# Patient Record
Sex: Female | Born: 1980
Health system: Southern US, Community
[De-identification: ages and names within clinical notes are randomized; demographics above are authoritative.]

## PROBLEM LIST (undated history)

## (undated) DIAGNOSIS — K439 Ventral hernia without obstruction or gangrene: Secondary | ICD-10-CM

## (undated) DIAGNOSIS — I219 Acute myocardial infarction, unspecified: Secondary | ICD-10-CM

## (undated) DIAGNOSIS — Z9109 Other allergy status, other than to drugs and biological substances: Secondary | ICD-10-CM

## (undated) DIAGNOSIS — M2550 Pain in unspecified joint: Secondary | ICD-10-CM

## (undated) DIAGNOSIS — R7303 Prediabetes: Secondary | ICD-10-CM

## (undated) DIAGNOSIS — G4730 Sleep apnea, unspecified: Secondary | ICD-10-CM

## (undated) DIAGNOSIS — R0602 Shortness of breath: Secondary | ICD-10-CM

## (undated) DIAGNOSIS — B019 Varicella without complication: Secondary | ICD-10-CM

## (undated) DIAGNOSIS — E282 Polycystic ovarian syndrome: Secondary | ICD-10-CM

## (undated) DIAGNOSIS — M549 Dorsalgia, unspecified: Secondary | ICD-10-CM

## (undated) DIAGNOSIS — K8020 Calculus of gallbladder without cholecystitis without obstruction: Secondary | ICD-10-CM

## (undated) DIAGNOSIS — I10 Essential (primary) hypertension: Secondary | ICD-10-CM

## (undated) DIAGNOSIS — Z8701 Personal history of pneumonia (recurrent): Secondary | ICD-10-CM

## (undated) DIAGNOSIS — J45909 Unspecified asthma, uncomplicated: Secondary | ICD-10-CM

## (undated) DIAGNOSIS — R600 Localized edema: Secondary | ICD-10-CM

## (undated) DIAGNOSIS — E559 Vitamin D deficiency, unspecified: Secondary | ICD-10-CM

## (undated) DIAGNOSIS — L732 Hidradenitis suppurativa: Secondary | ICD-10-CM

## (undated) DIAGNOSIS — T7840XA Allergy, unspecified, initial encounter: Secondary | ICD-10-CM

## (undated) DIAGNOSIS — G43909 Migraine, unspecified, not intractable, without status migrainosus: Secondary | ICD-10-CM

## (undated) HISTORY — DX: Shortness of breath: R06.02

## (undated) HISTORY — DX: Varicella without complication: B01.9

## (undated) HISTORY — DX: Pain in unspecified joint: M25.50

## (undated) HISTORY — DX: Essential (primary) hypertension: I10

## (undated) HISTORY — DX: Dorsalgia, unspecified: M54.9

## (undated) HISTORY — DX: Acute myocardial infarction, unspecified: I21.9

## (undated) HISTORY — DX: Hidradenitis suppurativa: L73.2

## (undated) HISTORY — DX: Allergy, unspecified, initial encounter: T78.40XA

## (undated) HISTORY — DX: Other allergy status, other than to drugs and biological substances: Z91.09

## (undated) HISTORY — DX: Ventral hernia without obstruction or gangrene: K43.9

## (undated) HISTORY — DX: Vitamin D deficiency, unspecified: E55.9

## (undated) HISTORY — DX: Calculus of gallbladder without cholecystitis without obstruction: K80.20

## (undated) HISTORY — DX: Localized edema: R60.0

---

## 1999-02-18 HISTORY — PX: BREAST BIOPSY: SHX20

## 2005-02-17 HISTORY — PX: CHOLECYSTECTOMY: SHX55

## 2012-04-27 ENCOUNTER — Encounter (HOSPITAL_COMMUNITY): Payer: Self-pay | Admitting: *Deleted

## 2012-04-27 ENCOUNTER — Emergency Department (HOSPITAL_COMMUNITY)
Admission: EM | Admit: 2012-04-27 | Discharge: 2012-04-28 | Disposition: A | Discharge status: Home / Self Care | Payer: BC Managed Care – PPO | Attending: Emergency Medicine | Admitting: Emergency Medicine

## 2012-04-27 DIAGNOSIS — Z8742 Personal history of other diseases of the female genital tract: Secondary | ICD-10-CM | POA: Insufficient documentation

## 2012-04-27 DIAGNOSIS — E860 Dehydration: Secondary | ICD-10-CM | POA: Insufficient documentation

## 2012-04-27 DIAGNOSIS — R197 Diarrhea, unspecified: Secondary | ICD-10-CM

## 2012-04-27 DIAGNOSIS — Z3202 Encounter for pregnancy test, result negative: Secondary | ICD-10-CM | POA: Insufficient documentation

## 2012-04-27 HISTORY — DX: Polycystic ovarian syndrome: E28.2

## 2012-04-27 LAB — COMPREHENSIVE METABOLIC PANEL
ALT: 27 U/L (ref 0–35)
AST: 22 U/L (ref 0–37)
Albumin: 3.6 g/dL (ref 3.5–5.2)
CO2: 24 mEq/L (ref 19–32)
Chloride: 100 mEq/L (ref 96–112)
Creatinine, Ser: 0.84 mg/dL (ref 0.50–1.10)
GFR calc non Af Amer: >90 mL/min (ref >90)
Sodium: 135 mEq/L (ref 135–145)
Total Bilirubin: 0.5 mg/dL (ref 0.3–1.2)

## 2012-04-27 LAB — CBC WITH DIFFERENTIAL/PLATELET
Basophils Relative: 0 % (ref 0–1)
Eosinophils Absolute: 0 10*3/uL (ref 0.0–0.7)
Eosinophils Relative: 0 % (ref 0–5)
Hemoglobin: 14.9 g/dL (ref 12.0–15.0)
Lymphs Abs: 1.7 10*3/uL (ref 0.7–4.0)
MCH: 27.7 pg (ref 26.0–34.0)
MCHC: 32.8 g/dL (ref 30.0–36.0)
MCV: 84.4 fL (ref 78.0–100.0)
Monocytes Relative: 10 % (ref 3–12)
RBC: 5.38 MIL/uL — ABNORMAL HIGH (ref 3.87–5.11)

## 2012-04-27 MED ORDER — ONDANSETRON HCL 4 MG/2ML IJ SOLN
4.0000 mg | Freq: Once | INTRAMUSCULAR | Status: AC
  Administered 2012-04-27: 4 mg via INTRAVENOUS
  Filled 2012-04-27: qty 2

## 2012-04-27 MED ORDER — LOPERAMIDE HCL 2 MG PO CAPS
2.0000 mg | ORAL_CAPSULE | Freq: Once | ORAL | Status: AC
  Administered 2012-04-27: 2 mg via ORAL
  Filled 2012-04-27: qty 1

## 2012-04-27 MED ORDER — SODIUM CHLORIDE 0.9 % IV BOLUS (SEPSIS)
1000.0000 mL | Freq: Once | INTRAVENOUS | Status: AC
  Administered 2012-04-27: 1000 mL via INTRAVENOUS

## 2012-04-27 NOTE — ED Notes (Signed)
Pt started with nausea/vomiting Sunday night; since then increased diarrhea and abd pain; no appetite

## 2012-04-27 NOTE — ED Provider Notes (Signed)
History     CSN: [id number]  Arrival date & time 04/27/12  2113   First MD Initiated Contact with Patient 04/27/12 2321      Chief Complaint  Patient presents with  . Diarrhea    (Consider location/radiation/quality/duration/timing/severity/associated sxs/prior treatment) HPI Hx per PT. Diarrhea x 2 days no appeitie,no ABD pain but has "uneasieness", no N/V, no F/C, no travel or rash, no known sick contacts, works at a call center.  MOD in severity.  PT worried she has food poisoning.  Did eat a sandwhich was mayo that she left out "too long" also ate some [employer].   Past Medical History  Diagnosis Date  . Polycystic ovarian syndrome     Past Surgical History  Procedure Laterality Date  . Cholecystectomy      No family history on file.  History  Substance Use Topics  . Smoking status: Never Smoker   . Smokeless tobacco: Not on file  . Alcohol Use: No    OB History   Grav Para Term Preterm Abortions TAB SAB Ect Mult Living                  Review of Systems  Constitutional: Negative for fever and chills.  HENT: Negative for neck pain and neck stiffness.   Eyes: Negative for pain.  Respiratory: Negative for shortness of breath.   Cardiovascular: Negative for chest pain.  Gastrointestinal: Positive for diarrhea. Negative for abdominal pain, blood in stool and abdominal distention.  Genitourinary: Negative for dysuria.  Musculoskeletal: Negative for back pain.  Skin: Negative for rash.  Neurological: Negative for headaches.  All other systems reviewed and are negative.    Allergies  Review of patient's allergies indicates no known allergies.  Home Medications  No current outpatient prescriptions on file.  BP 145/93  Pulse 100  Temp(Src) 99.1 F (37.3 C) (Oral)  Resp 20  SpO2 100%  Physical Exam  Constitutional: She is oriented to person, place, and time. She appears well-developed and well-nourished.  HENT:  Head: Normocephalic and atraumatic.   Mouth/Throat: No oropharyngeal exudate.  Mildly dry mm  Eyes: EOM are normal. Pupils are equal, round, and reactive to light. No scleral icterus.  Neck: Neck supple.  Cardiovascular: Regular rhythm and intact distal pulses.   Pulmonary/Chest: Effort normal. No respiratory distress.  Abdominal: Soft. Bowel sounds are normal. She exhibits no distension. There is no tenderness. There is no rebound and no guarding.  Musculoskeletal: Normal range of motion. She exhibits no edema and no tenderness.  Neurological: She is alert and oriented to person, place, and time. No cranial nerve deficit.  Skin: Skin is warm and dry.    ED Course  Procedures (including critical care time)  Results for orders placed during the hospital encounter of 04/27/12  COMPREHENSIVE METABOLIC PANEL      Result Value Range   Sodium 135  135 - 145 mEq/L   Potassium 3.6  3.5 - 5.1 mEq/L   Chloride 100  96 - 112 mEq/L   CO2 24  19 - 32 mEq/L   Glucose, Bld 97  70 - 99 mg/dL   BUN 10  6 - 23 mg/dL   Creatinine, Ser 1.30  0.50 - 1.10 mg/dL   Calcium 9.0  8.4 - 86.5 mg/dL   Total Protein 8.4 (*) 6.0 - 8.3 g/dL   Albumin 3.6  3.5 - 5.2 g/dL   AST 22  0 - 37 U/L   ALT 27  0 - 35  U/L   Alkaline Phosphatase 54  39 - 117 U/L   Total Bilirubin 0.5  0.3 - 1.2 mg/dL   GFR calc non Af Amer >90  >90 mL/min   GFR calc Af Amer >90  >90 mL/min  CBC WITH DIFFERENTIAL      Result Value Range   WBC 5.0  4.0 - 10.5 K/uL   RBC 5.38 (*) 3.87 - 5.11 MIL/uL   Hemoglobin 14.9  12.0 - 15.0 g/dL   HCT 16.1  09.6 - 04.5 %   MCV 84.4  78.0 - 100.0 fL   MCH 27.7  26.0 - 34.0 pg   MCHC 32.8  30.0 - 36.0 g/dL   RDW 40.9  81.1 - 91.4 %   Platelets 204  150 - 400 K/uL   Neutrophils Relative 56  43 - 77 %   Neutro Abs 2.8  1.7 - 7.7 K/uL   Lymphocytes Relative 33  12 - 46 %   Lymphs Abs 1.7  0.7 - 4.0 K/uL   Monocytes Relative 10  3 - 12 %   Monocytes Absolute 0.5  0.1 - 1.0 K/uL   Eosinophils Relative 0  0 - 5 %   Eosinophils  Absolute 0.0  0.0 - 0.7 K/uL   Basophils Relative 0  0 - 1 %   Basophils Absolute 0.0  0.0 - 0.1 K/uL   IVFs. IV zofran.  PO imodium.   PO challenge  1:58 AM feeling much better, drinking water and wants to go home. Plan outpatient follow up as needed. RX Imodium.  Return precautions provided. ABD exam unchanged  MDM  Diarrhea x 2 days.  No fevers or bloody stools  IVFs and medications provided, serial ABD exams benign  VS and nursing notes reviewed - condition improved     Sunnie Nielsen, MD 04/28/12 0202

## 2012-04-28 LAB — URINALYSIS, ROUTINE W REFLEX MICROSCOPIC
Glucose, UA: NEGATIVE mg/dL
Hgb urine dipstick: NEGATIVE
Ketones, ur: 40 mg/dL — AB
Protein, ur: 30 mg/dL — AB
pH: 5.5 (ref 5.0–8.0)

## 2012-04-28 LAB — URINE MICROSCOPIC-ADD ON

## 2012-04-28 MED ORDER — LOPERAMIDE HCL 2 MG PO CAPS: 2.0000 mg | ORAL_CAPSULE | Freq: Four times a day (QID) | ORAL | Status: DC | PRN

## 2012-06-14 DIAGNOSIS — N96 Recurrent pregnancy loss: Secondary | ICD-10-CM | POA: Insufficient documentation

## 2012-06-14 DIAGNOSIS — G43909 Migraine, unspecified, not intractable, without status migrainosus: Secondary | ICD-10-CM | POA: Insufficient documentation

## 2012-08-30 ENCOUNTER — Encounter (HOSPITAL_COMMUNITY): Payer: Self-pay | Admitting: Emergency Medicine

## 2012-08-30 ENCOUNTER — Emergency Department (HOSPITAL_COMMUNITY)
Admission: EM | Admit: 2012-08-30 | Discharge: 2012-08-30 | Disposition: A | Discharge status: Home / Self Care | Payer: BC Managed Care – PPO | Attending: Emergency Medicine | Admitting: Emergency Medicine

## 2012-08-30 DIAGNOSIS — Z8639 Personal history of other endocrine, nutritional and metabolic disease: Secondary | ICD-10-CM | POA: Insufficient documentation

## 2012-08-30 DIAGNOSIS — M545 Low back pain, unspecified: Secondary | ICD-10-CM | POA: Insufficient documentation

## 2012-08-30 DIAGNOSIS — Z862 Personal history of diseases of the blood and blood-forming organs and certain disorders involving the immune mechanism: Secondary | ICD-10-CM | POA: Insufficient documentation

## 2012-08-30 DIAGNOSIS — Z3202 Encounter for pregnancy test, result negative: Secondary | ICD-10-CM | POA: Insufficient documentation

## 2012-08-30 LAB — URINALYSIS, ROUTINE W REFLEX MICROSCOPIC
Bilirubin Urine: NEGATIVE
Glucose, UA: NEGATIVE mg/dL
Hgb urine dipstick: NEGATIVE
Ketones, ur: NEGATIVE mg/dL
Leukocytes, UA: NEGATIVE
Nitrite: NEGATIVE
Protein, ur: NEGATIVE mg/dL
Specific Gravity, Urine: 1.027 (ref 1.005–1.030)
Urobilinogen, UA: 0.2 mg/dL (ref 0.0–1.0)
pH: 6 (ref 5.0–8.0)

## 2012-08-30 LAB — POCT PREGNANCY, URINE: Preg Test, Ur: NEGATIVE

## 2012-08-30 MED ORDER — OXYCODONE-ACETAMINOPHEN 5-325 MG PO TABS: 1.0000 | ORAL_TABLET | Freq: Four times a day (QID) | ORAL | Status: DC | PRN

## 2012-08-30 MED ORDER — HYDROMORPHONE HCL PF 1 MG/ML IJ SOLN
1.0000 mg | Freq: Once | INTRAMUSCULAR | Status: DC
  Filled 2012-08-30: qty 1

## 2012-08-30 MED ORDER — IBUPROFEN 800 MG PO TABS: 800.0000 mg | ORAL_TABLET | Freq: Three times a day (TID) | ORAL | Status: DC

## 2012-08-30 MED ORDER — KETOROLAC TROMETHAMINE 60 MG/2ML IM SOLN
60.0000 mg | Freq: Once | INTRAMUSCULAR | Status: AC
  Administered 2012-08-30: 60 mg via INTRAMUSCULAR
  Filled 2012-08-30: qty 2

## 2012-08-30 NOTE — ED Notes (Signed)
Pt alert and mentating appropriately upon d/c. Pt given d/c teaching, prescriptions, and follow up care instructions. Pt verbalizes understanding of d/c teaching and has no further questions upon d/c. Pt ambulatory in room but requested wheelchair to be taken to car. Pt helped to car. Fayrene Helper, PA notified pt driving home and verifies pt stable since she was only given Toradol in ER . Pt notified not to drive when taking prescription pain medication after she fills prescription. Pt verbalizes understanding. NAD noted upon d/c.

## 2012-08-30 NOTE — ED Notes (Signed)
Pt states that she does not want to have an IV done because she does not have a ride home at this time. Pt states she would prefer to go home and try and control it there with prescription. Fayrene Helper, PA notified of pts choice.

## 2012-08-30 NOTE — ED Notes (Signed)
Pt. Stated, i have a problem with cyst on my ovaries and Its the same kind of pain it started yesterday.

## 2012-08-30 NOTE — ED Provider Notes (Signed)
History    CSN: [id number] Arrival date & time 08/30/12  5366  First MD Initiated Contact with Patient 08/30/12 [phone]     Chief Complaint  Patient presents with  . Back Pain   (Consider location/radiation/quality/duration/timing/severity/associated sxs/prior Treatment) HPI  32 year old female with history of polycystic ovarian syndrome presents complaining of back pain. Patient reports gradual onset of sharp stabbing pain to her right low back ongoing for the past 2 days. Pain is worsened with bending over, with movement and improves when she lays still. Pain is nonradiating. Pain felt very similar to prior ovarian cyst when it ruptured. States she usually has ruptured ovarian cyst once or twice yearly for the past 3 or 4 years.  This pain felt the same.  No complaints of fever, chills, headache, lightheadedness, dizziness, chest pain shortness of breath, abdominal pain, dysuria, hematuria, numbness, rash. Last menstrual period was earlier last month. Patient has no prior history of kidney stone. Denies any dysuria. Last sexual activity was 2 weeks ago.  Past Medical History  Diagnosis Date  . Polycystic ovarian syndrome    Past Surgical History  Procedure Laterality Date  . Cholecystectomy     No family history on file. History  Substance Use Topics  . Smoking status: Never Smoker   . Smokeless tobacco: Not on file  . Alcohol Use: No   OB History   Grav Para Term Preterm Abortions TAB SAB Ect Mult Living                 Review of Systems  Constitutional: Negative for fever.  Gastrointestinal: Negative for abdominal pain.  Genitourinary: Negative for flank pain.  Musculoskeletal: Positive for back pain.  Skin: Negative for rash and wound.  All other systems reviewed and are negative.    Allergies  Review of patient's allergies indicates no known allergies.  Home Medications   Current Outpatient Rx  Name  Route  Sig  Dispense  Refill  . albuterol (PROVENTIL  HFA;VENTOLIN HFA) 108 (90 BASE) MCG/ACT inhaler   Inhalation   Inhale 2 puffs into the lungs every 6 (six) hours as needed for wheezing.          BP 149/71  Pulse 85  Temp(Src) 98.2 F (36.8 C) (Oral)  Resp 17  Ht 5\' 5"  (1.651 m)  Wt 300 lb (136.079 kg)  BMI 49.92 kg/m2  SpO2 96%  LMP 07/18/2012 Physical Exam  Nursing note and vitals reviewed. Constitutional: She is oriented to person, place, and time. She appears well-developed and well-nourished. No distress.  Patient is morbidly obese  HENT:  Head: Normocephalic and atraumatic.  Eyes: Conjunctivae are normal.  Neck: Neck supple.  Abdominal: Soft. There is tenderness (Tenderness to right low back with palpation of abdomen, otherwise no significant focal point tenderness on abdominal exam).  Genitourinary:  No CVA tenderness   Neurological: She is alert and oriented to person, place, and time.  Skin: Skin is warm. No rash noted.  Psychiatric: She has a normal mood and affect.    ED Course  Procedures (including critical care time)  11:39 AM Pt with low back pain similar to ovarian cyst pain.  Pain is improving after receiving toradol.  preg neg, UA unremarkable.   I discussed care with attending and with patient.  Since pt felt pain is related to ovarian cyst, we will treat pain conservatively.  Pt discharge with pain meds.  However, if sxs worsen, pt has fever, n/v, or seeing blood in urine  then she should return promptly for further evaluation.  Pt agrees with plan.     Labs Reviewed  URINALYSIS, ROUTINE W REFLEX MICROSCOPIC - Abnormal; Notable for the following:    APPearance CLOUDY (*)    All other components within normal limits  POCT PREGNANCY, URINE   No results found. 1. Lower back pain     MDM  BP 111/90  Pulse 83  Temp(Src) 98.2 F (36.8 C) (Oral)  Resp 17  Ht 5\' 5"  (1.651 m)  Wt 300 lb (136.079 kg)  BMI 49.92 kg/m2  SpO2 100%  LMP 07/18/2012    Fayrene Helper, PA-C 08/30/12 1211  Fayrene Helper,  PA-C 08/30/12 1213

## 2012-08-31 NOTE — ED Provider Notes (Signed)
Medical screening examination/treatment/procedure(s) were performed by non-physician practitioner and as supervising physician I was immediately available for consultation/collaboration.  Toy Baker, MD 08/31/12 [phone]

## 2012-10-17 ENCOUNTER — Encounter (HOSPITAL_COMMUNITY): Payer: Self-pay | Admitting: Emergency Medicine

## 2012-10-17 ENCOUNTER — Emergency Department (HOSPITAL_COMMUNITY)
Admission: EM | Admit: 2012-10-17 | Discharge: 2012-10-17 | Disposition: A | Discharge status: Home / Self Care | Payer: BC Managed Care – PPO | Source: Home / Self Care

## 2012-10-17 DIAGNOSIS — N61 Mastitis without abscess: Secondary | ICD-10-CM

## 2012-10-17 DIAGNOSIS — B372 Candidiasis of skin and nail: Secondary | ICD-10-CM

## 2012-10-17 DIAGNOSIS — N611 Abscess of the breast and nipple: Secondary | ICD-10-CM

## 2012-10-17 MED ORDER — SULFAMETHOXAZOLE-TRIMETHOPRIM 800-160 MG PO TABS: 1.0000 | ORAL_TABLET | Freq: Two times a day (BID) | ORAL | Status: DC

## 2012-10-17 MED ORDER — NYSTATIN 100000 UNIT/GM EX POWD: CUTANEOUS | Status: DC

## 2012-10-17 NOTE — ED Provider Notes (Signed)
Medical screening examination/treatment/procedure(s) were performed by non-physician practitioner and as supervising physician I was immediately available for consultation/collaboration.  Leslee Home, M.D.  Reuben Likes, MD 10/17/12 [phone]

## 2012-10-17 NOTE — ED Notes (Signed)
C/o abscess under left breast that has busted and started with a clear mucous draining for a week now.   Antibiotic band aid was place on the abscess.

## 2012-10-17 NOTE — ED Provider Notes (Signed)
CSN: [id number]     Arrival date & time 10/17/12  1610 History   First MD Initiated Contact with Patient 10/17/12 1628     Chief Complaint  Patient presents with  . Abscess   (Consider location/radiation/quality/duration/timing/severity/associated sxs/prior Treatment) HPI Comments: 32 year old severely obese female presents with a persistent area of drainage in the intertriginous folds of the left breast. She states there was a small abscess-like structure discovered there approximately 2 weeks ago. After about one week spontaneously ruptured and has been draining ever since. She has been trying to cover the small opening with Band-Aids and paper towels. Denies systemic symptoms.   Past Medical History  Diagnosis Date  . Polycystic ovarian syndrome    Past Surgical History  Procedure Laterality Date  . Cholecystectomy     History reviewed. No pertinent family history. History  Substance Use Topics  . Smoking status: Never Smoker   . Smokeless tobacco: Not on file  . Alcohol Use: No   OB History   Grav Para Term Preterm Abortions TAB SAB Ect Mult Living                 Review of Systems  Skin:       As per history of present illness  All other systems reviewed and are negative.    Allergies  Review of patient's allergies indicates no known allergies.  Home Medications   Current Outpatient Rx  Name  Route  Sig  Dispense  Refill  . albuterol (PROVENTIL HFA;VENTOLIN HFA) 108 (90 BASE) MCG/ACT inhaler   Inhalation   Inhale 2 puffs into the lungs every 6 (six) hours as needed for wheezing.         Marland Kitchen ibuprofen (ADVIL,MOTRIN) 800 MG tablet   Oral   Take 1 tablet (800 mg total) by mouth 3 (three) times daily.   21 tablet   0   . oxyCODONE-acetaminophen (PERCOCET/ROXICET) 5-325 MG per tablet   Oral   Take 1-2 tablets by mouth every 6 (six) hours as needed for pain.   20 tablet   0    BP 136/88  Pulse 89  Temp(Src) 98.5 F (36.9 C) (Oral)  Resp 18  SpO2 100%   LMP 09/28/2012 Physical Exam  Nursing note and vitals reviewed. Constitutional: She is oriented to person, place, and time. She appears well-developed and well-nourished. No distress.  Neck: Normal range of motion. Neck supple.  Pulmonary/Chest: Effort normal.  Musculoskeletal: She exhibits no edema.  Neurological: She is alert and oriented to person, place, and time. She exhibits normal muscle tone.  Skin: Skin is warm and dry. No rash noted.  There is a 2 mm superficial skin opening in the mid intertriginous fold beneath the left breast. There is no apparent active draining. Expressing the edges a scant amount of tan fluid is seen. The area is mildly tender. No fluctuation, bulging of underlying tissues, induration or erythema. No signs of an abscess. There is also a slimy colorless female beneath the breast consistent with Candida.  Psychiatric: She has a normal mood and affect.    ED Course  Procedures (including critical care time) Labs Review Labs Reviewed - No data to display Imaging Review No results found.  MDM   1. Abscess of breast   2. Monilial intertrigo     : Nystatin powder apply to skin creases beneath the breasts bilaterally. Septra DS twice a day for 7 days Keep the area dry and place dry and sort of material over  the small area of drainage. Obtained culture. It was only a scant amount of drainage expressed and this may not be sufficient for culture. Recheck promptly for any new symptoms problems worsening or not improving or may call your PCP.   Hayden Rasmussen, NP 10/17/12 1721

## 2012-10-20 LAB — CULTURE, ROUTINE-ABSCESS: Gram Stain: NONE SEEN

## 2013-02-17 DIAGNOSIS — J189 Pneumonia, unspecified organism: Secondary | ICD-10-CM

## 2013-02-17 DIAGNOSIS — Z8679 Personal history of other diseases of the circulatory system: Secondary | ICD-10-CM

## 2013-02-17 HISTORY — DX: Personal history of other diseases of the circulatory system: Z86.79

## 2013-02-17 HISTORY — DX: Pneumonia, unspecified organism: J18.9

## 2013-03-06 ENCOUNTER — Emergency Department (HOSPITAL_BASED_OUTPATIENT_CLINIC_OR_DEPARTMENT_OTHER): Payer: BC Managed Care – PPO

## 2013-03-06 ENCOUNTER — Encounter (HOSPITAL_BASED_OUTPATIENT_CLINIC_OR_DEPARTMENT_OTHER): Payer: Self-pay | Admitting: Emergency Medicine

## 2013-03-06 ENCOUNTER — Emergency Department (HOSPITAL_BASED_OUTPATIENT_CLINIC_OR_DEPARTMENT_OTHER)
Admission: EM | Admit: 2013-03-06 | Discharge: 2013-03-07 | Disposition: A | Discharge status: Home / Self Care | Payer: BC Managed Care – PPO | Attending: Emergency Medicine | Admitting: Emergency Medicine

## 2013-03-06 DIAGNOSIS — Z792 Long term (current) use of antibiotics: Secondary | ICD-10-CM | POA: Insufficient documentation

## 2013-03-06 DIAGNOSIS — Z862 Personal history of diseases of the blood and blood-forming organs and certain disorders involving the immune mechanism: Secondary | ICD-10-CM | POA: Insufficient documentation

## 2013-03-06 DIAGNOSIS — Z8639 Personal history of other endocrine, nutritional and metabolic disease: Secondary | ICD-10-CM | POA: Insufficient documentation

## 2013-03-06 DIAGNOSIS — Z791 Long term (current) use of non-steroidal anti-inflammatories (NSAID): Secondary | ICD-10-CM | POA: Insufficient documentation

## 2013-03-06 DIAGNOSIS — J45901 Unspecified asthma with (acute) exacerbation: Secondary | ICD-10-CM

## 2013-03-06 HISTORY — DX: Unspecified asthma, uncomplicated: J45.909

## 2013-03-06 LAB — CBC WITH DIFFERENTIAL/PLATELET
BASOS PCT: 0 % (ref 0–1)
Basophils Absolute: 0 10*3/uL (ref 0.0–0.1)
EOS ABS: 0.1 10*3/uL (ref 0.0–0.7)
Eosinophils Relative: 1 % (ref 0–5)
HCT: 38.6 % (ref 36.0–46.0)
HEMOGLOBIN: 12.2 g/dL (ref 12.0–15.0)
Lymphocytes Relative: 27 % (ref 12–46)
Lymphs Abs: 2.3 10*3/uL (ref 0.7–4.0)
MCH: 26.8 pg (ref 26.0–34.0)
MCHC: 31.6 g/dL (ref 30.0–36.0)
MCV: 84.6 fL (ref 78.0–100.0)
MONOS PCT: 8 % (ref 3–12)
Monocytes Absolute: 0.7 10*3/uL (ref 0.1–1.0)
Neutro Abs: 5.4 10*3/uL (ref 1.7–7.7)
Neutrophils Relative %: 64 % (ref 43–77)
PLATELETS: 176 10*3/uL (ref 150–400)
RBC: 4.56 MIL/uL (ref 3.87–5.11)
RDW: 14 % (ref 11.5–15.5)
WBC: 8.5 10*3/uL (ref 4.0–10.5)

## 2013-03-06 LAB — BASIC METABOLIC PANEL
BUN: 10 mg/dL (ref 6–23)
CALCIUM: 9 mg/dL (ref 8.4–10.5)
CO2: 24 mEq/L (ref 19–32)
Chloride: 100 mEq/L (ref 96–112)
Creatinine, Ser: 0.8 mg/dL (ref 0.50–1.10)
GFR calc Af Amer: >90 mL/min (ref >90)
Glucose, Bld: 116 mg/dL — ABNORMAL HIGH (ref 70–99)
POTASSIUM: 3.7 meq/L (ref 3.7–5.3)
Sodium: 140 mEq/L (ref 137–147)

## 2013-03-06 LAB — D-DIMER, QUANTITATIVE: D-Dimer, Quant: 0.29 ug/mL-FEU (ref 0.00–0.48)

## 2013-03-06 LAB — PRO B NATRIURETIC PEPTIDE: Pro B Natriuretic peptide (BNP): 31.1 pg/mL (ref 0–125)

## 2013-03-06 MED ORDER — ALBUTEROL SULFATE (2.5 MG/3ML) 0.083% IN NEBU
5.0000 mg | INHALATION_SOLUTION | Freq: Once | RESPIRATORY_TRACT | Status: AC
  Administered 2013-03-06: 5 mg via RESPIRATORY_TRACT
  Filled 2013-03-06: qty 6

## 2013-03-06 MED ORDER — METHYLPREDNISOLONE SODIUM SUCC 125 MG IJ SOLR
125.0000 mg | Freq: Once | INTRAMUSCULAR | Status: AC
Start: 2013-03-06 — End: 2013-03-06
  Administered 2013-03-06: 125 mg via INTRAVENOUS
  Filled 2013-03-06: qty 2

## 2013-03-06 MED ORDER — ALBUTEROL SULFATE (2.5 MG/3ML) 0.083% IN NEBU
2.5000 mg | INHALATION_SOLUTION | Freq: Once | RESPIRATORY_TRACT | Status: AC
Start: 2013-03-06 — End: 2013-03-06
  Administered 2013-03-06: 2.5 mg via RESPIRATORY_TRACT
  Filled 2013-03-06: qty 3

## 2013-03-06 MED ORDER — PREDNISONE 10 MG PO TABS: 20.0000 mg | ORAL_TABLET | Freq: Every day | ORAL | Status: DC

## 2013-03-06 MED ORDER — FUROSEMIDE 10 MG/ML IJ SOLN
60.0000 mg | Freq: Once | INTRAMUSCULAR | Status: AC
  Administered 2013-03-06: 60 mg via INTRAVENOUS
  Filled 2013-03-06: qty 6

## 2013-03-06 MED ORDER — IPRATROPIUM-ALBUTEROL 0.5-2.5 (3) MG/3ML IN SOLN
3.0000 mL | Freq: Once | RESPIRATORY_TRACT | Status: AC
  Administered 2013-03-06: 3 mL via RESPIRATORY_TRACT
  Filled 2013-03-06: qty 3

## 2013-03-06 NOTE — Progress Notes (Signed)
Patient states that she is able to take a deeper breath post nebulizer treatment but not a full breath.

## 2013-03-06 NOTE — ED Provider Notes (Signed)
CSN: [id number]     Arrival date & time 03/06/13  1917 History   First MD Initiated Contact with Patient 03/06/13 2212    This chart was scribed for Melissa Pollack, MD by Lovena Le Day, ED scribe. This patient was seen in room MH06/MH06 and the patient's care was started at 2212.  Chief Complaint  Patient presents with  . Asthma   The history is provided by the patient. No language interpreter was used.   HPI Comments: Melissa Erickson is a 33 y.o. female who presents to the Emergency Department w/hx of asthma complaining of constant, gradually worsened coughing and wheezing, onset this PM. She reports previous flare ups of her asthma have not been this severe. She reports felt somewhat improved after breathing tx for a few minutes but began feeling more dyspneic again. She denies lower leg swelling or hx of blood clots. She has no menstrual cycles.   She has a hx of sleep apnea and polycystic ovarian disease.  Past Medical History  Diagnosis Date  . Polycystic ovarian syndrome   . Asthma    Past Surgical History  Procedure Laterality Date  . Cholecystectomy     History reviewed. No pertinent family history. History  Substance Use Topics  . Smoking status: Never Smoker   . Smokeless tobacco: Not on file  . Alcohol Use: No   OB History   Grav Para Term Preterm Abortions TAB SAB Ect Mult Living                 Review of Systems  Constitutional: Negative for fever and chills.  Respiratory: Positive for cough, shortness of breath and wheezing.   Cardiovascular: Negative for chest pain.  Gastrointestinal: Negative for abdominal pain.  Musculoskeletal: Negative for back pain.  All other systems reviewed and are negative.   A complete 10 system review of systems was obtained and all systems are negative except as noted in the HPI and PMH.   Allergies  Review of patient's allergies indicates no known allergies.  Home Medications   Current Outpatient Rx  Name  Route  Sig   Dispense  Refill  . albuterol (PROVENTIL HFA;VENTOLIN HFA) 108 (90 BASE) MCG/ACT inhaler   Inhalation   Inhale 2 puffs into the lungs every 6 (six) hours as needed for wheezing.         Marland Kitchen ibuprofen (ADVIL,MOTRIN) 800 MG tablet   Oral   Take 1 tablet (800 mg total) by mouth 3 (three) times daily.   21 tablet   0   . nystatin (MYCOSTATIN) powder      Apply between skin folds of breasts qid   30 g   0   . oxyCODONE-acetaminophen (PERCOCET/ROXICET) 5-325 MG per tablet   Oral   Take 1-2 tablets by mouth every 6 (six) hours as needed for pain.   20 tablet   0   . sulfamethoxazole-trimethoprim (SEPTRA DS) 800-160 MG per tablet   Oral   Take 1 tablet by mouth 2 (two) times daily. X 7 days   14 tablet   0    Triage Vitals: BP 144/99  Pulse 103  Temp(Src) 98.3 F (36.8 C) (Oral)  Resp 26  Ht 5\' 5"  (1.651 m)  Wt 390 lb (176.903 kg)  BMI 64.90 kg/m2  SpO2 99% Physical Exam  Nursing note and vitals reviewed. Constitutional: She is oriented to person, place, and time. She appears well-developed and well-nourished. No distress.  HENT:  Head: Normocephalic and atraumatic.  Eyes: Conjunctivae are normal. Right eye exhibits no discharge. Left eye exhibits no discharge.  Neck: Normal range of motion.  Cardiovascular: Normal rate.   Pulmonary/Chest: Effort normal. No respiratory distress.  Musculoskeletal: Normal range of motion. She exhibits no edema.  Neurological: She is alert and oriented to person, place, and time.  Skin: Skin is warm and dry.  Psychiatric: She has a normal mood and affect. Thought content normal.    ED Course  Procedures (including critical care time) DIAGNOSTIC STUDIES: Oxygen Saturation is 100% on room air, normal by my interpretation.    COORDINATION OF CARE: At 1005 PM Discussed treatment plan with patient which includes breathing tx, CXR, solu-medrol, blood work, EKG. Patient agrees.   Labs Review Labs Reviewed  PRO B NATRIURETIC PEPTIDE   D-DIMER, QUANTITATIVE  CBC WITH DIFFERENTIAL  BASIC METABOLIC PANEL   Imaging Review Dg Chest 2 View  03/06/2013   CLINICAL DATA:  Shortness of breath and wheezing. History of asthma.  EXAM: CHEST  2 VIEW  COMPARISON:  None.  FINDINGS: The lungs are well-aerated. Mild vascular congestion is noted, with question of minimal interstitial edema. There is no evidence of pleural effusion or pneumothorax.  The heart is borderline normal in size; the mediastinal contour is within normal limits. No acute osseous abnormalities are seen.  IMPRESSION: Mild vascular congestion, with question of minimal interstitial edema.   Electronically Signed   By: Garald Balding M.D.   On: 03/06/2013 21:20   EKG Interpretation   None      MDM  Patient improved here after steroids, nebs, and some lasix.  She is no longer wheezing.  I have discussed return precautions and she voices understanding.    Melissa Pollack, MD 03/06/13 [phone]

## 2013-03-06 NOTE — Discharge Instructions (Signed)
Asthma, Adult Asthma is a recurring condition in which the airways tighten and narrow. Asthma can make it difficult to breathe. It can cause coughing, wheezing, and shortness of breath. Asthma episodes (also called asthma attacks) range from minor to life-threatening. Asthma cannot be cured, but medicines and lifestyle changes can help control it. CAUSES Asthma is believed to be caused by inherited (genetic) and environmental factors, but its exact cause is unknown. Asthma may be triggered by allergens, lung infections, or irritants in the air. Asthma triggers are different for each person. Common triggers include:   Animal dander.  Dust mites.  Cockroaches.  Pollen from trees or grass.  Mold.  Smoke.  Air pollutants such as dust, household cleaners, hair sprays, aerosol sprays, paint fumes, strong chemicals, or strong odors.  Cold air, weather changes, and winds (which increase molds and pollens in the air).  Strong emotional expressions such as crying or laughing hard.  Stress.  Certain medicines (such as aspirin) or types of drugs (such as beta-blockers).  Sulfites in foods and drinks. Foods and drinks that may contain sulfites include dried fruit, potato chips, and sparkling grape juice.  Infections or inflammatory conditions such as the flu, a cold, or an inflammation of the nasal membranes (rhinitis).  Gastroesophageal reflux disease (GERD).  Exercise or strenuous activity. SYMPTOMS Symptoms may occur immediately after asthma is triggered or many hours later. Symptoms include:  Wheezing.  Excessive nighttime or early morning coughing.  Frequent or severe coughing with a common cold.  Chest tightness.  Shortness of breath. DIAGNOSIS  The diagnosis of asthma is made by a review of your medical history and a physical exam. Tests may also be performed. These may include:  Lung function studies. These tests show how much air you breath in and out.  Allergy  tests.  Imaging tests such as X-rays. TREATMENT  Asthma cannot be cured, but it can usually be controlled. Treatment involves identifying and avoiding your asthma triggers. It also involves medicines. There are 2 classes of medicine used for asthma treatment:   Controller medicines. These prevent asthma symptoms from occurring. They are usually taken every day.  Reliever or rescue medicines. These quickly relieve asthma symptoms. They are used as needed and provide short-term relief. Your health care provider will help you create an asthma action plan. An asthma action plan is a written plan for managing and treating your asthma attacks. It includes a list of your asthma triggers and how they may be avoided. It also includes information on when medicines should be taken and when their dosage should be changed. An action plan may also involve the use of a device called a peak flow meter. A peak flow meter measures how well the lungs are working. It helps you monitor your condition. HOME CARE INSTRUCTIONS   Take medicine as directed by your health care provider. Speak with your health care provider if you have questions about how or when to take the medicines.  Use a peak flow meter as directed by your health care provider. Record and keep track of readings.  Understand and use the action plan to help minimize or stop an asthma attack without needing to seek medical care.  Control your home environment in the following ways to help prevent asthma attacks:  Do not smoke. Avoid being exposed to secondhand smoke.  Change your heating and air conditioning filter regularly.  Limit your use of fireplaces and wood stoves.  Get rid of pests (such as roaches and   mice) and their droppings.  Throw away plants if you see mold on them.  Clean your floors and dust regularly. Use unscented cleaning products.  Try to have someone else vacuum for you regularly. Stay out of rooms while they are being  vacuumed and for a short while afterward. If you vacuum, use a dust mask from a hardware store, a double-layered or microfilter vacuum cleaner bag, or a vacuum cleaner with a HEPA filter.  Replace carpet with wood, tile, or vinyl flooring. Carpet can trap dander and dust.  Use allergy-proof pillows, mattress covers, and box spring covers.  Wash bed sheets and blankets every week in hot water and dry them in a dryer.  Use blankets that are made of polyester or cotton.  Clean bathrooms and kitchens with bleach. If possible, have someone repaint the walls in these rooms with mold-resistant paint. Keep out of the rooms that are being cleaned and painted.  Wash hands frequently. SEEK MEDICAL CARE IF:   You have wheezing, shortness of breath, or a cough even if taking medicine to prevent attacks.  The colored mucus you cough up (sputum) is thicker than usual.  Your sputum changes from clear or white to yellow, green, gray, or bloody.  You have any problems that may be related to the medicines you are taking (such as a rash, itching, swelling, or trouble breathing).  You are using a reliever medicine more than 2 3 times per week.  Your peak flow is still at 50 79% of you personal best after following your action plan for 1 hour. SEEK IMMEDIATE MEDICAL CARE IF:   You seem to be getting worse and are unresponsive to treatment during an asthma attack.  You are short of breath even at rest.  You get short of breath when doing very little physical activity.  You have difficulty eating, drinking, or talking due to asthma symptoms.  You develop chest pain.  You develop a fast heartbeat.  You have a bluish color to your lips or fingernails.  You are lightheaded, dizzy, or faint.  Your peak flow is less than 50% of your personal best.  You have a fever or persistent symptoms for more than 2 3 days.  You have a fever and symptoms suddenly get worse. MAKE SURE YOU:   Understand these  instructions.  Will watch your condition.  Will get help right away if you are not doing well or get worse. Document Released: 02/03/2005 Document Revised: 10/06/2012 Document Reviewed: 09/02/2012 [employer] Patient Information 2014 [employer].  

## 2013-03-06 NOTE — ED Notes (Addendum)
Pt reports onset SOB hx asthma audible wheezing and moderate retractions noted pt states she tried her inhalers at home resulting in headache but no change in breathing

## 2013-03-06 NOTE — ED Notes (Signed)
Registration notified me they thought patient was back in distress with breathing since respiratory treatment. I took patient vitals with Dynomat in waiting room. O2 saturation was 100 % room air, BP of 152/89 and heart rate of 102. I notified triage nurse.

## 2013-03-07 MED ORDER — ALBUTEROL SULFATE HFA 108 (90 BASE) MCG/ACT IN AERS
1.0000 | INHALATION_SPRAY | RESPIRATORY_TRACT | Status: DC
  Administered 2013-03-07: 1 via RESPIRATORY_TRACT
  Filled 2013-03-07: qty 6.7

## 2013-03-11 DIAGNOSIS — J4530 Mild persistent asthma, uncomplicated: Secondary | ICD-10-CM | POA: Insufficient documentation

## 2013-03-11 DIAGNOSIS — J4520 Mild intermittent asthma, uncomplicated: Secondary | ICD-10-CM | POA: Insufficient documentation

## 2013-06-07 ENCOUNTER — Emergency Department (HOSPITAL_BASED_OUTPATIENT_CLINIC_OR_DEPARTMENT_OTHER)
Admission: EM | Admit: 2013-06-07 | Discharge: 2013-06-07 | Disposition: A | Discharge status: Home / Self Care | Payer: BC Managed Care – PPO | Attending: Emergency Medicine | Admitting: Emergency Medicine

## 2013-06-07 ENCOUNTER — Encounter (HOSPITAL_BASED_OUTPATIENT_CLINIC_OR_DEPARTMENT_OTHER): Payer: Self-pay | Admitting: Emergency Medicine

## 2013-06-07 DIAGNOSIS — Z79899 Other long term (current) drug therapy: Secondary | ICD-10-CM | POA: Insufficient documentation

## 2013-06-07 DIAGNOSIS — Z8742 Personal history of other diseases of the female genital tract: Secondary | ICD-10-CM | POA: Insufficient documentation

## 2013-06-07 DIAGNOSIS — Z3202 Encounter for pregnancy test, result negative: Secondary | ICD-10-CM | POA: Insufficient documentation

## 2013-06-07 DIAGNOSIS — M549 Dorsalgia, unspecified: Secondary | ICD-10-CM

## 2013-06-07 DIAGNOSIS — Z792 Long term (current) use of antibiotics: Secondary | ICD-10-CM | POA: Insufficient documentation

## 2013-06-07 DIAGNOSIS — M546 Pain in thoracic spine: Secondary | ICD-10-CM | POA: Insufficient documentation

## 2013-06-07 DIAGNOSIS — J45909 Unspecified asthma, uncomplicated: Secondary | ICD-10-CM | POA: Insufficient documentation

## 2013-06-07 DIAGNOSIS — IMO0002 Reserved for concepts with insufficient information to code with codable children: Secondary | ICD-10-CM | POA: Insufficient documentation

## 2013-06-07 DIAGNOSIS — Z791 Long term (current) use of non-steroidal anti-inflammatories (NSAID): Secondary | ICD-10-CM | POA: Insufficient documentation

## 2013-06-07 DIAGNOSIS — R109 Unspecified abdominal pain: Secondary | ICD-10-CM | POA: Insufficient documentation

## 2013-06-07 LAB — URINE MICROSCOPIC-ADD ON

## 2013-06-07 LAB — URINALYSIS, ROUTINE W REFLEX MICROSCOPIC
BILIRUBIN URINE: NEGATIVE
Glucose, UA: NEGATIVE mg/dL
Ketones, ur: NEGATIVE mg/dL
Leukocytes, UA: NEGATIVE
NITRITE: NEGATIVE
PROTEIN: NEGATIVE mg/dL
SPECIFIC GRAVITY, URINE: 1.031 — AB (ref 1.005–1.030)
UROBILINOGEN UA: 0.2 mg/dL (ref 0.0–1.0)
pH: 5.5 (ref 5.0–8.0)

## 2013-06-07 LAB — PREGNANCY, URINE: Preg Test, Ur: NEGATIVE

## 2013-06-07 MED ORDER — OXYCODONE-ACETAMINOPHEN 5-325 MG PO TABS: 1.0000 | ORAL_TABLET | Freq: Four times a day (QID) | ORAL | Status: DC | PRN

## 2013-06-07 NOTE — Discharge Instructions (Signed)
Back Pain, Adult Low back pain is very common. About 1 in 5 people have back pain.The cause of low back pain is rarely dangerous. The pain often gets better over time.About half of people with a sudden onset of back pain feel better in just 2 weeks. About 8 in 10 people feel better by 6 weeks.  CAUSES Some common causes of back pain include:  Strain of the muscles or ligaments supporting the spine.  Wear and tear (degeneration) of the spinal discs.  Arthritis.  Direct injury to the back. DIAGNOSIS Most of the time, the direct cause of low back pain is not known.However, back pain can be treated effectively even when the exact cause of the pain is unknown.Answering your caregiver's questions about your overall health and symptoms is one of the most accurate ways to make sure the cause of your pain is not dangerous. If your caregiver needs more information, he or she may order lab work or imaging tests (X-rays or MRIs).However, even if imaging tests show changes in your back, this usually does not require surgery. HOME CARE INSTRUCTIONS For many people, back pain returns.Since low back pain is rarely dangerous, it is often a condition that people can learn to manageon their own.   Remain active. It is stressful on the back to sit or stand in one place. Do not sit, drive, or stand in one place for more than 30 minutes at a time. Take short walks on level surfaces as soon as pain allows.Try to increase the length of time you walk each day.  Do not stay in bed.Resting more than 1 or 2 days can delay your recovery.  Do not avoid exercise or work.Your body is made to move.It is not dangerous to be active, even though your back may hurt.Your back will likely heal faster if you return to being active before your pain is gone.  Pay attention to your body when you bend and lift. Many people have less discomfortwhen lifting if they bend their knees, keep the load close to their bodies,and  avoid twisting. Often, the most comfortable positions are those that put less stress on your recovering back.  Find a comfortable position to sleep. Use a firm mattress and lie on your side with your knees slightly bent. If you lie on your back, put a pillow under your knees.  Only take over-the-counter or prescription medicines as directed by your caregiver. Over-the-counter medicines to reduce pain and inflammation are often the most helpful.Your caregiver may prescribe muscle relaxant drugs.These medicines help dull your pain so you can more quickly return to your normal activities and healthy exercise.  Put ice on the injured area.  Put ice in a plastic bag.  Place a towel between your skin and the bag.  Leave the ice on for 15-20 minutes, 03-04 times a day for the first 2 to 3 days. After that, ice and heat may be alternated to reduce pain and spasms.  Ask your caregiver about trying back exercises and gentle massage. This may be of some benefit.  Avoid feeling anxious or stressed.Stress increases muscle tension and can worsen back pain.It is important to recognize when you are anxious or stressed and learn ways to manage it.Exercise is a great option. SEEK MEDICAL CARE IF:  You have pain that is not relieved with rest or medicine.  You have pain that does not improve in 1 week.  You have new symptoms.  You are generally not feeling well. SEEK   IMMEDIATE MEDICAL CARE IF:   You have pain that radiates from your back into your legs.  You develop new bowel or bladder control problems.  You have unusual weakness or numbness in your arms or legs.  You develop nausea or vomiting.  You develop abdominal pain.  You feel faint. Document Released: 02/03/2005 Document Revised: 08/05/2011 Document Reviewed: 06/24/2010 [employer] Patient Information 2014 [employer].  

## 2013-06-07 NOTE — ED Notes (Signed)
C/o "burning pain to my back" x 1 week-pain to right side abd x 2 days

## 2013-06-07 NOTE — ED Provider Notes (Addendum)
CSN: [id number]     Arrival date & time 06/07/13  1747 History   First MD Initiated Contact with Patient 06/07/13 1800     Chief Complaint  Patient presents with  . Back Pain     (Consider location/radiation/quality/duration/timing/severity/associated sxs/prior Treatment) Patient is a 33 y.o. female presenting with back pain. The history is provided by the patient. No language interpreter was used.  Back Pain Location:  Thoracic spine Quality:  Aching and stabbing Radiates to:  R thigh Pain severity:  Moderate Associated symptoms: no abdominal pain, no dysuria, no fever and no pelvic pain   Associated symptoms comment:  Pain in right flank that started as burning type pain yesterday becoming sharp radiating pain today extending into right thigh. No weakness, dysuria, fever, nausea or vomiting.    Past Medical History  Diagnosis Date  . Polycystic ovarian syndrome   . Asthma    Past Surgical History  Procedure Laterality Date  . Cholecystectomy    . Cesarean section     No family history on file. History  Substance Use Topics  . Smoking status: Never Smoker   . Smokeless tobacco: Not on file  . Alcohol Use: No   OB History   Grav Para Term Preterm Abortions TAB SAB Ect Mult Living                 Review of Systems  Constitutional: Negative for fever and chills.  Respiratory: Negative.   Gastrointestinal: Negative.  Negative for nausea, vomiting and abdominal pain.  Genitourinary: Positive for flank pain. Negative for dysuria, hematuria and pelvic pain.       H/O PCOS but does not feel pain in pelvic typical of ovarian pain.  Musculoskeletal: Positive for back pain.  Skin: Negative.   Neurological: Negative.       Allergies  Review of patient's allergies indicates no known allergies.  Home Medications   Prior to Admission medications   Medication Sig Start Date End Date Taking? Authorizing Provider  albuterol (PROVENTIL HFA;VENTOLIN HFA) 108 (90 BASE)  MCG/ACT inhaler Inhale 2 puffs into the lungs every 6 (six) hours as needed for wheezing.    Historical Provider, MD  ibuprofen (ADVIL,MOTRIN) 800 MG tablet Take 1 tablet (800 mg total) by mouth 3 (three) times daily. 08/30/12   Domenic Moras, PA-C  nystatin (MYCOSTATIN) powder Apply between skin folds of breasts qid 10/17/12   Janne Napoleon, NP  oxyCODONE-acetaminophen (PERCOCET/ROXICET) 5-325 MG per tablet Take 1-2 tablets by mouth every 6 (six) hours as needed. 06/07/13   Ranveer Wahlstrom A Ina Poupard, PA-C  predniSONE (DELTASONE) 10 MG tablet Take 2 tablets (20 mg total) by mouth daily. 03/06/13   [name], MD  sulfamethoxazole-trimethoprim (SEPTRA DS) 800-160 MG per tablet Take 1 tablet by mouth 2 (two) times daily. X 7 days 10/17/12   Janne Napoleon, NP   BP 146/95  Pulse 92  Temp(Src) 98.2 F (36.8 C) (Oral)  Resp 20  Ht 5\' 4"  (1.626 m)  Wt 402 lb (182.346 kg)  BMI 68.97 kg/m2  SpO2 100% Physical Exam  Constitutional: She is oriented to person, place, and time. She appears well-developed and well-nourished.  HENT:  Head: Normocephalic.  Neck: Normal range of motion. Neck supple.  Cardiovascular: Normal rate and regular rhythm.   Pulmonary/Chest: Effort normal and breath sounds normal. She has no wheezes. She has no rales.  Abdominal: Soft. Bowel sounds are normal. There is no tenderness. There is no rebound and no guarding.  Musculoskeletal: Normal range  of motion.  Mild thoracic back tenderness without swelling or discoloration.  Neurological: She is alert and oriented to person, place, and time. She has normal reflexes. Coordination normal.  Skin: Skin is warm and dry. No rash noted.  Psychiatric: She has a normal mood and affect.    ED Course  Procedures (including critical care time) Labs Review Labs Reviewed  URINALYSIS, ROUTINE W REFLEX MICROSCOPIC - Abnormal; Notable for the following:    Specific Gravity, Urine 1.031 (*)    Hgb urine dipstick TRACE (*)    All other components within  normal limits  PREGNANCY, URINE  URINE MICROSCOPIC-ADD ON   Results for orders placed during the hospital encounter of 06/07/13  URINALYSIS, ROUTINE W REFLEX MICROSCOPIC      Result Value Ref Range   Color, Urine YELLOW  YELLOW   APPearance CLEAR  CLEAR   Specific Gravity, Urine 1.031 (*) 1.005 - 1.030   pH 5.5  5.0 - 8.0   Glucose, UA NEGATIVE  NEGATIVE mg/dL   Hgb urine dipstick TRACE (*) NEGATIVE   Bilirubin Urine NEGATIVE  NEGATIVE   Ketones, ur NEGATIVE  NEGATIVE mg/dL   Protein, ur NEGATIVE  NEGATIVE mg/dL   Urobilinogen, UA 0.2  0.0 - 1.0 mg/dL   Nitrite NEGATIVE  NEGATIVE   Leukocytes, UA NEGATIVE  NEGATIVE  PREGNANCY, URINE      Result Value Ref Range   Preg Test, Ur NEGATIVE  NEGATIVE  URINE MICROSCOPIC-ADD ON      Result Value Ref Range   Squamous Epithelial / LPF RARE  RARE   WBC, UA 0-2  <3 WBC/hpf   RBC / HPF 0-2  <3 RBC/hpf   Bacteria, UA RARE  RARE   Urine-Other MUCOUS PRESENT       Imaging Review No results found.   EKG Interpretation None      MDM   Final diagnoses:  Back pain    She is afebrile, no abnormalities in UA with pain that is radiating into the leg. No N, V, SOB or cough. Will treat as muscular pain and encourage follow up with PCP in 2-3 days for recheck.    Dewaine Oats, PA-C 06/07/13 1917  Dewaine Oats, PA-C 06/18/13 0148

## 2013-06-08 NOTE — ED Provider Notes (Signed)
History/physical exam/procedure(s) were performed by non-physician practitioner and as supervising physician I was immediately available for consultation/collaboration. I have reviewed all notes and am in agreement with care and plan.   [name], MD 06/08/13 [phone]

## 2013-06-18 NOTE — ED Provider Notes (Signed)
History/physical exam/procedure(s) were performed by non-physician practitioner and as supervising physician I was immediately available for consultation/collaboration. I have reviewed all notes and am in agreement with care and plan.   Taylar Hartsough S Chevi Lim, MD 06/18/13 1840 

## 2013-07-18 ENCOUNTER — Encounter (HOSPITAL_COMMUNITY): Payer: Self-pay | Admitting: Emergency Medicine

## 2013-07-18 ENCOUNTER — Inpatient Hospital Stay (HOSPITAL_COMMUNITY): Payer: BC Managed Care – PPO

## 2013-07-18 ENCOUNTER — Emergency Department (HOSPITAL_COMMUNITY): Payer: BC Managed Care – PPO

## 2013-07-18 ENCOUNTER — Inpatient Hospital Stay (HOSPITAL_COMMUNITY)
Admission: EM | Admit: 2013-07-18 | Discharge: 2013-07-27 | DRG: 853 | Disposition: A | Discharge status: Rehabilitation Facility | Payer: BC Managed Care – PPO | Attending: Internal Medicine | Admitting: Internal Medicine

## 2013-07-18 DIAGNOSIS — I517 Cardiomegaly: Secondary | ICD-10-CM

## 2013-07-18 DIAGNOSIS — J449 Chronic obstructive pulmonary disease, unspecified: Secondary | ICD-10-CM | POA: Diagnosis present

## 2013-07-18 DIAGNOSIS — D649 Anemia, unspecified: Secondary | ICD-10-CM | POA: Diagnosis not present

## 2013-07-18 DIAGNOSIS — N179 Acute kidney failure, unspecified: Secondary | ICD-10-CM | POA: Diagnosis present

## 2013-07-18 DIAGNOSIS — R7309 Other abnormal glucose: Secondary | ICD-10-CM | POA: Diagnosis present

## 2013-07-18 DIAGNOSIS — Z6841 Body Mass Index (BMI) 40.0 and over, adult: Secondary | ICD-10-CM

## 2013-07-18 DIAGNOSIS — J9589 Other postprocedural complications and disorders of respiratory system, not elsewhere classified: Secondary | ICD-10-CM

## 2013-07-18 DIAGNOSIS — I10 Essential (primary) hypertension: Secondary | ICD-10-CM

## 2013-07-18 DIAGNOSIS — Z8709 Personal history of other diseases of the respiratory system: Secondary | ICD-10-CM | POA: Diagnosis present

## 2013-07-18 DIAGNOSIS — A419 Sepsis, unspecified organism: Principal | ICD-10-CM | POA: Diagnosis present

## 2013-07-18 DIAGNOSIS — J4489 Other specified chronic obstructive pulmonary disease: Secondary | ICD-10-CM | POA: Diagnosis present

## 2013-07-18 DIAGNOSIS — E872 Acidosis, unspecified: Secondary | ICD-10-CM | POA: Diagnosis present

## 2013-07-18 DIAGNOSIS — Z79899 Other long term (current) drug therapy: Secondary | ICD-10-CM

## 2013-07-18 DIAGNOSIS — R778 Other specified abnormalities of plasma proteins: Secondary | ICD-10-CM

## 2013-07-18 DIAGNOSIS — R7989 Other specified abnormal findings of blood chemistry: Secondary | ICD-10-CM

## 2013-07-18 DIAGNOSIS — I214 Non-ST elevation (NSTEMI) myocardial infarction: Secondary | ICD-10-CM

## 2013-07-18 DIAGNOSIS — I5031 Acute diastolic (congestive) heart failure: Secondary | ICD-10-CM | POA: Diagnosis present

## 2013-07-18 DIAGNOSIS — Z09 Encounter for follow-up examination after completed treatment for conditions other than malignant neoplasm: Secondary | ICD-10-CM

## 2013-07-18 DIAGNOSIS — R6520 Severe sepsis without septic shock: Secondary | ICD-10-CM

## 2013-07-18 DIAGNOSIS — I5030 Unspecified diastolic (congestive) heart failure: Secondary | ICD-10-CM

## 2013-07-18 DIAGNOSIS — G4733 Obstructive sleep apnea (adult) (pediatric): Secondary | ICD-10-CM | POA: Diagnosis present

## 2013-07-18 DIAGNOSIS — J80 Acute respiratory distress syndrome: Secondary | ICD-10-CM

## 2013-07-18 DIAGNOSIS — I509 Heart failure, unspecified: Secondary | ICD-10-CM

## 2013-07-18 DIAGNOSIS — I498 Other specified cardiac arrhythmias: Secondary | ICD-10-CM | POA: Diagnosis present

## 2013-07-18 DIAGNOSIS — E876 Hypokalemia: Secondary | ICD-10-CM | POA: Diagnosis not present

## 2013-07-18 DIAGNOSIS — I5189 Other ill-defined heart diseases: Secondary | ICD-10-CM

## 2013-07-18 DIAGNOSIS — E282 Polycystic ovarian syndrome: Secondary | ICD-10-CM | POA: Diagnosis present

## 2013-07-18 DIAGNOSIS — I4891 Unspecified atrial fibrillation: Secondary | ICD-10-CM | POA: Diagnosis present

## 2013-07-18 DIAGNOSIS — J9600 Acute respiratory failure, unspecified whether with hypoxia or hypercapnia: Secondary | ICD-10-CM

## 2013-07-18 HISTORY — DX: Migraine, unspecified, not intractable, without status migrainosus: G43.909

## 2013-07-18 HISTORY — DX: Essential (primary) hypertension: I10

## 2013-07-18 LAB — CBC WITH DIFFERENTIAL/PLATELET
BASOS PCT: 0 % (ref 0–1)
Basophils Absolute: 0.1 10*3/uL (ref 0.0–0.1)
Eosinophils Absolute: 0 10*3/uL (ref 0.0–0.7)
Eosinophils Relative: 0 % (ref 0–5)
HCT: 44.6 % (ref 36.0–46.0)
Hemoglobin: 14.1 g/dL (ref 12.0–15.0)
Lymphocytes Relative: 12 % (ref 12–46)
Lymphs Abs: 2.3 10*3/uL (ref 0.7–4.0)
MCH: 28.1 pg (ref 26.0–34.0)
MCHC: 31.6 g/dL (ref 30.0–36.0)
MCV: 88.8 fL (ref 78.0–100.0)
Monocytes Absolute: 1.6 10*3/uL — ABNORMAL HIGH (ref 0.1–1.0)
Monocytes Relative: 9 % (ref 3–12)
NEUTROS ABS: 14.7 10*3/uL — AB (ref 1.7–7.7)
NEUTROS PCT: 79 % — AB (ref 43–77)
Platelets: 281 10*3/uL (ref 150–400)
RBC: 5.02 MIL/uL (ref 3.87–5.11)
RDW: 15 % (ref 11.5–15.5)
WBC: 18.7 10*3/uL — ABNORMAL HIGH (ref 4.0–10.5)

## 2013-07-18 LAB — I-STAT ARTERIAL BLOOD GAS, ED
ACID-BASE EXCESS: 2 mmol/L (ref 0.0–2.0)
Bicarbonate: 28.4 mEq/L — ABNORMAL HIGH (ref 20.0–24.0)
O2 SAT: 77 %
TCO2: 30 mmol/L (ref 0–100)
pCO2 arterial: 53.3 mmHg — ABNORMAL HIGH (ref 35.0–45.0)
pH, Arterial: 7.338 — ABNORMAL LOW (ref 7.350–7.450)
pO2, Arterial: 47 mmHg — ABNORMAL LOW (ref 80.0–100.0)

## 2013-07-18 LAB — URINALYSIS, ROUTINE W REFLEX MICROSCOPIC
BILIRUBIN URINE: NEGATIVE
BILIRUBIN URINE: NEGATIVE
GLUCOSE, UA: NEGATIVE mg/dL
Glucose, UA: NEGATIVE mg/dL
HGB URINE DIPSTICK: NEGATIVE
KETONES UR: NEGATIVE mg/dL
Ketones, ur: 15 mg/dL — AB
Leukocytes, UA: NEGATIVE
Leukocytes, UA: NEGATIVE
NITRITE: NEGATIVE
Nitrite: NEGATIVE
PH: 5.5 (ref 5.0–8.0)
PROTEIN: NEGATIVE mg/dL
Protein, ur: NEGATIVE mg/dL
SPECIFIC GRAVITY, URINE: 1.016 (ref 1.005–1.030)
Specific Gravity, Urine: 1.038 — ABNORMAL HIGH (ref 1.005–1.030)
UROBILINOGEN UA: 1 mg/dL (ref 0.0–1.0)
Urobilinogen, UA: 1 mg/dL (ref 0.0–1.0)
pH: 6 (ref 5.0–8.0)

## 2013-07-18 LAB — COMPREHENSIVE METABOLIC PANEL
ALBUMIN: 3.3 g/dL — AB (ref 3.5–5.2)
ALK PHOS: 70 U/L (ref 39–117)
ALT: 418 U/L — ABNORMAL HIGH (ref 0–35)
AST: 348 U/L — ABNORMAL HIGH (ref 0–37)
BUN: 25 mg/dL — ABNORMAL HIGH (ref 6–23)
CHLORIDE: 99 meq/L (ref 96–112)
CO2: 18 mEq/L — ABNORMAL LOW (ref 19–32)
CREATININE: 0.98 mg/dL (ref 0.50–1.10)
Calcium: 9.2 mg/dL (ref 8.4–10.5)
GFR calc Af Amer: 86 mL/min — ABNORMAL LOW (ref >90)
GFR calc non Af Amer: 74 mL/min — ABNORMAL LOW (ref >90)
Glucose, Bld: 142 mg/dL — ABNORMAL HIGH (ref 70–99)
POTASSIUM: 4.7 meq/L (ref 3.7–5.3)
Sodium: 140 mEq/L (ref 137–147)
TOTAL PROTEIN: 7.9 g/dL (ref 6.0–8.3)
Total Bilirubin: 1.1 mg/dL (ref 0.3–1.2)

## 2013-07-18 LAB — I-STAT TROPONIN, ED: TROPONIN I, POC: 0.19 ng/mL — AB (ref 0.00–0.08)

## 2013-07-18 LAB — GLUCOSE, CAPILLARY
Glucose-Capillary: 119 mg/dL — ABNORMAL HIGH (ref 70–99)
Glucose-Capillary: 133 mg/dL — ABNORMAL HIGH (ref 70–99)

## 2013-07-18 LAB — I-STAT CHEM 8, ED
BUN: 25 mg/dL — AB (ref 6–23)
CREATININE: 1.1 mg/dL (ref 0.50–1.10)
Calcium, Ion: 1.11 mmol/L — ABNORMAL LOW (ref 1.12–1.23)
Chloride: 103 mEq/L (ref 96–112)
GLUCOSE: 141 mg/dL — AB (ref 70–99)
HEMATOCRIT: 50 % — AB (ref 36.0–46.0)
Hemoglobin: 17 g/dL — ABNORMAL HIGH (ref 12.0–15.0)
POTASSIUM: 4.4 meq/L (ref 3.7–5.3)
Sodium: 143 mEq/L (ref 137–147)
TCO2: 21 mmol/L (ref 0–100)

## 2013-07-18 LAB — SAMPLE TO BLOOD BANK

## 2013-07-18 LAB — POCT I-STAT 3, ART BLOOD GAS (G3+)
Acid-Base Excess: 5 mmol/L — ABNORMAL HIGH (ref 0.0–2.0)
Bicarbonate: 31 mEq/L — ABNORMAL HIGH (ref 20.0–24.0)
O2 Saturation: 85 %
Patient temperature: 37.8
TCO2: 33 mmol/L (ref 0–100)
pCO2 arterial: 52.5 mmHg — ABNORMAL HIGH (ref 35.0–45.0)
pH, Arterial: 7.383 (ref 7.350–7.450)
pO2, Arterial: 55 mmHg — ABNORMAL LOW (ref 80.0–100.0)

## 2013-07-18 LAB — PRO B NATRIURETIC PEPTIDE: PRO B NATRI PEPTIDE: 3595 pg/mL — AB (ref 0–125)

## 2013-07-18 LAB — MRSA PCR SCREENING: MRSA by PCR: NEGATIVE

## 2013-07-18 LAB — PROTIME-INR
INR: 1.51 — AB (ref 0.00–1.49)
PROTHROMBIN TIME: 17.8 s — AB (ref 11.6–15.2)

## 2013-07-18 LAB — URINE MICROSCOPIC-ADD ON

## 2013-07-18 LAB — I-STAT CG4 LACTIC ACID, ED: Lactic Acid, Venous: 7.29 mmol/L — ABNORMAL HIGH (ref 0.5–2.2)

## 2013-07-18 LAB — TRIGLYCERIDES: Triglycerides: 218 mg/dL — ABNORMAL HIGH (ref <150)

## 2013-07-18 MED ORDER — CHLORHEXIDINE GLUCONATE 0.12 % MT SOLN
15.0000 mL | Freq: Two times a day (BID) | OROMUCOSAL | Status: DC
  Administered 2013-07-18 – 2013-07-22 (×9): 15 mL via OROMUCOSAL
  Filled 2013-07-18 (×9): qty 15

## 2013-07-18 MED ORDER — CISATRACURIUM BOLUS VIA INFUSION
10.0000 mg | Freq: Once | INTRAVENOUS | Status: AC
Start: 2013-07-18 — End: 2013-07-18
  Administered 2013-07-18: 10 mg via INTRAVENOUS
  Filled 2013-07-18: qty 10

## 2013-07-18 MED ORDER — FUROSEMIDE 10 MG/ML IJ SOLN
80.0000 mg | Freq: Once | INTRAMUSCULAR | Status: AC
  Administered 2013-07-18: 80 mg via INTRAVENOUS

## 2013-07-18 MED ORDER — FENTANYL BOLUS VIA INFUSION
50.0000 ug | INTRAVENOUS | Status: DC | PRN
  Administered 2013-07-18: 100 ug via INTRAVENOUS
  Filled 2013-07-18: qty 100

## 2013-07-18 MED ORDER — VANCOMYCIN HCL IN DEXTROSE 1-5 GM/200ML-% IV SOLN
1000.0000 mg | Freq: Once | INTRAVENOUS | Status: DC
  Filled 2013-07-18: qty 200

## 2013-07-18 MED ORDER — PROPOFOL 10 MG/ML IV EMUL
0.0000 ug/kg/min | INTRAVENOUS | Status: DC
  Administered 2013-07-18: 50 ug/kg/min via INTRAVENOUS
  Administered 2013-07-18: 45 ug/kg/min via INTRAVENOUS
  Administered 2013-07-18: 25 ug/kg/min via INTRAVENOUS
  Administered 2013-07-18: 35 ug/kg/min via INTRAVENOUS
  Administered 2013-07-18: 50 ug/kg/min via INTRAVENOUS
  Administered 2013-07-18: 30 ug/kg/min via INTRAVENOUS
  Administered 2013-07-18: 70 ug/kg/min via INTRAVENOUS
  Administered 2013-07-18: 50 ug/kg/min via INTRAVENOUS
  Administered 2013-07-19: 35 ug/kg/min via INTRAVENOUS
  Administered 2013-07-19: 70 ug/kg/min via INTRAVENOUS
  Administered 2013-07-19 (×4): 35 ug/kg/min via INTRAVENOUS
  Administered 2013-07-19: 70 ug/kg/min via INTRAVENOUS
  Administered 2013-07-19: 45 ug/kg/min via INTRAVENOUS
  Administered 2013-07-20 (×3): 35 ug/kg/min via INTRAVENOUS
  Filled 2013-07-18: qty 100
  Filled 2013-07-18: qty 200
  Filled 2013-07-18 (×16): qty 100
  Filled 2013-07-18: qty 200
  Filled 2013-07-18: qty 100

## 2013-07-18 MED ORDER — ALBUTEROL SULFATE (2.5 MG/3ML) 0.083% IN NEBU
2.5000 mg | INHALATION_SOLUTION | RESPIRATORY_TRACT | Status: DC
  Administered 2013-07-18: 2.5 mg via RESPIRATORY_TRACT
  Filled 2013-07-18: qty 3

## 2013-07-18 MED ORDER — PIPERACILLIN-TAZOBACTAM 3.375 G IVPB 30 MIN
3.3750 g | Freq: Once | INTRAVENOUS | Status: DC
  Filled 2013-07-18: qty 50

## 2013-07-18 MED ORDER — ALBUTEROL SULFATE (2.5 MG/3ML) 0.083% IN NEBU
5.0000 mg | INHALATION_SOLUTION | Freq: Once | RESPIRATORY_TRACT | Status: AC
  Administered 2013-07-18: 5 mg via RESPIRATORY_TRACT
  Filled 2013-07-18: qty 6

## 2013-07-18 MED ORDER — FENTANYL CITRATE 0.05 MG/ML IJ SOLN
INTRAMUSCULAR | Status: AC
  Administered 2013-07-18: 50 ug via INTRAVENOUS
  Filled 2013-07-18: qty 2

## 2013-07-18 MED ORDER — ALBUTEROL SULFATE (2.5 MG/3ML) 0.083% IN NEBU
2.5000 mg | INHALATION_SOLUTION | Freq: Four times a day (QID) | RESPIRATORY_TRACT | Status: DC
  Administered 2013-07-18 – 2013-07-24 (×26): 2.5 mg via RESPIRATORY_TRACT
  Filled 2013-07-18 (×26): qty 3

## 2013-07-18 MED ORDER — SODIUM CHLORIDE 0.9 % IV SOLN
2500.0000 mg | Freq: Once | INTRAVENOUS | Status: AC
  Administered 2013-07-18: 2500 mg via INTRAVENOUS
  Filled 2013-07-18: qty 2500

## 2013-07-18 MED ORDER — INSULIN ASPART 100 UNIT/ML ~~LOC~~ SOLN: 2.0000 [IU] | SUBCUTANEOUS | Status: DC

## 2013-07-18 MED ORDER — FENTANYL CITRATE 0.05 MG/ML IJ SOLN
50.0000 ug | Freq: Once | INTRAMUSCULAR | Status: AC
  Administered 2013-07-18: 50 ug via INTRAVENOUS

## 2013-07-18 MED ORDER — SODIUM CHLORIDE 0.9 % IV SOLN
1750.0000 mg | Freq: Two times a day (BID) | INTRAVENOUS | Status: DC
  Administered 2013-07-18 – 2013-07-20 (×4): 1750 mg via INTRAVENOUS
  Filled 2013-07-18 (×5): qty 1750

## 2013-07-18 MED ORDER — HEPARIN SODIUM (PORCINE) 5000 UNIT/ML IJ SOLN
5000.0000 [IU] | Freq: Three times a day (TID) | INTRAMUSCULAR | Status: DC
  Filled 2013-07-18 (×3): qty 1

## 2013-07-18 MED ORDER — PIPERACILLIN-TAZOBACTAM 3.375 G IVPB
3.3750 g | Freq: Three times a day (TID) | INTRAVENOUS | Status: DC
  Filled 2013-07-18: qty 50

## 2013-07-18 MED ORDER — PROPOFOL 10 MG/ML IV EMUL
INTRAVENOUS | Status: AC
  Administered 2013-07-18: 20 ug/kg/min
  Filled 2013-07-18: qty 100

## 2013-07-18 MED ORDER — BIOTENE DRY MOUTH MT LIQD
15.0000 mL | Freq: Four times a day (QID) | OROMUCOSAL | Status: DC
  Administered 2013-07-18 – 2013-07-22 (×17): 15 mL via OROMUCOSAL

## 2013-07-18 MED ORDER — PROPOFOL 10 MG/ML IV EMUL
INTRAVENOUS | Status: AC
  Administered 2013-07-18: 65 ug/kg/min
  Filled 2013-07-18: qty 100

## 2013-07-18 MED ORDER — ARTIFICIAL TEARS OP OINT
1.0000 "application " | TOPICAL_OINTMENT | Freq: Three times a day (TID) | OPHTHALMIC | Status: DC
  Administered 2013-07-18: 1 via OPHTHALMIC
  Filled 2013-07-18: qty 3.5

## 2013-07-18 MED ORDER — ALBUTEROL SULFATE (2.5 MG/3ML) 0.083% IN NEBU
2.5000 mg | INHALATION_SOLUTION | RESPIRATORY_TRACT | Status: DC | PRN
  Administered 2013-07-18: 2.5 mg via RESPIRATORY_TRACT
  Filled 2013-07-18: qty 3

## 2013-07-18 MED ORDER — PIPERACILLIN-TAZOBACTAM 3.375 G IVPB
3.3750 g | Freq: Three times a day (TID) | INTRAVENOUS | Status: DC
  Administered 2013-07-18 – 2013-07-24 (×20): 3.375 g via INTRAVENOUS
  Filled 2013-07-18 (×22): qty 50

## 2013-07-18 MED ORDER — PANTOPRAZOLE SODIUM 40 MG IV SOLR
40.0000 mg | Freq: Every day | INTRAVENOUS | Status: DC
  Administered 2013-07-18 – 2013-07-22 (×5): 40 mg via INTRAVENOUS
  Filled 2013-07-18 (×6): qty 40

## 2013-07-18 MED ORDER — BUDESONIDE 0.25 MG/2ML IN SUSP
0.2500 mg | Freq: Four times a day (QID) | RESPIRATORY_TRACT | Status: DC
  Administered 2013-07-18 – 2013-07-24 (×25): 0.25 mg via RESPIRATORY_TRACT
  Filled 2013-07-18 (×30): qty 2

## 2013-07-18 MED ORDER — HEPARIN SODIUM (PORCINE) 5000 UNIT/ML IJ SOLN
5000.0000 [IU] | Freq: Three times a day (TID) | INTRAMUSCULAR | Status: DC
  Administered 2013-07-18 – 2013-07-27 (×29): 5000 [IU] via SUBCUTANEOUS
  Filled 2013-07-18 (×32): qty 1

## 2013-07-18 MED ORDER — SODIUM CHLORIDE 0.9 % IV SOLN
3.0000 ug/kg/min | INTRAVENOUS | Status: DC
  Administered 2013-07-18: 3 ug/kg/min via INTRAVENOUS
  Filled 2013-07-18: qty 20

## 2013-07-18 MED ORDER — FENTANYL CITRATE 0.05 MG/ML IJ SOLN: 50.0000 ug | Freq: Once | INTRAMUSCULAR | Status: DC

## 2013-07-18 MED ORDER — SODIUM CHLORIDE 0.9 % IV SOLN
0.0000 ug/h | INTRAVENOUS | Status: DC
  Administered 2013-07-18: 250 ug/h via INTRAVENOUS
  Administered 2013-07-18: 75 ug/h via INTRAVENOUS
  Administered 2013-07-19: 200 ug/h via INTRAVENOUS
  Administered 2013-07-19: 400 ug/h via INTRAVENOUS
  Administered 2013-07-20: 200 ug/h via INTRAVENOUS
  Administered 2013-07-21: 150 ug/h via INTRAVENOUS
  Administered 2013-07-21: 200 ug/h via INTRAVENOUS
  Filled 2013-07-18 (×8): qty 50

## 2013-07-18 MED ORDER — NITROGLYCERIN 2 % TD OINT
0.5000 [in_us] | TOPICAL_OINTMENT | Freq: Four times a day (QID) | TRANSDERMAL | Status: DC
  Administered 2013-07-18: 0.5 [in_us] via TOPICAL
  Filled 2013-07-18: qty 1

## 2013-07-18 MED ORDER — SODIUM CHLORIDE 0.9 % IV SOLN: 250.0000 mL | INTRAVENOUS | Status: DC | PRN

## 2013-07-18 NOTE — ED Notes (Addendum)
Unsuccessful in obtaining 2nd PIV x4 attempts (2 US guided attempts), PCCM paged. Propofol infusing to only PIV. Propofol increased d/t pt biting on tube, awake, reaching, pulling away from IV attempts. Pt reassured, oriented, updated. Family updated and in family conference room.

## 2013-07-18 NOTE — Procedures (Signed)
Arterial Catheter Insertion Procedure Note Whitlee Sluder Drumgoole [id number] 1980/08/06  Procedure: Insertion of Arterial Catheter  Indications: Blood pressure monitoring  Procedure Details Consent: Unable to obtain consent because of altered level of consciousness. Time Out: Verified patient identification, verified procedure, site/side was marked, verified correct patient position, special equipment/implants available, medications/allergies/relevent history reviewed, required imaging and test results available.  Performed  Maximum sterile technique was used including antiseptics, cap, gloves, gown, hand hygiene, mask and sheet. Skin prep: Chlorhexidine; local anesthetic administered 20 gauge catheter was inserted into left radial artery using the Seldinger technique.  Evaluation Blood flow good; BP tracing good. Complications: No apparent complications.   Jetson Pickrel M Charolette Bultman 07/18/2013

## 2013-07-18 NOTE — Progress Notes (Signed)
PULMONARY / CRITICAL CARE MEDICINE   Name: Melissa Erickson MRN: [id number] DOB: [birth year]    ADMISSION DATE:  07/18/2013   BRIEF PATIENT DESCRIPTION:  18 F with hx of morbid obesity, htn, OSA, asthma underwent laparoscopic vertical sleeve gastrectomy 5/28 in [place], [place]. Was discharged home the following day. Adm to PCCM service early AM 6/01 after presenting with acute hypoxic failure requiring intubation shortly after arrival.  SIGNIFICANT EVENTS / STUDIES:  6/01 Echocardiogram: LVEF 50-55%. Mild LVH. Grade 1 diastolic dysfxn 7/25 LE venous Dopplers: no evidence of DVT  LINES / TUBES: ETT 6/01 >>  L radial A line 6/01 >>  R IJ CVL 6/01 >>   CULTURES: Urine 6/01 >>  Resp 6/01 >>  Blood 6/01 >>   ANTIBIOTICS: Vanc 6/01 >>  Zosyn 6/01 >>    SUBJECTIVE:  Heavily sedated this AM with NMBs infusion just turned off VITAL SIGNS: Temp:  [98.6 F (37 C)-100.2 F (37.9 C)] 99.5 F (37.5 C) (06/01 1800) Pulse Rate:  [115-142] 118 (06/01 1800) Resp:  [0-35] 0 (06/01 1800) BP: (103-157)/(47-99) 119/54 mmHg (06/01 1800) SpO2:  [50 %-100 %] 100 % (06/01 1800) Arterial Line BP: (80-137)/(62-82) 118/66 mmHg (06/01 1800) FiO2 (%):  [70 %-100 %] 70 % (06/01 1648) Weight:  [182.3 kg (401 lb 14.4 oz)] 182.3 kg (401 lb 14.4 oz) (06/01 0509) HEMODYNAMICS:   VENTILATOR SETTINGS: Vent Mode:  [-] PRVC FiO2 (%):  [70 %-100 %] 70 % Set Rate:  [16 bmp-26 bmp] 26 bmp Vt Set:  [360 mL] 360 mL PEEP:  [10 cmH20-14 cmH20] 14 cmH20 Plateau Pressure:  [27 cmH20-34 cmH20] 28 cmH20 INTAKE / OUTPUT: Intake/Output     05/31 0701 - 06/01 0700 06/01 0701 - 06/02 0700   I.V. (mL/kg) 100 (0.5) 783.4 (4.3)   IV Piggyback  100   Total Intake(mL/kg) 100 (0.5) 883.4 (4.8)   Urine (mL/kg/hr) 2150 560 (0.3)   Total Output 2150 560   Net -2050 +323.4          PHYSICAL EXAMINATION: General:  Morbidly obese, intubated, sedated Neuro: PERRL, rest of exam limited HEENT: NCAT Cardiovascular: distant  HS, regular, no M Lungs: no wheezes, distant BS Abdomen: Soft, 3 small surgical wounds with staples present, no purulence, BS diminished Ext: warm, no edema  LABS:  CBC  Recent Labs Lab 07/18/13 0233 07/18/13 0255  WBC 18.7*  --   HGB 14.1 17.0*  HCT 44.6 50.0*  PLT 281  --    Coag's  Recent Labs Lab 07/18/13 0233  INR 1.51*   BMET  Recent Labs Lab 07/18/13 0233 07/18/13 0255  NA 140 143  K 4.7 4.4  CL 99 103  CO2 18*  --   BUN 25* 25*  CREATININE 0.98 1.10  GLUCOSE 142* 141*   Electrolytes  Recent Labs Lab 07/18/13 0233  CALCIUM 9.2   Sepsis Markers  Recent Labs Lab 07/18/13 0254  LATICACIDVEN 7.29*   ABG  Recent Labs Lab 07/18/13 0602  PHART 7.338*  PCO2ART 53.3*  PO2ART 47.0*   Liver Enzymes  Recent Labs Lab 07/18/13 0233  AST 348*  ALT 418*  ALKPHOS 70  BILITOT 1.1  ALBUMIN 3.3*   Cardiac Enzymes  Recent Labs Lab 07/18/13 0233  PROBNP 3595.0*   Glucose  Recent Labs Lab 07/18/13 0854 07/18/13 1241  GLUCAP 119* 133*    CXR: severe ARDS pattern  ASSESSMENT / PLAN:  PULMONARY A: Acute resp failure ARDS P:   Cont full vent support -  ARDS protocol Cont vent bundle Daily SBT if/when meets criteria   CARDIOVASCULAR A:  Sinus tachycardia - reactive P:  Monitor  RENAL A:   AKI, nonoliguric P:   Monitor BMET intermittently Monitor I/Os Correct electrolytes as indicated  GASTROINTESTINAL A:   Severe obesity S/P bariatric surgery 5/28 P:   SUP: IV PPI Will need to run by one of our bariatric surgeons whether ok to place NGT to initiate TFs  HEMATOLOGIC A:   No issues P:  DVT px: SQ heparin Monitor CBC intermittently Transfuse per usual ICU guidelines   INFECTIOUS A:  Severe sepsis/SIRS P:   Micro and abx as above  ENDOCRINE A:   Mild stress induced hyperglycemia without prior hx of DM P:   Monitor glu on chem panels Begin SSI for glu > 180  NEUROLOGIC A:   ICU associated  discomfort Ventilator dyssynchrony P:   RASS goal: -3 to -4 PAD protocol NMBs DC'd 6/01 - might need to resume for vent dyssynchrony  TODAY'S SUMMARY:  Etiology of her acute critical illness is not well defined. Mother in law updated @ bedside. Need to try to obtain records from [facility]. Need to discuss with bariatric surgeons whether NGT can be safely placed this soon after surgery  I have personally obtained a history, examined the patient, evaluated laboratory and imaging results, formulated the assessment and plan and placed orders. CRITICAL CARE: The patient is critically ill with multiple organ systems failure and requires high complexity decision making for assessment and support, frequent evaluation and titration of therapies, application of advanced monitoring technologies and extensive interpretation of multiple databases. Critical Care Time devoted to patient care services described in this note is 50 minutes.   Merton Border, MD ; [facility] [phone].  After 5:30 PM or weekends, call [phone] Pulmonary and [place] Pager: [phone]  07/18/2013, 6:29 PM

## 2013-07-18 NOTE — Progress Notes (Signed)
VASCULAR LAB PRELIMINARY  PRELIMINARY  PRELIMINARY  PRELIMINARY  Bilateral lower extremity venous Dopplers completed.    Preliminary report:  There is no obvious evidence of DVT or SVT noted in the visualized veins of the bilateral lower extremities.   Iantha Fallen, RVT 07/18/2013, 9:15 AM

## 2013-07-18 NOTE — ED Notes (Addendum)
Pt here from home by [facility], arrives in respiritory distress on CPAP, EMS called out to help lift pt up from ground and back to bed. Sob, rales, wheezing noted at that time. SPO2 on RA <50%. Placed on CPAP. SPO2 up to 91%. albuterol given PTA by EMS, unable to get IV PTA. Recent bariatric surgery reported. Has been sedentary recently. Family present at home and following to ED. swwitched to Bipap on arrival. Dr. Randal Buba present on arrival.

## 2013-07-18 NOTE — Progress Notes (Signed)
ANTIBIOTIC CONSULT NOTE - INITIAL  Pharmacy Consult for Vancomycin/Zosyn  Indication: rule out pneumonia/ARDS  No Known Allergies  Patient Measurements: ~182 kg  Vital Signs: Temp: 100 F (37.8 C) (06/01 0415) BP: 133/94 mmHg (06/01 0447) Pulse Rate: 135 (06/01 0447)  Labs:  Recent Labs  07/18/13 0233 07/18/13 0255  WBC 18.7*  --   HGB 14.1 17.0*  PLT 281  --   CREATININE 0.98 1.10    Medical History: Past Medical History  Diagnosis Date  . Polycystic ovarian syndrome   . Asthma    Assessment: 33 y/o F s/p recent bariatric surgery on 5/28 here with shortness of breath, likely with pulmonary edema, although cannot r/o infection. WBC 18.7, SCr 1.10, lactic acid 7.29, other labs as above.   Goal of Therapy:  Vancomycin trough level 15-20 mcg/ml  Plan:  -Vancomycin 2500 mg IV x 1, followed by 1750 mg IV q12h -Zosyn 3.375G IV q8h to be infused over 4 hours -Trend WBC, temp, renal function  -Drug levels ASAP with obesity  -De-escalate as able based on cultures/clincal status  Alon Mazor 07/18/2013,5:05 AM

## 2013-07-18 NOTE — ED Notes (Addendum)
Dr Sharol Given given a copy of troponin results .[place]

## 2013-07-18 NOTE — ED Notes (Signed)
Dr. Launa Grill Anesthesia at [facility] for intubation.

## 2013-07-18 NOTE — H&P (Signed)
PULMONARY / CRITICAL CARE MEDICINE   Name: Melissa Erickson MRN: [id number] DOB: 06-05-1980    ADMISSION DATE:  07/18/2013  PRIMARY SERVICE: PCCM  CHIEF COMPLAINT:  Acute hypoxemic respiratory failure  BRIEF PATIENT DESCRIPTION:  33 years old morbid obese patient with PMH relevant for HTN, OSA, asthma. Underwent laparoscopic vertical sleeve gastrectomy on 07/14/13. Presents today brought by EMS with severe back pain. Found to be hypoxic to the 50's on RA. Improved partially with BiPAP 100% FiO2 but required intubation. Chest X ray with bilateral diffuse patchy infiltrates compatible with ARDS vs cardiogenic pulmonary edema.   SIGNIFICANT EVENTS / STUDIES:    LINES / TUBES: - Peripheral IV's - Foley catheter - ETT  CULTURES: - Blood cultures sent - Tracheal aspirate ordered  ANTIBIOTICS: - Zosyn - Vancomycin  HISTORY OF PRESENT ILLNESS:   34 years old morbid obese patient with PMH relevant for HTN, OSA, asthma. Underwent laparoscopic vertical sleeve gastrectomy on 07/14/13. The patient is a lifetime non smoker as per the records. Presents today brought by EMS with severe back pain, mostly in bed since discharge. Denied fever, nausea, vomiting. She did have SOB at rest.  Found to be hypoxic to the 50's on RA. Improved partially with BiPAP 100% FiO2. Chest X ray with bilateral diffuse patchy infiltrates compatible with ARDS vs cardiogenic pulmonary edema. No free air on chest X ray. At the time of my examination the patient is lethargic, on BiPAP, mild respiratory distress, tachycardic to the 120's, BP stable. We decided to proceed with controlled intubation due to severe hypoxemia  Requiring 100% FiO2, history of difficult intubation, chest X ray findings and recent gastric surgery. Intubation done by anesthesia.    PAST MEDICAL HISTORY :  Past Medical History  Diagnosis Date  . Polycystic ovarian syndrome   . Asthma    Past Surgical History  Procedure Laterality Date  .  Cholecystectomy    . Cesarean section    . Gastric bypass     Prior to Admission medications   Medication Sig Start Date End Date Taking? Authorizing Provider  ADVAIR DISKUS 100-50 MCG/DOSE AEPB Inhale 2 puffs into the lungs 2 (two) times daily.  07/10/13  Yes Historical Provider, MD  albuterol (PROVENTIL HFA;VENTOLIN HFA) 108 (90 BASE) MCG/ACT inhaler Inhale 2 puffs into the lungs every 6 (six) hours as needed for wheezing.   Yes Historical Provider, MD   No Known Allergies  FAMILY HISTORY:  History reviewed. No pertinent family history. SOCIAL HISTORY:  reports that she has never smoked. She does not have any smokeless tobacco history on file. She reports that she does not drink alcohol or use illicit drugs.  REVIEW OF SYSTEMS:   All systems reviewed and found negative except for what I mentioned in the HPI.   SUBJECTIVE:   VITAL SIGNS: Temp:  [99.7 F (37.6 C)-100.2 F (37.9 C)] 100 F (37.8 C) (06/01 0415) Pulse Rate:  [118-135] 135 (06/01 0447) Resp:  [21-32] 26 (06/01 0447) BP: (120-157)/(47-94) 133/94 mmHg (06/01 0447) SpO2:  [50 %-95 %] 91 % (06/01 0447) FiO2 (%):  [100 %] 100 % (06/01 0447) Weight:  [401 lb 14.4 oz (182.3 kg)] 401 lb 14.4 oz (182.3 kg) (06/01 0509) HEMODYNAMICS:   VENTILATOR SETTINGS: Vent Mode:  [-] PRVC FiO2 (%):  [100 %] 100 % Set Rate:  [16 bmp-26 bmp] 26 bmp Vt Set:  [360 mL] 360 mL PEEP:  [10 cmH20] 10 cmH20 Plateau Pressure:  [34 cmH20] 34 cmH20 INTAKE / OUTPUT: Intake/Output  05/31 0701 - 06/01 0700   Urine (mL/kg/hr) 1950   Total Output 1950   Net -1950         PHYSICAL EXAMINATION: General: Morbid obese female patient lethargic, mild respiratory distress. Eyes: Anicteric sclerae. ENT: Oropharynx clear. Dry mucous membranes. No thrush Lymph: No cervical, supraclavicular, or axillary lymphadenopathy. Heart: Normal S1, S2. No murmurs, rubs, or gallops appreciated. No bruits, equal pulses. Lungs: Bilateral diffuse crackles.  Normal upper airway sounds without evidence of stridor. Abdomen: Abdomen soft, not distended, normoactive bowel sounds. Surgical wounds clean. Musculoskeletal: No clubbing or synovitis. No edema Skin: No rashes or lesions Neuro: No focal neurologic deficits.   LABS:  CBC  Recent Labs Lab 07/18/13 0233 07/18/13 0255  WBC 18.7*  --   HGB 14.1 17.0*  HCT 44.6 50.0*  PLT 281  --    Coag's  Recent Labs Lab 07/18/13 0233  INR 1.51*   BMET  Recent Labs Lab 07/18/13 0233 07/18/13 0255  NA 140 143  K 4.7 4.4  CL 99 103  CO2 18*  --   BUN 25* 25*  CREATININE 0.98 1.10  GLUCOSE 142* 141*   Electrolytes  Recent Labs Lab 07/18/13 0233  CALCIUM 9.2   Sepsis Markers  Recent Labs Lab 07/18/13 0254  LATICACIDVEN 7.29*   ABG No results found for this basename: PHART, PCO2ART, PO2ART,  in the last 168 hours Liver Enzymes  Recent Labs Lab 07/18/13 0233  AST 348*  ALT 418*  ALKPHOS 70  BILITOT 1.1  ALBUMIN 3.3*   Cardiac Enzymes  Recent Labs Lab 07/18/13 0233  PROBNP 3595.0*   Glucose No results found for this basename: GLUCAP,  in the last 168 hours  Imaging Dg Chest Portable 1 View  07/18/2013   CLINICAL DATA:  Respiratory distress.  Shortness of breath.  EXAM: PORTABLE CHEST - 1 VIEW  COMPARISON:  03/06/2013  FINDINGS: Diffuse airspace disease. There may be cardiomegaly, although heart size is accentuated by technique and hypoaeration. No visible effusion. No pneumothorax.  IMPRESSION: Diffuse airspace disease which could reflect edema or multi focal pneumonia.   Electronically Signed   By: Jorje Guild M.D.   On: 07/18/2013 03:02     CXR:  Diffuse airspace disease which could reflect edema or multi focal  pneumonia. ETT in good position  ASSESSMENT / PLAN:  PULMONARY A: 1) Acute hypoxemic respiratory failure. In the differential are ARDS vs cardiogenic pulmonary edema. Etiology of ARDS not clear but may be related to aspiration vs surgical  complication from sleeve gastrectomy, although no free air on chest X ray. Got lasix by ED physician with good urine output.  In favor of cardiogenic pulmonary edema is a BNP of 3595 2) OSA 3) History of asthma? P:   - Mechanical ventilation per ARDS protocol   - PRVC, Vt: 6cc/kg, PEEP: 10, RR: 26, FiO2: 100% and adjust to keep O2 sat 88 to 95%   - VAP prevention order set   - Daily awakening and SBT if indicated - Albuterol q4 hrs and PRN   CARDIOVASCULAR A:  1) Tachycardic, stable BP 2) Lactic acidosis with initial lactate of 7.29 without hypotension, concerning for surgical complication.  3) Elevated BNP  4) Mildly elevated troponin, likely demand ischemia. EKG with inverted T waves inferior leads P:  - ICU monitoring - Surgical consult - Consider cardiology consult. Won't start anticoagulation for now given very recent surgery. - Trend troponin - Echocardiogram - LE dopplers  RENAL A:   1)  Normal creatinine P:   - Will follow BNP  GASTROINTESTINAL A:   1) Post vertical sleeve gastrectomy on 07/14/13, came complaining of severe back pain. Elevated lactic acid without much hypotension. Concerning for possible surgical complication.  P:   - Surgery consult - IV protonix  HEMATOLOGIC A:   1) No issues P:  - Will follow CBC  INFECTIOUS A:   1) ARDS P:   - Empiric treatment with Zosyn and vancomycin - Will follow cultures  ENDOCRINE A:   1) Hyperglycemia P:   - Novolog sliding scale per ICU hyperglycemia protocol.  NEUROLOGIC A:   1) No issues P:   - Sedation with propofol and fentanyl drips - RASS goal of -1  TODAY'S SUMMARY:   I have personally obtained a history, examined the patient, evaluated laboratory and imaging results, formulated the assessment and plan and placed orders. CRITICAL CARE: The patient is critically ill with multiple organ systems failure and requires high complexity decision making for assessment and support, frequent evaluation  and titration of therapies, application of advanced monitoring technologies and extensive interpretation of multiple databases. Critical Care Time devoted to patient care services described in this note is 60 minutes.   Waynetta Pean, MD Pulmonary and [place] Pager: [phone]  07/18/2013, 5:10 AM

## 2013-07-18 NOTE — ED Notes (Signed)
Pt intubated, tolerated well, minimal difficulty, 2 attempts.

## 2013-07-18 NOTE — Progress Notes (Signed)
Echocardiogram 2D Echocardiogram has been performed.  Ines Bloomer 07/18/2013, 9:35 AM

## 2013-07-18 NOTE — Progress Notes (Signed)
INITIAL NUTRITION ASSESSMENT  DOCUMENTATION CODES Per approved criteria  -Morbid Obesity   INTERVENTION: If pt to remain intubated > 48 hrs recommend starting enteral nutrition support.  Recommend utilizing a post pyloric feeding tube as pt had recent sleeve gastrectomy.   Recommend start Vital High Protein @ 10 ml/hr; will recommend changes based on Propofol infusion. Propofol currently providing excess kcal of 2022 per day from lipid.  NUTRITION DIAGNOSIS: Inadequate oral intake related to inability to eat as evidenced by NPO status  Goal: Enteral nutrition to provide 60-70% of estimated calorie needs (22-25 kcals/kg ideal body weight) and 100% of estimated protein needs, based on ASPEN guidelines for permissive underfeeding in critically ill obese individuals  Monitor:  Respiratory status, weight trend, labs, TF initiation/tolerance  Reason for Assessment: Ventilator  33 y.o. female  Admitting Dx: <principal problem not specified>  ASSESSMENT: 33 years old morbid obese patient with PMH relevant for HTN, OSA, asthma. Underwent laparoscopic vertical sleeve gastrectomy on 07/14/13 ([place]). Pt required intubation Chest X ray with bilateral diffuse patchy infiltrates compatible with ARDS vs cardiogenic pulmonary edema.   Patient is currently intubated on ventilator support MV: 10.7 L/min Temp (24hrs), Avg:99.6 F (37.6 C), Min:98.6 F (37 C), Max:100.2 F (37.9 C)  Propofol: 76.6 ml/hr providing: 2022 kcal per day from lipid  Family at bedside and report pt was not taking many fluids or any solids PTA. Pt discussed during ICU rounds and with RN. Per RN will try increased propofol rates prior to Nimbex.    Height: Ht Readings from Last 1 Encounters:  06/07/13 5\' 4"  (1.626 m)    Weight: Wt Readings from Last 1 Encounters:  07/18/13 401 lb 14.4 oz (182.3 kg)    Ideal Body Weight: 54.5 kg   % Ideal Body Weight: 334%  Wt Readings from Last 10 Encounters:  07/18/13  401 lb 14.4 oz (182.3 kg)  06/07/13 402 lb (182.346 kg)  03/06/13 390 lb (176.903 kg)  08/30/12 300 lb (136.079 kg)    Usual Body Weight: 400's  % Usual Body Weight: within range  BMI:  Body mass index is 68.95 kg/(m^2).  Estimated Nutritional Needs: Kcal: 2807 Protein: >/= 136 grams Fluid: 1.5 L/day  Skin: abdominal incision  Diet Order: NPO  EDUCATION NEEDS: -No education needs identified at this time   Intake/Output Summary (Last 24 hours) at 07/18/13 1018 Last data filed at 07/18/13 0800  Gross per 24 hour  Intake  172.2 ml  Output   2250 ml  Net -2077.8 ml    Last BM: PTA   Labs:   Recent Labs Lab 07/18/13 0233 07/18/13 0255  NA 140 143  K 4.7 4.4  CL 99 103  CO2 18*  --   BUN 25* 25*  CREATININE 0.98 1.10  CALCIUM 9.2  --   GLUCOSE 142* 141*    CBG (last 3)   Recent Labs  07/18/13 0854  GLUCAP 119*    Scheduled Meds: . albuterol  2.5 mg Nebulization Q4H  . antiseptic oral rinse  15 mL Mouth Rinse QID  . artificial tears  1 application Both Eyes 3 times per day  . chlorhexidine  15 mL Mouth Rinse BID  . fentaNYL  50 mcg Intravenous Once  . heparin  5,000 Units Subcutaneous 3 times per day  . insulin aspart  2-6 Units Subcutaneous 6 times per day  . pantoprazole (PROTONIX) IV  40 mg Intravenous QHS  . piperacillin-tazobactam (ZOSYN)  IV  3.375 g Intravenous 3 times per  day  . vancomycin  1,750 mg Intravenous Q12H    Continuous Infusions: . cisatracurium (NIMBEX) infusion Stopped (07/18/13 0945)  . fentaNYL infusion INTRAVENOUS 75 mcg/hr (07/18/13 0734)  . propofol 30 mcg/kg/min (07/18/13 1008)    Past Medical History  Diagnosis Date  . Polycystic ovarian syndrome   . Asthma     Past Surgical History  Procedure Laterality Date  . Cholecystectomy    . Cesarean section    . Gastric bypass      [place], [place], [place] Pager [phone] After Hours Pager

## 2013-07-18 NOTE — ED Notes (Signed)
RT x2 at [facility] attempting A-line. PIV attempts aborted. PCCM paged again.

## 2013-07-18 NOTE — Progress Notes (Signed)
Central Venous Catheter Insertion Procedure Note Yona Kosek [place] [id number] [birth year]  Procedure: Insertion of Central Venous Catheter Indications: Assessment of intravascular volume, Drug and/or fluid administration and Frequent blood sampling  Procedure Details Consent: Unable to obtain consent because of emergent medical necessity. Time Out: Verified patient identification, verified procedure, site/side was marked, verified correct patient position, special equipment/implants available, medications/allergies/relevent history reviewed, required imaging and test results available.  Performed  Maximum sterile technique was used including antiseptics, cap, gloves, gown, hand hygiene, mask and sheet. Skin prep: Chlorhexidine; local anesthetic administered A antimicrobial bonded/coated triple lumen catheter was placed in the right internal jugular vein using the Seldinger technique. Procedure done under direct visualization with [place].  Evaluation Blood flow good Complications: No apparent complications Patient did tolerate procedure well. Chest X-ray ordered to verify placement.  CXR: pending.  Carin Hock 07/18/2013, 6:22 AM

## 2013-07-18 NOTE — Progress Notes (Signed)
Called to bedside by ER staff to emergently intubate patient.  Upon arrival, pt was laboring to breathe with assistance of BiPAP.  O2 sat was 91%.  She was sitting.  She verbalized understanding that we needed to intubate in order to improve her respiratory status.  Pt was induced with 200 mg propofol and 140 mg of succinylcholine.  She was intubated with glide scope.  2 attempts needed.  Pt had an anteriorly positioned larynx that required a sharp curve on the ETT stylet.  Airway was secured in <1 minute.  Sat was approximately 50% when ventilation resumed and quickly improved to mid 90%.  Pink frothy sputum was noted in the ETT.  BP remained stable.  ER physician at bedside.  No complications.  Dr Marcie Bal.

## 2013-07-18 NOTE — ED Provider Notes (Signed)
CSN: [id number]     Arrival date & time 07/18/13  0225 History   First MD Initiated Contact with Patient 07/18/13 0234     Chief Complaint  Patient presents with  . Shortness of Breath     (Consider location/radiation/quality/duration/timing/severity/associated sxs/prior Treatment) Patient is a 33 y.o. female presenting with shortness of breath. The history is provided by the EMS personnel. The history is limited by the condition of the patient. No language interpreter was used.  Shortness of Breath Severity:  Severe Onset quality:  Unable to specify Timing:  Constant Progression:  Unchanged Chronicity:  New Relieved by:  Nothing Worsened by:  Nothing tried Ineffective treatments:  None tried Risk factors: recent surgery     Past Medical History  Diagnosis Date  . Polycystic ovarian syndrome   . Asthma    Past Surgical History  Procedure Laterality Date  . Cholecystectomy    . Cesarean section    . Gastric bypass     No family history on file. History  Substance Use Topics  . Smoking status: Never Smoker   . Smokeless tobacco: Not on file  . Alcohol Use: No   OB History   Grav Para Term Preterm Abortions TAB SAB Ect Mult Living                 Review of Systems  Unable to perform ROS Respiratory: Positive for shortness of breath.       Allergies  Review of patient's allergies indicates no known allergies.  Home Medications   Prior to Admission medications   Medication Sig Start Date End Date Taking? Authorizing Provider  albuterol (PROVENTIL HFA;VENTOLIN HFA) 108 (90 BASE) MCG/ACT inhaler Inhale 2 puffs into the lungs every 6 (six) hours as needed for wheezing.    Historical Provider, MD  ibuprofen (ADVIL,MOTRIN) 800 MG tablet Take 1 tablet (800 mg total) by mouth 3 (three) times daily. 08/30/12   Domenic Moras, PA-C  nystatin (MYCOSTATIN) powder Apply between skin folds of breasts qid 10/17/12   Janne Napoleon, NP  oxyCODONE-acetaminophen (PERCOCET/ROXICET) 5-325  MG per tablet Take 1-2 tablets by mouth every 6 (six) hours as needed. 06/07/13   Shari A Upstill, PA-C  predniSONE (DELTASONE) 10 MG tablet Take 2 tablets (20 mg total) by mouth daily. 03/06/13   [name], MD  sulfamethoxazole-trimethoprim (SEPTRA DS) 800-160 MG per tablet Take 1 tablet by mouth 2 (two) times daily. X 7 days 10/17/12   Janne Napoleon, NP   BP 146/70  Pulse 123  Temp(Src) 99.7 F (37.6 C)  Resp 30  SpO2 95% Physical Exam  Constitutional: She appears well-developed and well-nourished.  HENT:  Head: Normocephalic and atraumatic.  Mouth/Throat: Oropharynx is clear and moist.  Eyes: Conjunctivae and EOM are normal. Pupils are equal, round, and reactive to light.  Neck: Normal range of motion. Neck supple.  Cardiovascular: Regular rhythm.  Tachycardia present.   Pulmonary/Chest: She is in respiratory distress. She has wheezes. She has rales.  Abdominal: Soft. Bowel sounds are normal. There is no tenderness. There is no rebound and no guarding.  Trocar sites intact clean and dry LU trocar site has ecchymosis  Musculoskeletal: Normal range of motion. She exhibits no edema.  Neurological: She is alert. She has normal reflexes.  Skin: Skin is warm and dry. She is not diaphoretic.  Psychiatric: She has a normal mood and affect.    ED Course  Procedures (including critical care time) Labs Review Labs Reviewed  PRO B NATRIURETIC PEPTIDE -  Abnormal; Notable for the following:    Pro B Natriuretic peptide (BNP) 3595.0 (*)    All other components within normal limits  CBC WITH DIFFERENTIAL - Abnormal; Notable for the following:    WBC 18.7 (*)    Neutrophils Relative % 79 (*)    Neutro Abs 14.7 (*)    Monocytes Absolute 1.6 (*)    All other components within normal limits  PROTIME-INR - Abnormal; Notable for the following:    Prothrombin Time 17.8 (*)    INR 1.51 (*)    All other components within normal limits  I-STAT TROPOININ, ED - Abnormal; Notable for the following:     Troponin i, poc 0.19 (*)    All other components within normal limits  I-STAT CG4 LACTIC ACID, ED - Abnormal; Notable for the following:    Lactic Acid, Venous 7.29 (*)    All other components within normal limits  I-STAT CHEM 8, ED - Abnormal; Notable for the following:    BUN 25 (*)    Glucose, Bld 141 (*)    Calcium, Ion 1.11 (*)    Hemoglobin 17.0 (*)    HCT 50.0 (*)    All other components within normal limits  CULTURE, BLOOD (ROUTINE X 2)  CULTURE, BLOOD (ROUTINE X 2)  COMPREHENSIVE METABOLIC PANEL  URINALYSIS, ROUTINE W REFLEX MICROSCOPIC  I-STAT TROPOININ, ED  SAMPLE TO BLOOD BANK    Imaging Review Dg Chest Portable 1 View  07/18/2013   CLINICAL DATA:  Respiratory distress.  Shortness of breath.  EXAM: PORTABLE CHEST - 1 VIEW  COMPARISON:  03/06/2013  FINDINGS: Diffuse airspace disease. There may be cardiomegaly, although heart size is accentuated by technique and hypoaeration. No visible effusion. No pneumothorax.  IMPRESSION: Diffuse airspace disease which could reflect edema or multi focal pneumonia.   Electronically Signed   By: Jorje Guild M.D.   On: 07/18/2013 03:02     EKG Interpretation   Date/Time:  Monday July 18 2013 02:32:35 EDT Ventricular Rate:  123 PR Interval:  137 QRS Duration: 74 QT Interval:  279 QTC Calculation: 399 R Axis:   40 Text Interpretation:  Sinus tachycardia Low voltage, precordial leads  Confirmed by [facility]  MD, Derrin Currey ([id number]) on 07/18/2013 3:11:04 AM      MDM   Final diagnoses:  None    MDM Reviewed: previous chart, nursing note and vitals ([facility] ) Interpretation: labs, ECG and x-ray (elevated BNP and troponin and WBC and lactate XR chf by me) Total time providing critical care: 75-105 minutes. This excludes time spent performing separately reportable procedures and services. Consults: pulmonary  CRITICAL CARE Performed by: Maleeyah Mccaughey K Shateka Petrea-Rasch Total critical care time: 90 minutes Critical care time was  exclusive of separately billable procedures and treating other patients. Critical care was necessary to treat or prevent imminent or life-threatening deterioration. Critical care was time spent personally by me on the following activities: development of treatment plan with patient and/or surrogate as well as nursing, discussions with consultants, evaluation of patient's response to treatment, examination of patient, obtaining history from patient or surrogate, ordering and performing treatments and interventions, ordering and review of laboratory studies, ordering and review of radiographic studies, pulse oximetry and re-evaluation of patient's condition.  Medications  vancomycin (VANCOCIN) IVPB 1000 mg/200 mL premix (not administered)  piperacillin-tazobactam (ZOSYN) IVPB 3.375 g (not administered)  nitroGLYCERIN (NITROGLYN) 2 % ointment 0.5 inch (0.5 inches Topical Given 07/18/13 0344)  furosemide (LASIX) injection 80 mg (80 mg Intravenous Given 07/18/13  4081)  albuterol (PROVENTIL) (2.5 MG/3ML) 0.083% nebulizer solution 5 mg (5 mg Nebulization Given 07/18/13 0238)       Senica Crall K Hyrum Shaneyfelt-Rasch, MD 07/18/13 0405

## 2013-07-18 NOTE — ED Notes (Addendum)
Pending arrival of anesthesiologist/ CRNA. Pt remains calmer, NAD, breathing with Bipap, pending intubation. Sister at [facility]. NTG paste removed from L chest per PCCM.

## 2013-07-18 NOTE — ED Notes (Signed)
Dr Lars Masson given a copy of troponin results .[place]

## 2013-07-18 NOTE — ED Notes (Addendum)
Diuretic explained, declined foley. attempted urinal, unable. Agreed to foley.

## 2013-07-18 NOTE — ED Notes (Addendum)
Cooperative with foley, tolerated well, tolerating Bipap, NAD, calm.

## 2013-07-18 NOTE — Progress Notes (Addendum)
Pt was placed on BiPAP 12/6 with 100% FIO2. Pt is wearing a Large FFM with 3 LPM [place] placed under BIPAP per MD. Pt also received 5 mg Albuterol treatment. Pt is tolerating well at this time.

## 2013-07-18 NOTE — ED Notes (Signed)
Dr. Cranford Mon PCCM at [facility], preparing for central line, pt remains awake, coachable, calmer, A-line placed by RT.

## 2013-07-18 NOTE — ED Notes (Signed)
Dr. Wynonia Hazard PCCM in to room. Intubation discussed with pt/family. Fentanyl given for complaint of foley pain. Pt becoming anxious which she relates to foley pain.

## 2013-07-18 NOTE — ED Notes (Addendum)
Dr Lars Masson given a copy of lactic acid results 7.29

## 2013-07-19 ENCOUNTER — Encounter (HOSPITAL_COMMUNITY): Payer: BC Managed Care – PPO | Admitting: Certified Registered Nurse Anesthetist

## 2013-07-19 ENCOUNTER — Encounter (HOSPITAL_COMMUNITY)
Admission: EM | Disposition: A | Discharge status: Rehabilitation Facility | Payer: Self-pay | Source: Home / Self Care | Attending: Pulmonary Disease

## 2013-07-19 ENCOUNTER — Inpatient Hospital Stay (HOSPITAL_COMMUNITY): Payer: BC Managed Care – PPO | Admitting: Certified Registered Nurse Anesthetist

## 2013-07-19 ENCOUNTER — Encounter (HOSPITAL_COMMUNITY): Payer: Self-pay | Admitting: General Surgery

## 2013-07-19 ENCOUNTER — Inpatient Hospital Stay (HOSPITAL_COMMUNITY): Payer: BC Managed Care – PPO

## 2013-07-19 ENCOUNTER — Other Ambulatory Visit (HOSPITAL_COMMUNITY): Payer: BC Managed Care – PPO

## 2013-07-19 DIAGNOSIS — I10 Essential (primary) hypertension: Secondary | ICD-10-CM

## 2013-07-19 DIAGNOSIS — I509 Heart failure, unspecified: Secondary | ICD-10-CM

## 2013-07-19 DIAGNOSIS — Z9884 Bariatric surgery status: Secondary | ICD-10-CM

## 2013-07-19 DIAGNOSIS — J95821 Acute postprocedural respiratory failure: Secondary | ICD-10-CM

## 2013-07-19 DIAGNOSIS — M549 Dorsalgia, unspecified: Secondary | ICD-10-CM

## 2013-07-19 DIAGNOSIS — J9589 Other postprocedural complications and disorders of respiratory system, not elsewhere classified: Secondary | ICD-10-CM

## 2013-07-19 DIAGNOSIS — R799 Abnormal finding of blood chemistry, unspecified: Secondary | ICD-10-CM

## 2013-07-19 HISTORY — PX: LAPAROSCOPY: SHX197

## 2013-07-19 HISTORY — PX: ESOPHAGOGASTRODUODENOSCOPY: SHX5428

## 2013-07-19 LAB — PROTIME-INR
INR: 1.45 (ref 0.00–1.49)
Prothrombin Time: 17.3 seconds — ABNORMAL HIGH (ref 11.6–15.2)

## 2013-07-19 LAB — BLOOD GAS, ARTERIAL
Acid-Base Excess: 4.9 mmol/L — ABNORMAL HIGH (ref 0.0–2.0)
Acid-base deficit: 12.6 mmol/L — ABNORMAL HIGH (ref 0.0–2.0)
Bicarbonate: 11.2 mEq/L — ABNORMAL LOW (ref 20.0–24.0)
Bicarbonate: 30.3 mEq/L — ABNORMAL HIGH (ref 20.0–24.0)
DRAWN BY: 225631
Drawn by: 225631
FIO2: 0.5 %
FIO2: 0.4 %
LHR: 18 {breaths}/min
LHR: 20 {breaths}/min
MECHVT: 493 mL
O2 Saturation: 100 %
O2 Saturation: 86.8 %
PEEP/CPAP: 12 cmH2O
PEEP/CPAP: 10 cmH2O
PH ART: 7.413 (ref 7.350–7.450)
PH ART: 7.343 — AB (ref 7.350–7.450)
Patient temperature: 98.6
Patient temperature: 98.6
Pressure control: 18 cmH2O
Pressure control: 14 cmH2O
TCO2: 11.8 mmol/L (ref 0–100)
TCO2: 32 mmol/L (ref 0–100)
pCO2 arterial: 18 mmHg — CL (ref 35.0–45.0)
pCO2 arterial: 57.3 mmHg (ref 35.0–45.0)
pO2, Arterial: 129 mmHg — ABNORMAL HIGH (ref 80.0–100.0)
pO2, Arterial: 56.5 mmHg — ABNORMAL LOW (ref 80.0–100.0)

## 2013-07-19 LAB — COMPREHENSIVE METABOLIC PANEL
ALBUMIN: 2.8 g/dL — AB (ref 3.5–5.2)
ALK PHOS: 55 U/L (ref 39–117)
ALT: 483 U/L — AB (ref 0–35)
AST: 165 U/L — AB (ref 0–37)
BILIRUBIN TOTAL: 0.6 mg/dL (ref 0.3–1.2)
BUN: 15 mg/dL (ref 6–23)
CHLORIDE: 103 meq/L (ref 96–112)
CO2: 32 mEq/L (ref 19–32)
Calcium: 8.1 mg/dL — ABNORMAL LOW (ref 8.4–10.5)
Creatinine, Ser: 0.81 mg/dL (ref 0.50–1.10)
GFR calc Af Amer: >90 mL/min (ref >90)
GFR calc non Af Amer: >90 mL/min (ref >90)
Glucose, Bld: 137 mg/dL — ABNORMAL HIGH (ref 70–99)
POTASSIUM: 3.8 meq/L (ref 3.7–5.3)
Sodium: 144 mEq/L (ref 137–147)
TOTAL PROTEIN: 6.5 g/dL (ref 6.0–8.3)

## 2013-07-19 LAB — CBC
HEMATOCRIT: 38.6 % (ref 36.0–46.0)
Hemoglobin: 11.7 g/dL — ABNORMAL LOW (ref 12.0–15.0)
MCH: 27.5 pg (ref 26.0–34.0)
MCHC: 30.3 g/dL (ref 30.0–36.0)
MCV: 90.8 fL (ref 78.0–100.0)
PLATELETS: 189 10*3/uL (ref 150–400)
RBC: 4.25 MIL/uL (ref 3.87–5.11)
RDW: 14.8 % (ref 11.5–15.5)
WBC: 10.6 10*3/uL — AB (ref 4.0–10.5)

## 2013-07-19 LAB — URINE CULTURE
COLONY COUNT: NO GROWTH
Culture: NO GROWTH

## 2013-07-19 LAB — TRIGLYCERIDES: TRIGLYCERIDES: 274 mg/dL — AB (ref <150)

## 2013-07-19 LAB — TROPONIN I: Troponin I: 0.98 ng/mL (ref <0.30)

## 2013-07-19 LAB — GLUCOSE, CAPILLARY
Glucose-Capillary: 108 mg/dL — ABNORMAL HIGH (ref 70–99)
Glucose-Capillary: 114 mg/dL — ABNORMAL HIGH (ref 70–99)

## 2013-07-19 SURGERY — LAPAROSCOPY, DIAGNOSTIC
Anesthesia: General | Site: Esophagus

## 2013-07-19 MED ORDER — TRACE MINERALS CR-CU-F-FE-I-MN-MO-SE-ZN IV SOLN
INTRAVENOUS | Status: DC
  Filled 2013-07-19: qty 1000

## 2013-07-19 MED ORDER — BUPIVACAINE-EPINEPHRINE (PF) 0.25% -1:200000 IJ SOLN
INTRAMUSCULAR | Status: AC
  Filled 2013-07-19: qty 30

## 2013-07-19 MED ORDER — ACETAMINOPHEN 650 MG RE SUPP
650.0000 mg | Freq: Four times a day (QID) | RECTAL | Status: DC | PRN
  Administered 2013-07-19: 650 mg via RECTAL
  Filled 2013-07-19: qty 1

## 2013-07-19 MED ORDER — CISATRACURIUM BOLUS VIA INFUSION
10.0000 mg | Freq: Once | INTRAVENOUS | Status: AC
  Administered 2013-07-19: 10 mg via INTRAVENOUS
  Filled 2013-07-19: qty 10

## 2013-07-19 MED ORDER — IOHEXOL 350 MG/ML SOLN
200.0000 mL | Freq: Once | INTRAVENOUS | Status: AC | PRN
  Administered 2013-07-19: 200 mL via INTRAVENOUS

## 2013-07-19 MED ORDER — PROPOFOL 10 MG/ML IV EMUL
INTRAVENOUS | Status: AC
  Filled 2013-07-19: qty 100

## 2013-07-19 MED ORDER — LACTATED RINGERS IV SOLN
INTRAVENOUS | Status: DC | PRN
  Administered 2013-07-19: 16:00:00 via INTRAVENOUS

## 2013-07-19 MED ORDER — POTASSIUM CHLORIDE 20 MEQ/15ML (10%) PO LIQD
40.0000 meq | Freq: Three times a day (TID) | ORAL | Status: AC
Start: 2013-07-19 — End: 2013-07-19
  Filled 2013-07-19 (×2): qty 30

## 2013-07-19 MED ORDER — INSULIN ASPART 100 UNIT/ML ~~LOC~~ SOLN: 2.0000 [IU] | SUBCUTANEOUS | Status: DC

## 2013-07-19 MED ORDER — FENTANYL CITRATE 0.05 MG/ML IJ SOLN
INTRAMUSCULAR | Status: AC
  Filled 2013-07-19: qty 5

## 2013-07-19 MED ORDER — BUPIVACAINE-EPINEPHRINE 0.25% -1:200000 IJ SOLN
INTRAMUSCULAR | Status: DC | PRN
  Administered 2013-07-19: 25 mL

## 2013-07-19 MED ORDER — ROCURONIUM BROMIDE 100 MG/10ML IV SOLN
INTRAVENOUS | Status: DC | PRN
  Administered 2013-07-19 (×2): 50 mg via INTRAVENOUS

## 2013-07-19 MED ORDER — FENTANYL CITRATE 0.05 MG/ML IJ SOLN
INTRAMUSCULAR | Status: DC | PRN
  Administered 2013-07-19 (×2): 100 ug via INTRAVENOUS
  Administered 2013-07-19: 50 ug via INTRAVENOUS

## 2013-07-19 MED ORDER — SODIUM CHLORIDE 0.9 % IR SOLN
Status: DC | PRN
  Administered 2013-07-19: 1000 mL

## 2013-07-19 MED ORDER — ROCURONIUM BROMIDE 50 MG/5ML IV SOLN
INTRAVENOUS | Status: AC
  Filled 2013-07-19: qty 1

## 2013-07-19 SURGICAL SUPPLY — 68 items
APL SKNCLS STERI-STRIP NONHPOA (GAUZE/BANDAGES/DRESSINGS) ×3
APPLIER CLIP 5 13 M/L LIGAMAX5 (MISCELLANEOUS)
APR CLP MED LRG 5 ANG JAW (MISCELLANEOUS)
BANDAGE ADHESIVE 1X3 (GAUZE/BANDAGES/DRESSINGS) ×10 IMPLANT
BENZOIN TINCTURE PRP APPL 2/3 (GAUZE/BANDAGES/DRESSINGS) ×5 IMPLANT
BLADE SURG ROTATE 9660 (MISCELLANEOUS) IMPLANT
CANISTER SUCTION 2500CC (MISCELLANEOUS) ×5 IMPLANT
CHLORAPREP W/TINT 26ML (MISCELLANEOUS) ×5 IMPLANT
CLIP APPLIE 5 13 M/L LIGAMAX5 (MISCELLANEOUS) IMPLANT
COVER MAYO STAND STRL (DRAPES) IMPLANT
COVER SURGICAL LIGHT HANDLE (MISCELLANEOUS) ×5 IMPLANT
DECANTER SPIKE VIAL GLASS SM (MISCELLANEOUS) ×5 IMPLANT
DRAPE C-ARM 42X72 X-RAY (DRAPES) IMPLANT
DRAPE LAPAROSCOPIC ABDOMINAL (DRAPES) ×5 IMPLANT
DRAPE PROXIMA HALF (DRAPES) IMPLANT
DRAPE UTILITY 15X26 W/TAPE STR (DRAPE) ×10 IMPLANT
DRAPE WARM FLUID 44X44 (DRAPE) ×5 IMPLANT
DRSG OPSITE POSTOP 4X10 (GAUZE/BANDAGES/DRESSINGS) IMPLANT
DRSG OPSITE POSTOP 4X8 (GAUZE/BANDAGES/DRESSINGS) IMPLANT
ELECT BLADE 6.5 EXT (BLADE) IMPLANT
ELECT CAUTERY BLADE 6.4 (BLADE) ×2 IMPLANT
ELECT REM PT RETURN 9FT ADLT (ELECTROSURGICAL) ×5
ELECTRODE REM PT RTRN 9FT ADLT (ELECTROSURGICAL) ×3 IMPLANT
GLOVE BIOGEL PI IND STRL 7.0 (GLOVE) ×2 IMPLANT
GLOVE BIOGEL PI INDICATOR 7.0 (GLOVE) ×4
GLOVE SURG SIGNA 7.5 PF LTX (GLOVE) ×11 IMPLANT
GLOVE SURG SS PI 7.0 STRL IVOR (GLOVE) ×3 IMPLANT
GOWN STRL REUS W/ TWL LRG LVL3 (GOWN DISPOSABLE) ×6 IMPLANT
GOWN STRL REUS W/ TWL XL LVL3 (GOWN DISPOSABLE) ×6 IMPLANT
GOWN STRL REUS W/TWL LRG LVL3 (GOWN DISPOSABLE) ×10
GOWN STRL REUS W/TWL XL LVL3 (GOWN DISPOSABLE) ×20
KIT BASIN OR (CUSTOM PROCEDURE TRAY) ×5 IMPLANT
KIT ROOM TURNOVER OR (KITS) ×5 IMPLANT
LIGASURE IMPACT 36 18CM CVD LR (INSTRUMENTS) IMPLANT
NDL SPNL 18GX3.5 QUINCKE PK (NEEDLE) IMPLANT
NEEDLE SPNL 18GX3.5 QUINCKE PK (NEEDLE) ×5 IMPLANT
NS IRRIG 1000ML POUR BTL (IV SOLUTION) ×10 IMPLANT
PACK GENERAL/GYN (CUSTOM PROCEDURE TRAY) ×2 IMPLANT
PAD ARMBOARD 7.5X6 YLW CONV (MISCELLANEOUS) ×5 IMPLANT
PENCIL BUTTON HOLSTER BLD 10FT (ELECTRODE) IMPLANT
SCISSORS LAP 5X35 DISP (ENDOMECHANICALS) IMPLANT
SET IRRIG TUBING LAPAROSCOPIC (IRRIGATION / IRRIGATOR) IMPLANT
SLEEVE ENDOPATH XCEL 5M (ENDOMECHANICALS) ×5 IMPLANT
SPECIMEN JAR LARGE (MISCELLANEOUS) IMPLANT
SPONGE GAUZE 4X4 12PLY STER LF (GAUZE/BANDAGES/DRESSINGS) ×3 IMPLANT
SPONGE LAP 18X18 X RAY DECT (DISPOSABLE) IMPLANT
STAPLER VISISTAT 35W (STAPLE) ×5 IMPLANT
SUCTION POOLE TIP (SUCTIONS) ×2 IMPLANT
SUT MON AB 4-0 PC3 18 (SUTURE) ×5 IMPLANT
SUT PDS AB 1 TP1 96 (SUTURE) ×10 IMPLANT
SUT SILK 2 0 SH CR/8 (SUTURE) ×5 IMPLANT
SUT SILK 2 0 TIES 10X30 (SUTURE) ×5 IMPLANT
SUT SILK 3 0 SH CR/8 (SUTURE) ×5 IMPLANT
SUT SILK 3 0 TIES 10X30 (SUTURE) ×5 IMPLANT
SUT VIC AB 3-0 SH 18 (SUTURE) IMPLANT
TAPE CLOTH SURG 4X10 WHT LF (GAUZE/BANDAGES/DRESSINGS) ×3 IMPLANT
TOWEL OR 17X24 6PK STRL BLUE (TOWEL DISPOSABLE) ×5 IMPLANT
TOWEL OR 17X26 10 PK STRL BLUE (TOWEL DISPOSABLE) ×5 IMPLANT
TRAY FOLEY CATH 16FRSI W/METER (SET/KITS/TRAYS/PACK) IMPLANT
TRAY LAPAROSCOPIC (CUSTOM PROCEDURE TRAY) ×5 IMPLANT
TROCAR BLADELESS 12MM (ENDOMECHANICALS) ×3 IMPLANT
TROCAR BLADELESS 5MM (ENDOMECHANICALS) ×3 IMPLANT
TROCAR XCEL BLUNT TIP 100MML (ENDOMECHANICALS) ×3 IMPLANT
TROCAR XCEL NON-BLD 11X100MML (ENDOMECHANICALS) IMPLANT
TROCAR XCEL NON-BLD 5MMX100MML (ENDOMECHANICALS) ×5 IMPLANT
TUBE CONNECTING 12'X1/4 (SUCTIONS)
TUBE CONNECTING 12X1/4 (SUCTIONS) IMPLANT
YANKAUER SUCT BULB TIP NO VENT (SUCTIONS) IMPLANT

## 2013-07-19 NOTE — Consult Note (Signed)
Name: Melissa Erickson is a 33 y.o. female Admit date: 07/18/2013 Referring Physician:  Waynetta Pean, MD Primary Physician:  ? Primary Cardiologist:  None  Reason for Consultation:  Elevated troponin  ASSESSMENT:  1. Elevated troponin related to demand supply mismatch in setting of respiratory failure is the most likely etiology. Doubt ACS related to coronary disease.  2. Acute diastolic heart failure. BNP > 3500 and echo with diastolic abnormality with preserved EF, pulmonary edema on x-ray (ARDS, Aspiration, Pulmonary embolism, seems unlikely but not excluded.)  3. Obesity, Morbid  4. Hypertension  PLAN: 1. Exclude PE 2. Would not and cannot do further cardiac evaluation 3. No indication for heparin based on ACS. 4. Support as you are doing, including diuresis as tolerated.   HPI:  [place] Town bariatric surgery. Now with acute presentation for respiratory failure. No reported chest pain or other problems with heart prior to acute illness. Major history is of morbid obesity.  PMH:   Past Medical History  Diagnosis Date  . Polycystic ovarian syndrome   . Asthma     PSH:   Past Surgical History  Procedure Laterality Date  . Cholecystectomy    . Cesarean section    . Gastric bypass     Allergies:  Review of patient's allergies indicates no known allergies. Prior to Admit Meds:   Prescriptions prior to admission  Medication Sig Dispense Refill  . ADVAIR DISKUS 100-50 MCG/DOSE AEPB Inhale 2 puffs into the lungs 2 (two) times daily.       Marland Kitchen albuterol (PROVENTIL HFA;VENTOLIN HFA) 108 (90 BASE) MCG/ACT inhaler Inhale 2 puffs into the lungs every 6 (six) hours as needed for wheezing.       Fam HX:   History reviewed. No pertinent family history. Social HX:    History   Social History  . Marital Status: Single    Spouse Name: N/A    Number of Children: N/A  . Years of Education: N/A   Occupational History  . Not on file.   Social History Main Topics  . Smoking  status: Never Smoker   . Smokeless tobacco: Not on file  . Alcohol Use: No  . Drug Use: No  . Sexual Activity: Not on file   Other Topics Concern  . Not on file   Social History Narrative  . No narrative on file     Review of Systems: Unable to obtain  Physical Exam: Blood pressure 118/53, pulse 110, temperature 100.2 F (37.9 C), temperature source Core (Comment), resp. rate 23, height 5' 4.17" (1.63 m), weight 401 lb 14.4 oz (182.3 kg), SpO2 98.00%. Weight change:   Obese Tachycardia Clear lungs Sedated Labs: Lab Results  Component Value Date   WBC 10.6* 07/19/2013   HGB 11.7* 07/19/2013   HCT 38.6 07/19/2013   MCV 90.8 07/19/2013   PLT 189 07/19/2013    Recent Labs Lab 07/19/13 0350  NA 144  K 3.8  CL 103  CO2 32  BUN 15  CREATININE 0.81  CALCIUM 8.1*  PROT 6.5  BILITOT 0.6  ALKPHOS 55  ALT 483*  AST 165*  GLUCOSE 137*   No results found for this basename: PTT   Lab Results  Component Value Date   INR 1.51* 07/18/2013   Lab Results  Component Value Date   TROPONINI 0.98* 07/19/2013     Lab Results  Component Value Date   TRIG 274* 07/19/2013   TRIG 218* 07/18/2013   No results found for  this basename: CHOLHDL   No results found for this basename: LDLDIRECT      Radiology:  Dg Chest Port 1 View  07/19/2013   CLINICAL DATA:  Respiratory failure.  EXAM: PORTABLE CHEST - 1 VIEW  COMPARISON:  Chest x-ray 07/18/2013.  FINDINGS: Endotracheal tube is been withdrawn. Is tip is approximately 2.1 cm above the carina. Right IJ line in stable position. Stable cardiomegaly. Stable diffuse bilateral pulmonary infiltrates. Differential diagnosis includes pulmonary edema and/or pneumonia. No pleural effusion or pneumothorax.  IMPRESSION: 1. Interval withdrawal of endotracheal tube from right mainstem bronchus. The tip is now 2.1 cm above the carina. Right IJ line in stable position. 2. Persistent severe bilateral pulmonary alveolar infiltrates, unchanged. Differential diagnosis  includes pulmonary edema and/or pneumonia. 3. Persistent cardiomegaly.   Electronically Signed   By: Marcello Moores  Register   On: 07/19/2013 07:34   Dg Chest Portable 1 View  07/18/2013   CLINICAL DATA:  Right-sided central line placement  EXAM: PORTABLE CHEST - 1 VIEW  COMPARISON:  Chest x-ray from the same day at 4:51 a.m.  FINDINGS: Interval advancement of the endotracheal tube, now on the right mainstem bronchus. Recommend retraction by 3-4 cm. New right IJ central line, tip in the upper right atrium. No visible pneumothorax. No evidence of new effusion. No change in mediastinal size or contour.  Unchanged hypoaeration and diffuse airspace disease.  These results were called by telephone at the time of interpretation on 07/18/2013 at 6:36 AM to [place], who verbally acknowledged these results.  IMPRESSION: 1. Advancement of endotracheal tube, now in the right mainstem bronchus. Recommend retraction by 3 to 4 cm. 2. Right IJ catheter, tip in the right atrium.  No pneumothorax. 3. Unchanged diffuse airspace disease.   Electronically Signed   By: Jorje Guild M.D.   On: 07/18/2013 06:40   Dg Chest Portable 1 View  07/18/2013   CLINICAL DATA:  Check endotracheal tube position  EXAM: PORTABLE CHEST - 1 VIEW  COMPARISON:  Chest x-ray from the same day at 2:35 a.m.  FINDINGS: Endotracheal tube tip at the mid thoracic trachea level, near the clavicular heads.  Unchanged heart size, accentuated by low lung volumes and portable technique. Diffuse airspace disease is without appreciable change. No evidence of increasing pleural fluid. No pneumothorax.  IMPRESSION: 1. New endotracheal tube in good position. 2. Unchanged low lung volumes and extensive airspace disease.   Electronically Signed   By: Jorje Guild M.D.   On: 07/18/2013 05:10   Dg Chest Portable 1 View  07/18/2013   CLINICAL DATA:  Respiratory distress.  Shortness of breath.  EXAM: PORTABLE CHEST - 1 VIEW  COMPARISON:  03/06/2013  FINDINGS: Diffuse airspace  disease. There may be cardiomegaly, although heart size is accentuated by technique and hypoaeration. No visible effusion. No pneumothorax.  IMPRESSION: Diffuse airspace disease which could reflect edema or multi focal pneumonia.   Electronically Signed   By: Jorje Guild M.D.   On: 07/18/2013 03:02    EKG:  Initial ECG with diffuse ST abnormality that has gradually improved.    Belva Crome III 07/19/2013 10:38 AM

## 2013-07-19 NOTE — Progress Notes (Signed)
PULMONARY / CRITICAL CARE MEDICINE   Name: Melissa Erickson MRN: [id number] DOB: 09-22-1980    ADMISSION DATE:  07/18/2013   BRIEF PATIENT DESCRIPTION:  70 F with hx of morbid obesity, htn, OSA, asthma underwent laparoscopic vertical sleeve gastrectomy 5/28 in [place], [place]. Was discharged home the following day. Adm to PCCM service early AM 6/01 after presenting with acute hypoxic failure requiring intubation shortly after arrival.  SIGNIFICANT EVENTS / STUDIES:  6/01 Echocardiogram: LVEF 50-55%. Mild LVH. Grade 1 diastolic dysfxn 2/63 LE venous Dopplers: no evidence of DVT  LINES / TUBES: ETT 6/01 >>  L radial A line 6/01 >>  R IJ CVL 6/01 >>   CULTURES: Urine 6/01 >>  Resp 6/01 >>  Blood 6/01 >>   ANTIBIOTICS: Vanc 6/01 >>  Zosyn 6/01 >>   SUBJECTIVE:  Heavily sedated this AM had to be paralyzed overnight due to asynchrony  VITAL SIGNS: Temp:  [98.6 F (37 C)-100.6 F (38.1 C)] 100.2 F (37.9 C) (06/02 0700) Pulse Rate:  [109-120] 114 (06/02 0700) Resp:  [0-36] 17 (06/02 0700) BP: (100-136)/(53-77) 117/69 mmHg (06/02 0700) SpO2:  [91 %-100 %] 100 % (06/02 0748) Arterial Line BP: (80-137)/(62-90) 93/74 mmHg (06/02 0700) FiO2 (%):  [50 %-80 %] 50 % (06/02 0748)  HEMODYNAMICS:   VENTILATOR SETTINGS: Vent Mode:  [-] PRVC FiO2 (%):  [50 %-80 %] 50 % Set Rate:  [26 bmp] 26 bmp Vt Set:  [360 mL] 360 mL PEEP:  [12 cmH20-14 cmH20] 12 cmH20 Plateau Pressure:  [26 cmH20-30 cmH20] 26 cmH20 INTAKE / OUTPUT: Intake/Output     06/01 0701 - 06/02 0700 06/02 0701 - 06/03 0700   I.V. (mL/kg) 1913.1 (10.5)    IV Piggyback 1200    Total Intake(mL/kg) 3113.1 (17.1)    Urine (mL/kg/hr) 1202 (0.3)    Total Output 1202     Net +1911.1           PHYSICAL EXAMINATION: General:  Morbidly obese, intubated, sedated Neuro: PERRL, rest of exam limited HEENT: NCAT Cardiovascular: distant HS, regular, no M Lungs: no wheezes, distant BS Abdomen: Soft, 3 small surgical wounds  with staples present, no purulence, BS diminished Ext: warm, no edema  LABS:  CBC  Recent Labs Lab 07/18/13 0233 07/18/13 0255 07/19/13 0350  WBC 18.7*  --  10.6*  HGB 14.1 17.0* 11.7*  HCT 44.6 50.0* 38.6  PLT 281  --  189   Coag's  Recent Labs Lab 07/18/13 0233  INR 1.51*   BMET  Recent Labs Lab 07/18/13 0233 07/18/13 0255 07/19/13 0350  NA 140 143 144  K 4.7 4.4 3.8  CL 99 103 103  CO2 18*  --  32  BUN 25* 25* 15  CREATININE 0.98 1.10 0.81  GLUCOSE 142* 141* 137*   Electrolytes  Recent Labs Lab 07/18/13 0233 07/19/13 0350  CALCIUM 9.2 8.1*   Sepsis Markers  Recent Labs Lab 07/18/13 0254  LATICACIDVEN 7.29*   ABG  Recent Labs Lab 07/18/13 0602 07/18/13 2126  PHART 7.338* 7.383  PCO2ART 53.3* 52.5*  PO2ART 47.0* 55.0*   Liver Enzymes  Recent Labs Lab 07/18/13 0233 07/19/13 0350  AST 348* 165*  ALT 418* 483*  ALKPHOS 70 55  BILITOT 1.1 0.6  ALBUMIN 3.3* 2.8*   Cardiac Enzymes  Recent Labs Lab 07/18/13 0233  PROBNP 3595.0*   Glucose  Recent Labs Lab 07/18/13 0854 07/18/13 1241  GLUCAP 119* 133*    CXR: severe ARDS pattern  ASSESSMENT / PLAN:  PULMONARY A: Acute resp failure ARDS Very asynchronous with vent P:   - Cont full vent support - changed to PCV due to asynchrony, much improved with PCV. - Cont vent bundle - Daily SBT if/when meets criteria - F/U ABG and CXR in AM, ABG now.  CARDIOVASCULAR A:  Sinus tachycardia - reactive Troponin mildly positive. P:  - Recheck troponin now. - If remains elevated will call cardiology on consultation as I suspect the 0.19 on admission was demand ischemia related.  RENAL A:   AKI, nonoliguric, improved. P:   - Monitor BMET intermittently. - Monitor I/Os. - Correct electrolytes as indicated.  GASTROINTESTINAL A:   Severe obesity S/P bariatric surgery 5/28 P:   - SUP: IV PPI. - Will need to run by one of our bariatric surgeons whether ok to place NGT to  initiate TFs  HEMATOLOGIC A:   No issues P:  - DVT px: SQ heparin. - Monitor CBC intermittently. - Transfuse per usual ICU guidelines.  INFECTIOUS A:  Severe sepsis/SIRS P:   - Micro and abx as above.  ENDOCRINE A:   Mild stress induced hyperglycemia without prior hx of DM P:   - Monitor glu on chem panels. - Begin SSI for glu > 180.  NEUROLOGIC A:   ICU associated discomfort Ventilator dyssynchrony P:   - RASS goal: -3 to -4 - PAD protocol - NMBs DC'd 6/01 - might need to resume for vent dyssynchrony  TODAY'S SUMMARY:  Etiology of her acute critical illness is not well defined.  I suspect PNA.  Will check with surgeons today to see when and if we can place OGT for TF.  Will recheck troponins, if positive then will call cards.  Change to PCV today with high PEEP.  I have personally obtained a history, examined the patient, evaluated laboratory and imaging results, formulated the assessment and plan and placed orders.  CRITICAL CARE: The patient is critically ill with multiple organ systems failure and requires high complexity decision making for assessment and support, frequent evaluation and titration of therapies, application of advanced monitoring technologies and extensive interpretation of multiple databases. Critical Care Time devoted to patient care services described in this note is 35 minutes.   Rush Farmer, M.D. [facility] Pulmonary/Critical Care Medicine. Pager: [phone]. After hours pager: [phone].

## 2013-07-19 NOTE — Consult Note (Signed)
Melissa Erickson [birth year]  [id number].   Primary Bariatric surgeon: Dr. Griffith Citron Requesting MD: Dr. Jennet Maduro Chief Complaint/Reason for Consult: shock, s/p recent lap vertical sleeve gastrectomy HPI: This is a 33 yo black female who has morbid obesity who just underwent a laparoscopic vertical sleeve gastrectomy on 07/14/13 at [facility] in [place] by Dr. Griffith Citron (NOT a gastric bypass!).  Her Aunt brought her to [facility] on 07-18-13 due to severe back pain and found to be hypoxic with diffuse patchy infiltrates c/w ARDS vs pulmonary edema on PCXR.  She has since been intubated.  Her troponins are also elevated and cards is evaluating her.  They feels this is likely secondary to demand.  Her CXR did not show any free air.  Her lactic acid was 7 and she had some elevation of her LFTs c/w possible mild shock liver and some mild elevation of her PT/INR at 1.51.  We have been asked to see for further evaluation and recommendations.  ROS: Please see HPI, otherwise unable to obtain as she is sedated and intubated on vent  History reviewed. No pertinent family history.  Past Medical History  Diagnosis Date  . Polycystic ovarian syndrome   . Asthma     Past Surgical History  Procedure Laterality Date  . Cholecystectomy    . Cesarean section    . Laparoscopic gastric sleeve resection      Social History:  reports that she has never smoked. She does not have any smokeless tobacco history on file. She reports that she does not drink alcohol or use illicit drugs.  Allergies: No Known Allergies  Medications Prior to Admission  Medication Sig Dispense Refill  . ADVAIR DISKUS 100-50 MCG/DOSE AEPB Inhale 2 puffs into the lungs 2 (two) times daily.       Marland Kitchen albuterol (PROVENTIL HFA;VENTOLIN HFA) 108 (90 BASE) MCG/ACT inhaler Inhale 2 puffs into the lungs every 6 (six) hours as needed for wheezing.        Blood pressure 118/53, pulse 110, temperature 100.2 F (37.9 C),  temperature source Core (Comment), resp. rate 23, height 5' 4.17" (1.63 m), weight 401 lb 14.4 oz (182.3 kg), SpO2 98.00%. Physical Exam: General: morbidly obese black female who is laying in bed on vent  HEENT: head is normocephalic, atraumatic.  Sclera are not evaluated as eyes are closed.  Ears and nose without any masses or lesions.  Mouth has ETT in place Heart: regular, rate, and rhythm, mild tachy.  Normal s1,s2. No obvious murmurs, gallops, or rubs noted.  Palpable radial and pedal pulses bilaterally Lungs: CTAB, no wheezes, rhonchi, or rales noted, but decrease at bases  Respiratory effort supported by vent Abd: soft, NT, ND, +BS, no masses, hernias, or organomegaly, unreliable exam due to amount of sedation though.  Multiple small lap incisions from recent surgery MS: all 4 extremities are symmetrical with no cyanosis, clubbing, or edema. Skin: warm and dry with no masses, lesions, or rashes Psych: unable to assess due to sedation on vent.    Results for orders placed during the hospital encounter of 07/18/13 (from the past 48 hour(s))  PRO B NATRIURETIC PEPTIDE     Status: Abnormal   Collection Time    07/18/13  2:33 AM      Result Value Ref Range   Pro B Natriuretic peptide (BNP) 3595.0 (*) 0 - 125 pg/mL  CBC WITH DIFFERENTIAL     Status: Abnormal   Collection Time    07/18/13  2:33  AM      Result Value Ref Range   WBC 18.7 (*) 4.0 - 10.5 K/uL   RBC 5.02  3.87 - 5.11 MIL/uL   Hemoglobin 14.1  12.0 - 15.0 g/dL   HCT 44.6  36.0 - 46.0 %   MCV 88.8  78.0 - 100.0 fL   MCH 28.1  26.0 - 34.0 pg   MCHC 31.6  30.0 - 36.0 g/dL   RDW 15.0  11.5 - 15.5 %   Platelets 281  150 - 400 K/uL   Neutrophils Relative % 79 (*) 43 - 77 %   Neutro Abs 14.7 (*) 1.7 - 7.7 K/uL   Lymphocytes Relative 12  12 - 46 %   Lymphs Abs 2.3  0.7 - 4.0 K/uL   Monocytes Relative 9  3 - 12 %   Monocytes Absolute 1.6 (*) 0.1 - 1.0 K/uL   Eosinophils Relative 0  0 - 5 %   Eosinophils Absolute 0.0  0.0 -  0.7 K/uL   Basophils Relative 0  0 - 1 %   Basophils Absolute 0.1  0.0 - 0.1 K/uL  COMPREHENSIVE METABOLIC PANEL     Status: Abnormal   Collection Time    07/18/13  2:33 AM      Result Value Ref Range   Sodium 140  137 - 147 mEq/L   Potassium 4.7  3.7 - 5.3 mEq/L   Chloride 99  96 - 112 mEq/L   CO2 18 (*) 19 - 32 mEq/L   Glucose, Bld 142 (*) 70 - 99 mg/dL   BUN 25 (*) 6 - 23 mg/dL   Creatinine, Ser 0.98  0.50 - 1.10 mg/dL   Calcium 9.2  8.4 - 10.5 mg/dL   Total Protein 7.9  6.0 - 8.3 g/dL   Albumin 3.3 (*) 3.5 - 5.2 g/dL   AST 348 (*) 0 - 37 U/L   ALT 418 (*) 0 - 35 U/L   Alkaline Phosphatase 70  39 - 117 U/L   Total Bilirubin 1.1  0.3 - 1.2 mg/dL   GFR calc non Af Amer 74 (*) >90 mL/min   GFR calc Af Amer 86 (*) >90 mL/min   Comment: (NOTE)     The eGFR has been calculated using the CKD EPI equation.     This calculation has not been validated in all clinical situations.     eGFR's persistently <90 mL/min signify possible Chronic Kidney     Disease.  PROTIME-INR     Status: Abnormal   Collection Time    07/18/13  2:33 AM      Result Value Ref Range   Prothrombin Time 17.8 (*) 11.6 - 15.2 seconds   INR 1.51 (*) 0.00 - 1.49  SAMPLE TO BLOOD BANK     Status: None   Collection Time    07/18/13  2:33 AM      Result Value Ref Range   Blood Bank Specimen SAMPLE AVAILABLE FOR TESTING     Sample Expiration 07/19/2013    I-STAT TROPOININ, ED     Status: Abnormal   Collection Time    07/18/13  2:52 AM      Result Value Ref Range   Troponin i, poc 0.19 (*) 0.00 - 0.08 ng/mL   Comment NOTIFIED PHYSICIAN     Comment 3            Comment: Due to the release kinetics of cTnI,     a negative result within the first  hours     of the onset of symptoms does not rule out     myocardial infarction with certainty.     If myocardial infarction is still suspected,     repeat the test at appropriate intervals.  I-STAT CG4 LACTIC ACID, ED     Status: Abnormal   Collection Time     07/18/13  2:54 AM      Result Value Ref Range   Lactic Acid, Venous 7.29 (*) 0.5 - 2.2 mmol/L  I-STAT CHEM 8, ED     Status: Abnormal   Collection Time    07/18/13  2:55 AM      Result Value Ref Range   Sodium 143  137 - 147 mEq/L   Potassium 4.4  3.7 - 5.3 mEq/L   Chloride 103  96 - 112 mEq/L   BUN 25 (*) 6 - 23 mg/dL   Creatinine, Ser 1.10  0.50 - 1.10 mg/dL   Glucose, Bld 141 (*) 70 - 99 mg/dL   Calcium, Ion 1.11 (*) 1.12 - 1.23 mmol/L   TCO2 21  0 - 100 mmol/L   Hemoglobin 17.0 (*) 12.0 - 15.0 g/dL   HCT 50.0 (*) 36.0 - 46.0 %  URINALYSIS, ROUTINE W REFLEX MICROSCOPIC     Status: Abnormal   Collection Time    07/18/13  3:25 AM      Result Value Ref Range   Color, Urine GREEN (*) YELLOW   Comment: BIOCHEMICALS MAY BE AFFECTED BY COLOR   APPearance CLEAR  CLEAR   Specific Gravity, Urine 1.016  1.005 - 1.030   pH 5.5  5.0 - 8.0   Glucose, UA NEGATIVE  NEGATIVE mg/dL   Hgb urine dipstick TRACE (*) NEGATIVE   Bilirubin Urine NEGATIVE  NEGATIVE   Ketones, ur 15 (*) NEGATIVE mg/dL   Protein, ur NEGATIVE  NEGATIVE mg/dL   Urobilinogen, UA 1.0  0.0 - 1.0 mg/dL   Nitrite NEGATIVE  NEGATIVE   Leukocytes, UA NEGATIVE  NEGATIVE  URINE MICROSCOPIC-ADD ON     Status: None   Collection Time    07/18/13  3:25 AM      Result Value Ref Range   Squamous Epithelial / LPF RARE  RARE   RBC / HPF 0-2  <3 RBC/hpf   Bacteria, UA RARE  RARE  CULTURE, BLOOD (ROUTINE X 2)     Status: None   Collection Time    07/18/13  3:30 AM      Result Value Ref Range   Specimen Description BLOOD LEFT ANTECUBITAL     Special Requests BOTTLES DRAWN AEROBIC AND ANAEROBIC 10CC EA     Culture  Setup Time       Value: 07/18/2013 09:10     Performed at [facility]   Culture       Value:        BLOOD CULTURE RECEIVED NO GROWTH TO DATE CULTURE WILL BE HELD FOR 5 DAYS BEFORE ISSUING A FINAL NEGATIVE REPORT     Performed at [facility]   Report Status PENDING    I-STAT ARTERIAL BLOOD GAS, ED      Status: Abnormal   Collection Time    07/18/13  6:02 AM      Result Value Ref Range   pH, Arterial 7.338 (*) 7.350 - 7.450   pCO2 arterial 53.3 (*) 35.0 - 45.0 mmHg   pO2, Arterial 47.0 (*) 80.0 - 100.0 mmHg   Bicarbonate 28.4 (*) 20.0 - 24.0  mEq/L   TCO2 30  0 - 100 mmol/L   O2 Saturation 77.0     Acid-Base Excess 2.0  0.0 - 2.0 mmol/L   Patient temperature 37.8 C     Collection site ARTERIAL LINE     Drawn by RT     Sample type ARTERIAL    MRSA PCR SCREENING     Status: None   Collection Time    07/18/13  7:58 AM      Result Value Ref Range   MRSA by PCR NEGATIVE  NEGATIVE   Comment:            The GeneXpert MRSA Assay (FDA     approved for NASAL specimens     only), is one component of a     comprehensive MRSA colonization     surveillance program. It is not     intended to diagnose MRSA     infection nor to guide or     monitor treatment for     MRSA infections.  GLUCOSE, CAPILLARY     Status: Abnormal   Collection Time    07/18/13  8:54 AM      Result Value Ref Range   Glucose-Capillary 119 (*) 70 - 99 mg/dL  CULTURE, BLOOD (ROUTINE X 2)     Status: None   Collection Time    07/18/13  9:05 AM      Result Value Ref Range   Specimen Description BLOOD LEFT HAND     Special Requests BOTTLES DRAWN AEROBIC ONLY 4CC     Culture  Setup Time       Value: 07/18/2013 12:34     Performed at [facility]   Culture       Value:        BLOOD CULTURE RECEIVED NO GROWTH TO DATE CULTURE WILL BE HELD FOR 5 DAYS BEFORE ISSUING A FINAL NEGATIVE REPORT     Performed at [facility]   Report Status PENDING    TRIGLYCERIDES     Status: Abnormal   Collection Time    07/18/13  9:15 AM      Result Value Ref Range   Triglycerides 218 (*) <150 mg/dL  URINALYSIS, ROUTINE W REFLEX MICROSCOPIC     Status: Abnormal   Collection Time    07/18/13 10:29 AM      Result Value Ref Range   Color, Urine YELLOW  YELLOW   APPearance TURBID (*) CLEAR   Specific Gravity, Urine  1.038 (*) 1.005 - 1.030   pH 6.0  5.0 - 8.0   Glucose, UA NEGATIVE  NEGATIVE mg/dL   Hgb urine dipstick NEGATIVE  NEGATIVE   Bilirubin Urine NEGATIVE  NEGATIVE   Ketones, ur NEGATIVE  NEGATIVE mg/dL   Protein, ur NEGATIVE  NEGATIVE mg/dL   Urobilinogen, UA 1.0  0.0 - 1.0 mg/dL   Nitrite NEGATIVE  NEGATIVE   Leukocytes, UA NEGATIVE  NEGATIVE  URINE MICROSCOPIC-ADD ON     Status: Abnormal   Collection Time    07/18/13 10:29 AM      Result Value Ref Range   Squamous Epithelial / LPF RARE  RARE   WBC, UA 0-2  <3 WBC/hpf   RBC / HPF 0-2  <3 RBC/hpf   Bacteria, UA FEW (*) RARE   Urine-Other AMORPHOUS URATES/PHOSPHATES    GLUCOSE, CAPILLARY     Status: Abnormal   Collection Time    07/18/13 12:41 PM      Result Value  Ref Range   Glucose-Capillary 133 (*) 70 - 99 mg/dL  POCT I-STAT 3, ART BLOOD GAS (G3+)     Status: Abnormal   Collection Time    07/18/13  9:26 PM      Result Value Ref Range   pH, Arterial 7.383  7.350 - 7.450   pCO2 arterial 52.5 (*) 35.0 - 45.0 mmHg   pO2, Arterial 55.0 (*) 80.0 - 100.0 mmHg   Bicarbonate 31.0 (*) 20.0 - 24.0 mEq/L   TCO2 33  0 - 100 mmol/L   O2 Saturation 85.0     Acid-Base Excess 5.0 (*) 0.0 - 2.0 mmol/L   Patient temperature 37.8 C     Collection site RADIAL, ALLEN'S TEST ACCEPTABLE     Drawn by VENIPUNCTURE     Sample type ARTERIAL    CBC     Status: Abnormal   Collection Time    07/19/13  3:50 AM      Result Value Ref Range   WBC 10.6 (*) 4.0 - 10.5 K/uL   RBC 4.25  3.87 - 5.11 MIL/uL   Hemoglobin 11.7 (*) 12.0 - 15.0 g/dL   Comment: REPEATED TO VERIFY     RESULT CALLED TO, READ BACK BY AND VERIFIED WITH:     Orlena Sheldon RN [id number] [phone] GREEN R   HCT 38.6  36.0 - 46.0 %   MCV 90.8  78.0 - 100.0 fL   MCH 27.5  26.0 - 34.0 pg   MCHC 30.3  30.0 - 36.0 g/dL   RDW 14.8  11.5 - 15.5 %   Platelets 189  150 - 400 K/uL   Comment: REPEATED TO VERIFY  COMPREHENSIVE METABOLIC PANEL     Status: Abnormal   Collection Time    07/19/13  3:50 AM       Result Value Ref Range   Sodium 144  137 - 147 mEq/L   Potassium 3.8  3.7 - 5.3 mEq/L   Chloride 103  96 - 112 mEq/L   CO2 32  19 - 32 mEq/L   Glucose, Bld 137 (*) 70 - 99 mg/dL   BUN 15  6 - 23 mg/dL   Creatinine, Ser 0.81  0.50 - 1.10 mg/dL   Calcium 8.1 (*) 8.4 - 10.5 mg/dL   Total Protein 6.5  6.0 - 8.3 g/dL   Albumin 2.8 (*) 3.5 - 5.2 g/dL   AST 165 (*) 0 - 37 U/L   ALT 483 (*) 0 - 35 U/L   Alkaline Phosphatase 55  39 - 117 U/L   Total Bilirubin 0.6  0.3 - 1.2 mg/dL   GFR calc non Af Amer >90  >90 mL/min   GFR calc Af Amer >90  >90 mL/min   Comment: (NOTE)     The eGFR has been calculated using the CKD EPI equation.     This calculation has not been validated in all clinical situations.     eGFR's persistently <90 mL/min signify possible Chronic Kidney     Disease.  TRIGLYCERIDES     Status: Abnormal   Collection Time    07/19/13  3:50 AM      Result Value Ref Range   Triglycerides 274 (*) <150 mg/dL  BLOOD GAS, ARTERIAL     Status: Abnormal   Collection Time    07/19/13  8:45 AM      Result Value Ref Range   FIO2 0.50     Delivery systems VENTILATOR     Mode PRESSURE  CONTROL     Rate 18     Peep/cpap 12.0     Pressure control 18     pH, Arterial 7.413  7.350 - 7.450   pCO2 arterial 18.0 (*) 35.0 - 45.0 mmHg   Comment: CRITICAL RESULT CALLED TO, READ BACK BY AND VERIFIED WITH:     PATRICK SWEENY,RRT AT 0857,BY LAUREN DOYLE RRT,RCP ON 07/19/13   pO2, Arterial 129.0 (*) 80.0 - 100.0 mmHg   Bicarbonate 11.2 (*) 20.0 - 24.0 mEq/L   TCO2 11.8  0 - 100 mmol/L   Acid-base deficit 12.6 (*) 0.0 - 2.0 mmol/L   O2 Saturation 100.0     Patient temperature 98.6     Collection site A-LINE     Drawn by [id number]     Sample type ARTERIAL DRAW     Allens test (pass/fail) PASS  PASS  TROPONIN I     Status: Abnormal   Collection Time    07/19/13  8:55 AM      Result Value Ref Range   Troponin I 0.98 (*) <0.30 ng/mL   Comment:            Due to the release kinetics of cTnI,      a negative result within the first hours     of the onset of symptoms does not rule out     myocardial infarction with certainty.     If myocardial infarction is still suspected,     repeat the test at appropriate intervals.     CRITICAL RESULT CALLED TO, READ BACK BY AND VERIFIED WITH:     [facility] 07/19/13 0937 BY BSLADE   Dg Chest Port 1 View  07/19/2013   CLINICAL DATA:  Respiratory failure.  EXAM: PORTABLE CHEST - 1 VIEW  COMPARISON:  Chest x-ray 07/18/2013.  FINDINGS: Endotracheal tube is been withdrawn. Is tip is approximately 2.1 cm above the carina. Right IJ line in stable position. Stable cardiomegaly. Stable diffuse bilateral pulmonary infiltrates. Differential diagnosis includes pulmonary edema and/or pneumonia. No pleural effusion or pneumothorax.  IMPRESSION: 1. Interval withdrawal of endotracheal tube from right mainstem bronchus. The tip is now 2.1 cm above the carina. Right IJ line in stable position. 2. Persistent severe bilateral pulmonary alveolar infiltrates, unchanged. Differential diagnosis includes pulmonary edema and/or pneumonia. 3. Persistent cardiomegaly.   Electronically Signed   By: Marcello Moores  Register   On: 07/19/2013 07:34   Dg Chest Portable 1 View  07/18/2013   CLINICAL DATA:  Right-sided central line placement  EXAM: PORTABLE CHEST - 1 VIEW  COMPARISON:  Chest x-ray from the same day at 4:51 a.m.  FINDINGS: Interval advancement of the endotracheal tube, now on the right mainstem bronchus. Recommend retraction by 3-4 cm. New right IJ central line, tip in the upper right atrium. No visible pneumothorax. No evidence of new effusion. No change in mediastinal size or contour.  Unchanged hypoaeration and diffuse airspace disease.  These results were called by telephone at the time of interpretation on 07/18/2013 at 6:36 AM to [place], who verbally acknowledged these results.  IMPRESSION: 1. Advancement of endotracheal tube, now in the right mainstem bronchus. Recommend  retraction by 3 to 4 cm. 2. Right IJ catheter, tip in the right atrium.  No pneumothorax. 3. Unchanged diffuse airspace disease.   Electronically Signed   By: Jorje Guild M.D.   On: 07/18/2013 06:40   Dg Chest Portable 1 View  07/18/2013   CLINICAL DATA:  Check endotracheal tube position  EXAM: PORTABLE CHEST - 1 VIEW  COMPARISON:  Chest x-ray from the same day at 2:35 a.m.  FINDINGS: Endotracheal tube tip at the mid thoracic trachea level, near the clavicular heads.  Unchanged heart size, accentuated by low lung volumes and portable technique. Diffuse airspace disease is without appreciable change. No evidence of increasing pleural fluid. No pneumothorax.  IMPRESSION: 1. New endotracheal tube in good position. 2. Unchanged low lung volumes and extensive airspace disease.   Electronically Signed   By: Jorje Guild M.D.   On: 07/18/2013 05:10   Dg Chest Portable 1 View  07/18/2013   CLINICAL DATA:  Respiratory distress.  Shortness of breath.  EXAM: PORTABLE CHEST - 1 VIEW  COMPARISON:  03/06/2013  FINDINGS: Diffuse airspace disease. There may be cardiomegaly, although heart size is accentuated by technique and hypoaeration. No visible effusion. No pneumothorax.  IMPRESSION: Diffuse airspace disease which could reflect edema or multi focal pneumonia.   Electronically Signed   By: Jorje Guild M.D.   On: 07/18/2013 03:02       Assessment/Plan 1. POD 5, s/p lap vertical sleeve gastrectomy in [place] 2. VDRF 3. Mildly elevated LFTs 4. Mildly elevated INR 5. Mild troponin elevation, likely demand per cards 6. Morbid obesity 7. OSA 8. PCOS  Plan: 1. Patient initially presented with severe back pain 4 days s/p lap vertical sleeve gastrectomy.  She needs a CT scan of her abdomen to rule out a leak as this is a 1-9% complication risk.  She also needs a CTA of her chest to rule out a PE.  These have both been ordered.   2. Recheck INR to assess its value as if she needs return to the OR we need  to make sure this hasn't gone any higher 3. No OG/NG tube for now given recent stomach surgery 4. We will follow along and make further recommendations after her CT has returned. 5. I have discussed this case with my attending as well as Dr. Greer Pickerel, one of our bariatric surgeons  Henreitta Cea 07/19/2013, 10:46 AM Pager: [phone]

## 2013-07-19 NOTE — Anesthesia Preprocedure Evaluation (Addendum)
Anesthesia Evaluation  Patient identified by MRN, date of birth, ID band Patient awake    Reviewed: Allergy & Precautions, H&P , NPO status , Patient's Chart, lab work & pertinent test results  Airway      Comment: Already intubated. Dental  (+) Dental Advisory Given   Pulmonary asthma ,          Cardiovascular hypertension, +CHF     Neuro/Psych    GI/Hepatic   Endo/Other  Morbid obesity  Renal/GU      Musculoskeletal   Abdominal   Peds  Hematology   Anesthesia Other Findings   Reproductive/Obstetrics                          Anesthesia Physical Anesthesia Plan  ASA: IV and emergent  Anesthesia Plan: General   Post-op Pain Management:    Induction: Intravenous  Airway Management Planned: Oral ETT  Additional Equipment: Arterial line and CVP  Intra-op Plan:   Post-operative Plan: Post-operative intubation/ventilation  Informed Consent: I have reviewed the patients History and Physical, chart, labs and discussed the procedure including the risks, benefits and alternatives for the proposed anesthesia with the patient or authorized representative who has indicated his/her understanding and acceptance.   Dental advisory given  Plan Discussed with: CRNA, Surgeon and Anesthesiologist  Anesthesia Plan Comments:         Anesthesia Quick Evaluation

## 2013-07-19 NOTE — Progress Notes (Addendum)
NUTRITION FOLLOW-UP  DOCUMENTATION CODES Per approved criteria  -Morbid Obesity   INTERVENTION: TPN per Pharmacy, limited ability to meet protein needs due to limitations of pre-mixed solutions currently available. (will need to adjust based on propofol rates)  Recommend starting enteral nutrition support if no leak found.   NUTRITION DIAGNOSIS: Inadequate oral intake related to inability to eat as evidenced by NPO status; ongoing.   Goal: Enteral nutrition to provide 60-70% of estimated calorie needs (22-25 kcals/kg ideal body weight) and 100% of estimated protein needs, based on ASPEN guidelines for permissive underfeeding in critically ill obese individuals, not met.   Monitor:  Respiratory status, weight trend, labs, TF initiation/tolerance   ASSESSMENT: 33 years old morbid obese patient with PMH relevant for HTN, OSA, asthma. Underwent laparoscopic vertical sleeve gastrectomy on 07/14/13 ([place]). Pt required intubation Chest X ray with bilateral diffuse patchy infiltrates compatible with ARDS vs cardiogenic pulmonary edema.   Patient is currently intubated on ventilator support MV: 8 L/min Temp (24hrs), Avg:100.1 F (37.8 C), Min:99.5 F (37.5 C), Max:100.6 F (38.1 C)  Propofol: 38.3 ml/hr providing: 1011 kcal per day from lipid  Consult received for TPN. CCS consulted to determine possible leak.  TPN to start infusing @ 40 ml/hr of Clinimix E 5/15 without lipids. Provides: 681 kcal and 48 grams protein.   Height: Ht Readings from Last 1 Encounters:  07/18/13 5' 4.17" (1.63 m)    Weight: Wt Readings from Last 1 Encounters:  07/18/13 401 lb 14.4 oz (182.3 kg)    Ideal Body Weight: 54.5 kg   BMI:  Body mass index is 68.61 kg/(m^2).  Estimated Nutritional Needs: Kcal: 2759 Goal Kcal: 1650-1850 (60-67% of needs) will need to subtract total kcal provided from propofol   Protein: >/= 136 grams Fluid: 1.5 L/day  Skin: abdominal incision  Diet Order:  NPO   Intake/Output Summary (Last 24 hours) at 07/19/13 1531 Last data filed at 07/19/13 1100  Gross per 24 hour  Intake 2790.25 ml  Output    942 ml  Net 1848.25 ml    Last BM: PTA   Labs:   Recent Labs Lab 07/18/13 0233 07/18/13 0255 07/19/13 0350  NA 140 143 144  K 4.7 4.4 3.8  CL 99 103 103  CO2 18*  --  32  BUN 25* 25* 15  CREATININE 0.98 1.10 0.81  CALCIUM 9.2  --  8.1*  GLUCOSE 142* 141* 137*    CBG (last 3)   Recent Labs  07/18/13 0854 07/18/13 1241 07/19/13 1443  GLUCAP 119* 133* 108*   Lab Results  Component Value Date   TRIG 274* 07/19/2013    Scheduled Meds: . albuterol  2.5 mg Nebulization Q6H  . antiseptic oral rinse  15 mL Mouth Rinse QID  . budesonide  0.25 mg Nebulization 4 times per day  . chlorhexidine  15 mL Mouth Rinse BID  . fentaNYL  50 mcg Intravenous Once  . heparin  5,000 Units Subcutaneous 3 times per day  . insulin aspart  2-6 Units Subcutaneous 6 times per day  . pantoprazole (PROTONIX) IV  40 mg Intravenous QHS  . piperacillin-tazobactam (ZOSYN)  IV  3.375 g Intravenous 3 times per day  . potassium chloride  40 mEq Per Tube TID  . vancomycin  1,750 mg Intravenous Q12H    Continuous Infusions: . Marland KitchenTPN (CLINIMIX-E) Adult    . fentaNYL infusion INTRAVENOUS 200 mcg/hr (07/19/13 1349)  . propofol 35 mcg/kg/min (07/19/13 1500)    Melissa Erickson RD,  [place], [place] Pager [phone] After Hours Pager

## 2013-07-19 NOTE — Op Note (Addendum)
LAPAROSCOPY DIAGNOSTIC, EXPLORATORY LAPAROTOMY, ESOPHAGOGASTRODUODENOSCOPY (EGD)  Procedure Note  Melissa Erickson 07/18/2013 - 07/19/2013   Pre-op Diagnosis: Rule out leak from gastric sleeve     Post-op Diagnosis: same, no evidence of leak  Procedure(s): LAPAROSCOPY DIAGNOSTIC ESOPHAGOGASTRODUODENOSCOPY (EGD)  Surgeon(s): Harl Bowie, MD Shann Medal, MD  Anesthesia: General  Staff:  Circulator: Caryn Bee, RN; Hassell Halim, RN; Norberto Sorenson, RN; Shawnie Dapper, RN; Dory Peru, RN Scrub Person: Leslie Andrea, CST  Estimated Blood Loss: Minimal                         Harl Bowie   Date: 07/19/2013  Time: 5:16 PM

## 2013-07-19 NOTE — Consult Note (Signed)
I have seen and examined the patient and agree with the assessment and plans. I have discussed this with our own bariatric surgeons who feel that despite the CT scan, we still need to prove that there is no leak from the gastric sleeve.  Will plan emergent diagnostic laparoscopy, possible laparotomy and possible EGD. I discussed the risks which include but are not limited to worsening pulmonary function, finding no leak, injury to surrounding structures, bleeding, worsening infection, death.  Enid Maultsby A. Ninfa Linden  MD, FACS

## 2013-07-19 NOTE — Op Note (Addendum)
Name:  Melissa Erickson MRN: [id number] Date of Surgery: 07/19/2013  Preop Diagnosis:  Morbid Obesity, Sepsis, possible leak from sleeve gastrectomy  Postop Diagnosis:  Morbid Obesity (weight - 400, BMI - 68.7) , No evidence of leak from sleeve gastrectomy  Procedure:  Upper endoscopy  (Intraoperative)  Surgeon:  [name], M.D.  Anesthesia:  GET  Indications for procedure: [name] is a 33 y.o. female whose primary care physician is ANDY,CAMILLE L, MD.  I am doing an intraoperative upper endoscopy to evaluate the gastric pouch.    The patient had a gastric sleeve on 07/14/2013 by Dr. Azucena Freed, Lynnette Caffey..  The patient presented acutely ill on 07/18/2013 to [facility] ER.  Dr. Evlyn Courier was consulted today about the patient.  It was felt best for the patient to do a laparoscopic exploration to make sure that there was no evidence of leak from the sleeve gastrectomy.  Operative Note: The patient is under general anesthesia.  Dr. Ninfa Linden is laparoscoping the patient while I do an upper endoscopy to evaluate the stomach pouch.  With the patient intubated, I passed the Pentax upper endoscope without difficulty down the esophagus.  The esophagus was normal.  The esophago-gastric junction was at 38 cm.    The mucosa of the stomach looked viable and the staple line was intact without bleeding.  I advanced to the pylorus, but did not go through it.  The patient had a normal appearing post sleeve gastrectomy antrum.    While I insufflated the stomach pouch with air, Dr. Ninfa Linden  flooded the upper abdomen with saline to put the gastric pouch under saline.  There was no bubbling or evidence of a leak.  Photos were taken of the gastric pouch.  There was no evidence of narrowing of the pouch and the gastric sleeve looked tubular.  There was some "pouch effect" proximal in the sleeve (just beyond the EG junction) with retained fluid in the pouch.  This was not that large.  The  scope was then withdrawn.  The esophagus was unremarkable and the patient tolerated the endoscopy without difficulty.  I passed an NGT at the end of the case without difficulty.  This was done with laparoscopic visualization.  She is still in critical condition, but the sleeve gastrectomy does not appear to be the source of her acute sepsis.  [name], MD, [facility] Surgery Pager: [phone] Office phone:  [phone]

## 2013-07-19 NOTE — Progress Notes (Signed)
Discussed case with surgery, concern for a leak.  Will order a CT of the abdomen and pelvis with IV contrast only to r/o leak.  Surgery consult also called.  ABG noted with improvement in oxygenation but CO2 is 18 and bicarb is very low.  Vent adjusted accordingly.  Bicarb very low on ABG, concern for evolving acidosis post op.  Continue abx.  No NGT placement per surgery.  Maintain NPO and start TPN.  F/U troponin at 0.98.  EKG ordered and cardiology consult called.  Additional CC time of 45 min.  Rush Farmer, M.D. [facility] Pulmonary/Critical Care Medicine. Pager: [phone]. After hours pager: [phone].

## 2013-07-19 NOTE — Progress Notes (Signed)
CRITICAL VALUE ALERT  Critical value received:  Troponin I 0.98  Date of notification:  07/19/2013  Time of notification:  0938  Critical value read back:yes  Nurse who received alert:  J.Karie Georges, RN  MD notified (1st page):  Dr. Nelda Marseille   Time of first page:  0940  MD notified (2nd page):  Time of second page:  Responding MD:  Dr. Nelda Marseille   Time MD responded:  [phone]

## 2013-07-19 NOTE — Progress Notes (Addendum)
Spoke with Dr. Evlyn Courier about Melissa Erickson who had a sleeve gastrectomy on 07/14/2013 by Dr. Azucena Freed (office:  [phone], cell: [phone])  Discussed case with Dr. Griffith Citron - his method includes:  No drain  Staple with peristrips  Lays omentum along staple line, but not sutured  Test with 38 F gastric lavage tube he uses to size the sleeve.  He puts blue dye down the tube.  He did not endoscope.  Brought stomach out through LUQ 15 mm trocar    If leak - he would sew and patch and drain, no J tube, TPN for nutrition.  [name], MD, [facility] Surgery Pager: [phone] Office phone:  [phone]

## 2013-07-19 NOTE — Progress Notes (Signed)
PARENTERAL NUTRITION CONSULT NOTE - INITIAL  Pharmacy Consult for TPN Indication: s/p gastric surgery  No Known Allergies  Patient Measurements: Height: 5' 4.17" (163 cm) Weight:  (bed error) IBW/kg (Calculated) : 55.1 Adjusted Body Weight:  Usual Weight:   Vital Signs: Temp: 100.2 F (37.9 C) (06/02 1100) BP: 124/67 mmHg (06/02 1100) Pulse Rate: 111 (06/02 1100) Intake/Output from previous day: 06/01 0701 - 06/02 0700 In: 3113.1 [I.V.:1913.1; IV Piggyback:1200] Out: 1202 [Urine:1202] Intake/Output from this shift: Total I/O In: 371.5 [I.V.:371.5] Out: 165 [Urine:165]  Labs:  Recent Labs  07/18/13 0233 07/18/13 0255 07/19/13 0350 07/19/13 1022  WBC 18.7*  --  10.6*  --   HGB 14.1 17.0* 11.7*  --   HCT 44.6 50.0* 38.6  --   PLT 281  --  189  --   INR 1.51*  --   --  1.45     Recent Labs  07/18/13 0233 07/18/13 0255 07/18/13 0915 07/19/13 0350  NA 140 143  --  144  K 4.7 4.4  --  3.8  CL 99 103  --  103  CO2 18*  --   --  32  GLUCOSE 142* 141*  --  137*  BUN 25* 25*  --  15  CREATININE 0.98 1.10  --  0.81  CALCIUM 9.2  --   --  8.1*  PROT 7.9  --   --  6.5  ALBUMIN 3.3*  --   --  2.8*  AST 348*  --   --  165*  ALT 418*  --   --  483*  ALKPHOS 70  --   --  55  BILITOT 1.1  --   --  0.6  TRIG  --   --  218* 274*   Estimated Creatinine Clearance: 163.8 ml/min (by C-G formula based on Cr of 0.81).    Recent Labs  07/18/13 0854 07/18/13 1241  GLUCAP 119* 133*    Medical History: Past Medical History  Diagnosis Date  . Polycystic ovarian syndrome   . Asthma     Medications:  Prescriptions prior to admission  Medication Sig Dispense Refill  . ADVAIR DISKUS 100-50 MCG/DOSE AEPB Inhale 2 puffs into the lungs 2 (two) times daily.       Marland Kitchen albuterol (PROVENTIL HFA;VENTOLIN HFA) 108 (90 BASE) MCG/ACT inhaler Inhale 2 puffs into the lungs every 6 (six) hours as needed for wheezing.        Insulin Requirements in the past 24 hours:     Current Nutrition:  NPO  Assessment: 33 y/o morbidly obese black female underwent a laparoscopic vertical sleeve gastrectomy on 07/14/13 in [place]. 6/1 pt c/o severe back pain with hypoxia and diffuse patchy infiltrates c/w ARDS vs pulmonary edema on PCXR. Troponins are elevated and she's has VDRF, elevated LFT's, elevatd INR 1.5.   Gastrointestinal / Nutrition: laparoscopic vertical sleeve gastrectomy on 07/14/13. No NG or OG due to surgery. AST/ALT elevated 165/483 IV PPI. Triglycerides 274  Anticoagulation: SQ heparin for DVT prophx.  Infectious Disease: Tmax 100.4. WBC 10.6 on Vanco/Zosyn for PNA.  Cardiovascular: Elevated troponins may be due to demand. ProBNP>3500. EF 50-55%.  Endocrinology: stress-induced hyperglycemia.  Neurology: Heavily sedated due to vent dyssynchrony. Fent/Propofol (currently 38.19ml/hr)  Nephrology: Scr 0.81. KDur 40 x 2 doses today.  Pulmonary: VDRF. Budesoneide/Albuterol, mouth care  Hematology / Oncology: Hgb low at 11.7 but just had recent surgery.  PTA Medication Issues  Best Practices: MC, SQ heparin, IV PPI  Lines: R  IJ CVL 6/01 >>   Nutritional Goals:  2807 kcal/d, >137 grams of protein per day  Options: 1. Clinimix E 5/20 (173ml/hr)=2803 kcal and 132g protein daily 2. Clinimix E 5/15 (120 ml/hr)=2525 kcal and 144g protein daily  Plan:  1. R/o abdominal leak s/p surgery. 2. R/o PE. 3. Propofol at 38.3 ml/hr 4. Start Clinimix E 5/15 at 23ml/hr. 5. No lipids due to current propofol use and elevated TG 6. Start SSI and CBG checks   Kemet Nijjar S. Alford Highland, PharmD, [facility] Clinical Staff Pharmacist Pager [phone]  Coralyn Pear Alford Highland 07/19/2013,12:25 PM

## 2013-07-19 NOTE — Progress Notes (Signed)
Pt. returned from OR, uneventful.

## 2013-07-19 NOTE — Transfer of Care (Signed)
Immediate Anesthesia Transfer of Care Note  Patient: Ashtin R Drumgoole  Procedure(s) Performed: Procedure(s): LAPAROSCOPY DIAGNOSTIC (N/A) ESOPHAGOGASTRODUODENOSCOPY (EGD) (N/A)  Patient Location: ICU  Anesthesia Type:General  Level of Consciousness: sedated, unresponsive and Patient remains intubated per anesthesia plan  Airway & Oxygen Therapy: Patient remains intubated per anesthesia plan and Patient placed on Ventilator (see vital sign flow sheet for setting)  Post-op Assessment: Report given to PACU RN and Post -op Vital signs reviewed and stable  Post vital signs: Reviewed and stable  Complications: No apparent anesthesia complications

## 2013-07-19 NOTE — Progress Notes (Signed)
Pt. arrived back to 27m11 after CT scan, uneventful transport.

## 2013-07-20 ENCOUNTER — Inpatient Hospital Stay (HOSPITAL_COMMUNITY): Payer: BC Managed Care – PPO

## 2013-07-20 DIAGNOSIS — I214 Non-ST elevation (NSTEMI) myocardial infarction: Secondary | ICD-10-CM

## 2013-07-20 DIAGNOSIS — R778 Other specified abnormalities of plasma proteins: Secondary | ICD-10-CM | POA: Diagnosis present

## 2013-07-20 DIAGNOSIS — I252 Old myocardial infarction: Secondary | ICD-10-CM | POA: Insufficient documentation

## 2013-07-20 DIAGNOSIS — R7989 Other specified abnormal findings of blood chemistry: Secondary | ICD-10-CM

## 2013-07-20 DIAGNOSIS — J9600 Acute respiratory failure, unspecified whether with hypoxia or hypercapnia: Secondary | ICD-10-CM

## 2013-07-20 LAB — BLOOD GAS, ARTERIAL
Acid-Base Excess: 5.6 mmol/L — ABNORMAL HIGH (ref 0.0–2.0)
Bicarbonate: 30.8 mEq/L — ABNORMAL HIGH (ref 20.0–24.0)
DRAWN BY: 36496
FIO2: 0.6 %
O2 Saturation: 95.8 %
PCO2 ART: 56 mmHg — AB (ref 35.0–45.0)
PEEP/CPAP: 12 cmH2O
PH ART: 7.359 (ref 7.350–7.450)
PO2 ART: 80.8 mmHg (ref 80.0–100.0)
PRESSURE CONTROL: 14 cmH2O
Patient temperature: 98.6
RATE: 18 resp/min
TCO2: 32.5 mmol/L (ref 0–100)

## 2013-07-20 LAB — POCT I-STAT 3, ART BLOOD GAS (G3+)
Acid-Base Excess: 13 mmol/L — ABNORMAL HIGH (ref 0.0–2.0)
BICARBONATE: 36.9 meq/L — AB (ref 20.0–24.0)
O2 Saturation: 95 %
PCO2 ART: 50 mmHg — AB (ref 35.0–45.0)
PH ART: 7.484 — AB (ref 7.350–7.450)
PO2 ART: 79 mmHg — AB (ref 80.0–100.0)
Patient temperature: 39.1
TCO2: 38 mmol/L (ref 0–100)

## 2013-07-20 LAB — COMPREHENSIVE METABOLIC PANEL
ALT: 295 U/L — AB (ref 0–35)
AST: 60 U/L — ABNORMAL HIGH (ref 0–37)
Albumin: 2.6 g/dL — ABNORMAL LOW (ref 3.5–5.2)
Alkaline Phosphatase: 53 U/L (ref 39–117)
BUN: 11 mg/dL (ref 6–23)
CALCIUM: 7.8 mg/dL — AB (ref 8.4–10.5)
CO2: 31 mEq/L (ref 19–32)
Chloride: 103 mEq/L (ref 96–112)
Creatinine, Ser: 0.72 mg/dL (ref 0.50–1.10)
GFR calc Af Amer: >90 mL/min (ref >90)
GFR calc non Af Amer: >90 mL/min (ref >90)
GLUCOSE: 124 mg/dL — AB (ref 70–99)
Potassium: 4.3 mEq/L (ref 3.7–5.3)
SODIUM: 142 meq/L (ref 137–147)
TOTAL PROTEIN: 6.3 g/dL (ref 6.0–8.3)
Total Bilirubin: 0.5 mg/dL (ref 0.3–1.2)

## 2013-07-20 LAB — GLUCOSE, CAPILLARY
GLUCOSE-CAPILLARY: 100 mg/dL — AB (ref 70–99)
GLUCOSE-CAPILLARY: 104 mg/dL — AB (ref 70–99)
GLUCOSE-CAPILLARY: 130 mg/dL — AB (ref 70–99)
Glucose-Capillary: 116 mg/dL — ABNORMAL HIGH (ref 70–99)
Glucose-Capillary: 125 mg/dL — ABNORMAL HIGH (ref 70–99)
Glucose-Capillary: 121 mg/dL — ABNORMAL HIGH (ref 70–99)

## 2013-07-20 LAB — CBC
HEMATOCRIT: 34.9 % — AB (ref 36.0–46.0)
Hemoglobin: 10.6 g/dL — ABNORMAL LOW (ref 12.0–15.0)
MCH: 27.5 pg (ref 26.0–34.0)
MCHC: 30.4 g/dL (ref 30.0–36.0)
MCV: 90.6 fL (ref 78.0–100.0)
PLATELETS: 151 10*3/uL (ref 150–400)
RBC: 3.85 MIL/uL — ABNORMAL LOW (ref 3.87–5.11)
RDW: 14.7 % (ref 11.5–15.5)
WBC: 9.6 10*3/uL (ref 4.0–10.5)

## 2013-07-20 LAB — VANCOMYCIN, TROUGH: Vancomycin Tr: 10.3 ug/mL (ref 10.0–20.0)

## 2013-07-20 LAB — PREALBUMIN: PREALBUMIN: 6.3 mg/dL — AB (ref 17.0–34.0)

## 2013-07-20 LAB — PHOSPHORUS: Phosphorus: 2.3 mg/dL (ref 2.3–4.6)

## 2013-07-20 LAB — MAGNESIUM: Magnesium: 2.4 mg/dL (ref 1.5–2.5)

## 2013-07-20 MED ORDER — ACETAMINOPHEN 325 MG PO TABS
650.0000 mg | ORAL_TABLET | Freq: Four times a day (QID) | ORAL | Status: DC | PRN
  Administered 2013-07-20 – 2013-07-21 (×5): 650 mg via ORAL
  Filled 2013-07-20 (×5): qty 2

## 2013-07-20 MED ORDER — MIDAZOLAM HCL 2 MG/2ML IJ SOLN
1.0000 mg | INTRAMUSCULAR | Status: DC | PRN
  Administered 2013-07-20 – 2013-07-21 (×2): 2 mg via INTRAVENOUS
  Filled 2013-07-20 (×4): qty 2

## 2013-07-20 MED ORDER — VITAL HIGH PROTEIN PO LIQD
1000.0000 mL | ORAL | Status: DC
  Administered 2013-07-20 (×2): 1000 mL
  Administered 2013-07-21: 12:00:00
  Administered 2013-07-21: 1000 mL
  Administered 2013-07-21 (×3)
  Administered 2013-07-21: 1000 mL
  Administered 2013-07-21: 11:00:00
  Administered 2013-07-21: 1000 mL
  Administered 2013-07-21 – 2013-07-22 (×4)
  Filled 2013-07-20 (×8): qty 1000

## 2013-07-20 MED ORDER — VANCOMYCIN HCL 10 G IV SOLR
2000.0000 mg | Freq: Two times a day (BID) | INTRAVENOUS | Status: DC
  Administered 2013-07-20 – 2013-07-23 (×6): 2000 mg via INTRAVENOUS
  Filled 2013-07-20 (×7): qty 2000

## 2013-07-20 MED ORDER — FUROSEMIDE 10 MG/ML IJ SOLN
40.0000 mg | Freq: Four times a day (QID) | INTRAMUSCULAR | Status: AC
  Administered 2013-07-20 (×3): 40 mg via INTRAVENOUS
  Filled 2013-07-20 (×3): qty 4

## 2013-07-20 MED ORDER — DEXMEDETOMIDINE HCL IN NACL 200 MCG/50ML IV SOLN
0.4000 ug/kg/h | INTRAVENOUS | Status: DC
  Administered 2013-07-20 (×3): 0.5 ug/kg/h via INTRAVENOUS
  Administered 2013-07-20: 0.4 ug/kg/h via INTRAVENOUS
  Administered 2013-07-20 (×2): 0.6 ug/kg/h via INTRAVENOUS
  Administered 2013-07-20: 0.5 ug/kg/h via INTRAVENOUS
  Administered 2013-07-20: 0.6 ug/kg/h via INTRAVENOUS
  Administered 2013-07-21 (×2): 0.4 ug/kg/h via INTRAVENOUS
  Administered 2013-07-21 (×2): 0.5 ug/kg/h via INTRAVENOUS
  Administered 2013-07-21 (×3): 0.4 ug/kg/h via INTRAVENOUS
  Administered 2013-07-21 (×2): 0.5 ug/kg/h via INTRAVENOUS
  Administered 2013-07-22: 0.4 ug/kg/h via INTRAVENOUS
  Administered 2013-07-22: 0.8 ug/kg/h via INTRAVENOUS
  Administered 2013-07-22 (×2): 0.4 ug/kg/h via INTRAVENOUS
  Filled 2013-07-20 (×6): qty 50
  Filled 2013-07-20: qty 100
  Filled 2013-07-20 (×9): qty 50
  Filled 2013-07-20: qty 100
  Filled 2013-07-20 (×2): qty 50
  Filled 2013-07-20: qty 100
  Filled 2013-07-20: qty 50

## 2013-07-20 NOTE — Progress Notes (Signed)
PULMONARY / CRITICAL CARE MEDICINE   Name: Melissa Erickson MRN: [id number] DOB: 09/08/1980    ADMISSION DATE:  07/18/2013   BRIEF PATIENT DESCRIPTION:  45 F with hx of morbid obesity, htn, OSA, asthma underwent laparoscopic vertical sleeve gastrectomy 5/28 in [place], [place]. Was discharged home the following day. Adm to PCCM service early AM 6/01 after presenting with acute hypoxic failure requiring intubation shortly after arrival.  SIGNIFICANT EVENTS / STUDIES:  6/01 Echocardiogram: LVEF 50-55%. Mild LVH. Grade 1 diastolic dysfxn 6/31 LE venous Dopplers: no evidence of DVT  LINES / TUBES: ETT 6/01 >>  L radial A line 6/01 >>  R IJ CVL 6/01 >>  NGT 6/2 by surgery in the OR, if out then IR can replace>>>  CULTURES: Urine 6/01 >>  Resp 6/01 >>  Blood 6/01 >>   ANTIBIOTICS: Vanc 6/01 >>  Zosyn 6/01 >>   SUBJECTIVE:  Heavily sedated this AM had to be paralyzed overnight due to asynchrony  VITAL SIGNS: Temp:  [98.2 F (36.8 C)-100.6 F (38.1 C)] 100.6 F (38.1 C) (06/03 0800) Pulse Rate:  [100-113] 103 (06/03 0800) Resp:  [9-33] 24 (06/03 0800) BP: (113-140)/(53-79) 126/64 mmHg (06/03 0800) SpO2:  [95 %-100 %] 100 % (06/03 0800) Arterial Line BP: (90-167)/(59-91) 112/59 mmHg (06/03 0800) FiO2 (%):  [40 %-60 %] 60 % (06/03 0800)  HEMODYNAMICS:   VENTILATOR SETTINGS: Vent Mode:  [-] PCV FiO2 (%):  [40 %-60 %] 60 % Set Rate:  [18 bmp] 18 bmp Vt Set:  [360 mL] 360 mL PEEP:  [10 cmH20-12 cmH20] 12 cmH20 Plateau Pressure:  [25 cmH20-30 cmH20] 26 cmH20 INTAKE / OUTPUT: Intake/Output     06/02 0701 - 06/03 0700 06/03 0701 - 06/04 0700   I.V. (mL/kg) 2627.5 (14.4)    NG/GT 30    IV Piggyback 1150    Total Intake(mL/kg) 3807.5 (20.9)    Urine (mL/kg/hr) 1223 (0.3)    Emesis/NG output 90 (0)    Blood 30 (0)    Total Output 1343     Net +2464.5           PHYSICAL EXAMINATION: General:  Morbidly obese, intubated, sedated Neuro: PERRL, rest of exam limited HEENT:  NCAT Cardiovascular: distant HS, regular, no M Lungs: no wheezes, distant BS Abdomen: Soft, 3 small surgical wounds with staples present, no purulence, BS diminished Ext: warm, no edema  LABS:  CBC  Recent Labs Lab 07/18/13 0233 07/18/13 0255 07/19/13 0350 07/20/13 0500  WBC 18.7*  --  10.6* 9.6  HGB 14.1 17.0* 11.7* 10.6*  HCT 44.6 50.0* 38.6 34.9*  PLT 281  --  189 151   Coag's  Recent Labs Lab 07/18/13 0233 07/19/13 1022  INR 1.51* 1.45   BMET  Recent Labs Lab 07/18/13 0233 07/18/13 0255 07/19/13 0350 07/20/13 0500  NA 140 143 144 142  K 4.7 4.4 3.8 4.3  CL 99 103 103 103  CO2 18*  --  32 31  BUN 25* 25* 15 11  CREATININE 0.98 1.10 0.81 0.72  GLUCOSE 142* 141* 137* 124*   Electrolytes  Recent Labs Lab 07/18/13 0233 07/19/13 0350 07/20/13 0500  CALCIUM 9.2 8.1* 7.8*  MG  --   --  2.4  PHOS  --   --  2.3   Sepsis Markers  Recent Labs Lab 07/18/13 0254  LATICACIDVEN 7.29*   ABG  Recent Labs Lab 07/19/13 0845 07/19/13 1041 07/20/13 0448  PHART 7.413 7.343* 7.359  PCO2ART 18.0* 57.3* 56.0*  PO2ART 129.0* 56.5* 80.8   Liver Enzymes  Recent Labs Lab 07/18/13 0233 07/19/13 0350 07/20/13 0500  AST 348* 165* 60*  ALT 418* 483* 295*  ALKPHOS 70 55 53  BILITOT 1.1 0.6 0.5  ALBUMIN 3.3* 2.8* 2.6*   Cardiac Enzymes  Recent Labs Lab 07/18/13 0233 07/19/13 0855  TROPONINI  --  0.98*  PROBNP 3595.0*  --    Glucose  Recent Labs Lab 07/18/13 0854 07/18/13 1241 07/19/13 1443 07/19/13 2000 07/19/13 2343 07/20/13 0351  GLUCAP 119* 133* 108* 114* 100* 104*   CXR: severe ARDS pattern  ASSESSMENT / PLAN:  PULMONARY A: Acute resp failure ARDS Very asynchronous with vent P:   - Cont full vent support - PCV due to asynchrony, much improved with PCV, adjust vent for ABG. - Hold SBT and WUA while on high PEEP/FiO2. - Cont vent bundle - F/U ABG and CXR in AM.  CARDIOVASCULAR A:  Sinus tachycardia - reactive Troponin  mildly positive. P:  - Trop elevated, cardiology feels is demand ischemia and would continue current measurement. - No anti-coag as not ACS. - Diastolic heart failure, diurese.  RENAL A:   AKI, nonoliguric, improved. P:   - Monitor BMET intermittently. - Monitor I/Os. - Correct electrolytes as indicated. - Lasix 40 mg IV q6 x3 doses.  GASTROINTESTINAL A:   Severe obesity S/P bariatric surgery 5/28 To OR on 6/2, no leak from sleeve. P:   - SUP: IV PPI. - Start TF per surgery. - NGT placed by surgery in the OR, if patient is to pull out then IR can replace does not need to be in the OR.  HEMATOLOGIC A:   No issues P:  - DVT px: SQ heparin. - Monitor CBC intermittently. - Transfuse per usual ICU guidelines.  INFECTIOUS A:  Severe sepsis/SIRS P:   - Micro and abx as above.  ENDOCRINE A:   Mild stress induced hyperglycemia without prior hx of DM P:   - Monitor glu on chem panels. - SSI for glu > 180 specially now that TF are starting.  NEUROLOGIC A:   ICU associated discomfort Ventilator dyssynchrony P:   - RASS goal: -3 to -4 - PAD protocol - Avoid further NMB. - D/C propofol, start precedex drip and PRN versed  TODAY'S SUMMARY:  Responded nicely to PCV, to OR 6/2 with no leak, will start TF today, active diureses today, change propofol to versed/precedex/fentanyl to avoid triglycerides elevation (high risk for pancreatitis).  I have personally obtained a history, examined the patient, evaluated laboratory and imaging results, formulated the assessment and plan and placed orders.  CRITICAL CARE: The patient is critically ill with multiple organ systems failure and requires high complexity decision making for assessment and support, frequent evaluation and titration of therapies, application of advanced monitoring technologies and extensive interpretation of multiple databases. Critical Care Time devoted to patient care services described in this note is 35  minutes.   Rush Farmer, M.D. [facility] Pulmonary/Critical Care Medicine. Pager: [phone]. After hours pager: [phone].

## 2013-07-20 NOTE — Progress Notes (Signed)
1 Day Post-Op  Subjective: POD#1 Intubated and sedated Hemodynamically stable overnight  Objective: Vital signs in last 24 hours: Temp:  [98.2 F (36.8 C)-100.4 F (38 C)] 100.4 F (38 C) (06/03 0700) Pulse Rate:  [100-113] 102 (06/03 0700) Resp:  [9-33] 25 (06/03 0700) BP: (113-141)/(53-90) 117/64 mmHg (06/03 0700) SpO2:  [95 %-100 %] 100 % (06/03 0700) Arterial Line BP: (90-167)/(59-91) 109/59 mmHg (06/03 0700) FiO2 (%):  [40 %-60 %] 60 % (06/03 0700)    Intake/Output from previous day: 06/02 0701 - 06/03 0700 In: 3807.5 [I.V.:2627.5; NG/GT:30; IV Piggyback:1150] Out: 1343 [Urine:1223; Emesis/NG output:90; Blood:30] Intake/Output this shift:    Abdomen morbidly obese, soft Dressings dry  Lab Results:   Recent Labs  07/19/13 0350 07/20/13 0500  WBC 10.6* 9.6  HGB 11.7* 10.6*  HCT 38.6 34.9*  PLT 189 151   BMET  Recent Labs  07/19/13 0350 07/20/13 0500  NA 144 142  K 3.8 4.3  CL 103 103  CO2 32 31  GLUCOSE 137* 124*  BUN 15 11  CREATININE 0.81 0.72  CALCIUM 8.1* 7.8*   PT/INR  Recent Labs  07/18/13 0233 07/19/13 1022  LABPROT 17.8* 17.3*  INR 1.51* 1.45   ABG  Recent Labs  07/19/13 1041 07/20/13 0448  PHART 7.343* 7.359  HCO3 30.3* 30.8*    Studies/Results: Ct Angio Chest Pe W/cm &/or Wo Cm  07/19/2013   CLINICAL DATA:  Status post gastric bypass surgery 5 days ago with possible leak. Decreased respiratory response.  EXAM: CT ANGIOGRAPHY CHEST  CT ABDOMEN AND PELVIS WITH CONTRAST  TECHNIQUE: Multidetector CT imaging of the chest was performed using the standard protocol during bolus administration of intravenous contrast. Multiplanar CT image reconstructions and MIPs were obtained to evaluate the vascular anatomy. Multidetector CT imaging of the abdomen and pelvis was performed using the standard protocol during bolus administration of intravenous contrast.  CONTRAST:  250mL OMNIPAQUE IOHEXOL 350 MG/ML SOLN  COMPARISON:  None.  FINDINGS: CTA  CHEST FINDINGS  Despite a second bolus of IV contrast and re-imaging, image quality is markedly degraded for the detection of segmental and subsegmental pulmonary emboli, due to body habitus. No central or lobar pulmonary embolus.  Mediastinal lymph nodes measure up to 9 mm in the AP window. No definite hilar or axillary adenopathy. Heart is enlarged. Right IJ central line terminates in the right atrium. No pericardial effusion.  Again, image quality is markedly degraded by body habitus and respiratory motion. There is multilobar patchy bilateral ground-glass with consolidation in both lower lobes. No pleural fluid.  CT ABDOMEN and PELVIS FINDINGS  Liver appears decreased in attenuation diffusely. Gallbladder, adrenal glands, kidneys, spleen and pancreas are grossly unremarkable. Image quality is degraded by body habitus and respiratory motion. Postoperative changes of gastric sleeve procedure. No small bowel dilatation. Colon is unremarkable. Uterus and ovaries are visualized. No pathologically enlarged lymph nodes. No free fluid. Small periumbilical hernia contains fat. No worrisome lytic or sclerotic lesions. There are severe degenerative changes in the thoracic and lumbar spine with flowing anterior osteophytosis.  Review of the MIP images confirms the above findings.  IMPRESSION: 1. Image quality is severely degraded by body habitus and motion. No central or lobar pulmonary embolus. 2. Multi lobar ground-glass and consolidation, indicative of edema or pneumonia. Developing adult respiratory distress syndrome (ARDS) is another consideration. 3. Liver appears fatty. 4. Postoperative changes of recent gastric sleeve procedure without acute finding in the abdomen or pelvis.   Electronically Signed   By:  Lorin Picket M.D.   On: 07/19/2013 14:05   Ct Abdomen Pelvis W Contrast  07/19/2013   CLINICAL DATA:  Status post gastric bypass surgery 5 days ago with possible leak. Decreased respiratory response.  EXAM: CT  ANGIOGRAPHY CHEST  CT ABDOMEN AND PELVIS WITH CONTRAST  TECHNIQUE: Multidetector CT imaging of the chest was performed using the standard protocol during bolus administration of intravenous contrast. Multiplanar CT image reconstructions and MIPs were obtained to evaluate the vascular anatomy. Multidetector CT imaging of the abdomen and pelvis was performed using the standard protocol during bolus administration of intravenous contrast.  CONTRAST:  249mL OMNIPAQUE IOHEXOL 350 MG/ML SOLN  COMPARISON:  None.  FINDINGS: CTA CHEST FINDINGS  Despite a second bolus of IV contrast and re-imaging, image quality is markedly degraded for the detection of segmental and subsegmental pulmonary emboli, due to body habitus. No central or lobar pulmonary embolus.  Mediastinal lymph nodes measure up to 9 mm in the AP window. No definite hilar or axillary adenopathy. Heart is enlarged. Right IJ central line terminates in the right atrium. No pericardial effusion.  Again, image quality is markedly degraded by body habitus and respiratory motion. There is multilobar patchy bilateral ground-glass with consolidation in both lower lobes. No pleural fluid.  CT ABDOMEN and PELVIS FINDINGS  Liver appears decreased in attenuation diffusely. Gallbladder, adrenal glands, kidneys, spleen and pancreas are grossly unremarkable. Image quality is degraded by body habitus and respiratory motion. Postoperative changes of gastric sleeve procedure. No small bowel dilatation. Colon is unremarkable. Uterus and ovaries are visualized. No pathologically enlarged lymph nodes. No free fluid. Small periumbilical hernia contains fat. No worrisome lytic or sclerotic lesions. There are severe degenerative changes in the thoracic and lumbar spine with flowing anterior osteophytosis.  Review of the MIP images confirms the above findings.  IMPRESSION: 1. Image quality is severely degraded by body habitus and motion. No central or lobar pulmonary embolus. 2. Multi  lobar ground-glass and consolidation, indicative of edema or pneumonia. Developing adult respiratory distress syndrome (ARDS) is another consideration. 3. Liver appears fatty. 4. Postoperative changes of recent gastric sleeve procedure without acute finding in the abdomen or pelvis.   Electronically Signed   By: Lorin Picket M.D.   On: 07/19/2013 14:05   Dg Chest Port 1 View  07/20/2013   CLINICAL DATA:  Hypoxia  EXAM: PORTABLE CHEST - 1 VIEW  COMPARISON:  July 19, 2013 chest radiograph and chest CT  FINDINGS: The endotracheal tube tip is 3.7 cm above the carina. Nasogastric tube and side port are below the diaphragm. No pneumothorax. There is patchy alveolar edema bilaterally, not felt to be changed. No new opacity is seen. Heart is mildly enlarged with pulmonary vascularity within normal limits. No adenopathy.  IMPRESSION: Tube positions as described without pneumothorax. Patchy edema remains without change. No new opacity seen.   Electronically Signed   By: Lowella Grip M.D.   On: 07/20/2013 07:36   Dg Chest Port 1 View  07/19/2013   CLINICAL DATA:  Respiratory failure.  EXAM: PORTABLE CHEST - 1 VIEW  COMPARISON:  Chest x-ray 07/18/2013.  FINDINGS: Endotracheal tube is been withdrawn. Is tip is approximately 2.1 cm above the carina. Right IJ line in stable position. Stable cardiomegaly. Stable diffuse bilateral pulmonary infiltrates. Differential diagnosis includes pulmonary edema and/or pneumonia. No pleural effusion or pneumothorax.  IMPRESSION: 1. Interval withdrawal of endotracheal tube from right mainstem bronchus. The tip is now 2.1 cm above the carina. Right IJ line in  stable position. 2. Persistent severe bilateral pulmonary alveolar infiltrates, unchanged. Differential diagnosis includes pulmonary edema and/or pneumonia. 3. Persistent cardiomegaly.   Electronically Signed   By: [place]   On: 07/19/2013 07:34   Dg Abd Portable 1v  07/19/2013   CLINICAL DATA:  Post nasogastric tube  placement.  EXAM: PORTABLE ABDOMEN - 1 VIEW  COMPARISON:  CT abdomen pelvis of same day.  FINDINGS: Nasogastric tube terminates in the gastric antrum. Mild gaseous distention of colon. Overall image quality is degraded by body habitus.  IMPRESSION: Satisfactory nasogastric tube placement.   Electronically Signed   By: Lorin Picket M.D.   On: 07/19/2013 19:19    Anti-infectives: Anti-infectives   Start     Dose/Rate Route Frequency Ordered Stop   07/18/13 1800  vancomycin (VANCOCIN) 1,750 mg in sodium chloride 0.9 % 500 mL IVPB     1,750 mg 250 mL/hr over 120 Minutes Intravenous Every 12 hours 07/18/13 0514     07/18/13 1400  piperacillin-tazobactam (ZOSYN) IVPB 3.375 g  Status:  Discontinued     3.375 g 12.5 mL/hr over 240 Minutes Intravenous 3 times per day 07/18/13 0514 07/18/13 0710   07/18/13 0800  piperacillin-tazobactam (ZOSYN) IVPB 3.375 g     3.375 g 12.5 mL/hr over 240 Minutes Intravenous 3 times per day 07/18/13 0710     07/18/13 0515  vancomycin (VANCOCIN) 2,500 mg in sodium chloride 0.9 % 500 mL IVPB     2,500 mg 250 mL/hr over 120 Minutes Intravenous  Once 07/18/13 0508 07/18/13 0948   07/18/13 0315  vancomycin (VANCOCIN) IVPB 1000 mg/200 mL premix  Status:  Discontinued     1,000 mg 200 mL/hr over 60 Minutes Intravenous  Once 07/18/13 0312 07/18/13 0508   07/18/13 0315  piperacillin-tazobactam (ZOSYN) IVPB 3.375 g  Status:  Discontinued     3.375 g 100 mL/hr over 30 Minutes Intravenous  Once 07/18/13 0312 07/18/13 0710      Assessment/Plan: s/p Procedure(s): LAPAROSCOPY DIAGNOSTIC (N/A) ESOPHAGOGASTRODUODENOSCOPY (EGD) (N/A)  S/p diagnostic laparoscopy with no evidence of leak from the gastric sleeve.  From a surgery standpoint tube feeds can be started through the NG.  If the NG inadvertently comes out, it would have to be replace by IR. It is also fine from a surgery standpoint to start any anticoagulation if necessary.  LOS: 2 days    Harl Bowie 07/20/2013

## 2013-07-20 NOTE — Progress Notes (Signed)
Subjective: 33 year old female we were asked to consult on secondary to elevated troponin pk of 0.98, felt to be consistent with NSTEMI type 2 secondary to demand supply mismatch in setting of respiratory failure is the most likely etiology. Doubt ACS related to coronary disease.  Also with acute diastolic heart failure. BNP > 3500 and echo with diastolic abnormality with preserved EF, pulmonary edema on x-ray (ARDS, Aspiration, Pulmonary embolism, seems unlikely but not excluded, morbid obesity with laparoscopic vertical sleeve gastrectomy on 07/14/13, OSA  and HTN.  She had expl lap yesterday 07/19/13 and had no leak from gastric sleeve.  Was hypoxic on admit and required intubation. CXR compatible with ARDS vs cardiogenic pulmonary edema. On Echo EF 50-55% with grade I diastolic dysfunction.  Objective: Vital signs in last 24 hours: Temp:  [98.2 F (36.8 C)-100.9 F (38.3 C)] 100.8 F (38.2 C) (06/03 1121) Pulse Rate:  [99-110] 106 (06/03 1000) Resp:  [0-25] 0 (06/03 1000) BP: (113-140)/(55-79) 133/75 mmHg (06/03 1000) SpO2:  [95 %-100 %] 100 % (06/03 1000) Arterial Line BP: (90-135)/(58-72) 110/61 mmHg (06/03 1000) FiO2 (%):  [60 %] 60 % (06/03 0919) Weight change:    Intake/Output from previous day: +2512  06/02 0701 - 06/03 0700 In: 3807.5 [I.V.:2627.5; NG/GT:30; IV Piggyback:1150] Out: [id number] [Urine:1223; Emesis/NG output:90; Blood:30] Intake/Output this shift: Total I/O In: 208.8 [I.V.:208.8] Out: 1850 [Urine:1850]  PE: General:sedated Skin:Warm and dry, brisk capillary refill HEENT:normocephalic, sclera clear, mucus membranes moist, intubated Heart:S1S2 RRR without murmur, gallup, rub or click Lungs:decreased breath sounds without rales, rhonchi, or wheezes [id number], non tender, + BS, do not palpate liver spleen or masses Ext:no lower ext edema,  Neuro:sedated   Lab Results:  Recent Labs  07/19/13 0350 07/20/13 0500  WBC 10.6* 9.6  HGB 11.7* 10.6*    HCT 38.6 34.9*  PLT 189 151   BMET  Recent Labs  07/19/13 0350 07/20/13 0500  NA 144 142  K 3.8 4.3  CL 103 103  CO2 32 31  GLUCOSE 137* 124*  BUN 15 11  CREATININE 0.81 0.72  CALCIUM 8.1* 7.8*    Recent Labs  07/19/13 0855  TROPONINI 0.98*    Lab Results  Component Value Date   TRIG 274* 07/19/2013     Hepatic Function Panel  Recent Labs  07/20/13 0500  PROT 6.3  ALBUMIN 2.6*  AST 60*  ALT 295*  ALKPHOS 53  BILITOT 0.5      Studies/Results: Ct Angio Chest Pe W/cm &/or Wo Cm  07/19/2013   CLINICAL DATA:  Status post gastric bypass surgery 5 days ago with possible leak. Decreased respiratory response.  EXAM: CT ANGIOGRAPHY CHEST  CT ABDOMEN AND PELVIS WITH CONTRAST  TECHNIQUE: Multidetector CT imaging of the chest was performed using the standard protocol during bolus administration of intravenous contrast. Multiplanar CT image reconstructions and MIPs were obtained to evaluate the vascular anatomy. Multidetector CT imaging of the abdomen and pelvis was performed using the standard protocol during bolus administration of intravenous contrast.  CONTRAST:  215mL OMNIPAQUE IOHEXOL 350 MG/ML SOLN  COMPARISON:  None.  FINDINGS: CTA CHEST FINDINGS  Despite a second bolus of IV contrast and re-imaging, image quality is markedly degraded for the detection of segmental and subsegmental pulmonary emboli, due to body habitus. No central or lobar pulmonary embolus.  Mediastinal lymph nodes measure up to 9 mm in the AP window. No definite hilar or axillary adenopathy. Heart is enlarged. Right IJ central  line terminates in the right atrium. No pericardial effusion.  Again, image quality is markedly degraded by body habitus and respiratory motion. There is multilobar patchy bilateral ground-glass with consolidation in both lower lobes. No pleural fluid.  CT ABDOMEN and PELVIS FINDINGS  Liver appears decreased in attenuation diffusely. Gallbladder, adrenal glands, kidneys, spleen and  pancreas are grossly unremarkable. Image quality is degraded by body habitus and respiratory motion. Postoperative changes of gastric sleeve procedure. No small bowel dilatation. Colon is unremarkable. Uterus and ovaries are visualized. No pathologically enlarged lymph nodes. No free fluid. Small periumbilical hernia contains fat. No worrisome lytic or sclerotic lesions. There are severe degenerative changes in the thoracic and lumbar spine with flowing anterior osteophytosis.  Review of the MIP images confirms the above findings.  IMPRESSION: 1. Image quality is severely degraded by body habitus and motion. No central or lobar pulmonary embolus. 2. Multi lobar ground-glass and consolidation, indicative of edema or pneumonia. Developing adult respiratory distress syndrome (ARDS) is another consideration. 3. Liver appears fatty. 4. Postoperative changes of recent gastric sleeve procedure without acute finding in the abdomen or pelvis.   Electronically Signed   By: Lorin Picket M.D.   On: 07/19/2013 14:05   Ct Abdomen Pelvis W Contrast  07/19/2013   CLINICAL DATA:  Status post gastric bypass surgery 5 days ago with possible leak. Decreased respiratory response.  EXAM: CT ANGIOGRAPHY CHEST  CT ABDOMEN AND PELVIS WITH CONTRAST  TECHNIQUE: Multidetector CT imaging of the chest was performed using the standard protocol during bolus administration of intravenous contrast. Multiplanar CT image reconstructions and MIPs were obtained to evaluate the vascular anatomy. Multidetector CT imaging of the abdomen and pelvis was performed using the standard protocol during bolus administration of intravenous contrast.  CONTRAST:  249mL OMNIPAQUE IOHEXOL 350 MG/ML SOLN  COMPARISON:  None.  FINDINGS: CTA CHEST FINDINGS  Despite a second bolus of IV contrast and re-imaging, image quality is markedly degraded for the detection of segmental and subsegmental pulmonary emboli, due to body habitus. No central or lobar pulmonary embolus.   Mediastinal lymph nodes measure up to 9 mm in the AP window. No definite hilar or axillary adenopathy. Heart is enlarged. Right IJ central line terminates in the right atrium. No pericardial effusion.  Again, image quality is markedly degraded by body habitus and respiratory motion. There is multilobar patchy bilateral ground-glass with consolidation in both lower lobes. No pleural fluid.  CT ABDOMEN and PELVIS FINDINGS  Liver appears decreased in attenuation diffusely. Gallbladder, adrenal glands, kidneys, spleen and pancreas are grossly unremarkable. Image quality is degraded by body habitus and respiratory motion. Postoperative changes of gastric sleeve procedure. No small bowel dilatation. Colon is unremarkable. Uterus and ovaries are visualized. No pathologically enlarged lymph nodes. No free fluid. Small periumbilical hernia contains fat. No worrisome lytic or sclerotic lesions. There are severe degenerative changes in the thoracic and lumbar spine with flowing anterior osteophytosis.  Review of the MIP images confirms the above findings.  IMPRESSION: 1. Image quality is severely degraded by body habitus and motion. No central or lobar pulmonary embolus. 2. Multi lobar ground-glass and consolidation, indicative of edema or pneumonia. Developing adult respiratory distress syndrome (ARDS) is another consideration. 3. Liver appears fatty. 4. Postoperative changes of recent gastric sleeve procedure without acute finding in the abdomen or pelvis.   Electronically Signed   By: Lorin Picket M.D.   On: 07/19/2013 14:05   Dg Chest Port 1 View  07/20/2013   CLINICAL DATA:  Hypoxia  EXAM: PORTABLE CHEST - 1 VIEW  COMPARISON:  July 19, 2013 chest radiograph and chest CT  FINDINGS: The endotracheal tube tip is 3.7 cm above the carina. Nasogastric tube and side port are below the diaphragm. No pneumothorax. There is patchy alveolar edema bilaterally, not felt to be changed. No new opacity is seen. Heart is mildly  enlarged with pulmonary vascularity within normal limits. No adenopathy.  IMPRESSION: Tube positions as described without pneumothorax. Patchy edema remains without change. No new opacity seen.   Electronically Signed   By: Lowella Grip M.D.   On: 07/20/2013 07:36   Dg Chest Port 1 View  07/19/2013   CLINICAL DATA:  Respiratory failure.  EXAM: PORTABLE CHEST - 1 VIEW  COMPARISON:  Chest x-ray 07/18/2013.  FINDINGS: Endotracheal tube is been withdrawn. Is tip is approximately 2.1 cm above the carina. Right IJ line in stable position. Stable cardiomegaly. Stable diffuse bilateral pulmonary infiltrates. Differential diagnosis includes pulmonary edema and/or pneumonia. No pleural effusion or pneumothorax.  IMPRESSION: 1. Interval withdrawal of endotracheal tube from right mainstem bronchus. The tip is now 2.1 cm above the carina. Right IJ line in stable position. 2. Persistent severe bilateral pulmonary alveolar infiltrates, unchanged. Differential diagnosis includes pulmonary edema and/or pneumonia. 3. Persistent cardiomegaly.   Electronically Signed   By: [place]   On: 07/19/2013 07:34   Dg Abd Portable 1v  07/19/2013   CLINICAL DATA:  Post nasogastric tube placement.  EXAM: PORTABLE ABDOMEN - 1 VIEW  COMPARISON:  CT abdomen pelvis of same day.  FINDINGS: Nasogastric tube terminates in the gastric antrum. Mild gaseous distention of colon. Overall image quality is degraded by body habitus.  IMPRESSION: Satisfactory nasogastric tube placement.   Electronically Signed   By: Lorin Picket M.D.   On: 07/19/2013 19:19   2D Echo: Left ventricle: The cavity size was normal. Wall thickness was increased in a pattern of mild LVH. Systolic function was normal. The estimated ejection fraction was in the range of 50% to 55%. Regional wall motion abnormalities cannot be excluded. Doppler parameters are consistent with abnormal left ventricular relaxation (grade 1 diastolic  dysfunction).    Medications: I have reviewed the patient's current medications. Scheduled Meds: . albuterol  2.5 mg Nebulization Q6H  . antiseptic oral rinse  15 mL Mouth Rinse QID  . budesonide  0.25 mg Nebulization 4 times per day  . chlorhexidine  15 mL Mouth Rinse BID  . feeding supplement (VITAL HIGH PROTEIN)  1,000 mL Per Tube Q24H  . fentaNYL  50 mcg Intravenous Once  . furosemide  40 mg Intravenous Q6H  . heparin  5,000 Units Subcutaneous 3 times per day  . pantoprazole (PROTONIX) IV  40 mg Intravenous QHS  . piperacillin-tazobactam (ZOSYN)  IV  3.375 g Intravenous 3 times per day  . vancomycin  2,000 mg Intravenous Q12H   Continuous Infusions: . dexmedetomidine 0.6 mcg/kg/hr (07/20/13 1057)  . fentaNYL infusion INTRAVENOUS 20 mcg/hr (07/20/13 1000)  . propofol Stopped (07/20/13 0907)   PRN Meds:.sodium chloride, acetaminophen, albuterol, fentaNYL, midazolam' Assessment/Plan: Principal Problem:   Acute respiratory failure- intubated Active Problems:   Morbid obesity- recent bariatric surgery- no leak   ARDS (adult respiratory distress syndrome)-CCM following   Hypertension   CHF (congestive heart failure)- diastolic HF - on Lasix 40 every 6 hours  But + 2 L- may need higher dose or metolazone.    NSTEMI type 2 secondary to demand ischemia with sepsis     Severe  sepsis Febrile -100.4    Anemia   LOS: 2 days   Time spent with pt. :15 minutes. Cecilie Kicks  Nurse Practitioner Certified Pager [phone] or after 5pm and on weekends call [phone] 07/20/2013, 12:15 PM   Personally seen and examined. Agree with above. NSTEMI - type 2 in the setting of respiratory failure. EF normal.  Diastolic acute heart failure.  CCM continuing diuresis with IV lasix. Creat 0.72 WBC decreased.  OP report reviewed.  Will sign off. Please call with any questions. No further cardiac workup necessary. Discussed with her step mother.  Candee Furbish, MD

## 2013-07-20 NOTE — Op Note (Signed)
[name], ORF              ACCOUNT NO.:  [id number]  MEDICAL RECORD NO.:  [id number]  LOCATION:  3M11C                        FACILITY:  [facility]  PHYSICIAN:  Coralie Keens, M.D. DATE OF BIRTH:  02-10-1981  DATE OF PROCEDURE:  07/19/2013 DATE OF DISCHARGE:                              OPERATIVE REPORT   PREOPERATIVE DIAGNOSIS:  Sepsis, rule out leak from gastric sleeve.  POSTOPERATIVE DIAGNOSIS:  Sepsis, rule out leak from gastric sleeve with no evidence of leak.  PROCEDURE: 1. Diagnostic laparoscopy 2. Esophagogastroduodenoscopy.  SURGEON:  Coralie Keens, MD  CO-SURGEONFenton Malling. Lucia Gaskins, MD  ANESTHESIA:  General endotracheal anesthesia and 0.5% Marcaine.  ESTIMATED BLOOD LOSS:  Minimal.  INDICATIONS:  This is a morbidly obese 33 year old female who is approximately 5 days status post a sleeve gastrectomy in [place].  She presented with acute respiratory failure requiring emergent intubation.  She has developed ARDS.  Decision was then made to proceed to the operating room for a diagnostic laparoscopy to rule out a leak from her gastric sleeve.  Preoperative CAT scan was unremarkable.  FINDINGS:  The patient was found to have an intact gastric sleeve without evidence of leak.  She had undergone EGD as well, which showed no evidence of leak and a nasogastric tube was able to be successfully placed into the distal stomach.  PROCEDURE IN DETAIL:  The patient was brought to the operating room, identified Melissa Erickson.  She was placed supine on the operating room table and general anesthesia was induced.  She was already intubated and brought from the intensive care unit.  Her abdomen was then prepped and draped in usual sterile fashion.  All her previous staples were removed. The previous 15 mm trocar site was opened up.  Using an Optiview entrance was gained into the abdominal cavity.  Insufflation of the abdomen was begun.  Upon entering the  abdomen, there was no gross contamination visualized.  A 5-mm trocar was placed through one of the previous lateral port sites and then a 10-mm trocar was placed through another of those central port sites both under direct vision.  A small incision was made in the patient's epigastrium and a liver retractor was passed under direct vision through this and used to elevate the left lobe of the liver.  The omentum was then slowly teased away from the gastric sleeve.  The staple line could be identified.  There was no gross evidence of leak with no fibrinous exudate or any other signs of intra-abdominal contamination.  At this point, Dr. Lucia Gaskins performed an upper endoscopy and will dictate this part of the procedure again with saline in the upper abdomen and under direct visualization no evidence of leak was identified.  Once the nasogastric tube was passed through the distal stomach, decision was made to terminate the procedure.  All fluid was irrigated out of the abdomen.  The liver retractor was removed under direct vision.  All trocars were removed under direct vision.  The abdomen was deflated.  All incisions were anesthetized with Marcaine and closed with skin staples.  The patient appeared to tolerate the procedure well.  She remained intubated postoperatively.  All sponge, needle,  and instrument counts were correct at the end of the procedure. She was then taken in a guarded condition from the operating room back to the intensive care unit.     Coralie Keens, M.D.     DB/MEDQ  D:  07/19/2013  T:  07/20/2013  Job:  [id number]

## 2013-07-20 NOTE — Progress Notes (Signed)
NUTRITION FOLLOW-UP  DOCUMENTATION CODES Per approved criteria  -Morbid Obesity   INTERVENTION: Initiate Vital High Protein @ 20 ml/hr via NG tube and increase by 10 ml every 4 hours to goal rate of 70 ml/hr.   Tube feeding regimen provides 1680 kcal (60% of needs), 147 grams of protein, and 1404 ml of H2O.    NUTRITION DIAGNOSIS: Inadequate oral intake related to inability to eat as evidenced by NPO status; ongoing.   Goal: Enteral nutrition to provide 60-70% of estimated calorie needs (22-25 kcals/kg ideal body weight) and 100% of estimated protein needs, based on ASPEN guidelines for permissive underfeeding in critically ill obese individuals, not met.   Monitor:  Respiratory status, weight trend, labs, TF initiation/tolerance   ASSESSMENT: 33 years old morbid obese patient with PMH relevant for HTN, OSA, asthma. Underwent laparoscopic vertical sleeve gastrectomy on 07/14/13 ([place]). Pt required intubation Chest X ray with bilateral diffuse patchy infiltrates compatible with ARDS vs cardiogenic pulmonary edema.   Patient is currently intubated on ventilator support MV: 10 L/min Temp (24hrs), Avg:99.8 F (37.7 C), Min:98.2 F (36.8 C), Max:100.8 F (38.2 C)  Propofol: now off  Pt received TPN 6/2 pm, discontinued and NG placed in OR after EGD completed and no leak found. Tip of tube in gastric antrum. MAP > 70. Pt on lasix. Pt discussed during ICU rounds and with RN.   Height: Ht Readings from Last 1 Encounters:  07/18/13 5' 4.17" (1.63 m)    Weight: Wt Readings from Last 1 Encounters:  07/18/13 401 lb 14.4 oz (182.3 kg)    Ideal Body Weight: 54.5 kg   BMI:  Body mass index is 68.61 kg/(m^2).  Estimated Nutritional Needs: Kcal: 2759 Goal Kcal: 1650-1850 (60-67% of needs) will need to subtract total kcal provided from propofol   Protein: >/= 136 grams Fluid: 1.5 L/day  Skin: abdominal incision  Diet Order: NPO   Intake/Output Summary (Last 24 hours) at  07/20/13 0923 Last data filed at 07/20/13 0907  Gross per 24 hour  Intake 3700.76 ml  Output   1393 ml  Net 2307.76 ml    Last BM: PTA   Labs:   Recent Labs Lab 07/18/13 0233 07/18/13 0255 07/19/13 0350 07/20/13 0500  NA 140 143 144 142  K 4.7 4.4 3.8 4.3  CL 99 103 103 103  CO2 18*  --  32 31  BUN 25* 25* 15 11  CREATININE 0.98 1.10 0.81 0.72  CALCIUM 9.2  --  8.1* 7.8*  MG  --   --   --  2.4  PHOS  --   --   --  2.3  GLUCOSE 142* 141* 137* 124*    CBG (last 3)   Recent Labs  07/19/13 2000 07/19/13 2343 07/20/13 0351  GLUCAP 114* 100* 104*   Lab Results  Component Value Date   TRIG 274* 07/19/2013    Scheduled Meds: . albuterol  2.5 mg Nebulization Q6H  . antiseptic oral rinse  15 mL Mouth Rinse QID  . budesonide  0.25 mg Nebulization 4 times per day  . chlorhexidine  15 mL Mouth Rinse BID  . fentaNYL  50 mcg Intravenous Once  . furosemide  40 mg Intravenous Q6H  . heparin  5,000 Units Subcutaneous 3 times per day  . pantoprazole (PROTONIX) IV  40 mg Intravenous QHS  . piperacillin-tazobactam (ZOSYN)  IV  3.375 g Intravenous 3 times per day  . vancomycin  1,750 mg Intravenous Q12H    Continuous  Infusions: . dexmedetomidine 0.4 mcg/kg/hr (07/20/13 0907)  . fentaNYL infusion INTRAVENOUS 200 mcg/hr (07/20/13 0900)  . propofol Stopped (07/20/13 0907)    [place], [place], CNSC [phone] Pager [phone] After Hours Pager

## 2013-07-20 NOTE — Anesthesia Postprocedure Evaluation (Signed)
  Anesthesia Post-op Note  Patient: Melissa Erickson  Procedure(s) Performed: Procedure(s): LAPAROSCOPY DIAGNOSTIC (N/A) ESOPHAGOGASTRODUODENOSCOPY (EGD) (N/A)  Patient Location: ICU  Anesthesia Type:General  Level of Consciousness: sedated  Airway and Oxygen Therapy: Patient remains intubated per anesthesia plan  Post-op Pain: none  Post-op Assessment: Post-op Vital signs reviewed, Patient's Cardiovascular Status Stable and Respiratory Function Stable  Post-op Vital Signs: Reviewed and stable  Last Vitals:  Filed Vitals:   07/20/13 0800  BP: 126/64  Pulse: 103  Temp: 38.1 C  Resp: 24    Complications: No apparent anesthesia complications

## 2013-07-20 NOTE — Progress Notes (Addendum)
140 mL Nimbex wasted in sink. Efraim Kaufmann, RN witness.  Witnessed, Efraim Kaufmann, RN

## 2013-07-20 NOTE — Progress Notes (Signed)
ANTIBIOTIC CONSULT NOTE - FOLLOW UP  Pharmacy Consult for Vancomycin and Zosyn Indication: pneumonia/ ARDS  No Known Allergies  Patient Measurements: Height: 5' 4.17" (163 cm) Weight:  (bed error) IBW/kg (Calculated) : 55.1  Vital Signs: Temp: 100.8 F (38.2 C) (06/03 1121) Temp src: Axillary (06/03 1121) BP: 107/60 mmHg (06/03 1200) Pulse Rate: 98 (06/03 1200) Intake/Output from previous day: 06/02 0701 - 06/03 0700 In: 3807.5 [I.V.:2627.5; NG/GT:30; IV Piggyback:1150] Out: 9323 [Urine:1223; Emesis/NG output:90; Blood:30] Intake/Output from this shift: Total I/O In: 343.4 [I.V.:343.4] Out: 2550 [Urine:2550]  Labs:  Recent Labs  07/18/13 0233 07/18/13 0255 07/19/13 0350 07/20/13 0500  WBC 18.7*  --  10.6* 9.6  HGB 14.1 17.0* 11.7* 10.6*  PLT 281  --  189 151  CREATININE 0.98 1.10 0.81 0.72   Estimated Creatinine Clearance: 165.8 ml/min (by C-G formula based on Cr of 0.72).  Recent Labs  07/20/13 0500  VANCOTROUGH 10.3    Assessment:   Day # 3 Vancomycin and Zosyn.  Urine culture negative; blood cultures negative to date.  Tmax 100.8, WBC down to 9.6.   BUN/creatinine have trended down.  Increased output today - diuresing with Lasix.   Vancomycin level prior to 6am dose = 10.3 mcg/ml. Below target of 15-20 mcg/ml.  Goal of Therapy:  Vancomycin trough level 15-20 mcg/ml appropriate Zosyn dose for renal function and infection  Plan:   Vancomycin increased from 1750 mg to 2 grams IV q12hrs.  Continue Zoysn 3.375 grams IV q8hrs (each over 4 hours).  Will follow renal function, culture data and clinical progress.  Will consider repeating vancomycin trough level in 3-4 days if confirmation is indicated.  Arty Baumgartner, [place] Pager: [phone] 07/20/2013,12:55 PM

## 2013-07-20 NOTE — Progress Notes (Signed)
Verbal Consent given from [place] Ponder, mother to allow Cloyde Reams (Stepmother) and Irven Shelling [facility]) to make decisions for her daughter Melissa Erickson.  Rijon Delarosa (brother), Cloyde Reams (Stepmother), Derrell Lolling, RN, and Mathews Robinsons, RN present.

## 2013-07-21 ENCOUNTER — Inpatient Hospital Stay (HOSPITAL_COMMUNITY): Payer: BC Managed Care – PPO

## 2013-07-21 LAB — GLUCOSE, CAPILLARY
GLUCOSE-CAPILLARY: 136 mg/dL — AB (ref 70–99)
Glucose-Capillary: 129 mg/dL — ABNORMAL HIGH (ref 70–99)
Glucose-Capillary: 129 mg/dL — ABNORMAL HIGH (ref 70–99)
Glucose-Capillary: 134 mg/dL — ABNORMAL HIGH (ref 70–99)
Glucose-Capillary: 139 mg/dL — ABNORMAL HIGH (ref 70–99)
Glucose-Capillary: 148 mg/dL — ABNORMAL HIGH (ref 70–99)
Glucose-Capillary: 167 mg/dL — ABNORMAL HIGH (ref 70–99)

## 2013-07-21 LAB — POCT I-STAT 3, ART BLOOD GAS (G3+)
Acid-Base Excess: 11 mmol/L — ABNORMAL HIGH (ref 0.0–2.0)
Bicarbonate: 35.1 meq/L — ABNORMAL HIGH (ref 20.0–24.0)
O2 Saturation: 91 %
Patient temperature: 39
TCO2: 36 mmol/L (ref 0–100)
pCO2 arterial: 48.3 mmHg — ABNORMAL HIGH (ref 35.0–45.0)
pH, Arterial: 7.476 — ABNORMAL HIGH (ref 7.350–7.450)
pO2, Arterial: 64 mmHg — ABNORMAL LOW (ref 80.0–100.0)

## 2013-07-21 LAB — BLOOD GAS, ARTERIAL
Acid-Base Excess: 10.4 mmol/L — ABNORMAL HIGH (ref 0.0–2.0)
Bicarbonate: 34.8 meq/L — ABNORMAL HIGH (ref 20.0–24.0)
Drawn by: 28701
FIO2: 0.5 %
O2 Saturation: 92.3 %
PEEP: 8 cmH2O
Patient temperature: 101.6
Pressure control: 14 cmH2O
RATE: 18 {breaths}/min
TCO2: 36.3 mmol/L (ref 0–100)
pCO2 arterial: 54.3 mmHg — ABNORMAL HIGH (ref 35.0–45.0)
pH, Arterial: 7.431 (ref 7.350–7.450)
pO2, Arterial: 68.1 mmHg — ABNORMAL LOW (ref 80.0–100.0)

## 2013-07-21 LAB — BASIC METABOLIC PANEL
BUN: 15 mg/dL (ref 6–23)
CHLORIDE: 104 meq/L (ref 96–112)
CO2: 33 mEq/L — ABNORMAL HIGH (ref 19–32)
Calcium: 7.8 mg/dL — ABNORMAL LOW (ref 8.4–10.5)
Creatinine, Ser: 0.89 mg/dL (ref 0.50–1.10)
GFR calc non Af Amer: 84 mL/min — ABNORMAL LOW (ref >90)
Glucose, Bld: 148 mg/dL — ABNORMAL HIGH (ref 70–99)
POTASSIUM: 3.4 meq/L — AB (ref 3.7–5.3)
Sodium: 144 mEq/L (ref 137–147)

## 2013-07-21 LAB — CBC
HCT: 33.7 % — ABNORMAL LOW (ref 36.0–46.0)
Hemoglobin: 10.6 g/dL — ABNORMAL LOW (ref 12.0–15.0)
MCH: 28.1 pg (ref 26.0–34.0)
MCHC: 31.5 g/dL (ref 30.0–36.0)
MCV: 89.4 fL (ref 78.0–100.0)
Platelets: 129 10*3/uL — ABNORMAL LOW (ref 150–400)
RBC: 3.77 MIL/uL — ABNORMAL LOW (ref 3.87–5.11)
RDW: 14.4 % (ref 11.5–15.5)
WBC: 9.9 10*3/uL (ref 4.0–10.5)

## 2013-07-21 LAB — MAGNESIUM: Magnesium: 2.1 mg/dL (ref 1.5–2.5)

## 2013-07-21 LAB — PHOSPHORUS: Phosphorus: 1.4 mg/dL — ABNORMAL LOW (ref 2.3–4.6)

## 2013-07-21 MED ORDER — POTASSIUM PHOSPHATES 15 MMOLE/5ML IV SOLN
30.0000 mmol | Freq: Once | INTRAVENOUS | Status: AC
  Administered 2013-07-21: 30 mmol via INTRAVENOUS
  Filled 2013-07-21: qty 10

## 2013-07-21 MED ORDER — FUROSEMIDE 10 MG/ML IJ SOLN
40.0000 mg | Freq: Four times a day (QID) | INTRAMUSCULAR | Status: AC
  Administered 2013-07-21 (×3): 40 mg via INTRAVENOUS
  Filled 2013-07-21 (×3): qty 4

## 2013-07-21 MED ORDER — METOLAZONE 5 MG PO TABS
5.0000 mg | ORAL_TABLET | Freq: Every day | ORAL | Status: AC
  Administered 2013-07-21: 5 mg via ORAL
  Filled 2013-07-21: qty 1

## 2013-07-21 MED ORDER — ACETAZOLAMIDE SODIUM 500 MG IJ SOLR
250.0000 mg | Freq: Three times a day (TID) | INTRAMUSCULAR | Status: AC
  Administered 2013-07-21 (×2): 250 mg via INTRAVENOUS
  Filled 2013-07-21 (×2): qty 500

## 2013-07-21 MED ORDER — POTASSIUM CHLORIDE 20 MEQ/15ML (10%) PO LIQD
40.0000 meq | Freq: Three times a day (TID) | ORAL | Status: AC
  Administered 2013-07-21 (×2): 40 meq
  Filled 2013-07-21 (×2): qty 30

## 2013-07-21 NOTE — Progress Notes (Signed)
2 Days Post-Op  Subjective: Intubated and sedated Tolerating tube feeds  Objective: Vital signs in last 24 hours: Temp:  [100.8 F (38.2 C)-102.9 F (39.4 C)] 102 F (38.9 C) (06/04 0500) Pulse Rate:  [79-106] 89 (06/04 0700) Resp:  [0-36] 26 (06/04 0700) BP: (107-133)/(51-75) 111/66 mmHg (06/04 0700) SpO2:  [93 %-100 %] 96 % (06/04 0754) Arterial Line BP: (93-125)/(48-70) 112/49 mmHg (06/04 0700) FiO2 (%):  [40 %-60 %] 40 % (06/04 0821) Weight:  [403 lb (182.8 kg)] 403 lb (182.8 kg) (06/04 0406) Last BM Date:  (PTA)  Intake/Output from previous day: 06/03 0701 - 06/04 0700 In: 3385.2 [I.V.:1555.2; NG/GT:680; IV Piggyback:1150] Out: 5948 [Urine:5948] Intake/Output this shift:    Abdomen soft, incisions clean  Lab Results:   Recent Labs  07/20/13 0500 07/21/13 0330  WBC 9.6 9.9  HGB 10.6* 10.6*  HCT 34.9* 33.7*  PLT 151 129*   BMET  Recent Labs  07/20/13 0500 07/21/13 0330  NA 142 144  K 4.3 3.4*  CL 103 104  CO2 31 33*  GLUCOSE 124* 148*  BUN 11 15  CREATININE 0.72 0.89  CALCIUM 7.8* 7.8*   PT/INR  Recent Labs  07/19/13 1022  LABPROT 17.3*  INR 1.45   ABG  Recent Labs  07/21/13 0236 07/21/13 0450  PHART 7.476* 7.431  HCO3 35.1* 34.8*    Studies/Results: Ct Angio Chest Pe W/cm &/or Wo Cm  07/19/2013   CLINICAL DATA:  Status post gastric bypass surgery 5 days ago with possible leak. Decreased respiratory response.  EXAM: CT ANGIOGRAPHY CHEST  CT ABDOMEN AND PELVIS WITH CONTRAST  TECHNIQUE: Multidetector CT imaging of the chest was performed using the standard protocol during bolus administration of intravenous contrast. Multiplanar CT image reconstructions and MIPs were obtained to evaluate the vascular anatomy. Multidetector CT imaging of the abdomen and pelvis was performed using the standard protocol during bolus administration of intravenous contrast.  CONTRAST:  230mL OMNIPAQUE IOHEXOL 350 MG/ML SOLN  COMPARISON:  None.  FINDINGS: CTA CHEST  FINDINGS  Despite a second bolus of IV contrast and re-imaging, image quality is markedly degraded for the detection of segmental and subsegmental pulmonary emboli, due to body habitus. No central or lobar pulmonary embolus.  Mediastinal lymph nodes measure up to 9 mm in the AP window. No definite hilar or axillary adenopathy. Heart is enlarged. Right IJ central line terminates in the right atrium. No pericardial effusion.  Again, image quality is markedly degraded by body habitus and respiratory motion. There is multilobar patchy bilateral ground-glass with consolidation in both lower lobes. No pleural fluid.  CT ABDOMEN and PELVIS FINDINGS  Liver appears decreased in attenuation diffusely. Gallbladder, adrenal glands, kidneys, spleen and pancreas are grossly unremarkable. Image quality is degraded by body habitus and respiratory motion. Postoperative changes of gastric sleeve procedure. No small bowel dilatation. Colon is unremarkable. Uterus and ovaries are visualized. No pathologically enlarged lymph nodes. No free fluid. Small periumbilical hernia contains fat. No worrisome lytic or sclerotic lesions. There are severe degenerative changes in the thoracic and lumbar spine with flowing anterior osteophytosis.  Review of the MIP images confirms the above findings.  IMPRESSION: 1. Image quality is severely degraded by body habitus and motion. No central or lobar pulmonary embolus. 2. Multi lobar ground-glass and consolidation, indicative of edema or pneumonia. Developing adult respiratory distress syndrome (ARDS) is another consideration. 3. Liver appears fatty. 4. Postoperative changes of recent gastric sleeve procedure without acute finding in the abdomen or pelvis.  Electronically Signed   By: Lorin Picket M.D.   On: 07/19/2013 14:05   Ct Abdomen Pelvis W Contrast  07/19/2013   CLINICAL DATA:  Status post gastric bypass surgery 5 days ago with possible leak. Decreased respiratory response.  EXAM: CT  ANGIOGRAPHY CHEST  CT ABDOMEN AND PELVIS WITH CONTRAST  TECHNIQUE: Multidetector CT imaging of the chest was performed using the standard protocol during bolus administration of intravenous contrast. Multiplanar CT image reconstructions and MIPs were obtained to evaluate the vascular anatomy. Multidetector CT imaging of the abdomen and pelvis was performed using the standard protocol during bolus administration of intravenous contrast.  CONTRAST:  263mL OMNIPAQUE IOHEXOL 350 MG/ML SOLN  COMPARISON:  None.  FINDINGS: CTA CHEST FINDINGS  Despite a second bolus of IV contrast and re-imaging, image quality is markedly degraded for the detection of segmental and subsegmental pulmonary emboli, due to body habitus. No central or lobar pulmonary embolus.  Mediastinal lymph nodes measure up to 9 mm in the AP window. No definite hilar or axillary adenopathy. Heart is enlarged. Right IJ central line terminates in the right atrium. No pericardial effusion.  Again, image quality is markedly degraded by body habitus and respiratory motion. There is multilobar patchy bilateral ground-glass with consolidation in both lower lobes. No pleural fluid.  CT ABDOMEN and PELVIS FINDINGS  Liver appears decreased in attenuation diffusely. Gallbladder, adrenal glands, kidneys, spleen and pancreas are grossly unremarkable. Image quality is degraded by body habitus and respiratory motion. Postoperative changes of gastric sleeve procedure. No small bowel dilatation. Colon is unremarkable. Uterus and ovaries are visualized. No pathologically enlarged lymph nodes. No free fluid. Small periumbilical hernia contains fat. No worrisome lytic or sclerotic lesions. There are severe degenerative changes in the thoracic and lumbar spine with flowing anterior osteophytosis.  Review of the MIP images confirms the above findings.  IMPRESSION: 1. Image quality is severely degraded by body habitus and motion. No central or lobar pulmonary embolus. 2. Multi  lobar ground-glass and consolidation, indicative of edema or pneumonia. Developing adult respiratory distress syndrome (ARDS) is another consideration. 3. Liver appears fatty. 4. Postoperative changes of recent gastric sleeve procedure without acute finding in the abdomen or pelvis.   Electronically Signed   By: Lorin Picket M.D.   On: 07/19/2013 14:05   Dg Chest Port 1 View  07/21/2013   CLINICAL DATA:  Check endotracheal tube placement  EXAM: PORTABLE CHEST - 1 VIEW  COMPARISON:  07/20/2013  FINDINGS: The cardiac shadow remains enlarged. The overall inspiratory effort is poor. A right jugular central line is seen as well as a nasogastric catheter. These are stable in appearance. An endotracheal tube is seen and appears advanced lying at the level of the carina. This should be withdrawn 2-3 cm.  IMPRESSION: Endotracheal tip at the level of the carina. This should be withdrawn at least 2 cm.  Poor inspiratory effort with crowding of vascular markings.  These results were called by telephone at the time of interpretation on 07/21/2013 at 7:51 AM to Marya Amsler, the patient's nurse who verbally acknowledged these results.   Electronically Signed   By: Inez Catalina M.D.   On: 07/21/2013 07:51   Dg Chest Port 1 View  07/20/2013   CLINICAL DATA:  Hypoxia  EXAM: PORTABLE CHEST - 1 VIEW  COMPARISON:  July 19, 2013 chest radiograph and chest CT  FINDINGS: The endotracheal tube tip is 3.7 cm above the carina. Nasogastric tube and side port are below the diaphragm. No pneumothorax.  There is patchy alveolar edema bilaterally, not felt to be changed. No new opacity is seen. Heart is mildly enlarged with pulmonary vascularity within normal limits. No adenopathy.  IMPRESSION: Tube positions as described without pneumothorax. Patchy edema remains without change. No new opacity seen.   Electronically Signed   By: Lowella Grip M.D.   On: 07/20/2013 07:36   Dg Abd Portable 1v  07/19/2013   CLINICAL DATA:  Post nasogastric tube  placement.  EXAM: PORTABLE ABDOMEN - 1 VIEW  COMPARISON:  CT abdomen pelvis of same day.  FINDINGS: Nasogastric tube terminates in the gastric antrum. Mild gaseous distention of colon. Overall image quality is degraded by body habitus.  IMPRESSION: Satisfactory nasogastric tube placement.   Electronically Signed   By: Lorin Picket M.D.   On: 07/19/2013 19:19    Anti-infectives: Anti-infectives   Start     Dose/Rate Route Frequency Ordered Stop   07/20/13 1800  vancomycin (VANCOCIN) 2,000 mg in sodium chloride 0.9 % 500 mL IVPB     2,000 mg 250 mL/hr over 120 Minutes Intravenous Every 12 hours 07/20/13 0929     07/18/13 1800  vancomycin (VANCOCIN) 1,750 mg in sodium chloride 0.9 % 500 mL IVPB  Status:  Discontinued     1,750 mg 250 mL/hr over 120 Minutes Intravenous Every 12 hours 07/18/13 0514 07/20/13 0929   07/18/13 1400  piperacillin-tazobactam (ZOSYN) IVPB 3.375 g  Status:  Discontinued     3.375 g 12.5 mL/hr over 240 Minutes Intravenous 3 times per day 07/18/13 0514 07/18/13 0710   07/18/13 0800  piperacillin-tazobactam (ZOSYN) IVPB 3.375 g     3.375 g 12.5 mL/hr over 240 Minutes Intravenous 3 times per day 07/18/13 0710     07/18/13 0515  vancomycin (VANCOCIN) 2,500 mg in sodium chloride 0.9 % 500 mL IVPB     2,500 mg 250 mL/hr over 120 Minutes Intravenous  Once 07/18/13 0508 07/18/13 0948   07/18/13 0315  vancomycin (VANCOCIN) IVPB 1000 mg/200 mL premix  Status:  Discontinued     1,000 mg 200 mL/hr over 60 Minutes Intravenous  Once 07/18/13 0312 07/18/13 0508   07/18/13 0315  piperacillin-tazobactam (ZOSYN) IVPB 3.375 g  Status:  Discontinued     3.375 g 100 mL/hr over 30 Minutes Intravenous  Once 07/18/13 0312 07/18/13 0710      Assessment/Plan: s/p Procedure(s): LAPAROSCOPY DIAGNOSTIC (N/A) ESOPHAGOGASTRODUODENOSCOPY (EGD) (N/A)  Negative diagnostic laparoscopy.  Tolerating tube feeds  Nothing further to offer from surgical standpoint.  Will sign off.  Please call  back if needed.  LOS: 3 days    Harl Bowie 07/21/2013

## 2013-07-21 NOTE — Progress Notes (Signed)
UR completed.  Amyre Segundo, RN BSN [facility] CCM Trauma/Neuro ICU Case Manager [phone]  

## 2013-07-21 NOTE — Progress Notes (Signed)
PULMONARY / CRITICAL CARE MEDICINE   Name: Melissa Erickson MRN: [id number] DOB: 1980-03-14    ADMISSION DATE:  07/18/2013   BRIEF PATIENT DESCRIPTION:  71 F with hx of morbid obesity, htn, OSA, asthma underwent laparoscopic vertical sleeve gastrectomy 5/28 in [place], [place]. Was discharged home the following day. Adm to PCCM service early AM 6/01 after presenting with acute hypoxic failure requiring intubation shortly after arrival.  SIGNIFICANT EVENTS / STUDIES:  6/01 Echocardiogram: LVEF 50-55%. Mild LVH. Grade 1 diastolic dysfxn 7/90 LE venous Dopplers: no evidence of DVT  LINES / TUBES: ETT 6/01 >>  L radial A line 6/01 >>  R IJ CVL 6/01 >>  NGT 6/2 by surgery in the OR, if out then IR can replace>>>  CULTURES: Urine 6/01 >>  Resp 6/01 >>  Blood 6/01 >>   ANTIBIOTICS: Vanc 6/01 >>  Zosyn 6/01 >>   SUBJECTIVE:  Heavily sedated this AM had to be paralyzed overnight due to asynchrony  VITAL SIGNS: Temp:  [100.8 F (38.2 C)-102.9 F (39.4 C)] 101.9 F (38.8 C) (06/04 0821) Pulse Rate:  [79-106] 89 (06/04 0700) Resp:  [0-36] 26 (06/04 0700) BP: (107-133)/(51-75) 111/66 mmHg (06/04 0700) SpO2:  [93 %-100 %] 95 % (06/04 0821) Arterial Line BP: (93-125)/(48-70) 112/49 mmHg (06/04 0700) FiO2 (%):  [40 %-60 %] 40 % (06/04 0821) Weight:  [403 lb (182.8 kg)] 403 lb (182.8 kg) (06/04 0406)  HEMODYNAMICS:   VENTILATOR SETTINGS: Vent Mode:  [-] PCV FiO2 (%):  [40 %-60 %] 40 % Set Rate:  [18 bmp] 18 bmp PEEP:  [8 cmH20-12 cmH20] 8 cmH20 Plateau Pressure:  [26 cmH20-29 cmH20] 27 cmH20 INTAKE / OUTPUT: Intake/Output     06/03 0701 - 06/04 0700 06/04 0701 - 06/05 0700   I.V. (mL/kg) 1555.2 (8.5)    NG/GT 680    IV Piggyback 1150    Total Intake(mL/kg) 3385.2 (18.5)    Urine (mL/kg/hr) 5948 (1.4)    Emesis/NG output     Blood     Total Output 5948     Net -2562.8           PHYSICAL EXAMINATION: General:  Morbidly obese, intubated, sedated Neuro: PERRL, rest of  exam limited HEENT: NCAT Cardiovascular: distant HS, regular, no M Lungs: no wheezes, distant BS Abdomen: Soft, 3 small surgical wounds with staples present, no purulence, BS diminished Ext: warm, no edema  LABS:  CBC  Recent Labs Lab 07/19/13 0350 07/20/13 0500 07/21/13 0330  WBC 10.6* 9.6 9.9  HGB 11.7* 10.6* 10.6*  HCT 38.6 34.9* 33.7*  PLT 189 151 129*   Coag's  Recent Labs Lab 07/18/13 0233 07/19/13 1022  INR 1.51* 1.45   BMET  Recent Labs Lab 07/19/13 0350 07/20/13 0500 07/21/13 0330  NA 144 142 144  K 3.8 4.3 3.4*  CL 103 103 104  CO2 32 31 33*  BUN 15 11 15   CREATININE 0.81 0.72 0.89  GLUCOSE 137* 124* 148*   Electrolytes  Recent Labs Lab 07/19/13 0350 07/20/13 0500 07/21/13 0330  CALCIUM 8.1* 7.8* 7.8*  MG  --  2.4 2.1  PHOS  --  2.3 1.4*   Sepsis Markers  Recent Labs Lab 07/18/13 0254  LATICACIDVEN 7.29*   ABG  Recent Labs Lab 07/20/13 2129 07/21/13 0236 07/21/13 0450  PHART 7.484* 7.476* 7.431  PCO2ART 50.0* 48.3* 54.3*  PO2ART 79.0* 64.0* 68.1*   Liver Enzymes  Recent Labs Lab 07/18/13 0233 07/19/13 0350 07/20/13 0500  AST 348* 165*  60*  ALT 418* 483* 295*  ALKPHOS 70 55 53  BILITOT 1.1 0.6 0.5  ALBUMIN 3.3* 2.8* 2.6*   Cardiac Enzymes  Recent Labs Lab 07/18/13 0233 07/19/13 0855  TROPONINI  --  0.98*  PROBNP 3595.0*  --    Glucose  Recent Labs Lab 07/20/13 0831 07/20/13 1119 07/20/13 1542 07/20/13 1942 07/20/13 2338 07/21/13 0309  GLUCAP 116* 130* 125* 121* 129* 134*   CXR: severe ARDS pattern  ASSESSMENT / PLAN:  PULMONARY A: Acute resp failure ARDS Very asynchronous with vent P:   - Cont full vent support - PCV due to asynchrony, much improved with PCV, adjust vent for ABG. - SBT in AM. - Cont vent bundle - F/U ABG and CXR in AM.  CARDIOVASCULAR A:  Sinus tachycardia - reactive Troponin mildly positive. P:  - Trop elevated, cardiology feels is demand ischemia and would  continue current measurement. - No anti-coag as not ACS. - Diastolic heart failure, diurese.  RENAL A:   AKI, nonoliguric, improved. Low K and Phos. P:   - Monitor BMET intermittently. - Monitor I/Os. - Correct electrolytes as indicated. - Lasix 40 mg IV q6 x3 doses. - Diamox 250 q8x2 and zaroxolyn 5 mg x1.  GASTROINTESTINAL A:   Severe obesity S/P bariatric surgery 5/28 To OR on 6/2, no leak from sleeve. P:   - SUP: IV PPI. - Continue TF per surgery. - NGT placed by surgery in the OR, if patient is to pull out then IR can replace does not need to be in the OR.  HEMATOLOGIC A:   No issues P:  - DVT px: SQ heparin. - Monitor CBC intermittently. - Transfuse per usual ICU guidelines.  INFECTIOUS A:  Severe sepsis/SIRS P:   - Micro and abx as above.  ENDOCRINE A:   Mild stress induced hyperglycemia without prior hx of DM P:   - Monitor glu on chem panels. - SSI for glu > 180 specially now that TF are starting.  NEUROLOGIC A:   ICU associated discomfort Ventilator dyssynchrony P:   - RASS goal: -3 to -4 - PAD protocol - Avoid further NMB. - Continue PRN versed, precedex drip and fentanyl drip.  TODAY'S SUMMARY:  Continue active diureses today, hold WAU for today and will SBT in AM with the hope to extubate after dropping PEEP to 5 today.  I have personally obtained a history, examined the patient, evaluated laboratory and imaging results, formulated the assessment and plan and placed orders.  CRITICAL CARE: The patient is critically ill with multiple organ systems failure and requires high complexity decision making for assessment and support, frequent evaluation and titration of therapies, application of advanced monitoring technologies and extensive interpretation of multiple databases. Critical Care Time devoted to patient care services described in this note is 35 minutes.   Rush Farmer, M.D. [facility] Pulmonary/Critical Care Medicine. Pager:  [phone]. After hours pager: [phone].

## 2013-07-22 ENCOUNTER — Inpatient Hospital Stay (HOSPITAL_COMMUNITY): Payer: BC Managed Care – PPO

## 2013-07-22 ENCOUNTER — Encounter (HOSPITAL_COMMUNITY): Payer: Self-pay | Admitting: Surgery

## 2013-07-22 LAB — BLOOD GAS, ARTERIAL
Acid-Base Excess: 6.1 mmol/L — ABNORMAL HIGH (ref 0.0–2.0)
Bicarbonate: 30.4 mEq/L — ABNORMAL HIGH (ref 20.0–24.0)
DRAWN BY: 28701
FIO2: 0.4 %
O2 SAT: 94.1 %
PATIENT TEMPERATURE: 99.8
PEEP: 5 cmH2O
PO2 ART: 73.3 mmHg — AB (ref 80.0–100.0)
Pressure control: 14 cmH2O
RATE: 18 resp/min
TCO2: 31.8 mmol/L (ref 0–100)
pCO2 arterial: 48.1 mmHg — ABNORMAL HIGH (ref 35.0–45.0)
pH, Arterial: 7.42 (ref 7.350–7.450)

## 2013-07-22 LAB — CBC
HCT: 39.5 % (ref 36.0–46.0)
HEMOGLOBIN: 12.4 g/dL (ref 12.0–15.0)
MCH: 27.6 pg (ref 26.0–34.0)
MCHC: 31.4 g/dL (ref 30.0–36.0)
MCV: 87.8 fL (ref 78.0–100.0)
Platelets: 123 10*3/uL — ABNORMAL LOW (ref 150–400)
RBC: 4.5 MIL/uL (ref 3.87–5.11)
RDW: 14.4 % (ref 11.5–15.5)
WBC: 10.8 10*3/uL — ABNORMAL HIGH (ref 4.0–10.5)

## 2013-07-22 LAB — GLUCOSE, CAPILLARY
GLUCOSE-CAPILLARY: 154 mg/dL — AB (ref 70–99)
GLUCOSE-CAPILLARY: 120 mg/dL — AB (ref 70–99)
GLUCOSE-CAPILLARY: 107 mg/dL — AB (ref 70–99)
Glucose-Capillary: 140 mg/dL — ABNORMAL HIGH (ref 70–99)
Glucose-Capillary: 123 mg/dL — ABNORMAL HIGH (ref 70–99)

## 2013-07-22 LAB — BASIC METABOLIC PANEL
BUN: 23 mg/dL (ref 6–23)
CO2: 29 meq/L (ref 19–32)
Calcium: 9.1 mg/dL (ref 8.4–10.5)
Chloride: 95 mEq/L — ABNORMAL LOW (ref 96–112)
Creatinine, Ser: 0.92 mg/dL (ref 0.50–1.10)
GFR calc Af Amer: >90 mL/min (ref >90)
GFR calc non Af Amer: 80 mL/min — ABNORMAL LOW (ref >90)
GLUCOSE: 151 mg/dL — AB (ref 70–99)
POTASSIUM: 3.5 meq/L — AB (ref 3.7–5.3)
Sodium: 138 mEq/L (ref 137–147)

## 2013-07-22 LAB — MAGNESIUM: Magnesium: 2 mg/dL (ref 1.5–2.5)

## 2013-07-22 LAB — PHOSPHORUS: Phosphorus: 4.7 mg/dL — ABNORMAL HIGH (ref 2.3–4.6)

## 2013-07-22 MED ORDER — POTASSIUM CHLORIDE 10 MEQ/50ML IV SOLN
10.0000 meq | INTRAVENOUS | Status: AC
  Administered 2013-07-22 (×4): 10 meq via INTRAVENOUS
  Filled 2013-07-22 (×4): qty 50

## 2013-07-22 MED ORDER — FENTANYL CITRATE 0.05 MG/ML IJ SOLN
25.0000 ug | INTRAMUSCULAR | Status: DC | PRN
  Administered 2013-07-22: 100 ug via INTRAVENOUS
  Filled 2013-07-22: qty 2

## 2013-07-22 MED ORDER — FUROSEMIDE 10 MG/ML IJ SOLN
40.0000 mg | Freq: Four times a day (QID) | INTRAMUSCULAR | Status: AC
  Administered 2013-07-22 (×3): 40 mg via INTRAVENOUS
  Filled 2013-07-22 (×3): qty 4

## 2013-07-22 NOTE — Progress Notes (Signed)
NUTRITION FOLLOW-UP  DOCUMENTATION CODES Per approved criteria  -Morbid Obesity   INTERVENTION: If pt unable to tolerate extubation, resume Vital High Protein via NG tube @ goal rate of 70 ml/hr.   Tube feeding regimen provides 1680 kcal (60% of needs), 147 grams of protein, and 1404 ml of H2O.    NUTRITION DIAGNOSIS: Inadequate oral intake related to inability to eat as evidenced by NPO status; ongoing.   Goal: Enteral nutrition to provide 60-70% of estimated calorie needs (22-25 kcals/kg ideal body weight) and 100% of estimated protein needs, based on ASPEN guidelines for permissive underfeeding in critically ill obese individuals, not met.   Monitor:  Respiratory status, weight trend, labs, TF tolerance  ASSESSMENT: 33 years old morbid obese patient with PMH relevant for HTN, OSA, asthma. Underwent laparoscopic vertical sleeve gastrectomy on 07/14/13 ([place]). Pt required intubation Chest X ray with bilateral diffuse patchy infiltrates compatible with ARDS vs cardiogenic pulmonary edema.   Patient is currently intubated on ventilator support MV: 12.5 L/min Temp (24hrs), Avg:100.6 F (38.1 C), Min:99.8 F (37.7 C), Max:101.8 F (38.8 C)  Propofol:off  Gastric residuals @ 0 mL. Per RN, pt is tolerating TF well.  Per nursing rounds and RN, attempting to extubate today. Pt has been weaning and doing well. TF currently off for extubation this morning.   Height: Ht Readings from Last 1 Encounters:  07/18/13 5' 4.17" (1.63 m)    Weight: Wt Readings from Last 1 Encounters:  07/22/13 373 lb (169.192 kg)    Ideal Body Weight: 54.5 kg   BMI:  Body mass index is 63.68 kg/(m^2)., Morbid Obesity   Estimated Nutritional Needs: Kcal: 2825 Goal Kcal: 8864-8472 (60-67% of needs) Protein: >/= 136 grams Fluid: 1.5 L/day  Skin: abdominal incision  Diet Order: NPO   Intake/Output Summary (Last 24 hours) at 07/22/13 1003 Last data filed at 07/22/13 0900  Gross per 24 hour   Intake 4092.15 ml  Output   9685 ml  Net -5592.85 ml    Last BM: PTA   Labs:   Recent Labs Lab 07/20/13 0500 07/21/13 0330 07/22/13 0540  NA 142 144 138  K 4.3 3.4* 3.5*  CL 103 104 95*  CO2 31 33* 29  BUN '11 15 23  ' CREATININE 0.72 0.89 0.92  CALCIUM 7.8* 7.8* 9.1  MG 2.4 2.1 2.0  PHOS 2.3 1.4* 4.7*  GLUCOSE 124* 148* 151*    CBG (last 3)   Recent Labs  07/21/13 2322 07/22/13 0312 07/22/13 0829  GLUCAP 136* 140* 154*   Lab Results  Component Value Date   TRIG 274* 07/19/2013    Scheduled Meds: . albuterol  2.5 mg Nebulization Q6H  . antiseptic oral rinse  15 mL Mouth Rinse QID  . budesonide  0.25 mg Nebulization 4 times per day  . chlorhexidine  15 mL Mouth Rinse BID  . feeding supplement (VITAL HIGH PROTEIN)  1,000 mL Per Tube Q24H  . fentaNYL  50 mcg Intravenous Once  . heparin  5,000 Units Subcutaneous 3 times per day  . pantoprazole (PROTONIX) IV  40 mg Intravenous QHS  . piperacillin-tazobactam (ZOSYN)  IV  3.375 g Intravenous 3 times per day  . vancomycin  2,000 mg Intravenous Q12H    Continuous Infusions: . dexmedetomidine 0.4 mcg/kg/hr (07/22/13 0900)  . fentaNYL infusion INTRAVENOUS 150 mcg/hr (07/22/13 0900)  . propofol Stopped (07/20/13 0907)    Carrolyn Leigh, BS Dietetic Intern Pager: [phone]

## 2013-07-22 NOTE — Progress Notes (Signed)
PULMONARY / CRITICAL CARE MEDICINE   Name: Melissa Erickson MRN: [id number] DOB: June 09, 1980    ADMISSION DATE:  07/18/2013   BRIEF PATIENT DESCRIPTION:  73 F with hx of morbid obesity, htn, OSA, asthma underwent laparoscopic vertical sleeve gastrectomy 5/28 in [place], [place]. Was discharged home the following day. Adm to PCCM service early AM 6/01 after presenting with acute hypoxic failure requiring intubation shortly after arrival.  SIGNIFICANT EVENTS / STUDIES:  6/01 Echocardiogram: LVEF 50-55%. Mild LVH. Grade 1 diastolic dysfxn 2/83 LE venous Dopplers: no evidence of DVT  LINES / TUBES: ETT 6/01 >>  L radial A line 6/01 >>  R IJ CVL 6/01 >>  NGT 6/2 by surgery in the OR, if out then IR can replace>>>  CULTURES: Urine 6/01 >>  Resp 6/01 >>  Blood 6/01 >>   ANTIBIOTICS: Vanc 6/01 >>  Zosyn 6/01 >>   SUBJECTIVE:  Arousable and following commands  VITAL SIGNS: Temp:  [99.8 F (37.7 C)-101.8 F (38.8 C)] 100 F (37.8 C) (06/05 0833) Pulse Rate:  [81-93] 86 (06/05 1000) Resp:  [19-31] 30 (06/05 1000) BP: (101-128)/(57-82) 114/68 mmHg (06/05 1000) SpO2:  [96 %-100 %] 99 % (06/05 1000) Arterial Line BP: (97)/(89) 97/89 mmHg (06/04 1100) FiO2 (%):  [40 %] 40 % (06/05 0800) Weight:  [373 lb (169.192 kg)] 373 lb (169.192 kg) (06/05 0431)  HEMODYNAMICS:   VENTILATOR SETTINGS: Vent Mode:  [-] PSV FiO2 (%):  [40 %] 40 % Set Rate:  [18 bmp] 18 bmp PEEP:  [5 cmH20] 5 cmH20 Pressure Support:  [10 cmH20] 10 cmH20 Plateau Pressure:  [26 cmH20-27 cmH20] 27 cmH20 INTAKE / OUTPUT: Intake/Output     06/04 0701 - 06/05 0700 06/05 0701 - 06/06 0700   I.V. (mL/kg) 1331 (7.9) 139.6 (0.8)   NG/GT 1600 210   IV Piggyback 650 500   Total Intake(mL/kg) 3581 (21.2) 849.6 (5)   Urine (mL/kg/hr) 9405 (2.3) 280 (0.5)   Total Output 9405 280   Net -5824.1 +569.6         PHYSICAL EXAMINATION: General:  Morbidly obese, intubated, lethargic but arousable Neuro: PERRL, EOM-I, moving  all ext to command HEENT: [place]/AT, MMM Cardiovascular: distant HS, regular, no M Lungs: no wheezes, distant BS Abdomen: Soft, 3 small surgical wounds with staples present, no purulence, BS diminished Ext: warm, no edema  LABS:  CBC  Recent Labs Lab 07/20/13 0500 07/21/13 0330 07/22/13 0540  WBC 9.6 9.9 10.8*  HGB 10.6* 10.6* 12.4  HCT 34.9* 33.7* 39.5  PLT 151 129* 123*   Coag's  Recent Labs Lab 07/18/13 0233 07/19/13 1022  INR 1.51* 1.45   BMET  Recent Labs Lab 07/20/13 0500 07/21/13 0330 07/22/13 0540  NA 142 144 138  K 4.3 3.4* 3.5*  CL 103 104 95*  CO2 31 33* 29  BUN 11 15 23   CREATININE 0.72 0.89 0.92  GLUCOSE 124* 148* 151*   Electrolytes  Recent Labs Lab 07/20/13 0500 07/21/13 0330 07/22/13 0540  CALCIUM 7.8* 7.8* 9.1  MG 2.4 2.1 2.0  PHOS 2.3 1.4* 4.7*   Sepsis Markers  Recent Labs Lab 07/18/13 0254  LATICACIDVEN 7.29*   ABG  Recent Labs Lab 07/21/13 0236 07/21/13 0450 07/22/13 0357  PHART 7.476* 7.431 7.420  PCO2ART 48.3* 54.3* 48.1*  PO2ART 64.0* 68.1* 73.3*   Liver Enzymes  Recent Labs Lab 07/18/13 0233 07/19/13 0350 07/20/13 0500  AST 348* 165* 60*  ALT 418* 483* 295*  ALKPHOS 70 55 53  BILITOT  1.1 0.6 0.5  ALBUMIN 3.3* 2.8* 2.6*   Cardiac Enzymes  Recent Labs Lab 07/18/13 0233 07/19/13 0855  TROPONINI  --  0.98*  PROBNP 3595.0*  --    Glucose  Recent Labs Lab 07/21/13 1207 07/21/13 1652 07/21/13 2001 07/21/13 2322 07/22/13 0312 07/22/13 0829  GLUCAP 167* 148* 129* 136* 140* 154*   CXR: severe ARDS pattern  ASSESSMENT / PLAN:  PULMONARY A: Acute resp failure ARDS Very asynchronous with vent P:   - SBT to extubate hopefully today. - Cont vent bundle - F/U ABG and CXR in AM.  CARDIOVASCULAR A:  Sinus tachycardia - reactive Troponin mildly positive. P:  - Trop elevated, cardiology feels is demand ischemia and would continue current measurement. - No anti-coag as not ACS. - Diastolic  heart failure, diurese.  RENAL A:   AKI, nonoliguric, improved. Low K and Phos. P:   - Monitor BMET intermittently. - Monitor I/Os. - Correct electrolytes as indicated. - Lasix 40 mg IV q6 x3 doses.  GASTROINTESTINAL A:   Severe obesity S/P bariatric surgery 5/28 To OR on 6/2, no leak from sleeve. P:   - SUP: IV PPI. - Continue TF per surgery. - NGT placed by surgery in the OR, if patient is to pull out then IR can replace does not need to be in the OR.  Will keep even post extubation.  HEMATOLOGIC A:   No issues P:  - DVT px: SQ heparin. - Monitor CBC intermittently. - Transfuse per usual ICU guidelines.  INFECTIOUS A:  Severe sepsis/SIRS P:   - Micro and abx as above.  ENDOCRINE A:   Mild stress induced hyperglycemia without prior hx of DM P:   - Monitor glu on chem panels. - SSI for glu > 180 specially now that TF are starting.  NEUROLOGIC A:   ICU associated discomfort Ventilator dyssynchrony P:   - RASS goal: 0 - PAD protocol - Avoid further NMB. - D/C all sedation for hopefully extubation today.  TODAY'S SUMMARY:  Weaning well, tolerating reasonable FiO2 and PEEP at this point, will continue to diurese, replace electrolytes, SBT to hopefully extubate today.  I have personally obtained a history, examined the patient, evaluated laboratory and imaging results, formulated the assessment and plan and placed orders.  CRITICAL CARE: The patient is critically ill with multiple organ systems failure and requires high complexity decision making for assessment and support, frequent evaluation and titration of therapies, application of advanced monitoring technologies and extensive interpretation of multiple databases. Critical Care Time devoted to patient care services described in this note is 35 minutes.   Rush Farmer, M.D. [facility] Pulmonary/Critical Care Medicine. Pager: [phone]. After hours pager: [phone].

## 2013-07-22 NOTE — Progress Notes (Signed)
Intern note/chart reviewed. Revisions made.  Tabitha Riggins RD, LDN, CNSC [phone] Pager [phone] After Hours Pager  

## 2013-07-22 NOTE — Procedures (Signed)
Extubation Procedure Note  Patient Details:   Name: Shine Scrogham Drumgoole DOB: 1980/12/31 MRN: [id number]   Airway Documentation:   Pt extubated to 2L [place]. No stridor noted. BBS dim and equal. Pt able to vocalize name.  Evaluation  O2 sats: stable throughout and currently acceptable Complications: No apparent complications Patient did tolerate procedure well. Bilateral Breath Sounds: Clear;Diminished;Rhonchi Suctioning: Oral;Airway   Lissa Merlin 07/22/2013, 11:41 AM

## 2013-07-22 NOTE — Progress Notes (Signed)
Please consider PT and OT consults when appropriate. Noted extubation. [phone]

## 2013-07-22 NOTE — Progress Notes (Signed)
Wasted 40cc of Fentanyl with [place], RN.

## 2013-07-23 ENCOUNTER — Inpatient Hospital Stay (HOSPITAL_COMMUNITY): Payer: BC Managed Care – PPO

## 2013-07-23 LAB — GLUCOSE, CAPILLARY
GLUCOSE-CAPILLARY: 124 mg/dL — AB (ref 70–99)
GLUCOSE-CAPILLARY: 118 mg/dL — AB (ref 70–99)
Glucose-Capillary: 161 mg/dL — ABNORMAL HIGH (ref 70–99)
Glucose-Capillary: 128 mg/dL — ABNORMAL HIGH (ref 70–99)
Glucose-Capillary: 109 mg/dL — ABNORMAL HIGH (ref 70–99)

## 2013-07-23 LAB — BLOOD GAS, ARTERIAL
Acid-Base Excess: 4.8 mmol/L — ABNORMAL HIGH (ref 0.0–2.0)
BICARBONATE: 28.2 meq/L — AB (ref 20.0–24.0)
DRAWN BY: 28701
O2 CONTENT: 2.5 L/min
O2 SAT: 93.6 %
PATIENT TEMPERATURE: 98.2
TCO2: 29.4 mmol/L (ref 0–100)
pCO2 arterial: 36.8 mmHg (ref 35.0–45.0)
pH, Arterial: 7.495 — ABNORMAL HIGH (ref 7.350–7.450)
pO2, Arterial: 67.4 mmHg — ABNORMAL LOW (ref 80.0–100.0)

## 2013-07-23 LAB — CBC
HCT: 42.7 % (ref 36.0–46.0)
Hemoglobin: 13.4 g/dL (ref 12.0–15.0)
MCH: 26.9 pg (ref 26.0–34.0)
MCHC: 31.4 g/dL (ref 30.0–36.0)
MCV: 85.7 fL (ref 78.0–100.0)
PLATELETS: 139 10*3/uL — AB (ref 150–400)
RBC: 4.98 MIL/uL (ref 3.87–5.11)
RDW: 14.4 % (ref 11.5–15.5)
WBC: 12.5 10*3/uL — AB (ref 4.0–10.5)

## 2013-07-23 LAB — BASIC METABOLIC PANEL
BUN: 23 mg/dL (ref 6–23)
CO2: 26 mEq/L (ref 19–32)
Calcium: 9.3 mg/dL (ref 8.4–10.5)
Chloride: 93 mEq/L — ABNORMAL LOW (ref 96–112)
Creatinine, Ser: 0.83 mg/dL (ref 0.50–1.10)
GFR calc non Af Amer: >90 mL/min (ref >90)
Glucose, Bld: 129 mg/dL — ABNORMAL HIGH (ref 70–99)
POTASSIUM: 3.1 meq/L — AB (ref 3.7–5.3)
SODIUM: 135 meq/L — AB (ref 137–147)

## 2013-07-23 LAB — VANCOMYCIN, TROUGH: Vancomycin Tr: 14.4 ug/mL (ref 10.0–20.0)

## 2013-07-23 LAB — MAGNESIUM: MAGNESIUM: 2 mg/dL (ref 1.5–2.5)

## 2013-07-23 LAB — PHOSPHORUS: PHOSPHORUS: 3 mg/dL (ref 2.3–4.6)

## 2013-07-23 MED ORDER — PANTOPRAZOLE SODIUM 40 MG PO TBEC
40.0000 mg | DELAYED_RELEASE_TABLET | Freq: Every day | ORAL | Status: DC
  Administered 2013-07-24 – 2013-07-27 (×4): 40 mg via ORAL
  Filled 2013-07-23 (×5): qty 1

## 2013-07-23 MED ORDER — FENTANYL CITRATE 0.05 MG/ML IJ SOLN
12.5000 ug | INTRAMUSCULAR | Status: DC | PRN
  Administered 2013-07-23: 12.5 ug via INTRAVENOUS
  Filled 2013-07-23: qty 2

## 2013-07-23 NOTE — Progress Notes (Addendum)
PULMONARY / CRITICAL CARE MEDICINE   Name: Melissa Erickson MRN: [id number] DOB: [birth year]    ADMISSION DATE:  07/18/2013   BRIEF PATIENT DESCRIPTION:  27 F with hx of morbid obesity, htn, OSA, asthma underwent laparoscopic vertical sleeve gastrectomy 5/28 in [place], [place]. Was discharged home the following day. Adm to PCCM service early AM 6/01 after presenting with acute hypoxic failure requiring intubation shortly after arrival.   LINES / TUBES: ETT 6/01 >> 6/5 L radial A line 6/01 >>  R IJ CVL 6/01 >>  NGT 6/2 > 07/23/13  CULTURES: Urine 6/01 >> neg Resp 6/01 >> not done Blood 6/01 >>   ANTIBIOTICS: Vanc 6/01 >> dc Zosyn 6/01 >>      SIGNIFICANT EVENTS / STUDIES:  6/01 Echocardiogram: LVEF 50-55%. Mild LVH. Grade 1 diastolic dysfxn 3/66 LE venous Dopplers: no evidence of DVT 07/20/13 - negative laparascopy and EGD. Per [facility] - From a surgery standpoint tube feeds can be started through the NG. If the NG inadvertently comes out, it would have to be replace by IR.  07/21/13: tolerating tube feeds. [facility] signed off  SUBJECTIVE:  Sitting in chair     07/23/13: Staff MD rounds: Lying in bed. Denies complaints. Not on TPN or tube feeds. Has diet order. NP feels patient can move to SDU. Looks deconditioned. INpatient rehab recommended by PT  VITAL SIGNS: Temp:  [98.2 F (36.8 C)-99.2 F (37.3 C)] 98.2 F (36.8 C) (06/06 0747) Pulse Rate:  [79-97] 97 (06/06 1000) Resp:  [17-38] 20 (06/06 1000) BP: (100-135)/(57-77) 122/71 mmHg (06/06 1000) SpO2:  [94 %-97 %] 97 % (06/06 1000) Weight:  [159.666 kg (352 lb)] 159.666 kg (352 lb) (06/06 0500)  HEMODYNAMICS:   VENTILATOR SETTINGS: Vent Mode:  [-] Stand-by INTAKE / OUTPUT: Intake/Output     06/05 0701 - 06/06 0700 06/06 0701 - 06/07 0700   I.V. (mL/kg) 646 (4) 40 (0.3)   NG/GT 340    IV Piggyback 1850    Total Intake(mL/kg) 2836 (17.8) 40 (0.3)   Urine (mL/kg/hr) 5555 (1.4) 125 (0.2)   Total Output 5555 125   Net -2719  -85         PHYSICAL EXAMINATION: General:  Morbidly obese, sitting in chair (lying in bed at time of MD rounds). Looks deconditioned Neuro: follows commands. Denies complaints HEENT: [place]/AT, MMM Cardiovascular: distant HS, regular, no M Lungs: no wheezes, distant BS Abdomen: Soft, 3 small surgical wounds with staples present, no purulence, BS diminished, eating Ext: warm, no edema  LABS:  PULMONARY  Recent Labs Lab 07/20/13 2129 07/21/13 0236 07/21/13 0450 07/22/13 0357 07/23/13 0450  PHART 7.484* 7.476* 7.431 7.420 7.495*  PCO2ART 50.0* 48.3* 54.3* 48.1* 36.8  PO2ART 79.0* 64.0* 68.1* 73.3* 67.4*  HCO3 36.9* 35.1* 34.8* 30.4* 28.2*  TCO2 38 36 36.3 31.8 29.4  O2SAT 95.0 91.0 92.3 94.1 93.6    CBC  Recent Labs Lab 07/21/13 0330 07/22/13 0540 07/23/13 0530  HGB 10.6* 12.4 13.4  HCT 33.7* 39.5 42.7  WBC 9.9 10.8* 12.5*  PLT 129* 123* 139*    COAGULATION  Recent Labs Lab 07/18/13 0233 07/19/13 1022  INR 1.51* 1.45    CARDIAC   Recent Labs Lab 07/19/13 0855  TROPONINI 0.98*    Recent Labs Lab 07/18/13 0233  PROBNP 3595.0*     CHEMISTRY  Recent Labs Lab 07/19/13 0350 07/20/13 0500 07/21/13 0330 07/22/13 0540 07/23/13 0530  NA 144 142 144 138 135*  K 3.8 4.3 3.4* 3.5* 3.1*  CL 103 103 104 95* 93*  CO2 32 31 33* 29 26  GLUCOSE 137* 124* 148* 151* 129*  BUN 15 11 15 23 23   CREATININE 0.81 0.72 0.89 0.92 0.83  CALCIUM 8.1* 7.8* 7.8* 9.1 9.3  MG  --  2.4 2.1 2.0 2.0  PHOS  --  2.3 1.4* 4.7* 3.0   Estimated Creatinine Clearance: 146.1 ml/min (by C-G formula based on Cr of 0.83).   LIVER  Recent Labs Lab 07/18/13 0233 07/19/13 0350 07/19/13 1022 07/20/13 0500  AST 348* 165*  --  60*  ALT 418* 483*  --  295*  ALKPHOS 70 55  --  53  BILITOT 1.1 0.6  --  0.5  PROT 7.9 6.5  --  6.3  ALBUMIN 3.3* 2.8*  --  2.6*  INR 1.51*  --  1.45  --      INFECTIOUS  Recent Labs Lab 07/18/13 0254  LATICACIDVEN 7.29*      ENDOCRINE CBG (last 3)   Recent Labs  07/23/13 0315 07/23/13 0746 07/23/13 1142  GLUCAP 124* 118* 161*         IMAGING x48h  Dg Chest Port 1 View  07/22/2013   CLINICAL DATA:  ET tube placement.  EXAM: PORTABLE CHEST - 1 VIEW  COMPARISON:  07/21/2013.  FINDINGS: Moderately low lung volumes with vascular congestion. The cardiomediastinal silhouette is accentuated. ET tube 2.9 cm above the carina. No pneumothorax.  IMPRESSION: ET tube has been pulled back to a position 2.9 cm above carina. Mild vascular congestion persists.   Electronically Signed   By: Rolla Flatten M.D.   On: 07/22/2013 07:37      ASSESSMENT / PLAN:  PULMONARY A: Acute resp failure(resolved) ARDS with Very asynchronous with vent   - Extubated 6/5 - ?OSA  - doing well 07/23/13  P Monitor Aggressive pulm toilet O2 for pulse ox > 88%  CARDIOVASCULAR A:  Sinus tachycardia - reactive Troponin mildly positive.   - ongoing diuresis P:  - Trop elevated, cardiology feels is demand ischemia and would continue current measurement. - No anti-coag as not ACS. - Diastolic heart failure, diurese.  RENAL  A:   AKI, nonoliguric, improved.  P:   - Monitor BMET intermittently. - Monitor I/Os. - Correct electrolytes as indicated.   GASTROINTESTINAL A:   Severe obesity S/P bariatric surgery 5/28 To OR on 6/2, no leak from sleeve.   - swallowing ok. On bariatric diet. NG tube still in based on [facility] 07/20/13 post op recommendation but they signed off 07/21/13 P:   - SUP: IV PPI 0- change to PO. - DC NG tube (d/w Dr Ninfa Linden of [facility])  HEMATOLOGIC A:   No issues P:  - DVT px: SQ heparin. - Monitor CBC intermittently. - Transfuse per usual ICU guidelines.  INFECTIOUS A:  Severe sepsis/SIRS P:   - Micro and abx as above.  ENDOCRINE CBG (last 3)   Recent Labs  07/22/13 2327 07/23/13 0315 07/23/13 0746  GLUCAP 107* 124* 118*     A:   Mild stress induced hyperglycemia without prior  hx of DM P:   - Monitor glu on chem panels. - SSI   NEUROLOGIC A:   ICU associated discomfort   - some overnight hallucination but re-oriented  P:   - RASS goal: 0 - PAD protocol; keep fent low dose prn for pain  -use haldol prn  .  TODAY'S SUMMARY:  Extubated and eating.  tx to sdu and to triad.  Richardson Landry Minor ACNP Maryanna Shape PCCM Pager [phone] till 3 pm If no answer page [phone] 07/23/2013, 11:43 AM   STAFF NOTe   - move to sdu for TRH take over as primary. Orders simplified. Will check with [facility] about removing NG tube which I suspect can be. No family at bedside. Rest per NP   Dr. Brand Males, M.D., [facility].C.P Pulmonary and Critical Care Medicine Staff Physician [place] Pulmonary and Critical Care Pager: [phone], If no answer or between  15:00h - 7:00h: call [phone]  07/23/2013 4:33 PM

## 2013-07-23 NOTE — Progress Notes (Signed)
Rehab Admissions Coordinator Note:  Patient was screened by Cleatrice Burke for appropriateness for an Inpatient Acute Rehab Consult per PT recommendation. At this time, we are recommending Inpatient Rehab consult and OT eval. Please order.  Audelia Acton [facility] 07/23/2013, 11:29 AM  I can be reached at [phone].

## 2013-07-23 NOTE — Evaluation (Signed)
Physical Therapy Evaluation Patient Details Name: Melissa Erickson MRN: [id number] DOB: May 05, 1980 Today's Date: 07/23/2013   History of Present Illness  1 F with hx of morbid obesity, htn, OSA, asthma underwent laparoscopic vertical sleeve gastrectomy 5/28 in [place], [place]. Was discharged home the following day. Adm to PCCM service early AM 6/01 after presenting with acute hypoxic failure requiring intubation shortly after arrival. VDRF 6/1-6/5  Clinical Impression  Pt pleasant and stating she had only been up one time since her gastric surgery but unable to recall returning home prior to admission to [facility]. Pt with 33yo dgtr at home and states she can have other assist at home. Pt will benefit from acute therapy to address below deficits, maximize function and independence to decrease burden of care and return pt to PLOF. Pt encouraged to mobilize daily with nursing. Pt currently needs cues to sequence and perform all transfers as well as physical assist.     Follow Up Recommendations CIR;Supervision/Assistance - 24 hour    Equipment Recommendations  Rolling walker with 5" wheels (bari RW)    Recommendations for Other Services OT consult;Rehab consult     Precautions / Restrictions Precautions Precautions: Fall Precaution Comments: NGT Restrictions Weight Bearing Restrictions: No      Mobility  Bed Mobility Overal bed mobility: Needs Assistance;+2 for physical assistance Bed Mobility: Rolling;Sidelying to Sit Rolling: Mod assist;+2 for physical assistance Sidelying to sit: Mod assist;+2 for physical assistance       General bed mobility comments: cues for sequence, use of rail and assist to elevate trunk from surface and move legs to EOB with bed fully deflatted  Transfers Overall transfer level: Needs assistance   Transfers: Sit to/from Stand;Stand Pivot Transfers Sit to Stand: Min assist;+2 safety/equipment Stand pivot transfers: Min assist;+2 safety/equipment        General transfer comment: cues for hand placement, foot position and sequence with use of RW to stand and pivot to chair. Pt denied attempting gait  Ambulation/Gait                Stairs            Wheelchair Mobility    Modified Rankin (Stroke Patients Only)       Balance Overall balance assessment: Needs assistance   Sitting balance-Leahy Scale: Good       Standing balance-Leahy Scale: Poor                               Pertinent Vitals/Pain Pt with BP 114/63 supine 119/90 sitting  HR 102 sats 95% on 3L No pain    Home Living Family/patient expects to be discharged to:: Private residence Living Arrangements: Children Available Help at Discharge: Family;Available PRN/intermittently Type of Home: Apartment Home Access: Level entry     Home Layout: One level Home Equipment: None      Prior Function Level of Independence: Independent         Comments: Pt has an 35yo dgtr and states god-mother can assist her some at DC. 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     Hand Dominance        Extremity/Trunk Assessment   Upper Extremity Assessment: Generalized weakness           Lower Extremity Assessment: Generalized weakness         Communication   Communication: No difficulties  Cognition Arousal/Alertness: Awake/alert Behavior During Therapy: [facility] for  tasks assessed/performed Overall Cognitive Status: Impaired/Different from baseline Area of Impairment: Memory;Orientation Orientation Level: Time   Memory: Decreased short-term memory              General Comments      Exercises        Assessment/Plan    PT Assessment Patient needs continued PT services  PT Diagnosis Difficulty walking;Generalized weakness   PT Problem List Decreased strength;Decreased activity tolerance;Decreased balance;Cardiopulmonary status limiting activity;Decreased mobility;Decreased knowledge of use of  DME;Obesity  PT Treatment Interventions Gait training;DME instruction;Functional mobility training;Therapeutic activities;Therapeutic exercise;Patient/family education   PT Goals (Current goals can be found in the Care Plan section) Acute Rehab PT Goals Patient Stated Goal: be able to go get my toes done PT Goal Formulation: With patient Time For Goal Achievement: 08/06/13 Potential to Achieve Goals: Good    Frequency Min 3X/week   Barriers to discharge Decreased caregiver support      Co-evaluation               End of Session Equipment Utilized During Treatment: Gait belt;Oxygen (sheet as belt) Activity Tolerance: Patient limited by fatigue Patient left: in chair;with call bell/phone within reach Nurse Communication: Mobility status;Precautions         Time: 2025-4270 PT Time Calculation (min): 25 min   Charges:   PT Evaluation $Initial PT Evaluation Tier I: 1 Procedure PT Treatments $Therapeutic Activity: 8-22 mins   PT G Codes:          Jamani Bearce B Chaney Maclaren 07/23/2013, 10:24 AM Elwyn Reach, [place]

## 2013-07-23 NOTE — Progress Notes (Signed)
Pt convinced that she hears her friend's voice outside her window and thinks she needs to get up to go help her. Reoriented the pt and provided her support and reassurance that she was safe and protected.  Later the patient told me to tell her friend "Sherrie Sport" to come on in her room and stop standing outside her door, and upon questioning where she thought Sherrie Sport was she pointed to Marcello Moores, a Primary school teacher at the nurses station. Once again patient was reoriented and support and reassurance was provided.  Will continue to provide reorientation and promote a normal sleep/wake cycle.  Nicole Cella Dawsyn Ramsaran

## 2013-07-24 ENCOUNTER — Inpatient Hospital Stay (HOSPITAL_COMMUNITY): Payer: BC Managed Care – PPO

## 2013-07-24 ENCOUNTER — Encounter (HOSPITAL_COMMUNITY): Payer: Self-pay | Admitting: Pulmonary Disease

## 2013-07-24 LAB — CBC WITH DIFFERENTIAL/PLATELET
BASOS ABS: 0 10*3/uL (ref 0.0–0.1)
Basophils Relative: 0 % (ref 0–1)
Eosinophils Absolute: 0.2 10*3/uL (ref 0.0–0.7)
Eosinophils Relative: 2 % (ref 0–5)
HEMATOCRIT: 41.1 % (ref 36.0–46.0)
Hemoglobin: 13 g/dL (ref 12.0–15.0)
LYMPHS PCT: 18 % (ref 12–46)
Lymphs Abs: 2.1 10*3/uL (ref 0.7–4.0)
MCH: 27.2 pg (ref 26.0–34.0)
MCHC: 31.6 g/dL (ref 30.0–36.0)
MCV: 86 fL (ref 78.0–100.0)
MONOS PCT: 7 % (ref 3–12)
Monocytes Absolute: 0.8 10*3/uL (ref 0.1–1.0)
NEUTROS ABS: 8.4 10*3/uL — AB (ref 1.7–7.7)
Neutrophils Relative %: 73 % (ref 43–77)
PLATELETS: 144 10*3/uL — AB (ref 150–400)
RBC: 4.78 MIL/uL (ref 3.87–5.11)
RDW: 14.2 % (ref 11.5–15.5)
WBC: 11.5 10*3/uL — ABNORMAL HIGH (ref 4.0–10.5)

## 2013-07-24 LAB — BASIC METABOLIC PANEL
BUN: 19 mg/dL (ref 6–23)
CO2: 27 mEq/L (ref 19–32)
Calcium: 9.1 mg/dL (ref 8.4–10.5)
Chloride: 98 mEq/L (ref 96–112)
Creatinine, Ser: 0.75 mg/dL (ref 0.50–1.10)
GFR calc Af Amer: >90 mL/min (ref >90)
Glucose, Bld: 132 mg/dL — ABNORMAL HIGH (ref 70–99)
Potassium: 2.9 mEq/L — CL (ref 3.7–5.3)
Sodium: 141 mEq/L (ref 137–147)

## 2013-07-24 LAB — MAGNESIUM
MAGNESIUM: 2.3 mg/dL (ref 1.5–2.5)
Magnesium: 2.2 mg/dL (ref 1.5–2.5)

## 2013-07-24 LAB — CULTURE, BLOOD (ROUTINE X 2)
Culture: NO GROWTH
Culture: NO GROWTH

## 2013-07-24 LAB — GLUCOSE, CAPILLARY
GLUCOSE-CAPILLARY: 140 mg/dL — AB (ref 70–99)
GLUCOSE-CAPILLARY: 102 mg/dL — AB (ref 70–99)
Glucose-Capillary: 125 mg/dL — ABNORMAL HIGH (ref 70–99)
Glucose-Capillary: 124 mg/dL — ABNORMAL HIGH (ref 70–99)
Glucose-Capillary: 138 mg/dL — ABNORMAL HIGH (ref 70–99)

## 2013-07-24 LAB — PHOSPHORUS: Phosphorus: 2.6 mg/dL (ref 2.3–4.6)

## 2013-07-24 LAB — PRO B NATRIURETIC PEPTIDE: PRO B NATRI PEPTIDE: 75.7 pg/mL (ref 0–125)

## 2013-07-24 LAB — POTASSIUM: POTASSIUM: 3.3 meq/L — AB (ref 3.7–5.3)

## 2013-07-24 LAB — LACTIC ACID, PLASMA: LACTIC ACID, VENOUS: 1.2 mmol/L (ref 0.5–2.2)

## 2013-07-24 MED ORDER — DIPHENHYDRAMINE HCL 25 MG PO CAPS
25.0000 mg | ORAL_CAPSULE | Freq: Four times a day (QID) | ORAL | Status: DC | PRN
  Administered 2013-07-24 (×2): 25 mg via ORAL
  Filled 2013-07-24 (×2): qty 1

## 2013-07-24 MED ORDER — ONDANSETRON HCL 4 MG/2ML IJ SOLN
4.0000 mg | Freq: Four times a day (QID) | INTRAMUSCULAR | Status: DC
  Administered 2013-07-24 – 2013-07-27 (×10): 4 mg via INTRAVENOUS
  Filled 2013-07-24 (×11): qty 2

## 2013-07-24 MED ORDER — POTASSIUM CHLORIDE CRYS ER 20 MEQ PO TBCR
40.0000 meq | EXTENDED_RELEASE_TABLET | ORAL | Status: AC
  Administered 2013-07-24 (×2): 40 meq via ORAL
  Filled 2013-07-24 (×2): qty 2

## 2013-07-24 MED ORDER — ONDANSETRON HCL 4 MG/2ML IJ SOLN: 4.0000 mg | Freq: Four times a day (QID) | INTRAMUSCULAR | Status: DC | PRN

## 2013-07-24 MED ORDER — BUDESONIDE 0.25 MG/2ML IN SUSP
0.2500 mg | Freq: Two times a day (BID) | RESPIRATORY_TRACT | Status: DC
  Administered 2013-07-25 – 2013-07-27 (×4): 0.25 mg via RESPIRATORY_TRACT
  Filled 2013-07-24 (×7): qty 2

## 2013-07-24 MED ORDER — ALBUTEROL SULFATE (2.5 MG/3ML) 0.083% IN NEBU
2.5000 mg | INHALATION_SOLUTION | Freq: Two times a day (BID) | RESPIRATORY_TRACT | Status: DC
  Administered 2013-07-25 – 2013-07-27 (×4): 2.5 mg via RESPIRATORY_TRACT
  Filled 2013-07-24 (×4): qty 3

## 2013-07-24 NOTE — Progress Notes (Signed)
[facility] Progress Note Patient Name: Melissa Erickson DOB: 1980/05/25 MRN: [id number]  Date of Service  07/24/2013   HPI/Events of Note  Hypokalemia   eICU Interventions  Potassium replaced   Intervention Category Major Interventions: Electrolyte abnormality - evaluation and management  Guadelupe Sabin Deterding 07/24/2013, 6:49 AM

## 2013-07-24 NOTE — Progress Notes (Signed)
[place] TEAM 1 - Stepdown/ICU TEAM Progress Note  Syona R Drumgoole NWG:[id number] DOB: November 01, 1980 DOA: 07/18/2013 PCP: Leamon Arnt, MD  Admit HPI / Brief Narrative: 65 BF PMHx morbid obesity, HTN, OSA, well-controlled Asthma, underwent laparoscopic vertical sleeve gastrectomy (bariatric surgery) 5/28 in [facility] Dr. Keturah Barre Voellinger (office: [phone], cell: [phone]), [place]. Was discharged home the following day. Adm to PCCM service early AM 6/01 after presenting with acute hypoxic failure requiring intubation shortly after arrival.   HPI/Subjective: 6/7 patient states had just urinated stood up grayed out fell back broke toilet. States clearly remembers her friend coming trying to help her to her feet however was not able to rise off the floor. States remembers EMT assessing her on the bathroom floor. States after EMT assessed her positive LOC, does not recall anything until she was on the third floor in the ICU. Currently negative abdominal pain, negative chest pain, negative SOB. Positive graying out when she tries to sit up.   Assessment/Plan: Acute resp failure(resolved) ARDS - Extubated 6/5  -Resolved  - O2 for pulse ox > 88%   OSA  -States does not use CPAP at home secondary to his liking the mask- -counseled patient on the sequela of not using her CPAP. Patient agreed to use CPAP while in the hospital. CPAP per respiratory   NSTEMI Type 2/Sinus tachycardia/ Troponin mildly positive. -Secondary to respiratory failure  -Diagnosed with diastolic CHF see echocardiogram will   Diastolic CHF -Considering patient's possible orthostatic fall will hold on beta blockers, diuretics, nitrates until patient more stable. -Obtain orthostatic vitals in the a.m.  AKI, nonoliguric,  -BUN/creatinine within normal limit -Continue to monitor renal status  Severe obesity  -S/P bariatric surgery 5/28 in [place] -6/2 EGD showed, no leak from sleeve.  - Continue on bariatric diet.    Severe sepsis/SIRS  -No source of infection would DC antibiotics. If patient spikes fever/leukocytosis would panculture     Code Status: FULL Family Communication: no family present at time of exam Disposition Plan: CIR vs SNF    Consultants: [name] (surgery) Dr. Coralie Keens (surgery) Dr.Wesam Nelda Marseille (PCCM.) Dr. Candee Furbish (cardiology)      Procedure/Significant Events: 6/01 Echocardiogram: LVEF 50-55%. Mild LVH. Grade 1 diastolic dysfxn  0/27 LE venous Dopplers: no evidence of DVT  6/2 EGD; no evidence of leak from sleeve gastrectomy 07/20/13 - negative laparascopy and EGD. Per [facility] - From a surgery standpoint tube feeds can be started through the NG. If the NG inadvertently comes out, it would have to be replace by IR.  07/21/13: tolerating tube feeds. [facility] signed off    Culture Urine 6/01 >> neg (final) Resp 6/01 >> not done  Blood 6/01 left antecubital/left hand negative    Antibiotics: Vanc 6/01 >> dc  Zosyn 6/01 >>    DVT prophylaxis: Heparin   Devices NA  LINES / TUBES:  ETT 6/01 >> 6/5  L radial A line 6/01 >>  R IJ CVL 6/01 >>  NGT 6/2 > 07/23/13     Continuous Infusions:   Objective: VITAL SIGNS: Temp: 97.8 F (36.6 C) (06/07 0400) Temp src: Oral (06/07 0400) BP: 113/60 mmHg (06/07 1100) Pulse Rate: 95 (06/07 1100) SPO2; FIO2:   Intake/Output Summary (Last 24 hours) at 07/24/13 1348 Last data filed at 07/24/13 1000  Gross per 24 hour  Intake    730 ml  Output   1095 ml  Net   -365 ml     Exam: General: A./O. x4, NAD, No  acute respiratory distress Lungs: Clear to auscultation bilaterally without wheezes or crackles Cardiovascular: Regular rate and rhythm without murmur gallop or rub normal S1 and S2 Abdomen: Morbidly obese, Nontender, nondistended, soft, bowel sounds positive, no rebound, no ascites, no appreciable mass, 7 x laparoscopic incision sites staples present, negative sign of infection Extremities: No  significant cyanosis, clubbing, or edema bilateral lower extremities  Data Reviewed: Basic Metabolic Panel:  Recent Labs Lab 07/20/13 0500 07/21/13 0330 07/22/13 0540 07/23/13 0530 07/24/13 0445  NA 142 144 138 135* 141  K 4.3 3.4* 3.5* 3.1* 2.9*  CL 103 104 95* 93* 98  CO2 31 33* 29 26 27   GLUCOSE 124* 148* 151* 129* 132*  BUN 11 15 23 23 19   CREATININE 0.72 0.89 0.92 0.83 0.75  CALCIUM 7.8* 7.8* 9.1 9.3 9.1  MG 2.4 2.1 2.0 2.0 2.2  PHOS 2.3 1.4* 4.7* 3.0 2.6   Liver Function Tests:  Recent Labs Lab 07/18/13 0233 07/19/13 0350 07/20/13 0500  AST 348* 165* 60*  ALT 418* 483* 295*  ALKPHOS 70 55 53  BILITOT 1.1 0.6 0.5  PROT 7.9 6.5 6.3  ALBUMIN 3.3* 2.8* 2.6*   No results found for this basename: LIPASE, AMYLASE,  in the last 168 hours No results found for this basename: AMMONIA,  in the last 168 hours CBC:  Recent Labs Lab 07/18/13 0233  07/20/13 0500 07/21/13 0330 07/22/13 0540 07/23/13 0530 07/24/13 0445  WBC 18.7*  < > 9.6 9.9 10.8* 12.5* 11.5*  NEUTROABS 14.7*  --   --   --   --   --  8.4*  HGB 14.1  < > 10.6* 10.6* 12.4 13.4 13.0  HCT 44.6  < > 34.9* 33.7* 39.5 42.7 41.1  MCV 88.8  < > 90.6 89.4 87.8 85.7 86.0  PLT 281  < > 151 129* 123* 139* 144*  < > = values in this interval not displayed. Cardiac Enzymes:  Recent Labs Lab 07/19/13 0855  TROPONINI 0.98*   BNP (last 3 results)  Recent Labs  03/06/13 2245 07/18/13 0233 07/24/13 0445  PROBNP 31.1 3595.0* 75.7   CBG:  Recent Labs Lab 07/23/13 1616 07/23/13 1949 07/24/13 0457 07/24/13 0750 07/24/13 1223  GLUCAP 128* 109* 125* 140* 124*    Recent Results (from the past 240 hour(s))  CULTURE, BLOOD (ROUTINE X 2)     Status: None   Collection Time    07/18/13  3:30 AM      Result Value Ref Range Status   Specimen Description BLOOD LEFT ANTECUBITAL   Final   Special Requests BOTTLES DRAWN AEROBIC AND ANAEROBIC 10CC EA   Final   Culture  Setup Time     Final   Value:  07/18/2013 09:10     Performed at [facility]   Culture     Final   Value: NO GROWTH 5 DAYS     Performed at [facility]   Report Status 07/24/2013 FINAL   Final  MRSA PCR SCREENING     Status: None   Collection Time    07/18/13  7:58 AM      Result Value Ref Range Status   MRSA by PCR NEGATIVE  NEGATIVE Final   Comment:            The GeneXpert MRSA Assay (FDA     approved for NASAL specimens     only), is one component of a     comprehensive MRSA colonization  surveillance program. It is not     intended to diagnose MRSA     infection nor to guide or     monitor treatment for     MRSA infections.  CULTURE, BLOOD (ROUTINE X 2)     Status: None   Collection Time    07/18/13  9:05 AM      Result Value Ref Range Status   Specimen Description BLOOD LEFT HAND   Final   Special Requests BOTTLES DRAWN AEROBIC ONLY 4CC   Final   Culture  Setup Time     Final   Value: 07/18/2013 12:34     Performed at [facility]   Culture     Final   Value: NO GROWTH 5 DAYS     Performed at [facility]   Report Status 07/24/2013 FINAL   Final  URINE CULTURE     Status: None   Collection Time    07/18/13 10:29 AM      Result Value Ref Range Status   Specimen Description URINE, CATHETERIZED   Final   Special Requests NONE   Final   Culture  Setup Time     Final   Value: 07/18/2013 16:33     Performed at [facility]     Final   Value: NO GROWTH     Performed at [facility]   Culture     Final   Value: NO GROWTH     Performed at [facility]   Report Status 07/19/2013 FINAL   Final     Studies:  Recent x-ray studies have been reviewed in detail by the Attending Physician  Scheduled Meds:  Scheduled Meds: . albuterol  2.5 mg Nebulization Q6H  . budesonide  0.25 mg Nebulization 4 times per day  . heparin  5,000 Units Subcutaneous 3 times per day  . pantoprazole  40 mg Oral Q1200  . piperacillin-tazobactam  (ZOSYN)  IV  3.375 g Intravenous 3 times per day    Time spent on care of this patient: 40 mins   Allie Bossier , MD   [facility] Office  [phone] Pager [phone]  On-Call/Text Page:      [email or web address]      password [email or web address]  If 7PM-7AM, please contact night-coverage [email or web address] Password [email or web address] 07/24/2013, 1:48 PM   LOS: 6 days

## 2013-07-24 NOTE — Progress Notes (Signed)
ANTIBIOTIC CONSULT NOTE - FOLLOW UP  Pharmacy Consult for zosyn Indication: pneumonia  No Known Allergies  Patient Measurements: Height: 5' 4.17" (163 cm) Weight: 348 lb (157.852 kg) IBW/kg (Calculated) : 55.1  Vital Signs: Temp: 97.8 F (36.6 C) (06/07 0400) Temp src: Oral (06/07 0400) BP: 121/62 mmHg (06/07 1000) Pulse Rate: 95 (06/07 1000) Intake/Output from previous day: 06/06 0701 - 06/07 0700 In: 530 [I.V.:480; IV Piggyback:50] Out: 1180 [Urine:1180] Intake/Output from this shift: Total I/O In: 40 [I.V.:40] Out: 250 [Urine:250]  Labs:  Recent Labs  07/22/13 0540 07/23/13 0530 07/24/13 0445  WBC 10.8* 12.5* 11.5*  HGB 12.4 13.4 13.0  PLT 123* 139* 144*  CREATININE 0.92 0.83 0.75   Estimated Creatinine Clearance: 150.5 ml/min (by C-G formula based on Cr of 0.75).  Assessment: 78 YOF s/p bariatric surgery on 5/28 here with shortness of breath, on Day #7 Zosyn empiric for pneumonia, vanc d/c'd. CXR pulm edema vs PNA. afeb. WBC sl up to 11.5. Cultures neg.   Vanc 6/1>>6/6 6/3: VT 10.3 @0500 . Vanc 1750 mg IV q12h -> incr to 2 gm q12h Zosyn 6/1>>  6/1 UCx - neg 6/1 BCx2 - ngtd  Scr stable, est. crcl > 100 ml/min  Goal of Therapy:  Resolution of infection  Plan:  - Continue zosyn 3.375g IV Q 8 hrs (4 hr infusion) - f/u cultures and renal funtion. - Please consider d/c zosyn soon?  Maryanna Shape, PharmD, BCPS  Clinical Pharmacist  Pager: [phone]   07/24/2013,10:28 AM

## 2013-07-24 NOTE — Progress Notes (Signed)
[facility] ADULT ICU REPLACEMENT PROTOCOL FOR AM LAB REPLACEMENT ONLY  The patient does not apply for the [facility] Adult ICU Electrolyte Replacment Protocol based on the criteria listed below:    Is urine output >/= 0.5 ml/kg/hr for the last 6 hours? no Patient's UOP is 0.2 ml/kg/hr  Abnormal electrolyte(s): K 2.9 If a panic level lab has been reported, has the CCM MD in charge been notified? yes.   Physician:  Patricia Nettle, MD  [facility] Clock 07/24/2013 6:49 AM

## 2013-07-24 NOTE — Progress Notes (Signed)
PULMONARY / CRITICAL CARE MEDICINE   Name: Melissa Erickson MRN: [id number] DOB: 02/04/1981    ADMISSION DATE:  07/18/2013   BRIEF PATIENT DESCRIPTION:  65 F with hx of morbid obesity, htn, OSA, asthma underwent laparoscopic vertical sleeve gastrectomy 5/28 in [place], [place]. Was discharged home the following day. Adm to PCCM service early AM 6/01 after presenting with acute hypoxic failure requiring intubation shortly after arrival.   LINES / TUBES: ETT 6/01 >> 6/5 L radial A line 6/01 >>  R IJ CVL 6/01 >>  NGT 6/2 > 07/23/13  CULTURES: Urine 6/01 >> neg Resp 6/01 >> not done Blood 6/01 >>   ANTIBIOTICS: Vanc 6/01 >> dc Zosyn 6/01 >>      SIGNIFICANT EVENTS / STUDIES:  6/01 Echocardiogram: LVEF 50-55%. Mild LVH. Grade 1 diastolic dysfxn 9/37 LE venous Dopplers: no evidence of DVT 07/20/13 - negative laparascopy and EGD. Per [facility] - From a surgery standpoint tube feeds can be started through the NG. If the NG inadvertently comes out, it would have to be replace by IR.  07/21/13: tolerating tube feeds. [facility] signed off 6/6 rehab consult 6/6 eating 6/7 tx to floor SUBJECTIVE:  Eating       VITAL SIGNS: Temp:  [97.8 F (36.6 C)-98.8 F (37.1 C)] 97.8 F (36.6 C) (06/07 0400) Pulse Rate:  [88-102] 94 (06/07 0800) Resp:  [0-40] 12 (06/07 0900) BP: (94-134)/(38-80) 116/67 mmHg (06/07 0800) SpO2:  [93 %-99 %] 98 % (06/07 0800) Weight:  [157.852 kg (348 lb)] 157.852 kg (348 lb) (06/07 0600)  HEMODYNAMICS:   VENTILATOR SETTINGS:   INTAKE / OUTPUT: Intake/Output     06/06 0701 - 06/07 0700 06/07 0701 - 06/08 0700   I.V. (mL/kg) 480 (3) 40 (0.3)   NG/GT     IV Piggyback 50    Total Intake(mL/kg) 530 (3.4) 40 (0.3)   Urine (mL/kg/hr) 1180 (0.3)    Total Output 1180     Net -650 +40         PHYSICAL EXAMINATION: General:  Morbidly obese, sitting in chair .Eating Neuro: follows commands. Denies complaints HEENT: [place]/AT, MMM Cardiovascular: distant HS, regular, no  M Lungs: no wheezes, distant BS Abdomen: Soft, 3 small surgical wounds with staples present, no purulence, BS diminished, eating Ext: warm, no edema  LABS:  PULMONARY  Recent Labs Lab 07/20/13 2129 07/21/13 0236 07/21/13 0450 07/22/13 0357 07/23/13 0450  PHART 7.484* 7.476* 7.431 7.420 7.495*  PCO2ART 50.0* 48.3* 54.3* 48.1* 36.8  PO2ART 79.0* 64.0* 68.1* 73.3* 67.4*  HCO3 36.9* 35.1* 34.8* 30.4* 28.2*  TCO2 38 36 36.3 31.8 29.4  O2SAT 95.0 91.0 92.3 94.1 93.6    CBC  Recent Labs Lab 07/22/13 0540 07/23/13 0530 07/24/13 0445  HGB 12.4 13.4 13.0  HCT 39.5 42.7 41.1  WBC 10.8* 12.5* 11.5*  PLT 123* 139* 144*    COAGULATION  Recent Labs Lab 07/18/13 0233 07/19/13 1022  INR 1.51* 1.45    CARDIAC    Recent Labs Lab 07/19/13 0855  TROPONINI 0.98*    Recent Labs Lab 07/18/13 0233 07/24/13 0445  PROBNP 3595.0* 75.7     CHEMISTRY  Recent Labs Lab 07/20/13 0500 07/21/13 0330 07/22/13 0540 07/23/13 0530 07/24/13 0445  NA 142 144 138 135* 141  K 4.3 3.4* 3.5* 3.1* 2.9*  CL 103 104 95* 93* 98  CO2 31 33* 29 26 27   GLUCOSE 124* 148* 151* 129* 132*  BUN 11 15 23 23 19   CREATININE 0.72 0.89 0.92  0.83 0.75  CALCIUM 7.8* 7.8* 9.1 9.3 9.1  MG 2.4 2.1 2.0 2.0 2.2  PHOS 2.3 1.4* 4.7* 3.0 2.6   Estimated Creatinine Clearance: 150.5 ml/min (by C-G formula based on Cr of 0.75).   LIVER  Recent Labs Lab 07/18/13 0233 07/19/13 0350 07/19/13 1022 07/20/13 0500  AST 348* 165*  --  60*  ALT 418* 483*  --  295*  ALKPHOS 70 55  --  53  BILITOT 1.1 0.6  --  0.5  PROT 7.9 6.5  --  6.3  ALBUMIN 3.3* 2.8*  --  2.6*  INR 1.51*  --  1.45  --      INFECTIOUS  Recent Labs Lab 07/18/13 0254 07/24/13 0450  LATICACIDVEN 7.29* 1.2     ENDOCRINE CBG (last 3)   Recent Labs  07/23/13 1949 07/24/13 0457 07/24/13 0750  GLUCAP 109* 125* 140*         IMAGING x48h  Dg Chest Port 1 View  07/24/2013   CLINICAL DATA:  Respiratory distress.   EXAM: PORTABLE CHEST - 1 VIEW  COMPARISON:  Chest x-ray 07/22/2013.  FINDINGS: Patient has been extubated. There is a right-sided internal jugular central venous catheter with tip terminating in the distal superior vena cava. Lung volumes are low. Multifocal irregular interstitial and airspace opacities are again noted throughout the lungs bilaterally, most pronounced in the perihilar regions and in the lung bases. Crowding of the pulmonary vasculature, accentuated by low lung volumes, without frank pulmonary edema. No definite pleural effusions. Heart size is within borderline enlarged. The patient is rotated to the left on today's exam, resulting in distortion of the mediastinal contours and reduced diagnostic sensitivity and specificity for mediastinal pathology.  IMPRESSION: 1. Support apparatus, as above. 2. Persistent multifocal interstitial and airspace opacities throughout the lungs bilaterally, favored to reflect residual multilobar pneumonia.   Electronically Signed   By: Vinnie Langton M.D.   On: 07/24/2013 08:02      ASSESSMENT / PLAN:  PULMONARY A: Acute resp failure(resolved) ARDS with Very asynchronous with vent   - Extubated 6/5 - ?OSA    P Monitor Aggressive pulm toilet O2 for pulse ox > 88%  CARDIOVASCULAR A:  Sinus tachycardia - reactive Troponin mildly positive.   - ongoing diuresis P:  - Trop elevated, cardiology feels is demand ischemia and would continue current measurement. - No anti-coag as not ACS. - Diastolic heart failure, diurese.  RENAL  A:   AKI, nonoliguric, improved.  P:   - Monitor BMET intermittently. - Monitor I/Os. - Correct electrolytes as indicated.   GASTROINTESTINAL A:   Severe obesity S/P bariatric surgery 5/28 To OR on 6/2, no leak from sleeve.   - swallowing ok. On bariatric diet. NG tube still in based on [facility] 07/20/13 post op recommendation but they signed off 07/21/13 P:   - SUP: IV PPI 0- change to PO. - DC NG tube (d/w  Dr Ninfa Linden of [facility])  HEMATOLOGIC A:   No issues P:  - DVT px: SQ heparin. - Monitor CBC intermittently. - Transfuse per usual ICU guidelines.  INFECTIOUS A:  Severe sepsis/SIRS P:   - Micro and abx as above.  ENDOCRINE CBG (last 3)   Recent Labs  07/23/13 1949 07/24/13 0457 07/24/13 0750  GLUCAP 109* 125* 140*     A:   Mild stress induced hyperglycemia without prior hx of DM P:   - Monitor glu on chem panels. - SSI   NEUROLOGIC A:   ICU  associated discomfort   P:   - PAD protocol; keep fent low dose prn for pain  -use haldol prn  .  TODAY'S SUMMARY:  Extubated and eating.  tx to floor and to rehab in future, [facility].  Richardson Landry Minor ACNP Maryanna Shape PCCM Pager [phone] till 3 pm If no answer page [phone] 07/24/2013, 9:50 AM   Attending:  I have seen and examined the patient with nurse practitioner/resident and agree with the note above.   [facility]  PCCM off  Roselie Awkward, MD [facility] Pager: [phone] Cell: [phone] If no response, call [phone]

## 2013-07-25 LAB — CBC WITH DIFFERENTIAL/PLATELET
Basophils Absolute: 0 10*3/uL (ref 0.0–0.1)
Basophils Relative: 0 % (ref 0–1)
Eosinophils Absolute: 0.2 10*3/uL (ref 0.0–0.7)
Eosinophils Relative: 2 % (ref 0–5)
HCT: 42.1 % (ref 36.0–46.0)
Hemoglobin: 13.4 g/dL (ref 12.0–15.0)
Lymphocytes Relative: 19 % (ref 12–46)
Lymphs Abs: 1.9 10*3/uL (ref 0.7–4.0)
MCH: 27.5 pg (ref 26.0–34.0)
MCHC: 31.8 g/dL (ref 30.0–36.0)
MCV: 86.3 fL (ref 78.0–100.0)
Monocytes Absolute: 0.9 10*3/uL (ref 0.1–1.0)
Monocytes Relative: 9 % (ref 3–12)
Neutro Abs: 6.8 10*3/uL (ref 1.7–7.7)
Neutrophils Relative %: 70 % (ref 43–77)
Platelets: 117 10*3/uL — ABNORMAL LOW (ref 150–400)
RBC: 4.88 MIL/uL (ref 3.87–5.11)
RDW: 14.4 % (ref 11.5–15.5)
WBC: 9.8 10*3/uL (ref 4.0–10.5)

## 2013-07-25 LAB — BASIC METABOLIC PANEL
BUN: 16 mg/dL (ref 6–23)
CO2: 23 mEq/L (ref 19–32)
Calcium: 9.1 mg/dL (ref 8.4–10.5)
Chloride: 100 mEq/L (ref 96–112)
Creatinine, Ser: 0.79 mg/dL (ref 0.50–1.10)
GFR calc Af Amer: >90 mL/min (ref >90)
GFR calc non Af Amer: >90 mL/min (ref >90)
Glucose, Bld: 117 mg/dL — ABNORMAL HIGH (ref 70–99)
Potassium: 3.1 mEq/L — ABNORMAL LOW (ref 3.7–5.3)
Sodium: 141 mEq/L (ref 137–147)

## 2013-07-25 LAB — GLUCOSE, CAPILLARY
Glucose-Capillary: 106 mg/dL — ABNORMAL HIGH (ref 70–99)
Glucose-Capillary: 119 mg/dL — ABNORMAL HIGH (ref 70–99)
Glucose-Capillary: 115 mg/dL — ABNORMAL HIGH (ref 70–99)
Glucose-Capillary: 124 mg/dL — ABNORMAL HIGH (ref 70–99)

## 2013-07-25 LAB — PHOSPHORUS: Phosphorus: 3.3 mg/dL (ref 2.3–4.6)

## 2013-07-25 LAB — MAGNESIUM: Magnesium: 2.2 mg/dL (ref 1.5–2.5)

## 2013-07-25 MED ORDER — POTASSIUM CHLORIDE 20 MEQ/15ML (10%) PO LIQD
40.0000 meq | Freq: Two times a day (BID) | ORAL | Status: DC
  Administered 2013-07-25: 40 meq via ORAL
  Filled 2013-07-25 (×3): qty 30

## 2013-07-25 MED ORDER — SODIUM CHLORIDE 0.9 % IJ SOLN
10.0000 mL | INTRAMUSCULAR | Status: DC | PRN
  Administered 2013-07-25 – 2013-07-27 (×9): 10 mL

## 2013-07-25 MED ORDER — POTASSIUM CHLORIDE CRYS ER 20 MEQ PO TBCR
40.0000 meq | EXTENDED_RELEASE_TABLET | Freq: Two times a day (BID) | ORAL | Status: DC
  Filled 2013-07-25: qty 2

## 2013-07-25 MED ORDER — HYDROCORTISONE 1 % EX CREA
TOPICAL_CREAM | Freq: Three times a day (TID) | CUTANEOUS | Status: DC | PRN
  Administered 2013-07-25: 22:00:00 via TOPICAL
  Administered 2013-07-25: 1 via TOPICAL
  Filled 2013-07-25: qty 28

## 2013-07-25 NOTE — Progress Notes (Signed)
NUTRITION FOLLOW-UP  DOCUMENTATION CODES Per approved criteria  -Morbid Obesity   INTERVENTION: Continue Bariatric Advanced diet.   NUTRITION DIAGNOSIS: Inadequate oral intake related to inability to eat as evidenced by NPO status; resolved.   No new nutrition dx at this time.  Goal: Tolerate post gastric bypass diet without difficulty. Met.  Monitor:  PO intake, labs, weight trend.  ASSESSMENT: 33 year old morbid obese female with PMH relevant for HTN, OSA, asthma. Underwent laparoscopic vertical sleeve gastrectomy on 07/14/13 Bal[facility] Ash). Pt required intubation Chest X ray with bilateral diffuse patchy infiltrates compatible with ARDS vs cardiogenic pulmonary edema.   Patient was extubated on 6/5. Diet has been advanced to bariatric advanced (pureed foods). Patient reports tolerating her diet very well, trying to consume enough fluids.  Height: Ht Readings from Last 1 Encounters:  07/18/13 5' 4.17" (1.63 m)    Weight: Wt Readings from Last 1 Encounters:  07/25/13 340 lb (154.223 kg)    Ideal Body Weight: 54.5 kg   BMI:  Body mass index is 58.05 kg/(m^2)., Morbid Obesity   Estimated Nutritional Needs: Kcals: 2200 Protein: >/= 109 grams Fluid: >/= 2.2 L  Skin: abdominal incision  Diet Order:  Bariatric Advanced   Intake/Output Summary (Last 24 hours) at 07/25/13 1331 Last data filed at 07/25/13 0900  Gross per 24 hour  Intake    390 ml  Output    325 ml  Net     65 ml    Last BM: 6/8   Labs:   Recent Labs Lab 07/23/13 0530 07/24/13 0445 07/24/13 1430 07/25/13 0545  NA 135* 141  --  141  K 3.1* 2.9* 3.3* 3.1*  CL 93* 98  --  100  CO2 26 27  --  23  BUN 23 19  --  16  CREATININE 0.83 0.75  --  0.79  CALCIUM 9.3 9.1  --  9.1  MG 2.0 2.2 2.3 2.2  PHOS 3.0 2.6  --  3.3  GLUCOSE 129* 132*  --  117*    CBG (last 3)   Recent Labs  07/24/13 2127 07/25/13 0033 07/25/13 0457  GLUCAP 138* 106* 119*   Lab Results  Component Value Date   TRIG 274* 07/19/2013    Scheduled Meds: . albuterol  2.5 mg Nebulization BID  . budesonide  0.25 mg Nebulization BID  . heparin  5,000 Units Subcutaneous 3 times per day  . ondansetron (ZOFRAN) IV  4 mg Intravenous 4 times per day  . pantoprazole  40 mg Oral Q1200    Continuous Infusions:  None   Molli Barrows, RD, LDN, [place] Pager [phone] After Hours Pager [phone]

## 2013-07-25 NOTE — Progress Notes (Signed)
Pt. Refuses CPAP at this time. Pt. Was made aware to call anytime during the night if she changed her mind & decided to wear CPAP.

## 2013-07-25 NOTE — Progress Notes (Signed)
[facility] TEAM 1 - Stepdown/ICU TEAM Progress Note  Melissa Erickson GEX:[id number] DOB: 1980/10/21 DOA: 07/18/2013 PCP: Leamon Arnt, MD  Admit HPI / Brief Narrative: 58 F W/ Hx of morbid obesity, HTN, OSA, and well-controlled Asthma, who underwent laparoscopic vertical sleeve gastrectomy 5/28 in [place] (Dr. Azucena Freed office: [phone], cell: [phone]). She was discharged home the following day, and subsequently admitted to the PCCM service early in the AM 6/01 after presenting with acute hypoxic failure requiring intubation shortly after arrival.  HPI/Subjective: Pt has no new complaints today.  She relates feeling dizzy and lightheaded when she attempts to ambulate to the bathroom.  She has an ongoing dry cough.  She denies sob or chest pain.  Her appetite is good.    Assessment/Plan:  Acute resp failure due to ARDS -Extubated 6/5  -Resolved   OSA  -states does not use CPAP at home secondary to disliking the mask -counseled patient on the sequela of not using her CPAP - patient agreed to use CPAP while in the hospital  NSTEMI Type 2 / Sinus tachycardia / Troponin mildly positive -Secondary to respiratory failure  -Diagnosed with diastolic CHF see echocardiogram   Diastolic CHF -well compensated at present   Hypokalemia  -replace and follow - Mg is normal   AKI, nonoliguric -BUN/creatinine have now normalized  -Continue to monitor renal status  Severe obesity  -S/P bariatric surgery 5/28 in [place] -6/2 EGD showed, no leak from sleeve -Continue on bariatric diet  Severe sepsis / SIRS  -No source of infection - DC antibiotics  Code Status: FULL Family Communication: spoke w/ pt and mother at bedside  Disposition Plan: CIR vs SNF  Consultants: [name] (surgery) Dr. Coralie Keens (surgery) Dr.Wesam Nelda Marseille (PCCM.) Dr. Candee Furbish (cardiology)   Procedure/Significant Events: 6/01 Echocardiogram: LVEF 50-55%. Mild LVH. Grade 1 diastolic  dysfxn  5/63 LE venous Dopplers: no evidence of DVT  6/2 EGD; no evidence of leak from sleeve gastrectomy 07/20/13 - negative laparascopy and EGD.  07/21/13: tolerating tube feeds. CCS signed off  Antibiotics: Vanc 6/01 >> 6/5  Zosyn 6/01 >> 6/7  DVT prophylaxis: SQ Heparin  Objective: VITAL SIGNS: Blood pressure 131/62, pulse 97, temperature 98.7 F (37.1 C), temperature source Oral, resp. rate 26, height 5' 4.17" (1.63 m), weight 154.223 kg (340 lb), SpO2 96.00%.  Intake/Output Summary (Last 24 hours) at 07/25/13 1731 Last data filed at 07/25/13 1300  Gross per 24 hour  Intake    460 ml  Output    175 ml  Net    285 ml   Exam: General: No acute respiratory distress Lungs: Clear to auscultation bilaterally without wheezes or crackles Cardiovascular: Regular rate and rhythm without murmur gallop or rub normal S1 and S2 Abdomen: Morbidly obese, nontender, soft, bowel sounds positive, no rebound, no ascites, no appreciable mass Extremities: No significant cyanosis, clubbing, or edema bilateral lower extremities  Data Reviewed: Basic Metabolic Panel:  Recent Labs Lab 07/21/13 0330 07/22/13 0540 07/23/13 0530 07/24/13 0445 07/24/13 1430 07/25/13 0545  NA 144 138 135* 141  --  141  K 3.4* 3.5* 3.1* 2.9* 3.3* 3.1*  CL 104 95* 93* 98  --  100  CO2 33* 29 26 27   --  23  GLUCOSE 148* 151* 129* 132*  --  117*  BUN 15 23 23 19   --  16  CREATININE 0.89 0.92 0.83 0.75  --  0.79  CALCIUM 7.8* 9.1 9.3 9.1  --  9.1  MG 2.1 2.0  2.0 2.2 2.3 2.2  PHOS 1.4* 4.7* 3.0 2.6  --  3.3   Liver Function Tests:  Recent Labs Lab 07/19/13 0350 07/20/13 0500  AST 165* 60*  ALT 483* 295*  ALKPHOS 55 53  BILITOT 0.6 0.5  PROT 6.5 6.3  ALBUMIN 2.8* 2.6*   CBC:  Recent Labs Lab 07/21/13 0330 07/22/13 0540 07/23/13 0530 07/24/13 0445 07/25/13 0545  WBC 9.9 10.8* 12.5* 11.5* 9.8  NEUTROABS  --   --   --  8.4* 6.8  HGB 10.6* 12.4 13.4 13.0 13.4  HCT 33.7* 39.5 42.7 41.1 42.1  MCV  89.4 87.8 85.7 86.0 86.3  PLT 129* 123* 139* 144* 117*   Cardiac Enzymes:  Recent Labs Lab 07/19/13 0855  TROPONINI 0.98*   BNP (last 3 results)  Recent Labs  03/06/13 2245 07/18/13 0233 07/24/13 0445  PROBNP 31.1 3595.0* 75.7   CBG:  Recent Labs Lab 07/24/13 1223 07/24/13 1641 07/24/13 2127 07/25/13 0033 07/25/13 0457  GLUCAP 124* 102* 138* 106* 119*    Recent Results (from the past 240 hour(s))  CULTURE, BLOOD (ROUTINE X 2)     Status: None   Collection Time    07/18/13  3:30 AM      Result Value Ref Range Status   Specimen Description BLOOD LEFT ANTECUBITAL   Final   Special Requests BOTTLES DRAWN AEROBIC AND ANAEROBIC 10CC EA   Final   Culture  Setup Time     Final   Value: 07/18/2013 09:10     Performed at [facility]   Culture     Final   Value: NO GROWTH 5 DAYS     Performed at [facility]   Report Status 07/24/2013 FINAL   Final  MRSA PCR SCREENING     Status: None   Collection Time    07/18/13  7:58 AM      Result Value Ref Range Status   MRSA by PCR NEGATIVE  NEGATIVE Final   Comment:            The GeneXpert MRSA Assay (FDA     approved for NASAL specimens     only), is one component of a     comprehensive MRSA colonization     surveillance program. It is not     intended to diagnose MRSA     infection nor to guide or     monitor treatment for     MRSA infections.  CULTURE, BLOOD (ROUTINE X 2)     Status: None   Collection Time    07/18/13  9:05 AM      Result Value Ref Range Status   Specimen Description BLOOD LEFT HAND   Final   Special Requests BOTTLES DRAWN AEROBIC ONLY 4CC   Final   Culture  Setup Time     Final   Value: 07/18/2013 12:34     Performed at [facility]   Culture     Final   Value: NO GROWTH 5 DAYS     Performed at [facility]   Report Status 07/24/2013 FINAL   Final  URINE CULTURE     Status: None   Collection Time    07/18/13 10:29 AM      Result Value Ref Range Status    Specimen Description URINE, CATHETERIZED   Final   Special Requests NONE   Final   Culture  Setup Time     Final   Value: 07/18/2013 16:33  Performed at [facility]     Final   Value: NO GROWTH     Performed at [facility]   Culture     Final   Value: NO GROWTH     Performed at [facility]   Report Status 07/19/2013 FINAL   Final     Studies:  Recent x-ray studies have been reviewed in detail by the Attending Physician  Scheduled Meds:  Scheduled Meds: . albuterol  2.5 mg Nebulization BID  . budesonide  0.25 mg Nebulization BID  . heparin  5,000 Units Subcutaneous 3 times per day  . ondansetron (ZOFRAN) IV  4 mg Intravenous 4 times per day  . pantoprazole  40 mg Oral Q1200    Time spent on care of this patient: 35 mins  Cherene Altes, MD [facility] For Consults/Admissions - Flow Manager - [phone] Office  [phone] Pager [phone]  On-Call/Text Page:      [email or web address]      password [facility]  07/25/2013, 5:31 PM   LOS: 7 days

## 2013-07-25 NOTE — Progress Notes (Signed)
Pt transferred to 3E06 with all belongings including phone and glasses. BSC and walker was taken over also. VSS.

## 2013-07-25 NOTE — Progress Notes (Signed)
Called report to Ander Purpura, RN on 3E. Will transfer after pt eats lunch.

## 2013-07-26 DIAGNOSIS — R5381 Other malaise: Secondary | ICD-10-CM

## 2013-07-26 HISTORY — PX: LAPAROSCOPIC GASTRIC SLEEVE RESECTION: SHX5895

## 2013-07-26 LAB — BASIC METABOLIC PANEL
BUN: 13 mg/dL (ref 6–23)
CHLORIDE: 97 meq/L (ref 96–112)
CO2: 25 mEq/L (ref 19–32)
Calcium: 9 mg/dL (ref 8.4–10.5)
Creatinine, Ser: 0.78 mg/dL (ref 0.50–1.10)
GFR calc Af Amer: >90 mL/min (ref >90)
GFR calc non Af Amer: >90 mL/min (ref >90)
GLUCOSE: 110 mg/dL — AB (ref 70–99)
Potassium: 2.8 mEq/L — CL (ref 3.7–5.3)
Sodium: 140 mEq/L (ref 137–147)

## 2013-07-26 LAB — TROPONIN I: Troponin I: <0.3 ng/mL (ref <0.30)

## 2013-07-26 MED ORDER — UNJURY CHOCOLATE CLASSIC POWDER
2.0000 [oz_av] | Freq: Four times a day (QID) | ORAL | Status: DC
  Filled 2013-07-26 (×6): qty 27

## 2013-07-26 MED ORDER — ADULT MULTIVITAMIN W/MINERALS CH
1.0000 | ORAL_TABLET | Freq: Two times a day (BID) | ORAL | Status: DC
  Administered 2013-07-26 – 2013-07-27 (×2): 1 via ORAL
  Filled 2013-07-26 (×3): qty 1

## 2013-07-26 MED ORDER — POTASSIUM CHLORIDE 10 MEQ/50ML IV SOLN
10.0000 meq | INTRAVENOUS | Status: AC
  Administered 2013-07-26 (×5): 10 meq via INTRAVENOUS
  Filled 2013-07-26 (×5): qty 50

## 2013-07-26 MED ORDER — POTASSIUM CHLORIDE 20 MEQ/15ML (10%) PO LIQD
40.0000 meq | Freq: Every day | ORAL | Status: DC
  Filled 2013-07-26: qty 30

## 2013-07-26 NOTE — Progress Notes (Signed)
Physical Therapy Treatment Patient Details Name: Melissa Erickson MRN: [id number] DOB: 03/02/1980 Today's Date: 07/26/2013    History of Present Illness 65 F with hx of morbid obesity, htn, OSA, asthma underwent laparoscopic vertical sleeve gastrectomy 5/28 in [place], [place]. Was discharged home the following day. Adm to PCCM service early AM 6/01 after presenting with acute hypoxic failure requiring intubation shortly after arrival. VDRF 6/1-6/5    PT Comments    Pt progressing towards physical therapy goals. Continues to be limited by decreased tolerance for functional activity. Was able to ambulate an increased distance this session with chair follow and seated rest breaks. At end of session pt expresses her desire to enter CIR, and feel this continues to be a good option at d/c for pt to regain independence and tolerance for functional activity so she can return home and care for her daughter. Will continue to progress per POC.   Follow Up Recommendations  CIR     Equipment Recommendations  Rolling walker with 5" wheels (Bariatric)    Recommendations for Other Services       Precautions / Restrictions Precautions Precautions: Fall Restrictions Weight Bearing Restrictions: No    Mobility  Bed Mobility Overal bed mobility: Needs Assistance Bed Mobility: Supine to Sit;Sit to Supine     Supine to sit: Min assist Sit to supine: Min assist   General bed mobility comments: Assist for trunk elevation when coming to sitting position, and assist for LE elevation into bed when transitioning to supine.   Transfers Overall transfer level: Needs assistance Equipment used: Rolling walker (2 wheeled) (bariatric) Transfers: Sit to/from Stand Sit to Stand: Min guard         General transfer comment: VC's for hand placement on seated surface for safety.   Ambulation/Gait Ambulation/Gait assistance: Min guard Ambulation Distance (Feet): 175 Feet Assistive device: Rolling walker (2  wheeled) Gait Pattern/deviations: Step-through pattern;Decreased stride length;Wide base of support Gait velocity: Decreased Gait velocity interpretation: Below normal speed for age/gender General Gait Details: 1 seated rest break required. Pt very fatigued at end of gait training, and was slightly SOB. Pt states she did not have a limit for distance when ambulating prior to admission.   Stairs            Wheelchair Mobility    Modified Rankin (Stroke Patients Only)       Balance Overall balance assessment: Needs assistance Sitting-balance support: Feet supported;No upper extremity supported Sitting balance-Leahy Scale: Good     Standing balance support: No upper extremity supported Standing balance-Leahy Scale: Fair                      Cognition Arousal/Alertness: Awake/alert Behavior During Therapy: WFL for tasks assessed/performed Overall Cognitive Status: Within Functional Limits for tasks assessed                      Exercises      General Comments        Pertinent Vitals/Pain Vitals stable throughout session.     Home Living                      Prior Function            PT Goals (current goals can now be found in the care plan section) Acute Rehab PT Goals Patient Stated Goal: to return to retail therapy PT Goal Formulation: With patient Time For Goal Achievement: 08/06/13 Potential to Achieve Goals:  Good Progress towards PT goals: Progressing toward goals    Frequency  Min 3X/week    PT Plan Current plan remains appropriate    Co-evaluation             End of Session Equipment Utilized During Treatment: Gait belt Activity Tolerance: Patient limited by fatigue Patient left: in bed;with call bell/phone within reach     Time: 1440-1510 PT Time Calculation (min): 30 min  Charges:  $Gait Training: 8-22 mins $Therapeutic Activity: 8-22 mins                    G Codes:      Jolyn Lent 08/22/13,  4:21 PM  Jolyn Lent, PT, DPT Acute Rehabilitation Services Pager: [phone]

## 2013-07-26 NOTE — Progress Notes (Signed)
Inpatient Rehabilitation  I met with Melissa Erickson at the bedside and discussed her post acute rehab options .  She is very interested in coming to CIR for her rehab and requests that I submit for insurance authorization.    I will do so this afternoon and will advise when I receive a decision.  Please call if questions.  [facility] Admissions Coordinator Cell [phone] Office [phone]

## 2013-07-26 NOTE — Progress Notes (Signed)
Progress Note  Melissa Erickson ZDG:[id number] DOB: [birth year] DOA: 07/18/2013 PCP: Leamon Arnt, MD  Admit HPI / Brief Narrative: 51 F W/ Hx of morbid obesity, HTN, OSA, and well-controlled Asthma, who underwent laparoscopic vertical sleeve gastrectomy 5/28 in [place] (Dr. Azucena Freed office: [phone], cell: [phone]). She was discharged home the following day, and subsequently admitted to the PCCM service early in the AM 6/01 after presenting with acute hypoxic failure requiring intubation shortly after arrival.  Assessment/Plan: Acute resp failure due to ARDS -Extubated 6/5 and doing ok since then. -Resolved   OSA  -states does not use CPAP at home secondary to disliking the mask -counseled patient on the sequela of not using her CPAP - patient agreed to use CPAP while in the hospital  NSTEMI Type 2 / Sinus tachycardia / Troponin mildly positive -Secondary to respiratory failure  -Diagnosed with diastolic CHF see echocardiogram   Diastolic CHF -well compensated at present  -will need to follow low sodium diet  -no lasix on daily basis needed at this point.  Hypokalemia  -replace and follow trend - Mg is normal   AKI, nonoliguric -BUN/creatinine have now normalized  -Continue to monitor renal status  Severe obesity  -S/P bariatric surgery 5/28 in [place] -6/2 EGD showed, no leak from sleeve -Continue on bariatric diet -bariatric nurse and dietician consulted; will follow rec's  Severe sepsis / SIRS  -No source of infection identified. -antibiotics discontinue and patient remains afebrile and with normal WBC's -will monitor   Code Status: FULL Family Communication: spoke w/ pt and mother at bedside  Disposition Plan: CIR vs SNF  Consultants: [name] (surgery) Dr. Coralie Keens (surgery) Dr.Wesam Nelda Marseille (PCCM.) Dr. Candee Furbish (cardiology)   Procedure/Significant Events: 6/01 Echocardiogram: LVEF 50-55%. Mild LVH. Grade 1 diastolic dysfxn   6/60 LE venous Dopplers: no evidence of DVT  6/2 EGD; no evidence of leak from sleeve gastrectomy 07/20/13 - negative laparascopy and EGD.  07/21/13: tolerating tube feeds. CCS signed off  Antibiotics: Vanc 6/01 >> 6/5  Zosyn 6/01 >> 6/7  DVT prophylaxis: SQ Heparin  Objective: Feeling better. Denies CP and endorses breathing is significantly better. VITAL SIGNS: Blood pressure 128/41, pulse 94, temperature 97.9 F (36.6 C), temperature source Oral, resp. rate 20, height 5' 4.17" (1.63 m), weight 165.79 kg (365 lb 8 oz), SpO2 99.00%.  Intake/Output Summary (Last 24 hours) at 07/26/13 1436 Last data filed at 07/25/13 2146  Gross per 24 hour  Intake    120 ml  Output     75 ml  Net     45 ml   Exam: General: No acute respiratory distress; scattered episodes of dry cough on exam Lungs: Clear to auscultation bilaterally without wheezes or crackles Cardiovascular: Regular rate and rhythm without murmur gallop or rub normal S1 and S2 Abdomen: Morbidly obese, nontender, soft, bowel sounds positive, no rebound, no ascites, no appreciable mass Extremities: No significant cyanosis, clubbing, or edema bilateral lower extremities  Data Reviewed: Basic Metabolic Panel:  Recent Labs Lab 07/21/13 0330 07/22/13 0540 07/23/13 0530 07/24/13 0445 07/24/13 1430 07/25/13 0545 07/26/13 0529  NA 144 138 135* 141  --  141 140  K 3.4* 3.5* 3.1* 2.9* 3.3* 3.1* 2.8*  CL 104 95* 93* 98  --  100 97  CO2 33* 29 26 27   --  23 25  GLUCOSE 148* 151* 129* 132*  --  117* 110*  BUN 15 23 23 19   --  16 13  CREATININE 0.89 0.92 0.83 0.75  --  0.79 0.78  CALCIUM 7.8* 9.1 9.3 9.1  --  9.1 9.0  MG 2.1 2.0 2.0 2.2 2.3 2.2  --   PHOS 1.4* 4.7* 3.0 2.6  --  3.3  --    Liver Function Tests:  Recent Labs Lab 07/20/13 0500  AST 60*  ALT 295*  ALKPHOS 53  BILITOT 0.5  PROT 6.3  ALBUMIN 2.6*   CBC:  Recent Labs Lab 07/21/13 0330 07/22/13 0540 07/23/13 0530 07/24/13 0445 07/25/13 0545  WBC  9.9 10.8* 12.5* 11.5* 9.8  NEUTROABS  --   --   --  8.4* 6.8  HGB 10.6* 12.4 13.4 13.0 13.4  HCT 33.7* 39.5 42.7 41.1 42.1  MCV 89.4 87.8 85.7 86.0 86.3  PLT 129* 123* 139* 144* 117*   Cardiac Enzymes:  Recent Labs Lab 07/26/13 0529  TROPONINI <0.30   BNP (last 3 results)  Recent Labs  03/06/13 2245 07/18/13 0233 07/24/13 0445  PROBNP 31.1 3595.0* 75.7   CBG:  Recent Labs Lab 07/24/13 2127 07/25/13 0033 07/25/13 0457 07/25/13 0807 07/25/13 1153  GLUCAP 138* 106* 119* 124* 115*    Recent Results (from the past 240 hour(s))  CULTURE, BLOOD (ROUTINE X 2)     Status: None   Collection Time    07/18/13  3:30 AM      Result Value Ref Range Status   Specimen Description BLOOD LEFT ANTECUBITAL   Final   Special Requests BOTTLES DRAWN AEROBIC AND ANAEROBIC 10CC EA   Final   Culture  Setup Time     Final   Value: 07/18/2013 09:10     Performed at [facility]   Culture     Final   Value: NO GROWTH 5 DAYS     Performed at [facility]   Report Status 07/24/2013 FINAL   Final  MRSA PCR SCREENING     Status: None   Collection Time    07/18/13  7:58 AM      Result Value Ref Range Status   MRSA by PCR NEGATIVE  NEGATIVE Final   Comment:            The GeneXpert MRSA Assay (FDA     approved for NASAL specimens     only), is one component of a     comprehensive MRSA colonization     surveillance program. It is not     intended to diagnose MRSA     infection nor to guide or     monitor treatment for     MRSA infections.  CULTURE, BLOOD (ROUTINE X 2)     Status: None   Collection Time    07/18/13  9:05 AM      Result Value Ref Range Status   Specimen Description BLOOD LEFT HAND   Final   Special Requests BOTTLES DRAWN AEROBIC ONLY 4CC   Final   Culture  Setup Time     Final   Value: 07/18/2013 12:34     Performed at [facility]   Culture     Final   Value: NO GROWTH 5 DAYS     Performed at [facility]   Report Status  07/24/2013 FINAL   Final  URINE CULTURE     Status: None   Collection Time    07/18/13 10:29 AM      Result Value Ref Range Status   Specimen Description URINE, CATHETERIZED   Final   Special Requests NONE   Final  Culture  Setup Time     Final   Value: 07/18/2013 16:33     Performed at [facility]     Final   Value: NO GROWTH     Performed at [facility]   Culture     Final   Value: NO GROWTH     Performed at [facility]   Report Status 07/19/2013 FINAL   Final     Studies:  Recent x-ray studies have been reviewed in detail by the Attending Physician  Scheduled Meds:  Scheduled Meds: . albuterol  2.5 mg Nebulization BID  . budesonide  0.25 mg Nebulization BID  . heparin  5,000 Units Subcutaneous 3 times per day  . ondansetron (ZOFRAN) IV  4 mg Intravenous 4 times per day  . pantoprazole  40 mg Oral Q1200  . potassium chloride  10 mEq Intravenous Q1 Hr x 5    Time spent on care of this patient: < 30 mins  Barton Dubois [phone]  07/26/2013, 2:36 PM   LOS: 8 days

## 2013-07-26 NOTE — Evaluation (Signed)
Occupational Therapy Evaluation Patient Details Name: Melissa Erickson MRN: [id number] DOB: September 12, 1980 Today's Date: 07/26/2013    History of Present Illness 59 F with hx of morbid obesity, htn, OSA, asthma underwent laparoscopic vertical sleeve gastrectomy 5/28 in [place], [place]. Was discharged home the following day. Adm to PCCM service early AM 6/01 after presenting with acute hypoxic failure requiring intubation shortly after arrival. VDRF 6/1-6/5   Clinical Impression   PT admitted with VDRF with prolonged intubation. Pt currently with functional limitiations due to the deficits listed below (see OT problem list). PTA living independent and working. Pt will benefit from skilled OT to increase their independence and safety with adls and balance to allow discharge CIR. Pt could reach a MOD I level in a quick intense therapy environment. Highly recommend CIR admission for fast recovery and return to independence. 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      Follow Up Recommendations  CIR    Equipment Recommendations  Other (comment) (defer to CIR)    Recommendations for Other Services Rehab consult     Precautions / Restrictions Precautions Precautions: Fall      Mobility Bed Mobility Overal bed mobility: Needs Assistance Bed Mobility: Supine to Sit     Supine to sit: Min guard;HOB elevated     General bed mobility comments: needed incr time, multiple attempts to sequence task, rest break once BIL LE off EOB. Pt pulling up on bed rail with RT UE  Transfers Overall transfer level: Needs assistance Equipment used: Rolling walker (2 wheeled) (bariatric RW) Transfers: Sit to/from Stand Sit to Stand: Min assist;+2 physical assistance Stand pivot transfers: Mod assist       General transfer comment: needed (A) moving RW    Balance Overall balance assessment: Needs assistance   Sitting balance-Leahy Scale: Good       Standing balance-Leahy Scale: Fair                               ADL Overall ADL's : Needs assistance/impaired     Grooming: Brushing hair;Supervision/safety;Sitting Grooming Details (indicate cue type and reason): pt able to untie hair tie from hair ( take down pony tail ) with bil UE   Upper Body Bathing Details (indicate cue type and reason): reports total (A ) from tech just prior to OT arrival Lower Body Bathing: Total assistance;Bed level           Toilet Transfer: Minimal assistance;+2 for physical assistance;Stand-pivot;Ambulation;RW           Functional mobility during ADLs: Minimal assistance;Rolling walker;+2 for physical assistance General ADL Comments: PT completed bed mobility adn sitting EOB. pt tolerated static standing ~2 minutes and reports dizziness. pt stand pivot to chair mod (A) for RW (A). pt with uncontrolled descend to chair. Pt requires use of BIL UE for sit <>Stand     Vision                     Perception     Praxis      Pertinent Vitals/Pain Pain reported in Rt shoulder with shoulder flexion as numbness burning in 4th / 5th digits ( ulnar nerve distribution) No pain reported anywhere when not moving RT UE     Hand Dominance Right (writes with LT UE but all other activities RTUE)   Extremity/Trunk Assessment Upper Extremity Assessment Upper Extremity Assessment: RUE deficits/detail RUE Deficits / Details: describes numbness of a ulnar  nerve distribution, pt with decr fine motor movements RUE Sensation: decreased light touch RUE Coordination: decreased fine motor   Lower Extremity Assessment Lower Extremity Assessment: Defer to PT evaluation   Cervical / Trunk Assessment Cervical / Trunk Assessment: Normal   Communication Communication Communication: No difficulties   Cognition Arousal/Alertness: Awake/alert Behavior During Therapy: WFL for tasks assessed/performed Overall Cognitive Status: Within Functional Limits for tasks assessed                      General Comments       Exercises       Shoulder Instructions      Home Living Family/patient expects to be discharged to:: Private residence Living Arrangements: Children Available Help at Discharge: Family;Available PRN/intermittently Type of Home: Apartment Home Access: Level entry     Home Layout: One level     Bathroom Shower/Tub: Tub/shower unit;Curtain   Bathroom Toilet: Handicapped height     Home Equipment: None          Prior Functioning/Environment Level of Independence: Independent        Comments: Pt has an 35yo dgtr and states god-mother can assist her some at DC. 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    OT Diagnosis: Generalized weakness   OT Problem List: Decreased strength;Decreased activity tolerance;Impaired balance (sitting and/or standing);Decreased safety awareness;Decreased knowledge of use of DME or AE;Obesity;Impaired UE functional use   OT Treatment/Interventions: Self-care/ADL training;Therapeutic exercise;Neuromuscular education;DME and/or AE instruction;Therapeutic activities;Patient/family education;Balance training    OT Goals(Current goals can be found in the care plan section) Acute Rehab OT Goals Patient Stated Goal: to return to retail therapy OT Goal Formulation: With patient Time For Goal Achievement: 08/09/13 Potential to Achieve Goals: Good  OT Frequency: Min 2X/week   Barriers to D/C:            Co-evaluation              End of Session Nurse Communication: Mobility status;Precautions  Activity Tolerance: Patient tolerated treatment well Patient left: in chair;with call bell/phone within reach   Time: 1012-1030 OT Time Calculation (min): 18 min Charges:  OT General Charges $OT Visit: 1 Procedure OT Evaluation $Initial OT Evaluation Tier I: 1 Procedure OT Treatments $Self Care/Home Management : 8-22 mins G-Codes:    Peri Maris Aug 23, 2013, 11:16 AM Pager: [phone]

## 2013-07-26 NOTE — Progress Notes (Signed)
UR completed Almir Botts K. Ezekiel Menzer, RN, BSN, [place], CCM  07/26/2013 4:07 PM

## 2013-07-26 NOTE — Progress Notes (Signed)
CSW met with patient this afternoon to discuss possible SNF placement as back up should she not be able to admit to CIR.  Patient declined SNF option stating that she would rather return home with Home Health if unable to go to CIR. She stated that her mother and godmother will provide home support along with other family members. Patient currently parents a 35 year old daughter. She was able to fully understand about the SNF option as her mother has been in SNF rehab on 3 different occasions.  Patient was very appreciative of CSW's visit and remains hopeful for CIR. CSW told patient that should she change her mind about back up plan- to please notify nursing and I will follow up.  Otherwise- will sign off for now.  Above discussed with Crystal Hutcherson- RNCM.  Donna T. Crowder, LCSWA  [phone]    

## 2013-07-26 NOTE — Progress Notes (Signed)
Patient continues to refuse the CPAP machine at night time. There is no CPAP in patient's room. Patient is aware to call at any time if she does change her mind about wearing the machine. RT will continue to assist as needed.

## 2013-07-26 NOTE — Progress Notes (Signed)
NUTRITION FOLLOW-UP  DOCUMENTATION CODES Per approved criteria  -Morbid Obesity   INTERVENTION:  Continue Bariatric Advanced diet  2 Multivitamins daily   Recommend Unjury nutrition supplement QID  Recommend to supplement with 1500 mg calcium citrate with vitamin D per day  Recommend to supplement with vitamin B12    NUTRITION DIAGNOSIS: No new nutrition dx at this time.  Goal: Tolerate post gastric bypass diet without difficulty. Met.  Monitor:  PO intake, labs, weight trend.  ASSESSMENT: 33 year old morbid obese female with PMH relevant for HTN, OSA, asthma. Underwent laparoscopic vertical sleeve gastrectomy on 07/14/13 [facility]). Pt required intubation Chest X ray with bilateral diffuse patchy infiltrates compatible with ARDS vs cardiogenic pulmonary edema.   Patient was extubated on 6/5. Diet has been advanced to bariatric advanced (pureed foods). Patient reports tolerating her diet but only eating 50%. Pt stated that she is trying to consume fluids.  Provided pt with handouts from the [facility] for post-bariatric surgery patients 10-14 days up to 2 months. Provided information on high protein foods (to get at least 60 grams each day) and to be sure to consume at least 64 ounces of fluid each day. Explained to pt the vitamins she should be taking and patient was already aware of these. Encouraged pt to chew her foods thoroughly.   Gave contact into for pt to follow up with [facility] for when pt is discharged.   Height: Ht Readings from Last 1 Encounters:  07/18/13 5' 4.17" (1.63 m)    Weight: Wt Readings from Last 1 Encounters:  07/26/13 365 lb 8 oz (165.79 kg)    Ideal Body Weight: 54.5 kg   BMI:  Body mass index is 62.4 kg/(m^2)., Morbid Obesity   Estimated Nutritional Needs: Kcals: 2200 Protein: >/= 109 grams Fluid: >/= 2.2 L  Skin: abdominal incision  Diet Order:  Bariatric  Advanced   Intake/Output Summary (Last 24 hours) at 07/26/13 1547 Last data filed at 07/25/13 2146  Gross per 24 hour  Intake    120 ml  Output     75 ml  Net     45 ml    Last BM: 6/8   Labs:   Recent Labs Lab 07/23/13 0530 07/24/13 0445 07/24/13 1430 07/25/13 0545 07/26/13 0529  NA 135* 141  --  141 140  K 3.1* 2.9* 3.3* 3.1* 2.8*  CL 93* 98  --  100 97  CO2 26 27  --  23 25  BUN 23 19  --  16 13  CREATININE 0.83 0.75  --  0.79 0.78  CALCIUM 9.3 9.1  --  9.1 9.0  MG 2.0 2.2 2.3 2.2  --   PHOS 3.0 2.6  --  3.3  --   GLUCOSE 129* 132*  --  117* 110*    CBG (last 3)   Recent Labs  07/25/13 0457 07/25/13 0807 07/25/13 1153  GLUCAP 119* 124* 115*   Lab Results  Component Value Date   TRIG 274* 07/19/2013    Scheduled Meds: . albuterol  2.5 mg Nebulization BID  . budesonide  0.25 mg Nebulization BID  . heparin  5,000 Units Subcutaneous 3 times per day  . ondansetron (ZOFRAN) IV  4 mg Intravenous 4 times per day  . pantoprazole  40 mg Oral Q1200  . potassium chloride  10 mEq Intravenous Q1 Hr x 5    Continuous Infusions:  None  Carrolyn Leigh, BS Dietetic Intern  Pager: [phone]  I agree with student dietitian note; appropriate revisions have been made.  Molli Barrows, RD, LDN, [place] Pager# [phone] After Hours Pager# [phone]

## 2013-07-26 NOTE — Consult Note (Signed)
Physical Medicine and Rehabilitation Consult Reason for Consult: Deconditioning respiratory failure after laparoscopic gastrectomy Referring Physician: [facility]   HPI: Melissa Erickson is a 33 y.o. right-handed female with history of morbid obesity, hypertension, OSA, asthma. Patient independent prior to admission. Patient recently underwent laparoscopic vertical sleeve gastrectomy 07/14/2013 in [facility]. She was discharged to home the following day and subsequently admitted to the PCCM. service 07/18/2013 after presenting with acute hypoxic failure requiring intubation shortly after arrival. CT abdomen pelvis and chest showed no pulmonary embolus. Multilobar groundglass and consolidation indicated of edema versus pneumonia. Placed on broad-spectrum antibiotics. Venous Doppler studies lower extremities negative for DVT. Followup per critical care pulmonary services. Echocardiogram with ejection fraction 55% mild LVH grade 1 diastolic dysfunction. Patient extubated 07/22/2013. Mild elevation troponin followup cardiology services and felt to be demand ischemia and monitored. Maintained on subcutaneous heparin for DVT prophylaxis. Physical therapy evaluation completed 07/23/2013 with recommendations for physical medicine rehabilitation consult.  Feels weak in legs, no numbness, no abd pain  Review of Systems  Respiratory: Positive for shortness of breath.   Gastrointestinal: Positive for constipation.  Neurological: Positive for headaches.  All other systems reviewed and are negative.  Past Medical History  Diagnosis Date  . Polycystic ovarian syndrome   . Asthma   . Migraine    Past Surgical History  Procedure Laterality Date  . Cholecystectomy    . Cesarean section    . Laparoscopic gastric sleeve resection    . Laparoscopy N/A 07/19/2013    Procedure: LAPAROSCOPY DIAGNOSTIC;  Surgeon: Harl Bowie, MD;  Location: [place];  Service: General;  Laterality: N/A;  .  Esophagogastroduodenoscopy N/A 07/19/2013    Procedure: ESOPHAGOGASTRODUODENOSCOPY (EGD);  Surgeon: Harl Bowie, MD;  Location: [facility];  Service: General;  Laterality: N/A;   History reviewed. No pertinent family history. Social History:  reports that she has never smoked. She does not have any smokeless tobacco history on file. She reports that she does not drink alcohol or use illicit drugs. Allergies: No Known Allergies Medications Prior to Admission  Medication Sig Dispense Refill  . ADVAIR DISKUS 100-50 MCG/DOSE AEPB Inhale 2 puffs into the lungs 2 (two) times daily.       Marland Kitchen albuterol (PROVENTIL HFA;VENTOLIN HFA) 108 (90 BASE) MCG/ACT inhaler Inhale 2 puffs into the lungs every 6 (six) hours as needed for wheezing.        Home: Home Living Family/patient expects to be discharged to:: Private residence Living Arrangements: Children Available Help at Discharge: Family;Available PRN/intermittently Type of Home: Apartment Home Access: Level entry Home Layout: One level Home Equipment: None  Functional History: Prior Function Level of Independence: Independent Comments: Pt has an 35yo dgtr and states god-mother can assist her some at DC. 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 Functional Status:  Mobility: Bed Mobility Overal bed mobility: Needs Assistance;+2 for physical assistance Bed Mobility: Rolling;Sidelying to Sit Rolling: Mod assist;+2 for physical assistance Sidelying to sit: Mod assist;+2 for physical assistance General bed mobility comments: cues for sequence, use of rail and assist to elevate trunk from surface and move legs to EOB with bed fully deflatted Transfers Overall transfer level: Needs assistance Transfers: Sit to/from Stand;Stand Pivot Transfers Sit to Stand: Min assist;+2 safety/equipment Stand pivot transfers: Min assist;+2 safety/equipment General transfer comment: cues for hand placement, foot position and sequence with use  of RW to stand and pivot to chair. Pt denied attempting gait  ADL:    Cognition: Cognition Overall Cognitive Status: Impaired/Different from baseline Orientation Level: Oriented X4 Cognition Arousal/Alertness: Awake/alert Behavior During Therapy: WFL for tasks assessed/performed Overall Cognitive Status: Impaired/Different from baseline Area of Impairment: Memory;Orientation Orientation Level: Time Memory: Decreased short-term memory  Blood pressure 122/79, pulse 103, temperature 98.2 F (36.8 C), temperature source Oral, resp. rate 20, height 5' 4.17" (1.63 m), weight 165.79 kg (365 lb 8 oz), SpO2 97.00%. Physical Exam  Constitutional: She is oriented to person, place, and time.  HENT:  Head: Normocephalic.  Eyes: EOM are normal.  Neck: Normal range of motion. Neck supple. No thyromegaly present.  Cardiovascular: Normal rate and regular rhythm.   Respiratory: Effort normal and breath sounds normal. No respiratory distress.  GI: Soft. Bowel sounds are normal.  Obese  Neurological: She is alert and oriented to person, place, and time.  Skin:  Staples intact to abdomen  Psychiatric: She has a normal mood and affect.  5/5 Bilat Delt , Bi, tri, grip 4/5 B HF, KE, Ankle DF Sensory intact  Results for orders placed during the hospital encounter of 07/18/13 (from the past 24 hour(s))  GLUCOSE, CAPILLARY     Status: Abnormal   Collection Time    07/25/13  8:07 AM      Result Value Ref Range   Glucose-Capillary 124 (*) 70 - 99 mg/dL   Comment 1 Notify RN    GLUCOSE, CAPILLARY     Status: Abnormal   Collection Time    07/25/13 11:53 AM      Result Value Ref Range   Glucose-Capillary 115 (*) 70 - 99 mg/dL   No results found.  Assessment/Plan: Diagnosis: Deconditioning after post op respiratory failure 1. Does the need for close, 24 hr/day medical supervision in concert with the patient's rehab needs make it unreasonable for this patient to be served in a less  intensive setting? Potentially 2. Co-Morbidities requiring supervision/potential complications: Morbid obesity, HTN, Diastolic CHF 3. Due to bladder management, bowel management, safety, skin/wound care, disease management, medication administration, pain management and patient education, does the patient require 24 hr/day rehab nursing? Yes 4. Does the patient require coordinated care of a physician, rehab nurse, PT (1-2 hrs/day, 5 days/week) and OT (1-2 hrs/day, 5 days/week) to address physical and functional deficits in the context of the above medical diagnosis(es)? Potentially Addressing deficits in the following areas: balance, endurance, locomotion, strength, transferring, bowel/bladder control, bathing, dressing and toileting 5. Can the patient actively participate in an intensive therapy program of at least 3 hrs of therapy per day at least 5 days per week? Potentially 6. The potential for patient to make measurable gains while on inpatient rehab is good 7. Anticipated functional outcomes upon discharge from inpatient rehab are modified independent  with PT, modified independent with OT, NA with SLP. 8. Estimated rehab length of stay to reach the above functional goals is: 7-10 days 9. Does the patient have adequate social supports to accommodate these discharge functional goals? Yes 10. Anticipated D/C setting: Home 11. Anticipated post D/C treatments: [place] therapy 12. Overall Rehab/Functional Prognosis: excellent  RECOMMENDATIONS: This patient's condition is appropriate for continued rehabilitative care in the following setting: CIR if still requiring at least min A with PT/OT re eval Patient has agreed to participate in recommended program. Yes Note that insurance prior authorization may be required for reimbursement for recommended care.  Comment: Last PT note 6/6 needs update    07/26/2013

## 2013-07-26 NOTE — Care Management Note (Addendum)
  Page 2 of 2   07/28/2013     11:09:50 AM CARE MANAGEMENT NOTE 07/28/2013  Patient:  ANOLA, MCGOUGH   Account Number:  [id number]  Date Initiated:  07/21/2013  Documentation initiated by:  Sandi Mariscal  Subjective/Objective Assessment:   hx of morbid obesity, htn, OSA, asthma underwent laparoscopic vertical sleeve gastrectomy 5/28 in [place], [place]. Was d/c home 5/29.. Adm AM 6/01 w/ acute hypoxic failure requiring intubation     Action/Plan:   await medical stability to determine d/c needs   Anticipated DC Date:  07/28/2013   Anticipated DC Plan:  IP REHAB FACILITY      DC Planning Services  CM consult      Choice offered to / List presented to:             Status of service:  Completed, signed off Medicare Important Message given?   (If response is "NO", the following Medicare IM given date fields will be blank) Date Medicare IM given:   Date Additional Medicare IM given:    Discharge Disposition:  IP REHAB FACILITY  Per UR Regulation:  Reviewed for med. necessity/level of care/duration of stay  If discussed at [place] of Stay Meetings, dates discussed:   07/26/2013    Comments:  Mariann Laster RN, BSN, MSHL, CCM  Nurse - Case Manager,  ([facility])  [phone]  07/26/2013 D/c 07/27/2013 to [place] RN, BSN, MSHL, CCM  Nurse - Case Manager,  (Unit [facility])  [phone]  07/26/2013 IM:  n/a Hx/o morbid obesity, htn, OSA, asthma underwent laparoscopic vertical sleeve gastrectomy 5/28 in [place], [place] and d/c home 5/29.  Adm AM 6/01 w/ acute hypoxic failure requiring intubation Social:  from home with family Wt: 75  - patient agrees to [place] authorization pending with [place] PPO SW / Kendell Bane aware of posible need for SNF back up plan. bariatric RN and dietitian Consults this admission Intesity of Service.  Potassium 2.8 with IVPB replacement Disposition: pending    07/25/13- 1500- Marvetta Gibbons RN, BSN [phone] PT recommending CIR  consult, OT eval placed, - CIR consulted for possible admission- will follow for further d/c planning.

## 2013-07-26 NOTE — Progress Notes (Signed)
Lab called critical K 2.8.  MD text paged.  Will continue to monitor.

## 2013-07-26 NOTE — Progress Notes (Signed)
Pt refusing to take K PO.  MD notified.  Will continue to monitor.

## 2013-07-27 ENCOUNTER — Inpatient Hospital Stay (HOSPITAL_COMMUNITY)
Admission: RE | Admit: 2013-07-27 | Discharge: 2013-08-04 | DRG: 945 | Disposition: A | Discharge status: Home Health | Payer: BC Managed Care – PPO | Source: Intra-hospital | Attending: Physical Medicine & Rehabilitation | Admitting: Physical Medicine & Rehabilitation

## 2013-07-27 DIAGNOSIS — G4733 Obstructive sleep apnea (adult) (pediatric): Secondary | ICD-10-CM | POA: Diagnosis present

## 2013-07-27 DIAGNOSIS — M5412 Radiculopathy, cervical region: Secondary | ICD-10-CM | POA: Diagnosis present

## 2013-07-27 DIAGNOSIS — Z79899 Other long term (current) drug therapy: Secondary | ICD-10-CM

## 2013-07-27 DIAGNOSIS — R5381 Other malaise: Secondary | ICD-10-CM

## 2013-07-27 DIAGNOSIS — M542 Cervicalgia: Secondary | ICD-10-CM | POA: Diagnosis present

## 2013-07-27 DIAGNOSIS — Z903 Acquired absence of stomach [part of]: Secondary | ICD-10-CM

## 2013-07-27 DIAGNOSIS — K5900 Constipation, unspecified: Secondary | ICD-10-CM | POA: Diagnosis present

## 2013-07-27 DIAGNOSIS — I509 Heart failure, unspecified: Secondary | ICD-10-CM

## 2013-07-27 DIAGNOSIS — J45909 Unspecified asthma, uncomplicated: Secondary | ICD-10-CM | POA: Diagnosis present

## 2013-07-27 DIAGNOSIS — Z9089 Acquired absence of other organs: Secondary | ICD-10-CM

## 2013-07-27 DIAGNOSIS — H9209 Otalgia, unspecified ear: Secondary | ICD-10-CM | POA: Diagnosis not present

## 2013-07-27 DIAGNOSIS — E282 Polycystic ovarian syndrome: Secondary | ICD-10-CM | POA: Diagnosis present

## 2013-07-27 DIAGNOSIS — Z5189 Encounter for other specified aftercare: Principal | ICD-10-CM

## 2013-07-27 DIAGNOSIS — H6120 Impacted cerumen, unspecified ear: Secondary | ICD-10-CM | POA: Diagnosis not present

## 2013-07-27 DIAGNOSIS — J9589 Other postprocedural complications and disorders of respiratory system, not elsewhere classified: Secondary | ICD-10-CM

## 2013-07-27 DIAGNOSIS — Z9119 Patient's noncompliance with other medical treatment and regimen: Secondary | ICD-10-CM

## 2013-07-27 DIAGNOSIS — J9600 Acute respiratory failure, unspecified whether with hypoxia or hypercapnia: Secondary | ICD-10-CM | POA: Diagnosis present

## 2013-07-27 DIAGNOSIS — Z91199 Patient's noncompliance with other medical treatment and regimen due to unspecified reason: Secondary | ICD-10-CM

## 2013-07-27 DIAGNOSIS — G5600 Carpal tunnel syndrome, unspecified upper limb: Secondary | ICD-10-CM | POA: Diagnosis present

## 2013-07-27 LAB — BASIC METABOLIC PANEL
BUN: 11 mg/dL (ref 6–23)
CO2: 25 mEq/L (ref 19–32)
Calcium: 9.3 mg/dL (ref 8.4–10.5)
Chloride: 97 mEq/L (ref 96–112)
Creatinine, Ser: 0.76 mg/dL (ref 0.50–1.10)
GFR calc Af Amer: >90 mL/min (ref >90)
GFR calc non Af Amer: >90 mL/min (ref >90)
Glucose, Bld: 104 mg/dL — ABNORMAL HIGH (ref 70–99)
POTASSIUM: 3.6 meq/L — AB (ref 3.7–5.3)
Sodium: 138 mEq/L (ref 137–147)

## 2013-07-27 MED ORDER — ONDANSETRON HCL 4 MG PO TABS
4.0000 mg | ORAL_TABLET | Freq: Four times a day (QID) | ORAL | Status: DC
  Filled 2013-07-27: qty 1

## 2013-07-27 MED ORDER — BENEPROTEIN PO POWD
1.0000 | Freq: Four times a day (QID) | ORAL | Status: DC
  Administered 2013-07-27 (×2): 6 g via ORAL
  Filled 2013-07-27: qty 227

## 2013-07-27 MED ORDER — BUDESONIDE 0.25 MG/2ML IN SUSP
0.2500 mg | Freq: Two times a day (BID) | RESPIRATORY_TRACT | Status: DC
  Administered 2013-07-27 – 2013-08-03 (×12): 0.25 mg via RESPIRATORY_TRACT
  Filled 2013-07-27 (×18): qty 2

## 2013-07-27 MED ORDER — CALCIUM CARBONATE 1250 (500 CA) MG PO TABS
1250.0000 mg | ORAL_TABLET | Freq: Every day | ORAL | Status: DC
  Administered 2013-07-27: 1250 mg via ORAL
  Filled 2013-07-27 (×3): qty 1

## 2013-07-27 MED ORDER — ALBUTEROL SULFATE (2.5 MG/3ML) 0.083% IN NEBU
2.5000 mg | INHALATION_SOLUTION | Freq: Once | RESPIRATORY_TRACT | Status: AC
  Administered 2013-07-27: 2.5 mg via RESPIRATORY_TRACT

## 2013-07-27 MED ORDER — VITAMIN B-12 1000 MCG PO TABS
1000.0000 ug | ORAL_TABLET | Freq: Every day | ORAL | Status: DC
  Administered 2013-07-27: 1000 ug via ORAL
  Filled 2013-07-27: qty 1

## 2013-07-27 MED ORDER — RESOURCE THICKENUP CLEAR PO POWD
ORAL | Status: DC | PRN
  Filled 2013-07-27: qty 125

## 2013-07-27 MED ORDER — OXYCODONE HCL 5 MG PO TABS
5.0000 mg | ORAL_TABLET | Freq: Four times a day (QID) | ORAL | Status: DC | PRN
  Administered 2013-07-27: 5 mg via ORAL
  Filled 2013-07-27: qty 1

## 2013-07-27 MED ORDER — MOMETASONE FURO-FORMOTEROL FUM 200-5 MCG/ACT IN AERO
2.0000 | INHALATION_SPRAY | Freq: Two times a day (BID) | RESPIRATORY_TRACT | Status: DC
  Filled 2013-07-27: qty 8.8

## 2013-07-27 MED ORDER — MOMETASONE FURO-FORMOTEROL FUM 200-5 MCG/ACT IN AERO
2.0000 | INHALATION_SPRAY | Freq: Two times a day (BID) | RESPIRATORY_TRACT | Status: DC
  Administered 2013-07-27 – 2013-08-03 (×10): 2 via RESPIRATORY_TRACT
  Filled 2013-07-27 (×3): qty 8.8

## 2013-07-27 NOTE — Progress Notes (Signed)
PMR Admission Coordinator Pre-Admission Assessment  Patient: Melissa Erickson is an 33 y.o., female  MRN: [id number]  DOB: November 11, 1980  Height: 5' 4.17" (163 cm)  Weight: (pt said wt her later she dont feel good this morning)  Insurance Information  HMO: PPO: yes PCP: IPA: 80/20: OTHER:  PRIMARY: BCBS of [place] Policy#: [id number] Subscriber: self  CM Name: Britt Boozer Phone#: [phone] Fax#: [phone]; fax updated clinicals on 1/32/44  Pre-Cert#: [id number] Employer: [employer]  Benefits: Phone #: [phone] Name: Lavell Anchors. Date: 02/17/13 Deduct: $500 Out of Pocket Max: $700 Life Max: none  CIR: 70/30% SNF: 70/30%  Outpatient: 70/30% Co-Pay:  Home Health: 70/30% Co-Pay:  DME: 70/30% Co-Pay:  Providers: In network SECONDARY: Policy#: Subscriber:  CM Name: Phone#: Fax#:  Pre-Cert#: Employer:  Benefits: Phone #: Name:  Eff. Date: Deduct: Out of Pocket Max: Life Max:  CIR: SNF:  Outpatient: Co-Pay:  Home Health: Co-Pay:  DME: Co-Pay:  Medicaid Application Date: Case Manager:  Disability Application Date: Case Worker:  Emergency Contact Information  Contact Information    Name  Relation  Home  Work  Mobile    McCoy,Carolyn  Stepmother    [phone]    Drue Flirt  [phone]   [phone]    [facility]  Mother  [phone]        Current Medical History  Patient Admitting Diagnosis: Deconditioning after post op respiratory failure  History of Present Illness:    :  HPI: Melissa Erickson is a 33 y.o. right-handed female with history of morbid obesity, hypertension, OSA, asthma. Patient independent prior to admission. Patient recently underwent laparoscopic vertical sleeve gastrectomy 07/14/2013 in [facility]. She was discharged to home the following day and subsequently admitted to the PCCM. service 07/18/2013 after presenting with acute hypoxic failure requiring intubation shortly after arrival. CT abdomen pelvis and chest showed no  pulmonary embolus. Multilobar groundglass and consolidation indicated of edema versus pneumonia. Placed on broad-spectrum antibiotics and since been completed. Venous Doppler studies lower extremities negative for DVT. Followup per critical care pulmonary services. Echocardiogram with ejection fraction 55% mild LVH grade 1 diastolic dysfunction. Patient extubated 07/22/2013. Mild elevation troponin followup cardiology services and felt to be demand ischemia and monitored with no further plan for cardiac evaluation. Maintained on subcutaneous heparin for DVT prophylaxis. Patient continues on a bariatric diet with nectar liquids. Physical and occupational therapy evaluations completed 07/23/2013 with recommendations for physical medicine rehabilitation consult. Patient was admitted for comprehensive rehabilitation program    Past Medical History  Past Medical History   Diagnosis  Date   .  Polycystic ovarian syndrome    .  Asthma    .  Migraine     Family History  family history is not on file.  Prior Rehab/Hospitalizations: Pt. Reports she underwent bariatric surgery on 07/14/13 with an overnight stay  Current Medications  Current facility-administered medications:albuterol (PROVENTIL) (2.5 MG/3ML) 0.083% nebulizer solution 2.5 mg, 2.5 mg, Nebulization, Q2H PRN, Carin Hock, MD, 2.5 mg at 07/18/13 1231; albuterol (PROVENTIL) (2.5 MG/3ML) 0.083% nebulizer solution 2.5 mg, 2.5 mg, Nebulization, BID, Allie Bossier, MD, 2.5 mg at 07/27/13 0749  calcium carbonate (OS-CAL - dosed in mg of elemental calcium) tablet 1,250 mg, 1,250 mg, Oral, Q breakfast, Barton Dubois, MD, 1,250 mg at 07/27/13 1128; diphenhydrAMINE (BENADRYL) capsule 25 mg, 25 mg, Oral, Q6H PRN, Grace Bushy Minor, NP, 25 mg at 07/24/13 1830; heparin injection 5,000 Units, 5,000 Units, Subcutaneous, 3 times per day, Carin Hock, MD,  5,000 Units at 07/27/13 1425  hydrocortisone cream 1 %, , Topical, TID PRN, Cherene Altes,  MD; mometasone-formoterol [facility]) 200-5 MCG/ACT inhaler 2 puff, 2 puff, Inhalation, BID, Barton Dubois, MD; multivitamin with minerals tablet 1 tablet, 1 tablet, Oral, BID, Dalene Carrow, RD, 1 tablet at 07/27/13 1029; ondansetron (ZOFRAN) tablet 4 mg, 4 mg, Oral, 4 times per day, Barton Dubois, MD  oxyCODONE (Oxy IR/ROXICODONE) immediate release tablet 5 mg, 5 mg, Oral, Q6H PRN, Barton Dubois, MD, 5 mg at 07/27/13 1542; pantoprazole (PROTONIX) EC tablet 40 mg, 40 mg, Oral, Q1200, Brand Males, MD, 40 mg at 07/27/13 1128; protein supplement (RESOURCE BENEPROTEIN) powder 6 g, 1 scoop, Oral, QID, Barton Dubois, MD, 6 g at 07/27/13 1425; [place], , Oral, PRN, Barton Dubois, MD  sodium chloride 0.9 % injection 10-40 mL, 10-40 mL, Intracatheter, PRN, Cherene Altes, MD, 10 mL at 07/27/13 1336; vitamin B-12 (CYANOCOBALAMIN) tablet 1,000 mcg, 1,000 mcg, Oral, Daily, Barton Dubois, MD, 1,000 mcg at 07/27/13 1029  Patients Current Diet:  Precautions / Restrictions  Precautions  Precautions: Fall  Precaution Comments:  Restrictions  Weight Bearing Restrictions: No  Prior Activity Level  Community (5-7x/wk): Pt.is employed as a Publishing copy, 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  Current Functional Level  Cognition  Overall Cognitive Status: Within Functional Limits for tasks assessed  Orientation Level: Oriented X4   Extremity Assessment  (includes Sensation/Coordination)     ADLs  Overall ADL's : Needs assistance/impaired  Grooming: Brushing hair;Supervision/safety;Sitting  Grooming Details (indicate cue type and reason): pt  able to untie hair tie from hair ( take down pony tail ) with bil UE  Upper Body Bathing Details (indicate cue type and reason): reports total (A ) from tech just prior to OT arrival  Lower Body Bathing: Total assistance;Bed level  Toilet Transfer: Minimal assistance;+2 for physical assistance;Stand-pivot;Ambulation;RW  Functional mobility during ADLs: Minimal assistance;Rolling walker;+2 for physical assistance  General ADL Comments: Pt had just gotten back to bed. OT session focused on R hand. OT provided tan theraputty and a squeze ball, edcuated pt in use and provided handout. Pt states fingers 2 and 3 are numb. Pt with decreased strength R hand. Pt perform ed exercise with theraputty as well as wiith ball. encouraged pt to use RUE for all ADL tasks rather than not using it.   Mobility  Overal bed mobility: Needs Assistance  Bed Mobility: Supine to Sit;Sit to Supine  Rolling: Mod assist;+2 for physical assistance  Sidelying to sit: Mod assist;+2 for physical assistance  Supine to sit: Min assist  Sit to supine: Min assist  General bed mobility comments: Assist for trunk elevation when coming to sitting position, and assist for LE elevation into bed when transitioning to supine.   Transfers  Overall transfer level: Needs assistance  Equipment used: Rolling walker (2 wheeled) (bariatric)  Transfers: Sit to/from Stand  Sit to Stand: Min guard  Stand pivot transfers: Mod assist  General transfer comment: VC's for hand placement on seated surface for safety.   Ambulation / Gait / Stairs / Wheelchair Mobility  Ambulation/Gait  Ambulation/Gait assistance: Min guard  Ambulation  Distance (Feet): 175 Feet  Assistive device: Rolling walker (2 wheeled)  Gait Pattern/deviations: Step-through pattern;Decreased stride length;Wide base of support  Gait velocity: Decreased  Gait velocity interpretation: Below normal speed for age/gender  General Gait Details: 1 seated rest break required. Pt very  fatigued at end of gait training, and was slightly SOB. Pt states she did not have a limit for distance when ambulating prior to admission.   Posture / Balance    Special needs/care consideration  BiPAP/CPAP yes  CPM no  Continuous Drip IV no  Dialysis no  Life Vest no  Oxygen no  Special Bed Yes, currently on "Big Turn 2" bed  Trach Size no  Wound Vac (area) no  Skin 7 stapled abdominal incisions  Bowel mgmt: Last BM 07/26/13  Bladder mgmt: Continent, using BSC  Diabetic mgmt no   Previous Home Environment  Living Arrangements: Children  Available Help at Discharge: Family;Friend(s);Available 24 hours/day  Type of Home: Apartment  Home Layout: One level  Home Access: Level entry  Bathroom Shower/Tub: Tub/shower unit;Curtain  Bathroom Toilet: Handicapped height  Bathroom Accessibility: Yes  How Accessible: Accessible via walker  [place]: No  Discharge Living Setting  Plans for Discharge Living Setting: Patient's home  Type of Home at Discharge: Apartment  Discharge Home Layout: One level  Discharge Home Access: Level entry  Discharge Bathroom Shower/Tub: Tub/shower unit  Discharge Bathroom Toilet: Handicapped height  Discharge Bathroom Accessibility: Yes  How Accessible: Accessible via walker  Does the patient have any problems obtaining your medications?: No  Social/Family/Support Systems  Patient Roles: Parent  Anticipated Caregiver: "Godmother" Tree surgeon Information: [phone]  Ability/Limitations of Caregiver: none  Caregiver Availability: 24/7  Discharge Plan Discussed with Primary Caregiver: No  Goals/Additional Needs  Patient/Family Goal for Rehab: mod I for PT and OT; n/a SLP  Expected length of stay: 7 to10 days  Cultural Considerations: none  Dietary Needs: yes; pt. states she was to be on a clear liquid diet x 2 weeks following her bariatric surgery with protein powder and thickener to be added for nutritional  needs and satiation. Pt. states that she was to begin solids at 2 weeks post surgery but has now been advanced to solids from this medical hospitalization and not by the instructions of her bariatric surgeon.  Equipment Needs: TBD  Pt/Family Agrees to Admission and willing to participate: Yes  Program Orientation Provided & Reviewed with Pt/Caregiver Including Roles & Responsibilities: Yes  Decrease burden of Care through IP rehab admission: no  Possible need for SNF placement upon discharge: not anticipated  Patient Condition: This patient's condition remains as documented in the consult dated 07/26/13 , in which the Rehabilitation Physician determined and documented that the patient's condition is appropriate for intensive rehabilitative care in an inpatient rehabilitation facility. Will admit to inpatient rehab today.  Preadmission Screen Completed By: Gerlean Ren PT , 07/27/2013 4:46 PM  ______________________________________________________________________  Discussed status with Dr. Naaman Plummer on 07/27/13 at 9 and received telephone approval for admission today.  Admission Coordinator: Gerlean Ren, PT time 1700 Sudie Grumbling 07/27/13  Cosigned by: Meredith Staggers, MD [07/27/2013 5:30 PM]

## 2013-07-27 NOTE — Progress Notes (Signed)
Occupational Therapy Treatment Patient Details Name: TAMIYAH MOULIN MRN: [id number] DOB: 12-Jul-1980 Today's Date: 07/27/2013    History of present illness 71 F with hx of morbid obesity, htn, OSA, asthma underwent laparoscopic vertical sleeve gastrectomy 5/28 in [place], [place]. Was discharged home the following day. Adm to PCCM service early AM 6/01 after presenting with acute hypoxic failure requiring intubation shortly after arrival. VDRF 6/1-6/5      Follow Up Recommendations  CIR    Equipment Recommendations  Other (comment)                  ADL                                         General ADL Comments: Pt had just gotten back to bed.  OT session focused on R hand.  OT provided tan theraputty and a squeze ball, edcuated pt in use and provided handout. Pt states fingers 2 and 3 are numb.  Pt with decreased strength R hand. Pt perform ed exercise with theraputty as well as wiith ball. encouraged pt to use RUE for all ADL tasks  rather than not using it.        Vision                                 Frequency Min 2X/week     Progress Toward Goals  OT Goals(current goals can now be found in the care plan section)  Progress towards OT goals: Progressing toward goals     Plan         End of Session     Activity Tolerance Patient tolerated treatment well   Patient Left in bed;with call bell/phone within reach   Nurse Communication          Time: 3220-2542 OT Time Calculation (min): 31 min  Charges: OT General Charges $OT Visit: 1 Procedure OT Treatments $Therapeutic Exercise: 23-37 mins  Jaydyn Menon D 07/27/2013, 1:03 PM

## 2013-07-27 NOTE — PMR Pre-admission (Signed)
PMR Admission Coordinator Pre-Admission Assessment  Patient: Melissa Erickson is an 33 y.o., female MRN: [id number] DOB: 1980-09-26 Height: 5' 4.17" (163 cm) Weight:  (pt said wt her later she dont feel good this morning)              Insurance Information HMO:     PPO:  yes     PCP:      IPA:      80/20:      OTHER:   PRIMARY:  BCBS of       Policy#: [id number]      Subscriber:  self CM Name:  Britt Boozer      Phone#:  [phone]     Fax#:  [phone]; fax updated clinicals on 0/34/91 Pre-Cert#:  [id number]      Employer:  [employer] Benefits:  Phone #: [phone]     Name:  Lavell Anchors. Date:  02/17/13     Deduct: $500      Out of Pocket Max: $700      Life Max:  none CIR:  70/30%      SNF: 70/30% Outpatient: 70/30%     Co-Pay:  Home Health: 70/30%      Co-Pay:  DME:  70/30%     Co-Pay:   Providers:  In network SECONDARY:        Policy#:       Subscriber:  CM Name:       Phone#:      Fax#:  Pre-Cert#:       Employer:  Benefits:  Phone #:      Name:  Eff. Date:      Deduct:      Out of Pocket Max:       Life Max:  CIR:       SNF:  Outpatient:      Co-Pay:  Home Health:       Co-Pay:  DME:      Co-Pay:   Medicaid Application Date:       Case Manager:  Disability Application Date:       Case Worker:   Emergency Contact Information Contact Information   Name Relation Home Work Mobile   McCoy,Carolyn Stepmother   [phone]   Drue Flirt [phone]  [phone]   [facility] Mother [phone]       Current Medical History  Patient Admitting Diagnosis: Deconditioning after post op respiratory failure  History of Present Illness:   :  HPI: Melissa Erickson is a 33 y.o. right-handed female with history of morbid obesity, hypertension, OSA, asthma. Patient independent prior to admission. Patient recently underwent laparoscopic vertical sleeve gastrectomy 07/14/2013 in [facility]. She was discharged to home the following day and  subsequently admitted to the PCCM. service 07/18/2013 after presenting with acute hypoxic failure requiring intubation shortly after arrival. CT abdomen pelvis and chest showed no pulmonary embolus. Multilobar groundglass and consolidation indicated of edema versus pneumonia. Placed on broad-spectrum antibiotics and since been completed. Venous Doppler studies lower extremities negative for DVT. Followup per critical care pulmonary services. Echocardiogram with ejection fraction 55% mild LVH grade 1 diastolic dysfunction. Patient extubated 07/22/2013. Mild elevation troponin followup cardiology services and felt to be demand ischemia and monitored with no further plan for cardiac evaluation. Maintained on subcutaneous heparin for DVT prophylaxis. Patient continues on a bariatric diet with nectar liquids. Physical and occupational therapy evaluations completed 07/23/2013 with recommendations for physical medicine rehabilitation consult. Patient was admitted for comprehensive rehabilitation program  Past Medical History  Past Medical History  Diagnosis Date  . Polycystic ovarian syndrome   . Asthma   . Migraine     Family History  family history is not on file.  Prior Rehab/Hospitalizations: Pt. Reports she underwent bariatric surgery on 07/14/13 with an overnight stay  Current Medications  Current facility-administered medications:albuterol (PROVENTIL) (2.5 MG/3ML) 0.083% nebulizer solution 2.5 mg, 2.5 mg, Nebulization, Q2H PRN, Carin Hock, MD, 2.5 mg at 07/18/13 1231;  albuterol (PROVENTIL) (2.5 MG/3ML) 0.083% nebulizer solution 2.5 mg, 2.5 mg, Nebulization, BID, Allie Bossier, MD, 2.5 mg at 07/27/13 0749 calcium carbonate (OS-CAL - dosed in mg of elemental calcium) tablet 1,250 mg, 1,250 mg, Oral, Q breakfast, Barton Dubois, MD, 1,250 mg at 07/27/13 1128;  diphenhydrAMINE (BENADRYL) capsule 25 mg, 25 mg, Oral, Q6H PRN, Grace Bushy Minor, NP, 25 mg at 07/24/13 1830;  heparin  injection 5,000 Units, 5,000 Units, Subcutaneous, 3 times per day, Carin Hock, MD, 5,000 Units at 07/27/13 1425 hydrocortisone cream 1 %, , Topical, TID PRN, Cherene Altes, MD;  mometasone-formoterol [facility]) 200-5 MCG/ACT inhaler 2 puff, 2 puff, Inhalation, BID, Barton Dubois, MD;  multivitamin with minerals tablet 1 tablet, 1 tablet, Oral, BID, Dalene Carrow, RD, 1 tablet at 07/27/13 1029;  ondansetron (ZOFRAN) tablet 4 mg, 4 mg, Oral, 4 times per day, Barton Dubois, MD oxyCODONE (Oxy IR/ROXICODONE) immediate release tablet 5 mg, 5 mg, Oral, Q6H PRN, Barton Dubois, MD, 5 mg at 07/27/13 1542;  pantoprazole (PROTONIX) EC tablet 40 mg, 40 mg, Oral, Q1200, Brand Males, MD, 40 mg at 07/27/13 1128;  protein supplement (RESOURCE BENEPROTEIN) powder 6 g, 1 scoop, Oral, QID, Barton Dubois, MD, 6 g at 07/27/13 1425;  [place], , Oral, PRN, Barton Dubois, MD sodium chloride 0.9 % injection 10-40 mL, 10-40 mL, Intracatheter, PRN, Cherene Altes, MD, 10 mL at 07/27/13 1336;  vitamin B-12 (CYANOCOBALAMIN) tablet 1,000 mcg, 1,000 mcg, Oral, Daily, Barton Dubois, MD, 1,000 mcg at 07/27/13 1029  Patients Current Diet:    Precautions / Restrictions Precautions Precautions: Fall Precaution Comments:  Restrictions Weight Bearing Restrictions: No   Prior Activity Level Community (5-7x/wk): Pt.is employed as a Publishing copy, 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  Current Functional Level Cognition  Overall Cognitive Status: Within Functional Limits for tasks assessed Orientation Level: Oriented X4    Extremity  Assessment (includes Sensation/Coordination)          ADLs  Overall ADL's : Needs assistance/impaired Grooming: Brushing hair;Supervision/safety;Sitting Grooming Details (indicate cue type and reason): pt able to untie hair tie from hair ( take down pony tail ) with bil UE Upper Body Bathing Details (indicate cue type and reason): reports total (A ) from tech just prior to OT arrival Lower Body Bathing: Total assistance;Bed level Toilet Transfer: Minimal assistance;+2 for physical assistance;Stand-pivot;Ambulation;RW Functional mobility during ADLs: Minimal assistance;Rolling walker;+2 for physical assistance General ADL Comments: Pt had just gotten back to bed.  OT session focused on R hand.  OT provided tan theraputty and a squeze ball, edcuated pt in use and provided handout. Pt states fingers 2 and 3 are numb.  Pt with decreased strength R hand. Pt  perform ed exercise with theraputty as well as wiith ball. encouraged pt to use RUE for all ADL tasks  rather than not using it.      Mobility  Overal bed mobility: Needs Assistance Bed Mobility: Supine to Sit;Sit to Supine Rolling: Mod assist;+2 for physical assistance Sidelying to sit: Mod assist;+2 for physical assistance Supine to sit: Min assist Sit to supine: Min assist General bed mobility comments: Assist for trunk elevation when coming to sitting position, and assist for LE elevation into bed when transitioning to supine.     Transfers  Overall transfer level: Needs assistance Equipment used: Rolling walker (2 wheeled) (bariatric) Transfers: Sit to/from Stand Sit to Stand: Min guard Stand pivot transfers: Mod assist General transfer comment: VC's for hand placement on seated surface for safety.     Ambulation / Gait / Stairs / Wheelchair Mobility  Ambulation/Gait Ambulation/Gait assistance: Physicist, medical (Feet): 175 Feet Assistive device: Rolling walker (2 wheeled) Gait Pattern/deviations: Step-through  pattern;Decreased stride length;Wide base of support Gait velocity: Decreased Gait velocity interpretation: Below normal speed for age/gender General Gait Details: 1 seated rest break required. Pt very fatigued at end of gait training, and was slightly SOB. Pt states she did not have a limit for distance when ambulating prior to admission.    Posture / Balance      Special needs/care consideration BiPAP/CPAP  yes CPM  no Continuous Drip IV   no Dialysis no         Life Vest  no Oxygen  no Special Bed  Yes, currently on "Big Turn  2" bed Trach Size  no Wound Vac (area)  no       Skin  7 stapled abdominal incisions                              Bowel mgmt:  Last BM 07/26/13 Bladder mgmt:  [place], using BSC  Diabetic mgmt  no     Previous Home Environment Living Arrangements: Children Available Help at Discharge: Family;Friend(s);Available 24 hours/day Type of Home: Apartment Home Layout: One level Home Access: Level entry Bathroom Shower/Tub: Tub/shower unit;Curtain Bathroom Toilet: Handicapped height Bathroom Accessibility: Yes How Accessible: Accessible via walker [place]: No  Discharge Living Setting Plans for Discharge Living Setting: Patient's home Type of Home at Discharge: Apartment Discharge Home Layout: One level Discharge Home Access: Level entry Discharge Bathroom Shower/Tub: Tub/shower unit Discharge Bathroom Toilet: Handicapped height Discharge Bathroom Accessibility: Yes How Accessible: Accessible via walker Does the patient have any problems obtaining your medications?: No  Social/Family/Support Systems Patient Roles: Parent Anticipated Caregiver: "Godmother" Engineer, building services Information: [phone] Ability/Limitations of Caregiver: none Caregiver Availability: 24/7 Discharge Plan Discussed with Primary Caregiver: No    Goals/Additional Needs Patient/Family Goal for Rehab: mod I for PT and OT; n/a  SLP Expected length of stay: 7 to10 days Cultural Considerations: none Dietary Needs: yes; pt. states she was to be on a clear liquid diet x 2 weeks following her bariatric surgery with protein powder and thickener to be added for nutritional needs and satiation.  Pt. states that she was to begin solids at 2 weeks post surgery but has now been advanced to solids from this medical hospitalization  and not by the instructions of her bariatric surgeon.   Equipment Needs: TBD Pt/Family Agrees to Admission and willing to participate: Yes Program Orientation Provided & Reviewed with Pt/Caregiver Including Roles  & Responsibilities:  Yes   Decrease burden of Care through IP rehab admission: no   Possible need for SNF placement upon discharge: not anticipated   Patient Condition: This patient's condition remains as documented in the consult dated  07/26/13 , in which the Rehabilitation Physician determined and documented that the patient's condition is appropriate for intensive rehabilitative care in an inpatient rehabilitation facility. Will admit to inpatient rehab today.  Preadmission Screen Completed By:  Gerlean Ren PT , 07/27/2013 4:46 PM ______________________________________________________________________   Discussed status with Dr. Naaman Plummer on 07/27/13 at   41  and received telephone approval for admission today.  Admission Coordinator:  Gerlean Ren, PT time  1700  Sudie Grumbling 07/27/13

## 2013-07-27 NOTE — Discharge Summary (Signed)
Physician Discharge Summary  Melissa Erickson NGE:[id number] DOB: Jan 22, 1981 DOA: 07/18/2013  PCP: Leamon Arnt, MD  Admit date: 07/18/2013 Discharge date: 07/27/2013  Time spent: >30 minutes  Recommendations for Outpatient Follow-up:  BMET to follow electrolytes (in 1-2 days) Physical rehabilitation as per CIR protocol Daily CPAP at bedtime  Discharge Diagnoses:  Principal Problem:   Acute respiratory failure Active Problems:   Morbid obesity   ARDS (adult respiratory distress syndrome)   Hypertension   CHF (congestive heart failure)   NSTEMI (non-ST elevated myocardial infarction), type 2 secondary to demand ischemia sue to sepsis   Discharge Condition: stable and improved.  Diet recommendation: low sodium bariatric diet with nectar thick liquids  Filed Weights   07/24/13 0600 07/25/13 0500 07/26/13 0455  Weight: 157.852 kg (348 lb) 154.223 kg (340 lb) 165.79 kg (365 lb 8 oz)    History of present illness:  68 F W/ Hx of morbid obesity, HTN, OSA, and well-controlled Asthma, who underwent laparoscopic vertical sleeve gastrectomy 5/28 in [place] (Dr. Azucena Freed office: [phone], cell: [phone]). She was discharged home the following day, and subsequently admitted to the PCCM service early in the AM 6/01 after presenting with acute hypoxic failure requiring intubation shortly after arrival.   Hospital Course:  Acute resp failure due to ARDS  -Extubated 6/5 and doing great since.  -Resolved   OSA  -states does not use CPAP at home secondary to disliking the mask  -counseled patient on the sequela of not using her CPAP - patient agreed to use CPAP while in the hospital   NSTEMI Type 2 / Sinus tachycardia / Troponin mildly positive  -Secondary to respiratory failure most likely -Diagnosed with diastolic CHF base on echocardiogram   Diastolic CHF  -well compensated at present  -will need to follow heart healthy low sodium diet  -no lasix on daily basis  needed at this point.   Hypokalemia  -replace and follow trend - Mg is normal  -at discharge K 3.6 -might required daily maintenance base on trend.  -recommend 41meq daily  AKI, nonoliguric  -BUN/creatinine have now normalized  -Continue to monitor renal status intermittently  Severe obesity  -S/P bariatric surgery 5/28 in [place]  -6/2 EGD showed, no leak from sleeve  -Continue on bariatric diet  -bariatric nurse and dietician consulted; will follow rec's for nutrition and supplementation   Severe sepsis / SIRS  -No source of infection identified.  -antibiotics discontinue and patient remains afebrile and with normal WBC's  -will monitor   Asthma/COPD -continue PRN albuterol -continue dulera while inpatient and resume advair at discharge   Procedures: 6/01 Echocardiogram: LVEF 50-55%. Mild LVH. Grade 1 diastolic dysfxn  9/56 LE venous Dopplers: no evidence of DVT  6/2 EGD; no evidence of leak from sleeve gastrectomy  07/20/13 - negative laparascopy and EGD.  07/21/13: tolerating tube feeds. CCS signed off   Consultations:  PCCM  GI  general surgery  Cardiology   Discharge Exam: Filed Vitals:   07/27/13 1001  BP: 132/68  Pulse: 95  Temp: 97.9 F (36.6 C)  Resp: 18   General: No acute respiratory distress; scattered episodes of dry cough on exam  Lungs: Clear to auscultation bilaterally without wheezes or crackles  Cardiovascular: Regular rate and rhythm without murmur gallop or rub normal S1 and S2  Abdomen: Morbidly obese, nontender, soft, bowel sounds positive, no rebound, no ascites, no appreciable mass  Extremities: No significant cyanosis, clubbing, or edema bilateral lower extremities   Discharge Instructions You  were cared for by a hospitalist during your hospital stay. If you have any questions about your discharge medications or the care you received while you were in the hospital after you are discharged, you can call the unit and asked to speak  with the hospitalist on call if the hospitalist that took care of you is not available. Once you are discharged, your primary care physician will handle any further medical issues. Please note that NO REFILLS for any discharge medications will be authorized once you are discharged, as it is imperative that you return to your primary care physician (or establish a relationship with a primary care physician if you do not have one) for your aftercare needs so that they can reassess your need for medications and monitor your lab values.     Medication List         ADVAIR DISKUS 100-50 MCG/DOSE Aepb  Generic drug:  Fluticasone-Salmeterol  Inhale 2 puffs into the lungs 2 (two) times daily.     albuterol 108 (90 BASE) MCG/ACT inhaler  Commonly known as:  PROVENTIL HFA;VENTOLIN HFA  Inhale 2 puffs into the lungs every 6 (six) hours as needed for wheezing.       No Known Allergies    The results of significant diagnostics from this hospitalization (including imaging, microbiology, ancillary and laboratory) are listed below for reference.    Significant Diagnostic Studies: Ct Angio Chest Pe W/cm &/or Wo Cm  07/19/2013   CLINICAL DATA:  Status post gastric bypass surgery 5 days ago with possible leak. Decreased respiratory response.  EXAM: CT ANGIOGRAPHY CHEST  CT ABDOMEN AND PELVIS WITH CONTRAST  TECHNIQUE: Multidetector CT imaging of the chest was performed using the standard protocol during bolus administration of intravenous contrast. Multiplanar CT image reconstructions and MIPs were obtained to evaluate the vascular anatomy. Multidetector CT imaging of the abdomen and pelvis was performed using the standard protocol during bolus administration of intravenous contrast.  CONTRAST:  260mL OMNIPAQUE IOHEXOL 350 MG/ML SOLN  COMPARISON:  None.  FINDINGS: CTA CHEST FINDINGS  Despite a second bolus of IV contrast and re-imaging, image quality is markedly degraded for the detection of segmental and  subsegmental pulmonary emboli, due to body habitus. No central or lobar pulmonary embolus.  Mediastinal lymph nodes measure up to 9 mm in the AP window. No definite hilar or axillary adenopathy. Heart is enlarged. Right IJ central line terminates in the right atrium. No pericardial effusion.  Again, image quality is markedly degraded by body habitus and respiratory motion. There is multilobar patchy bilateral ground-glass with consolidation in both lower lobes. No pleural fluid.  CT ABDOMEN and PELVIS FINDINGS  Liver appears decreased in attenuation diffusely. Gallbladder, adrenal glands, kidneys, spleen and pancreas are grossly unremarkable. Image quality is degraded by body habitus and respiratory motion. Postoperative changes of gastric sleeve procedure. No small bowel dilatation. Colon is unremarkable. Uterus and ovaries are visualized. No pathologically enlarged lymph nodes. No free fluid. Small periumbilical hernia contains fat. No worrisome lytic or sclerotic lesions. There are severe degenerative changes in the thoracic and lumbar spine with flowing anterior osteophytosis.  Review of the MIP images confirms the above findings.  IMPRESSION: 1. Image quality is severely degraded by body habitus and motion. No central or lobar pulmonary embolus. 2. Multi lobar ground-glass and consolidation, indicative of edema or pneumonia. Developing adult respiratory distress syndrome (ARDS) is another consideration. 3. Liver appears fatty. 4. Postoperative changes of recent gastric sleeve procedure without acute finding in the  abdomen or pelvis.   Electronically Signed   By: Lorin Picket M.D.   On: 07/19/2013 14:05   Ct Abdomen Pelvis W Contrast  07/19/2013   CLINICAL DATA:  Status post gastric bypass surgery 5 days ago with possible leak. Decreased respiratory response.  EXAM: CT ANGIOGRAPHY CHEST  CT ABDOMEN AND PELVIS WITH CONTRAST  TECHNIQUE: Multidetector CT imaging of the chest was performed using the standard  protocol during bolus administration of intravenous contrast. Multiplanar CT image reconstructions and MIPs were obtained to evaluate the vascular anatomy. Multidetector CT imaging of the abdomen and pelvis was performed using the standard protocol during bolus administration of intravenous contrast.  CONTRAST:  237mL OMNIPAQUE IOHEXOL 350 MG/ML SOLN  COMPARISON:  None.  FINDINGS: CTA CHEST FINDINGS  Despite a second bolus of IV contrast and re-imaging, image quality is markedly degraded for the detection of segmental and subsegmental pulmonary emboli, due to body habitus. No central or lobar pulmonary embolus.  Mediastinal lymph nodes measure up to 9 mm in the AP window. No definite hilar or axillary adenopathy. Heart is enlarged. Right IJ central line terminates in the right atrium. No pericardial effusion.  Again, image quality is markedly degraded by body habitus and respiratory motion. There is multilobar patchy bilateral ground-glass with consolidation in both lower lobes. No pleural fluid.  CT ABDOMEN and PELVIS FINDINGS  Liver appears decreased in attenuation diffusely. Gallbladder, adrenal glands, kidneys, spleen and pancreas are grossly unremarkable. Image quality is degraded by body habitus and respiratory motion. Postoperative changes of gastric sleeve procedure. No small bowel dilatation. Colon is unremarkable. Uterus and ovaries are visualized. No pathologically enlarged lymph nodes. No free fluid. Small periumbilical hernia contains fat. No worrisome lytic or sclerotic lesions. There are severe degenerative changes in the thoracic and lumbar spine with flowing anterior osteophytosis.  Review of the MIP images confirms the above findings.  IMPRESSION: 1. Image quality is severely degraded by body habitus and motion. No central or lobar pulmonary embolus. 2. Multi lobar ground-glass and consolidation, indicative of edema or pneumonia. Developing adult respiratory distress syndrome (ARDS) is another  consideration. 3. Liver appears fatty. 4. Postoperative changes of recent gastric sleeve procedure without acute finding in the abdomen or pelvis.   Electronically Signed   By: Lorin Picket M.D.   On: 07/19/2013 14:05   Dg Chest Port 1 View  07/24/2013   CLINICAL DATA:  Respiratory distress.  EXAM: PORTABLE CHEST - 1 VIEW  COMPARISON:  Chest x-ray 07/22/2013.  FINDINGS: Patient has been extubated. There is a right-sided internal jugular central venous catheter with tip terminating in the distal superior vena cava. Lung volumes are low. Multifocal irregular interstitial and airspace opacities are again noted throughout the lungs bilaterally, most pronounced in the perihilar regions and in the lung bases. Crowding of the pulmonary vasculature, accentuated by low lung volumes, without frank pulmonary edema. No definite pleural effusions. Heart size is within borderline enlarged. The patient is rotated to the left on today's exam, resulting in distortion of the mediastinal contours and reduced diagnostic sensitivity and specificity for mediastinal pathology.  IMPRESSION: 1. Support apparatus, as above. 2. Persistent multifocal interstitial and airspace opacities throughout the lungs bilaterally, favored to reflect residual multilobar pneumonia.   Electronically Signed   By: Vinnie Langton M.D.   On: 07/24/2013 08:02   Dg Chest Port 1 View  07/22/2013   CLINICAL DATA:  ET tube placement.  EXAM: PORTABLE CHEST - 1 VIEW  COMPARISON:  07/21/2013.  FINDINGS: Moderately  low lung volumes with vascular congestion. The cardiomediastinal silhouette is accentuated. ET tube 2.9 cm above the carina. No pneumothorax.  IMPRESSION: ET tube has been pulled back to a position 2.9 cm above carina. Mild vascular congestion persists.   Electronically Signed   By: Rolla Flatten M.D.   On: 07/22/2013 07:37   Dg Chest Port 1 View  07/21/2013   CLINICAL DATA:  Check endotracheal tube placement  EXAM: PORTABLE CHEST - 1 VIEW   COMPARISON:  07/20/2013  FINDINGS: The cardiac shadow remains enlarged. The overall inspiratory effort is poor. A right jugular central line is seen as well as a nasogastric catheter. These are stable in appearance. An endotracheal tube is seen and appears advanced lying at the level of the carina. This should be withdrawn 2-3 cm.  IMPRESSION: Endotracheal tip at the level of the carina. This should be withdrawn at least 2 cm.  Poor inspiratory effort with crowding of vascular markings.  These results were called by telephone at the time of interpretation on 07/21/2013 at 7:51 AM to Marya Amsler, the patient's nurse who verbally acknowledged these results.   Electronically Signed   By: Inez Catalina M.D.   On: 07/21/2013 07:51   Dg Chest Port 1 View  07/20/2013   CLINICAL DATA:  Hypoxia  EXAM: PORTABLE CHEST - 1 VIEW  COMPARISON:  July 19, 2013 chest radiograph and chest CT  FINDINGS: The endotracheal tube tip is 3.7 cm above the carina. Nasogastric tube and side port are below the diaphragm. No pneumothorax. There is patchy alveolar edema bilaterally, not felt to be changed. No new opacity is seen. Heart is mildly enlarged with pulmonary vascularity within normal limits. No adenopathy.  IMPRESSION: Tube positions as described without pneumothorax. Patchy edema remains without change. No new opacity seen.   Electronically Signed   By: Lowella Grip M.D.   On: 07/20/2013 07:36   Dg Chest Port 1 View  07/19/2013   CLINICAL DATA:  Respiratory failure.  EXAM: PORTABLE CHEST - 1 VIEW  COMPARISON:  Chest x-ray 07/18/2013.  FINDINGS: Endotracheal tube is been withdrawn. Is tip is approximately 2.1 cm above the carina. Right IJ line in stable position. Stable cardiomegaly. Stable diffuse bilateral pulmonary infiltrates. Differential diagnosis includes pulmonary edema and/or pneumonia. No pleural effusion or pneumothorax.  IMPRESSION: 1. Interval withdrawal of endotracheal tube from right mainstem bronchus. The tip is now 2.1 cm  above the carina. Right IJ line in stable position. 2. Persistent severe bilateral pulmonary alveolar infiltrates, unchanged. Differential diagnosis includes pulmonary edema and/or pneumonia. 3. Persistent cardiomegaly.   Electronically Signed   By: Marcello Moores  Register   On: 07/19/2013 07:34   Dg Chest Portable 1 View  07/18/2013   CLINICAL DATA:  Right-sided central line placement  EXAM: PORTABLE CHEST - 1 VIEW  COMPARISON:  Chest x-ray from the same day at 4:51 a.m.  FINDINGS: Interval advancement of the endotracheal tube, now on the right mainstem bronchus. Recommend retraction by 3-4 cm. New right IJ central line, tip in the upper right atrium. No visible pneumothorax. No evidence of new effusion. No change in mediastinal size or contour.  Unchanged hypoaeration and diffuse airspace disease.  These results were called by telephone at the time of interpretation on 07/18/2013 at 6:36 AM to [place], who verbally acknowledged these results.  IMPRESSION: 1. Advancement of endotracheal tube, now in the right mainstem bronchus. Recommend retraction by 3 to 4 cm. 2. Right IJ catheter, tip in the right atrium.  No  pneumothorax. 3. Unchanged diffuse airspace disease.   Electronically Signed   By: Jorje Guild M.D.   On: 07/18/2013 06:40   Dg Chest Portable 1 View  07/18/2013   CLINICAL DATA:  Check endotracheal tube position  EXAM: PORTABLE CHEST - 1 VIEW  COMPARISON:  Chest x-ray from the same day at 2:35 a.m.  FINDINGS: Endotracheal tube tip at the mid thoracic trachea level, near the clavicular heads.  Unchanged heart size, accentuated by low lung volumes and portable technique. Diffuse airspace disease is without appreciable change. No evidence of increasing pleural fluid. No pneumothorax.  IMPRESSION: 1. New endotracheal tube in good position. 2. Unchanged low lung volumes and extensive airspace disease.   Electronically Signed   By: Jorje Guild M.D.   On: 07/18/2013 05:10   Dg Chest Portable 1  View  07/18/2013   CLINICAL DATA:  Respiratory distress.  Shortness of breath.  EXAM: PORTABLE CHEST - 1 VIEW  COMPARISON:  03/06/2013  FINDINGS: Diffuse airspace disease. There may be cardiomegaly, although heart size is accentuated by technique and hypoaeration. No visible effusion. No pneumothorax.  IMPRESSION: Diffuse airspace disease which could reflect edema or multi focal pneumonia.   Electronically Signed   By: Jorje Guild M.D.   On: 07/18/2013 03:02   Dg Abd Portable 1v  07/19/2013   CLINICAL DATA:  Post nasogastric tube placement.  EXAM: PORTABLE ABDOMEN - 1 VIEW  COMPARISON:  CT abdomen pelvis of same day.  FINDINGS: Nasogastric tube terminates in the gastric antrum. Mild gaseous distention of colon. Overall image quality is degraded by body habitus.  IMPRESSION: Satisfactory nasogastric tube placement.   Electronically Signed   By: Lorin Picket M.D.   On: 07/19/2013 19:19    Microbiology: Recent Results (from the past 240 hour(s))  CULTURE, BLOOD (ROUTINE X 2)     Status: None   Collection Time    07/18/13  3:30 AM      Result Value Ref Range Status   Specimen Description BLOOD LEFT ANTECUBITAL   Final   Special Requests BOTTLES DRAWN AEROBIC AND ANAEROBIC 10CC EA   Final   Culture  Setup Time     Final   Value: 07/18/2013 09:10     Performed at [facility]   Culture     Final   Value: NO GROWTH 5 DAYS     Performed at [facility]   Report Status 07/24/2013 FINAL   Final  MRSA PCR SCREENING     Status: None   Collection Time    07/18/13  7:58 AM      Result Value Ref Range Status   MRSA by PCR NEGATIVE  NEGATIVE Final   Comment:            The GeneXpert MRSA Assay (FDA     approved for NASAL specimens     only), is one component of a     comprehensive MRSA colonization     surveillance program. It is not     intended to diagnose MRSA     infection nor to guide or     monitor treatment for     MRSA infections.  CULTURE, BLOOD (ROUTINE X 2)      Status: None   Collection Time    07/18/13  9:05 AM      Result Value Ref Range Status   Specimen Description BLOOD LEFT HAND   Final   Special Requests BOTTLES DRAWN AEROBIC ONLY 4CC  Final   Culture  Setup Time     Final   Value: 07/18/2013 12:34     Performed at [facility]   Culture     Final   Value: NO GROWTH 5 DAYS     Performed at [facility]   Report Status 07/24/2013 FINAL   Final  URINE CULTURE     Status: None   Collection Time    07/18/13 10:29 AM      Result Value Ref Range Status   Specimen Description URINE, CATHETERIZED   Final   Special Requests NONE   Final   Culture  Setup Time     Final   Value: 07/18/2013 16:33     Performed at [facility]     Final   Value: NO GROWTH     Performed at [facility]   Culture     Final   Value: NO GROWTH     Performed at [facility]   Report Status 07/19/2013 FINAL   Final     Labs: Basic Metabolic Panel:  Recent Labs Lab 07/21/13 0330 07/22/13 0540 07/23/13 0530 07/24/13 0445 07/24/13 1430 07/25/13 0545 07/26/13 0529 07/27/13 1200  NA 144 138 135* 141  --  141 140 138  K 3.4* 3.5* 3.1* 2.9* 3.3* 3.1* 2.8* 3.6*  CL 104 95* 93* 98  --  100 97 97  CO2 33* 29 26 27   --  23 25 25   GLUCOSE 148* 151* 129* 132*  --  117* 110* 104*  BUN 15 23 23 19   --  16 13 11   CREATININE 0.89 0.92 0.83 0.75  --  0.79 0.78 0.76  CALCIUM 7.8* 9.1 9.3 9.1  --  9.1 9.0 9.3  MG 2.1 2.0 2.0 2.2 2.3 2.2  --   --   PHOS 1.4* 4.7* 3.0 2.6  --  3.3  --   --    CBC:  Recent Labs Lab 07/21/13 0330 07/22/13 0540 07/23/13 0530 07/24/13 0445 07/25/13 0545  WBC 9.9 10.8* 12.5* 11.5* 9.8  NEUTROABS  --   --   --  8.4* 6.8  HGB 10.6* 12.4 13.4 13.0 13.4  HCT 33.7* 39.5 42.7 41.1 42.1  MCV 89.4 87.8 85.7 86.0 86.3  PLT 129* 123* 139* 144* 117*   Cardiac Enzymes:  Recent Labs Lab 07/26/13 0529  TROPONINI <0.30   BNP: BNP (last 3 results)  Recent Labs   03/06/13 2245 07/18/13 0233 07/24/13 0445  PROBNP 31.1 3595.0* 75.7   CBG:  Recent Labs Lab 07/24/13 2127 07/25/13 0033 07/25/13 0457 07/25/13 0807 07/25/13 1153  GLUCAP 138* 106* 119* 124* 115*    Signed:  Barton Dubois  [facility] 07/27/2013, 3:39 PM

## 2013-07-27 NOTE — H&P (Signed)
Physical Medicine and Rehabilitation Admission H&P  Chief Complaint   Patient presents with   .  Shortness of Breath   :  HPI: Melissa Erickson is a 33 y.o. right-handed female with history of morbid obesity, hypertension, OSA, asthma. Patient independent prior to admission. Patient recently underwent laparoscopic vertical sleeve gastrectomy 07/14/2013 in [facility]. She was discharged to home the following day and subsequently admitted to the PCCM. service 07/18/2013 after presenting with acute hypoxic failure requiring intubation shortly after arrival. CT abdomen pelvis and chest showed no pulmonary embolus. Multilobar groundglass and consolidation indicated of edema versus pneumonia. Placed on broad-spectrum antibiotics and since been completed. Venous Doppler studies lower extremities negative for DVT. Followup per critical care pulmonary services. Echocardiogram with ejection fraction 55% mild LVH grade 1 diastolic dysfunction. Patient extubated 07/22/2013. Mild elevation troponin followup cardiology services and felt to be demand ischemia and monitored with no further plan for cardiac evaluation. Maintained on subcutaneous heparin for DVT prophylaxis. Patient continues on a bariatric diet with nectar liquids. Physical and occupational therapy evaluations completed 07/23/2013 with recommendations for physical medicine rehabilitation consult. Patient was admitted for comprehensive rehabilitation program  ROS Review of Systems  Respiratory: Positive for shortness of breath.  Gastrointestinal: Positive for constipation.  Neurological: Positive for headaches.  All other systems reviewed and are negative  Past Medical History   Diagnosis  Date   .  Polycystic ovarian syndrome    .  Asthma    .  Migraine     Past Surgical History   Procedure  Laterality  Date   .  Cholecystectomy     .  Cesarean section     .  Laparoscopic gastric sleeve resection     .  Laparoscopy  N/A   07/19/2013     Procedure: LAPAROSCOPY DIAGNOSTIC; Surgeon: Harl Bowie, MD; Location: [place]; Service: General; Laterality: N/A;   .  Esophagogastroduodenoscopy  N/A  07/19/2013     Procedure: ESOPHAGOGASTRODUODENOSCOPY (EGD); Surgeon: Harl Bowie, MD; Location: [place]; Service: General; Laterality: N/A;    History reviewed. No pertinent family history.  Social History: reports that she has never smoked. She does not have any smokeless tobacco history on file. She reports that she does not drink alcohol or use illicit drugs.  Allergies: No Known Allergies  Medications Prior to Admission   Medication  Sig  Dispense  Refill   .  ADVAIR DISKUS 100-50 MCG/DOSE AEPB  Inhale 2 puffs into the lungs 2 (two) times daily.     Marland Kitchen  albuterol (PROVENTIL HFA;VENTOLIN HFA) 108 (90 BASE) MCG/ACT inhaler  Inhale 2 puffs into the lungs every 6 (six) hours as needed for wheezing.      Home:  Home Living  Family/patient expects to be discharged to:: Private residence  Living Arrangements: Children  Available Help at Discharge: Family;Available PRN/intermittently  Type of Home: Apartment  Home Access: Level entry  Home Layout: One level  Home Equipment: None  Functional History:  Prior Function  Level of Independence: Independent  Comments: Pt has an 35yo dgtr and states god-mother can assist her some at DC. 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  Transfers  Overall transfer level: Needs assistance  Equipment used: Rolling walker  (2 wheeled) (bariatric)  Transfers: Sit to/from Stand  Sit to Stand: Min guard  Stand pivot transfers: Mod assist  General transfer comment: VC's for hand placement on seated surface for safety.  Ambulation/Gait  Ambulation/Gait assistance: Min guard  Ambulation Distance (Feet): 175 Feet  Assistive device: Rolling walker (2 wheeled)  Gait Pattern/deviations: Step-through pattern;Decreased stride length;Wide base of support  Gait velocity: Decreased  Gait velocity interpretation: Below normal speed for age/gender  General Gait Details: 1 seated rest break required. Pt very fatigued at end of gait training, and was slightly SOB. Pt states she did not have a limit for distance when ambulating prior to admission.   ADL:  ADL  Overall ADL's : Needs assistance/impaired  Grooming: Brushing hair;Supervision/safety;Sitting  Grooming Details (indicate cue type and reason): pt able to untie hair tie from hair ( take down pony tail ) with bil UE  Upper Body Bathing Details (indicate cue type and reason): reports total (A ) from tech just prior to OT arrival  Lower Body Bathing: Total assistance;Bed level  Toilet Transfer: Minimal assistance;+2 for physical assistance;Stand-pivot;Ambulation;RW  Functional mobility during ADLs: Minimal assistance;Rolling walker;+2 for physical assistance  General ADL Comments: PT completed bed mobility adn sitting EOB. pt tolerated static standing ~2 minutes and reports dizziness. pt stand pivot to chair mod (A) for RW (A). pt with uncontrolled descend to chair. Pt requires use of BIL UE for sit <>Stand  Cognition:  Cognition  Overall Cognitive Status: Within Functional Limits for tasks assessed  Orientation Level: Oriented X4  Cognition  Arousal/Alertness: Awake/alert  Behavior During Therapy: WFL for tasks assessed/performed  Overall Cognitive Status: Within Functional Limits for tasks assessed  Area of Impairment: Memory;Orientation  Orientation Level: Time    Memory: Decreased short-term memory   Physical Exam:  Blood pressure 100/45, pulse 96, temperature 98.3 F (36.8 C), temperature source Oral, resp. rate 18, height 5' 4.17" (1.63 m), weight 165.79 kg (365 lb 8 oz), SpO2 94.00%.    Constitutional: She is oriented to person, place, and time. Morbidly obese HENT: dentition fair. Oral mucosa moist Head: Normocephalic.  Eyes: EOM are normal.  Neck: Normal range of motion. Neck supple. No thyromegaly present.  Cardiovascular: Normal rate and regular rhythm.  Respiratory: Effort normal and breath sounds normal. No respiratory distress.  GI: Soft. Bowel sounds are normal. Non-tender     .  Skin:  Staples intact to abdomen  Psychiatric: She has a normal mood and affect.  Neuro: 5/5 Bilat Delt , Bi, tri, HI 3+/5 B HF, 4- KE, 4/5 Ankle DF/PF Sensory intact except for median right hand, phalen's test + RUE DTR's 1+   Results for orders placed during the hospital encounter of 07/18/13 (from the past 48 hour(s))   GLUCOSE, CAPILLARY Status: Abnormal    Collection Time    07/25/13 8:07 AM   Result  Value  Ref Range    Glucose-Capillary  124 (*)  70 - 99 mg/dL    Comment 1  Notify RN    GLUCOSE, CAPILLARY Status: Abnormal    Collection Time    07/25/13 11:53 AM   Result  Value  Ref Range    Glucose-Capillary  115 (*)  70 - 99 mg/dL   BASIC METABOLIC PANEL Status: Abnormal  Collection Time    07/26/13 5:29 AM   Result  Value  Ref Range    Sodium  140  137 - 147 mEq/L    Potassium  2.8 (*)  3.7 - 5.3 mEq/L    Comment:  CRITICAL RESULT CALLED TO, READ BACK BY AND VERIFIED WITH:     T.SPARING RN [id number] 07/26/13 E.GADDY    Chloride  97  96 - 112 mEq/L    CO2  25  19 - 32 mEq/L    Glucose, Bld  110 (*)  70 - 99 mg/dL    BUN  13  6 - 23 mg/dL    Creatinine, Ser  0.78  0.50 - 1.10 mg/dL    Calcium  9.0  8.4 - 10.5 mg/dL    GFR calc non Af Amer  >90  >90 mL/min    GFR calc Af Amer  >90  >90 mL/min    Comment:  (NOTE)     The eGFR has  been calculated using the CKD EPI equation.     This calculation has not been validated in all clinical situations.     eGFR's persistently <90 mL/min signify possible Chronic Kidney     Disease.   TROPONIN I Status: None    Collection Time    07/26/13 5:29 AM   Result  Value  Ref Range    Troponin I  <0.30  <0.30 ng/mL    Comment:      Due to the release kinetics of cTnI,     a negative result within the first hours     of the onset of symptoms does not rule out     myocardial infarction with certainty.     If myocardial infarction is still suspected,     repeat the test at appropriate intervals.    No results found.  Medical Problem List and Plan:  1. Functional deficits secondary to deconditioning/respiratory failure after laparoscopic sleeve gastrectomy 07/14/2013  2. DVT Prophylaxis/Anticoagulation: Subcutaneous heparin. Monitor platelet counts any signs of bleeding  3. Pain Management: Tylenol as needed  4. History of asthma. Continue nebulizer inhalers as directed. Check oxygen saturations every shift  5. Obstructive sleep apnea. Continue CPAP. Patient does not use her CPAP routinely at home secondary to dislike in the mask  6. Neuropsych: This patient is capable of making decisions on her own behalf.  7. CTS RUE: observation for now. May benefit from wrist splint  Post Admission Physician Evaluation:  1. Functional deficits secondary to deconditioning after gastrectomy/respiratory failure. 2. Patient is admitted to receive collaborative, interdisciplinary care between the physiatrist, rehab nursing staff, and therapy team. 3. Patient's level of medical complexity and substantial therapy needs in context of that medical necessity cannot be provided at a lesser intensity of care such as a SNF. 4. Patient has experienced substantial functional loss from his/her baseline which was documented above under the "Functional History" and "Functional Status" headings. Judging by the  patient's diagnosis, physical exam, and functional history, the patient has potential for functional progress which will result in measurable gains while on inpatient rehab. These gains will be of substantial and practical use upon discharge in facilitating mobility and self-care at the household level. 5. Physiatrist will provide 24 hour management of medical needs as well as oversight of the therapy plan/treatment and provide guidance as appropriate regarding the interaction of the two. 6. 24 hour rehab nursing will assist with bladder management, bowel management, safety, skin/wound care, disease management, medication administration,  pain management and patient education and help integrate therapy concepts, techniques,education, etc. 7. PT will assess and treat for/with: Lower extremity strength, range of motion, stamina, balance, functional mobility, safety, adaptive techniques and equipment, pain mgt, skin/wound care. Goals are: mod I. 8. OT will assess and treat for/with: ADL's, functional mobility, safety, upper extremity strength, adaptive techniques and equipment, pain mgt, pt education. Goals are: mod I. 9. SLP will assess and treat for/with: n/a. Goals are: n/a. 10. Case Management and Social Worker will assess and treat for psychological issues and discharge planning. 11. Team conference will be held weekly to assess progress toward goals and to determine barriers to discharge. 12. Patient will receive at least 3 hours of therapy per day at least 5 days per week. 13. ELOS: 7 days  14. Prognosis: excellent  Meredith Staggers, MD, [facility]   07/27/2013

## 2013-07-28 ENCOUNTER — Inpatient Hospital Stay (HOSPITAL_COMMUNITY): Payer: BC Managed Care – PPO | Admitting: Occupational Therapy

## 2013-07-28 ENCOUNTER — Inpatient Hospital Stay (HOSPITAL_COMMUNITY): Payer: BC Managed Care – PPO

## 2013-07-28 LAB — COMPREHENSIVE METABOLIC PANEL
ALT: 86 U/L — ABNORMAL HIGH (ref 0–35)
AST: 37 U/L (ref 0–37)
Albumin: 3.2 g/dL — ABNORMAL LOW (ref 3.5–5.2)
Alkaline Phosphatase: 54 U/L (ref 39–117)
BUN: 11 mg/dL (ref 6–23)
CO2: 23 mEq/L (ref 19–32)
CREATININE: 0.76 mg/dL (ref 0.50–1.10)
Calcium: 9.8 mg/dL (ref 8.4–10.5)
Chloride: 101 mEq/L (ref 96–112)
GFR calc Af Amer: >90 mL/min (ref >90)
GFR calc non Af Amer: >90 mL/min (ref >90)
GLUCOSE: 122 mg/dL — AB (ref 70–99)
Potassium: 3.7 mEq/L (ref 3.7–5.3)
Sodium: 141 mEq/L (ref 137–147)
TOTAL PROTEIN: 8.1 g/dL (ref 6.0–8.3)
Total Bilirubin: 0.5 mg/dL (ref 0.3–1.2)

## 2013-07-28 LAB — CBC WITH DIFFERENTIAL/PLATELET
Basophils Absolute: 0 10*3/uL (ref 0.0–0.1)
Basophils Relative: 0 % (ref 0–1)
Eosinophils Absolute: 0.1 10*3/uL (ref 0.0–0.7)
Eosinophils Relative: 1 % (ref 0–5)
HCT: 41.9 % (ref 36.0–46.0)
Hemoglobin: 13.4 g/dL (ref 12.0–15.0)
LYMPHS ABS: 1.9 10*3/uL (ref 0.7–4.0)
Lymphocytes Relative: 20 % (ref 12–46)
MCH: 27.5 pg (ref 26.0–34.0)
MCHC: 32 g/dL (ref 30.0–36.0)
MCV: 86 fL (ref 78.0–100.0)
MONOS PCT: 9 % (ref 3–12)
Monocytes Absolute: 0.9 10*3/uL (ref 0.1–1.0)
Neutro Abs: 6.7 10*3/uL (ref 1.7–7.7)
Neutrophils Relative %: 70 % (ref 43–77)
Platelets: 196 10*3/uL (ref 150–400)
RBC: 4.87 MIL/uL (ref 3.87–5.11)
RDW: 14.7 % (ref 11.5–15.5)
WBC: 9.6 10*3/uL (ref 4.0–10.5)

## 2013-07-28 MED ORDER — HEPARIN SODIUM (PORCINE) 5000 UNIT/ML IJ SOLN: 5000.0000 [IU] | Freq: Three times a day (TID) | INTRAMUSCULAR | Status: DC

## 2013-07-28 MED ORDER — ADULT MULTIVITAMIN W/MINERALS CH
1.0000 | ORAL_TABLET | Freq: Two times a day (BID) | ORAL | Status: DC
  Administered 2013-07-28: 1 via ORAL
  Filled 2013-07-28 (×3): qty 1

## 2013-07-28 MED ORDER — VITAMIN B-12 1000 MCG PO TABS
1000.0000 ug | ORAL_TABLET | Freq: Every day | ORAL | Status: DC
  Administered 2013-07-28 – 2013-08-04 (×8): 1000 ug via ORAL
  Filled 2013-07-28 (×10): qty 1

## 2013-07-28 MED ORDER — HYDROCORTISONE 1 % EX CREA: TOPICAL_CREAM | Freq: Three times a day (TID) | CUTANEOUS | Status: DC | PRN

## 2013-07-28 MED ORDER — PANTOPRAZOLE SODIUM 40 MG PO TBEC
40.0000 mg | DELAYED_RELEASE_TABLET | Freq: Every day | ORAL | Status: DC
  Administered 2013-07-28 – 2013-08-03 (×7): 40 mg via ORAL
  Filled 2013-07-28 (×9): qty 1

## 2013-07-28 MED ORDER — WHITE PETROLATUM GEL
Status: AC
  Administered 2013-07-28: 0.2 via TOPICAL
  Filled 2013-07-28: qty 5

## 2013-07-28 MED ORDER — SORBITOL 70 % SOLN: 30.0000 mL | Freq: Every day | Status: DC | PRN

## 2013-07-28 MED ORDER — CALCIUM CARBONATE 1250 (500 CA) MG PO TABS
1250.0000 mg | ORAL_TABLET | Freq: Every day | ORAL | Status: DC
  Administered 2013-07-28: 1250 mg via ORAL
  Filled 2013-07-28 (×2): qty 1

## 2013-07-28 MED ORDER — UNJURY CHOCOLATE CLASSIC POWDER
2.0000 [oz_av] | Freq: Four times a day (QID) | ORAL | Status: DC
  Administered 2013-07-28 – 2013-08-02 (×8): 2 [oz_av] via ORAL
  Filled 2013-07-28 (×25): qty 27

## 2013-07-28 MED ORDER — CALCIUM CITRATE-VITAMIN D 500-400 MG-UNIT PO CHEW
1.0000 | CHEWABLE_TABLET | Freq: Three times a day (TID) | ORAL | Status: DC
  Filled 2013-07-28 (×4): qty 1

## 2013-07-28 MED ORDER — ALBUTEROL SULFATE (2.5 MG/3ML) 0.083% IN NEBU
2.5000 mg | INHALATION_SOLUTION | Freq: Two times a day (BID) | RESPIRATORY_TRACT | Status: DC
  Administered 2013-07-29 – 2013-08-03 (×11): 2.5 mg via RESPIRATORY_TRACT
  Filled 2013-07-28 (×14): qty 3

## 2013-07-28 MED ORDER — ACETAMINOPHEN 325 MG PO TABS
325.0000 mg | ORAL_TABLET | ORAL | Status: DC | PRN
  Administered 2013-07-28 – 2013-08-03 (×7): 650 mg via ORAL
  Filled 2013-07-28 (×7): qty 2

## 2013-07-28 MED ORDER — WHITE PETROLATUM GEL
Status: DC | PRN
  Administered 2013-07-28: 0.2 via TOPICAL

## 2013-07-28 MED ORDER — ONDANSETRON HCL 4 MG/2ML IJ SOLN
4.0000 mg | Freq: Four times a day (QID) | INTRAMUSCULAR | Status: DC | PRN
  Administered 2013-07-30 – 2013-08-02 (×4): 4 mg via INTRAVENOUS
  Filled 2013-07-28 (×4): qty 2

## 2013-07-28 MED ORDER — ALBUTEROL SULFATE (2.5 MG/3ML) 0.083% IN NEBU: 2.5000 mg | INHALATION_SOLUTION | RESPIRATORY_TRACT | Status: DC | PRN

## 2013-07-28 MED ORDER — BENEPROTEIN PO POWD
1.0000 | Freq: Four times a day (QID) | ORAL | Status: DC
  Administered 2013-07-28: 6 g via ORAL
  Filled 2013-07-28: qty 227

## 2013-07-28 MED ORDER — RESOURCE THICKENUP CLEAR PO POWD: ORAL | Status: DC | PRN

## 2013-07-28 MED ORDER — HEPARIN SODIUM (PORCINE) 5000 UNIT/ML IJ SOLN
5000.0000 [IU] | Freq: Three times a day (TID) | INTRAMUSCULAR | Status: DC
  Administered 2013-07-28 – 2013-08-04 (×22): 5000 [IU] via SUBCUTANEOUS
  Filled 2013-07-28 (×25): qty 1

## 2013-07-28 MED ORDER — NON FORMULARY: 1.0000 | Freq: Two times a day (BID) | Status: DC

## 2013-07-28 MED ORDER — ONDANSETRON HCL 4 MG PO TABS
4.0000 mg | ORAL_TABLET | Freq: Four times a day (QID) | ORAL | Status: DC | PRN
  Administered 2013-07-30 – 2013-08-04 (×3): 4 mg via ORAL
  Filled 2013-07-28 (×4): qty 1

## 2013-07-28 MED ORDER — CENTRUM PO CHEW
1.0000 | CHEWABLE_TABLET | Freq: Two times a day (BID) | ORAL | Status: DC
  Administered 2013-07-29 – 2013-08-02 (×6): 1 via ORAL
  Filled 2013-07-28 (×15): qty 1

## 2013-07-28 MED ORDER — CALCIUM CITRATE-VITAMIN D 500-400 MG-UNIT PO CHEW
1.0000 | CHEWABLE_TABLET | Freq: Three times a day (TID) | ORAL | Status: DC
  Administered 2013-07-28 – 2013-08-02 (×9): 1 via ORAL
  Filled 2013-07-28 (×24): qty 1

## 2013-07-28 NOTE — Progress Notes (Signed)
  [place] PHYSICAL MEDICINE & REHABILITATION     PROGRESS NOTE    Subjective/Complaints: Had a reasonable night. Slept well.  A  review of systems has been performed and if not noted above is otherwise negative.   Objective: Vital Signs: Blood pressure 149/78, pulse 92, temperature 98.4 F (36.9 C), temperature source Oral, resp. rate 17, height 5\' 5"  (1.651 m), weight 165.608 kg (365 lb 1.6 oz), SpO2 96.00%. No results found. No results found for this basename: WBC, HGB, HCT, PLT,  in the last 72 hours  Recent Labs  07/26/13 0529 07/27/13 1200  NA 140 138  K 2.8* 3.6*  CL 97 97  GLUCOSE 110* 104*  BUN 13 11  CREATININE 0.78 0.76  CALCIUM 9.0 9.3   CBG (last 3)   Recent Labs  07/25/13 0807 07/25/13 1153  GLUCAP 124* 115*    Wt Readings from Last 3 Encounters:  07/27/13 165.608 kg (365 lb 1.6 oz)  07/26/13 165.79 kg (365 lb 8 oz)  07/26/13 165.79 kg (365 lb 8 oz)    Physical Exam:  Constitutional: She is oriented to person, place, and time. Morbidly obese  HENT: dentition fair. Oral mucosa moist  Head: Normocephalic.  Eyes: EOM are normal.  Neck: Normal range of motion. Neck supple. No thyromegaly present.  Cardiovascular: Normal rate and regular rhythm.  Respiratory: Effort normal and breath sounds normal. No respiratory distress.  GI: Soft. Bowel sounds are normal. Minimally tender Skin:  Staples intact to abdomen  Psychiatric: She has a normal mood and affect.  Neuro:  5/5 Bilat Delt , Bi, tri, HI  3+/5 B HF, 4- KE, 4/5 Ankle DF/PF  Sensory intact except for median right hand, phalen's test + RUE  DTR's 1+    Assessment/Plan: 1. Functional deficits secondary to deconditioning after gastrectomy/respiratory failure which require 3+ hours per day of interdisciplinary therapy in a comprehensive inpatient rehab setting. Physiatrist is providing close team supervision and 24 hour management of active medical problems listed below. Physiatrist and rehab  team continue to assess barriers to discharge/monitor patient progress toward functional and medical goals. FIM:       FIM - Toileting Toileting: 7: Independent: No helper, no device  FIM - Air cabin crew Transfers: 7-Independent: No helper        Comprehension Comprehension Mode: Auditory Comprehension: 7-Follows complex conversation/direction: With no assist  Expression Expression Mode: Verbal Expression: 7-Expresses complex ideas: With no assist  Social Interaction Social Interaction: 7-Interacts appropriately with others - No medications needed.  Problem Solving Problem Solving: 7-Solves complex problems: Recognizes & self-corrects  Memory Memory: 7-Complete Independence: No helper  Medical Problem List and Plan:  1. Functional deficits secondary to deconditioning/respiratory failure after laparoscopic sleeve gastrectomy 07/14/2013  2. DVT Prophylaxis/Anticoagulation: Subcutaneous heparin. Monitor platelet counts any signs of bleeding  3. Pain Management: Tylenol as needed  4. History of asthma. Continue nebulizer inhalers as directed. Check oxygen saturations every shift  5. Obstructive sleep apnea. Continue CPAP. Patient does not use her CPAP routinely at home secondary to dislike in the mask  6. Neuropsych: This patient is capable of making decisions on her own behalf.  7. CTS RUE: ---symptoms slightly improved  -?wrist splint--hold on for now  LOS (Days) 1 A FACE TO FACE EVALUATION WAS PERFORMED  Kailiana Granquist T 07/28/2013 7:08 AM

## 2013-07-28 NOTE — Evaluation (Signed)
Physical Therapy Assessment and Plan  Patient Details  Name: Melissa Erickson MRN: [id number] Date of Birth: 1980/12/27  PT Diagnosis: Cognitive deficits and Difficulty walking; Decreased functional endurance; Decreased balance Rehab Potential: Good ELOS: 7-10days   Today's Date: 07/28/2013 Time: 0815-0900 Time Calculation (min): 45 min  Problem List:  Patient Active Problem List   Diagnosis Date Noted  . Physical deconditioning 07/28/2013  . Acute respiratory failure 07/20/2013  . NSTEMI (non-ST elevated myocardial infarction), type 2 secondary to demand ischemia sue to sepsis 07/20/2013  . Morbid obesity 07/18/2013  . ARDS (adult respiratory distress syndrome) 07/18/2013  . Hypertension 07/18/2013  . CHF (congestive heart failure) 07/18/2013    Past Medical History:  Past Medical History  Diagnosis Date  . Polycystic ovarian syndrome   . Asthma   . Migraine    Past Surgical History:  Past Surgical History  Procedure Laterality Date  . Cholecystectomy    . Cesarean section    . Laparoscopic gastric sleeve resection    . Laparoscopy N/A 07/19/2013    Procedure: LAPAROSCOPY DIAGNOSTIC;  Surgeon: Harl Bowie, MD;  Location: [facility];  Service: General;  Laterality: N/A;  . Esophagogastroduodenoscopy N/A 07/19/2013    Procedure: ESOPHAGOGASTRODUODENOSCOPY (EGD);  Surgeon: Harl Bowie, MD;  Location: [facility];  Service: General;  Laterality: N/A;    Assessment & Plan Clinical Impression: Melissa Erickson is a 33 y.o. right-handed female with history of morbid obesity, hypertension, OSA, asthma. Patient independent prior to admission. Patient recently underwent laparoscopic vertical sleeve gastrectomy 07/14/2013 in [facility]. She was discharged to home the following day and subsequently admitted to the PCCM. service 07/18/2013 after presenting with acute hypoxic failure requiring intubation shortly after arrival. CT abdomen pelvis and chest showed no  pulmonary embolus. Multilobar groundglass and consolidation indicated of edema versus pneumonia. Placed on broad-spectrum antibiotics and since been completed. Venous Doppler studies lower extremities negative for DVT. Followup per critical care pulmonary services. Echocardiogram with ejection fraction 55% mild LVH grade 1 diastolic dysfunction. Patient extubated 07/22/2013. Mild elevation troponin followup cardiology services and felt to be demand ischemia and monitored with no further plan for cardiac evaluation. Maintained on subcutaneous heparin for DVT prophylaxis. Patient continues on a bariatric diet with nectar liquids. Physical and occupational therapy evaluations completed 07/23/2013 with recommendations for physical medicine rehabilitation consult. Patient was admitted for comprehensive rehabilitation program. Patient transferred to CIR on 07/27/2013 .   Patient currently requires close(S)-min A with mobility secondary to decreased cardiorespiratoy endurance, decreased problem solving and decreased standing balance.  Prior to hospitalization, patient was independent  with mobility and lived with   in a [place] home.  Home access is  Level entry.  Patient will benefit from skilled PT intervention to maximize safe functional mobility, minimize fall risk and decrease caregiver burden for planned discharge home with intermittent assist.  Anticipate patient will benefit from follow up OP at discharge.  PT - End of Session Activity Tolerance: Tolerates 30+ min activity with multiple rests Endurance Deficit: Yes Endurance Deficit Description: Pt demonstrates significantly decreased functional endurance during standing functional tasks req prolonged seated rest breaks. PT Assessment Rehab Potential: Good PT Patient demonstrates impairments in the following area(s): Balance;Endurance;Safety;Pain;Skin Integrity PT Transfers Functional Problem(s): Bed to Chair;Car;Furniture PT Locomotion Functional  Problem(s): Stairs;Ambulation PT Plan PT Intensity: Minimum of 1-2 x/day ,45 to 90 minutes PT Frequency: 5 out of 7 days PT Duration Estimated Length of Stay: 7-10days PT Treatment/Interventions: Ambulation/gait training;Community reintegration;DME/adaptive equipment instruction;Psychosocial support;Stair training;UE/LE  Strength taining/ROM;Wheelchair propulsion/positioning;UE/LE Coordination activities;Therapeutic Activities;Discharge planning;Balance/vestibular training;Disease management/prevention;Functional mobility training;Patient/family education;Therapeutic Exercise;Neuromuscular re-education;Pain management;Skin care/wound management;Cognitive remediation/compensation PT Transfers Anticipated Outcome(s): (S)-mod(I) PT Locomotion Anticipated Outcome(s): (S)-Mod(I) PT Recommendation Follow Up Recommendations: Outpatient PT Patient destination: Home Equipment Recommended: To be determined  Skilled Therapeutic Intervention 1:1. Pt received supine in bed, ready for therapy. Missed 29mn at start of session due to need for nursing care. PT evaluation completed, see detailed objective information below. Pt verbalized understanding regarding education on rehab environment, role of therapies and general safety plan. Tx initiated w/ emphasis on functional transfers, gait training and functional endurance. With cueing pt utilized log roll technique for t/f sup>sit EOB w/ min guard A and use of bed rail. Pt initially utilized RW, transitioned to rollator for decreased resistance but continued B UE support during ambulation 100'x2 w/ initial min guard decreasing to close(S) and cues for managing brakes on rollator. Pt able to negotiate up/down 5 steps w/ B rail and min guard A. Pt req seated rest throughout session due to fatigue. Transported back to room at end of session via w/c. No SOB or dizziness noted throughout session. Pt sitting in w/c at end of session w/ all needs in reach.   PT  Evaluation Precautions/Restrictions Precautions Precautions: Fall Restrictions Weight Bearing Restrictions: No General Amount of Missed PT Time (min): 15 Minutes Missed Time Reason: Nursing care Vital SignsTherapy Vitals Pulse Rate: 130 Oxygen Therapy SpO2: 84 % O2 Device: None (Room air) Pain Pain Assessment Pain Assessment: No/denies pain Home Living/Prior Functioning Home Living Available Help at Discharge: Family;Friend(s);Available 24 hours/day Type of Home: Apartment Home Access: Level entry Home Layout: One level Vision/Perception     Cognition Overall Cognitive Status: Within Functional Limits for tasks assessed Arousal/Alertness: Awake/alert Orientation Level: Oriented X4 Attention: Selective Selective Attention: Appears intact Memory: Appears intact Awareness: Appears intact Problem Solving: Impaired Problem Solving Impairment: Functional basic;Functional complex Safety/Judgment: Appears intact Comments: appears to be anxious during the session  Sensation Sensation Light Touch: Appears Intact Proprioception: Appears Intact Coordination Gross Motor Movements are Fluid and Coordinated: Yes Fine Motor Movements are Fluid and Coordinated: Yes Motor  Motor Motor: Other (comment) Motor - Skilled Clinical Observations: Generalized weakness  Mobility Bed Mobility Bed Mobility: Supine to Sit Supine to Sit: HOB flat;With rails;5: Supervision Supine to Sit Details: Verbal cues for technique Supine to Sit Details (indicate cue type and reason): Pt instructed on log roll technique Transfers Transfers: Yes Sit to Stand: 5: Supervision Sit to Stand Details: Verbal cues for safe use of DME/AE Sit to Stand Details (indicate cue type and reason): cues to lock brakes on rollator Stand to Sit: 5: Supervision Stand to Sit Details (indicate cue type and reason): Verbal cues for safe use of DME/AE Stand to Sit Details: cues to lock brakes on rollator Stand Pivot  Transfers: 4: Min guard;5: Supervision Stand Pivot Transfer Details: Verbal cues for safe use of DME/AE Locomotion  Ambulation Ambulation: Yes Ambulation/Gait Assistance: 4: Min guard;5: Supervision Ambulation Distance (Feet): 100 Feet Assistive device: 4-wheeled walker;Rolling walker Ambulation/Gait Assistance Details: Verbal cues for safe use of DME/AE Ambulation/Gait Assistance Details: initial 141 w/ RW, transition to rollator for remaining 100' Gait Gait: Yes Gait Pattern: Within Functional Limits Gait velocity: Decreased Stairs / Additional Locomotion Stairs: Yes Stairs Assistance: 4: Min guard Stair Management Technique: Two rails;Step to pattern;Forwards Number of Stairs: 5 Wheelchair Mobility Wheelchair Mobility: No Distance: Pt demonstrating difficulty propelling w/c, B LE do not touch floor and nails too long to use B  UE  Trunk/Postural Assessment  Cervical Assessment Cervical Assessment: Within Functional Limits Thoracic Assessment Thoracic Assessment: Within Functional Limits Lumbar Assessment Lumbar Assessment: Within Functional Limits Postural Control Postural Control: Within Functional Limits  Balance Balance Balance Assessed: Yes Static Sitting Balance Static Sitting - Balance Support: No upper extremity supported;Feet supported Static Sitting - Level of Assistance: 7: Independent Dynamic Sitting Balance Dynamic Sitting - Balance Support: Right upper extremity supported;Left upper extremity supported;Feet supported Dynamic Sitting - Level of Assistance: 6: Modified independent (Device/Increase time) Dynamic Sitting - Balance Activities: Lateral lean/weight shifting;Forward lean/weight shifting;Reaching for objects Static Standing Balance Static Standing - Balance Support: No upper extremity supported;Bilateral upper extremity supported Static Standing - Level of Assistance: 5: Stand by assistance Dynamic Standing Balance Dynamic Standing - Balance  Support: Left upper extremity supported;Bilateral upper extremity supported;Right upper extremity supported Dynamic Standing - Level of Assistance: 5: Stand by assistance;4: Min assist Dynamic Standing - Balance Activities: Lateral lean/weight shifting;Forward lean/weight shifting;Reaching for objects Extremity Assessment  RUE Assessment RUE Assessment: Within Functional Limits LUE Assessment LUE Assessment: Within Functional Limits RLE Assessment RLE Assessment: Within Functional Limits LLE Assessment LLE Assessment: Within Functional Limits  FIM:  FIM - Bed/Chair Transfer Bed/Chair Transfer Assistive Devices: Bed rails Bed/Chair Transfer: 4: Supine > Sit: Min A (steadying Pt. > 75%/lift 1 leg);4: Bed > Chair or W/C: Min A (steadying Pt. > 75%);4: Chair or W/C > Bed: Min A (steadying Pt. > 75%) FIM - Locomotion: Wheelchair Distance: Pt demonstrating difficulty propelling w/c, B LE do not touch floor and nails too long to use B UE Locomotion: Wheelchair: 1: Total Assistance/staff pushes wheelchair (Pt<25%) FIM - Locomotion: Ambulation Locomotion: Ambulation Assistive Devices: Walker - Rolling;Other (comment) (rollator) Ambulation/Gait Assistance: 4: Min guard;5: Supervision Locomotion: Ambulation: 2: Travels 50 - 149 ft with minimal assistance (Pt.>75%) FIM - Locomotion: Stairs Locomotion: Scientist, physiological: Hand rail - 2 Locomotion: Stairs: 2: Up and Down 4 - 11 stairs with minimal assistance (Pt.>75%)   Refer to Care Plan for Long Term Goals  Recommendations for other services: None  Discharge Criteria: Patient will be discharged from PT if patient refuses treatment 3 consecutive times without medical reason, if treatment goals not met, if there is a change in medical status, if patient makes no progress towards goals or if patient is discharged from hospital.  The above assessment, treatment plan, treatment alternatives and goals were discussed and mutually agreed upon: by  patient  Melissa Erickson 07/28/2013, 12:22 PM

## 2013-07-28 NOTE — Evaluation (Signed)
Occupational Therapy Assessment and Plan  Patient Details  Name: Melissa Erickson MRN: [id number] Date of Birth: [birth year]  OT Diagnosis: muscle weakness (generalized) Rehab Potential: Rehab Potential: Good ELOS: 7-10 days   Today's Date: 07/28/2013 Time: 0815-0900 Time Calculation (min): 45 min  Problem List:  Patient Active Problem List   Diagnosis Date Noted  . Physical deconditioning 07/28/2013  . Acute respiratory failure 07/20/2013  . NSTEMI (non-ST elevated myocardial infarction), type 2 secondary to demand ischemia sue to sepsis 07/20/2013  . Morbid obesity 07/18/2013  . ARDS (adult respiratory distress syndrome) 07/18/2013  . Hypertension 07/18/2013  . CHF (congestive heart failure) 07/18/2013    Past Medical History:  Past Medical History  Diagnosis Date  . Polycystic ovarian syndrome   . Asthma   . Migraine    Past Surgical History:  Past Surgical History  Procedure Laterality Date  . Cholecystectomy    . Cesarean section    . Laparoscopic gastric sleeve resection    . Laparoscopy N/A 07/19/2013    Procedure: LAPAROSCOPY DIAGNOSTIC;  Surgeon: Harl Bowie, MD;  Location: [facility];  Service: General;  Laterality: N/A;  . Esophagogastroduodenoscopy N/A 07/19/2013    Procedure: ESOPHAGOGASTRODUODENOSCOPY (EGD);  Surgeon: Harl Bowie, MD;  Location: [facility];  Service: General;  Laterality: N/A;    Assessment & Plan Clinical Impression: Patient is a 33 y.o. year old female right-handed female with history of morbid obesity, hypertension, OSA, asthma. Patient independent prior to admission. Patient recently underwent laparoscopic vertical sleeve gastrectomy 07/14/2013 in [facility]. She was discharged to home the following day and subsequently admitted to the PCCM. service 07/18/2013 after presenting with acute hypoxic failure requiring intubation shortly after arrival. CT abdomen pelvis and chest showed no pulmonary embolus. Multilobar groundglass  and consolidation indicated of edema versus pneumonia. Placed on broad-spectrum antibiotics and since been completed. Venous Doppler studies lower extremities negative for DVT. Followup per critical care pulmonary services. Echocardiogram with ejection fraction 55% mild LVH grade 1 diastolic dysfunction. Patient extubated 07/22/2013. Mild elevation troponin followup cardiology services and felt to be demand ischemia and monitored with no further plan for cardiac evaluation. Maintained on subcutaneous heparin for DVT prophylaxis. Patient continues on a bariatric diet with nectar liquids.   Patient transferred to CIR on 07/27/2013 .    Patient currently requires min with basic self-care skills and basic mobility secondary to muscle weakness, decreased cardiorespiratoy endurance and decreased standing balance and decreased activity tolerance .  Prior to hospitalization, patient could complete ADL with independent .  Patient will benefit from skilled intervention to decrease level of assist with basic self-care skills and increase independence with basic self-care skills prior to discharge home with care partner.    OT - End of Session Activity Tolerance: Tolerates 10 - 20 min activity with multiple rests Endurance Deficit: Yes Endurance Deficit Description: Pt demonstrates significantly decreased functional endurance during standing functional tasks req prolonged seated rest breaks. OT Assessment Rehab Potential: Good OT Patient demonstrates impairments in the following area(s): Balance;Cognition;Edema;Endurance;Motor;Pain;Safety OT Basic ADL's Functional Problem(s): Bathing;Dressing;Toileting OT Advanced ADL's Functional Problem(s): Simple Meal Preparation OT Transfers Functional Problem(s): Toilet;Tub/Shower OT Additional Impairment(s): None OT Plan OT Intensity: Minimum of 1-2 x/day, 45 to 90 minutes OT Frequency: 5 out of 7 days OT Duration/Estimated Length of Stay: 7-10 days OT  Treatment/Interventions: Balance/vestibular training;Cognitive remediation/compensation;Discharge planning;DME/adaptive equipment instruction;Functional mobility training;Community reintegration;Psychosocial support;Therapeutic Activities;UE/LE Strength taining/ROM;Therapeutic Exercise;UE/LE Coordination activities;Patient/family education;Self Care/advanced ADL retraining OT Self Feeding Anticipated Outcome(s): mod I  OT  Basic Self-Care Anticipated Outcome(s): mod I  OT Toileting Anticipated Outcome(s): mod I  OT Bathroom Transfers Anticipated Outcome(s): mod I  OT Recommendation Patient destination: Home Follow Up Recommendations: None Equipment Recommended: Tub/shower bench   Skilled Therapeutic Intervention OT evaluation with OT purpose, role and goals discussed. Self care retraining at shower level with focus on functional ambulation with Rollator, sit to stands, standing balance, activity tolerance/ endurance with functional tasks. Pt with decreased activity tolerance and reported some anxiousness with activity today. Pt does not report any cognitive changes since event however did present with decreased processing and problem solving with increased fatigue. Pt did require A with pericare after a BM and assistance with don and doffing socks. Reports she could do this before her sx.  OT Evaluation Precautions/Restrictions  Precautions Precautions: Fall Restrictions Weight Bearing Restrictions: No General Chart Reviewed: Yes Missed Time Reason: Nursing care Family/Caregiver Present: No Vital Signs Therapy Vitals Pulse Rate: 130 Oxygen Therapy SpO2: 84 % O2 Device: None (Room air) Pain Pain Assessment Pain Assessment: No/denies pain Home Living/Prior Functioning Home Living Available Help at Discharge: Family;Friend(s);Available 24 hours/day Type of Home: Apartment Home Access: Level entry Home Layout: One level Prior Function Level of Independence: Independent with basic  ADLs;Independent with gait;Independent with transfers;Independent with homemaking with ambulation  Able to Take Stairs?: Yes Driving: Yes Vocation: Full time employment Comments: Pt has an 35yo dgtr and states god-mother can assist her some at [place]. 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 ADL  see FIM Vision/Perception  Vision- History Baseline Vision/History: No visual deficits Patient Visual Report: No change from baseline Vision- Assessment Vision Assessment?: No apparent visual deficits  Cognition Overall Cognitive Status: Within Functional Limits for tasks assessed Arousal/Alertness: Awake/alert Orientation Level: Oriented X4 Attention: Selective Selective Attention: Appears intact Memory: Appears intact Awareness: Appears intact Problem Solving: Impaired Problem Solving Impairment: Functional basic;Functional complex Safety/Judgment: Appears intact Comments: appears to be anxious during the session  Sensation Sensation Light Touch: Appears Intact Proprioception: Appears Intact Coordination Gross Motor Movements are Fluid and Coordinated: Yes Fine Motor Movements are Fluid and Coordinated: Yes Motor  Motor Motor: Other (comment) Motor - Skilled Clinical Observations: Generalized weakness Mobility  Bed Mobility Bed Mobility: Supine to Sit Supine to Sit: HOB flat;With rails;5: Supervision Supine to Sit Details: Verbal cues for technique Supine to Sit Details (indicate cue type and reason): Pt instructed on log roll technique Transfers Sit to Stand: 5: Supervision Sit to Stand Details: Verbal cues for safe use of DME/AE Sit to Stand Details (indicate cue type and reason): cues to lock brakes on rollator Stand to Sit: 5: Supervision Stand to Sit Details (indicate cue type and reason): Verbal cues for safe use of DME/AE Stand to Sit Details: cues to lock brakes on rollator  Trunk/Postural Assessment  Cervical Assessment Cervical Assessment:  Within Functional Limits Thoracic Assessment Thoracic Assessment: Within Functional Limits Lumbar Assessment Lumbar Assessment: Within Functional Limits Postural Control Postural Control: Within Functional Limits  Balance Balance Balance Assessed: Yes Static Sitting Balance Static Sitting - Balance Support: No upper extremity supported;Feet supported Static Sitting - Level of Assistance: 7: Independent Dynamic Sitting Balance Dynamic Sitting - Balance Support: Right upper extremity supported;Left upper extremity supported;Feet supported Dynamic Sitting - Level of Assistance: 6: Modified independent (Device/Increase time) Dynamic Sitting - Balance Activities: Lateral lean/weight shifting;Forward lean/weight shifting;Reaching for objects Static Standing Balance Static Standing - Balance Support: No upper extremity supported;Bilateral upper extremity supported Static Standing - Level of Assistance: 5:  Stand by assistance Dynamic Standing Balance Dynamic Standing - Balance Support: Left upper extremity supported;Bilateral upper extremity supported;Right upper extremity supported Dynamic Standing - Level of Assistance: 5: Stand by assistance;4: Min assist Dynamic Standing - Balance Activities: Lateral lean/weight shifting;Forward lean/weight shifting;Reaching for objects Extremity/Trunk Assessment RUE Assessment RUE Assessment: Within Functional Limits LUE Assessment LUE Assessment: Within Functional Limits  FIM:  FIM - Bed/Chair Transfer Bed/Chair Transfer Assistive Devices: Bed rails Bed/Chair Transfer: 4: Supine > Sit: Min A (steadying Pt. > 75%/lift 1 leg);4: Bed > Chair or W/C: Min A (steadying Pt. > 75%);4: Chair or W/C > Bed: Min A (steadying Pt. > 75%)   Refer to Care Plan for Long Term Goals  Recommendations for other services: None  Discharge Criteria: Patient will be discharged from OT if patient refuses treatment 3 consecutive times without medical reason, if treatment  goals not met, if there is a change in medical status, if patient makes no progress towards goals or if patient is discharged from hospital.  The above assessment, treatment plan, treatment alternatives and goals were discussed and mutually agreed upon: by patient  Nicoletta Ba 07/28/2013, 12:00 PM

## 2013-07-28 NOTE — Progress Notes (Signed)
Physical Therapy Session Note  Patient Details  Name: Melissa Erickson MRN: [id number] Date of Birth: May 26, 1980  Today's Date: 07/28/2013 Time: 7425-9563 Time Calculation (min): 30 min  Short Term Goals: Week 1:  PT Short Term Goal 1 (Week 1): STGs=LTGs due to anticipated LOS  Skilled Therapeutic Interventions/Progress Updates:  1:1. Pt received semi-reclined in bed w/ RN present addressing medication. Pt missed 47min at start of session due to nursing needs. Focus this session on functional endurance and balance. Pt amb room>therapy apartment w/ 1 standing rest break and therapy apartment>room w/ 1 seated rest break, overall (S). In therapy apartment, pt practiced making bed w/out use of rollator. Cues to complete parts of task as able in sitting for energy conservation, such as changing pillow cases. Pt req close(S) for household mobility w/out use of rollator. SaO2 >95% throughout session, max HR 125- encouraged to take seated rest and resolved to 90s. Pt sitting in w/c at end of session w/ all needs in reach.   Therapy Documentation Precautions:  Precautions Precautions: Fall Restrictions Weight Bearing Restrictions: No General: Amount of Missed PT Time (min): 15 Minutes Missed Time Reason: Nursing care  See FIM for current functional status  Therapy/Group: Individual Therapy  Gilmore Laroche 07/28/2013, 4:44 PM

## 2013-07-28 NOTE — Progress Notes (Signed)
Occupational Therapy Note  Patient Details  Name: Melissa Erickson MRN: [id number] Date of Birth: 1980/07/04 Today's Date: 07/28/2013  Time: 1330-1400 Pt denied pain Individual therapy  Pt resting in bed upon arrival but agreeable to participating in therapy.  Pt sat EOB and amb with Rollator to w/c with supervision.  Pt transitioned to ADL apartment and educated on use of tub bench for showering.  Discussed with patient that recommendation would be made at time of discharge.  Pt amb with Rollator to kitchen to practice home mgmt tasks in kitchen.  Pt amb from ADL to room requiring 3 sitting rest breaks and returned to bed to rest before next therapy.  Pt's O2 sats >90% throughout sessin.  Focus on activity tolerance, safety awareness, functional amb with RW, home mgmt, and discharge planning.   [name] [facility] 07/28/2013, 2:48 PM

## 2013-07-28 NOTE — Progress Notes (Signed)
INITIAL NUTRITION ASSESSMENT  DOCUMENTATION CODES Per approved criteria  -Morbid Obesity   INTERVENTION: Continue Bariatric Advanced diet - pt will require this diet for up to 2 months s/p surgery. Upgrade to thin liquids - discussed with PA-C, Dan Angiuilli. No straws or carbonation. Discontinue Thicken-Up. Change multivitamin to chewable BID with meals. Add Unjury nutrition supplement QID. Allow pt to bring in unflavored Unjury from home if desired. Change calcium supplement to chewable calcium citrate with vitamin D 500 mg TID (2 hours s/p MVI dosage) Consider sublingual form of vitamin B12 - discussed with rehab PA-C. Pt may also need chewable forms of other medications to maximize absorption. RD to continue to follow nutrition care plan.  NUTRITION DIAGNOSIS: Altered GI function related to recent sleeve gastrectomy AEB specialized micronutrient and macronutrient needs.  Goal: Tolerate post gastric bypass diet without difficulty.  Monitor:  PO intake, labs, weight trends, supplement tolerance  Reason for Assessment: MD Consult  33 y.o. female  Admitting Dx: deconditioning s/p VDRF  ASSESSMENT: 33 year old morbid obese female with PMH relevant for HTN, OSA, asthma. Underwent laparoscopic vertical sleeve gastrectomy on 07/14/13 Baldo Ash). Pt required intubation. Chest X ray with bilateral diffuse patchy infiltrates compatible with ARDS vs cardiogenic pulmonary edema.   Patient was seen by RD staff during acute hospitalization. Per chart, diet has been advanced to bariatric advanced (pureed foods). Patient reports tolerating her diet but only eating 50%. On 6/10, pt was provided with handouts from the [facility] for post-bariatric surgery patients 10-14 days up to 2 months. Provided information on high protein foods (to get at least 60 grams each day) and to be sure to consume at least 64 ounces of fluid each day. Explained to pt the vitamins she  should be taking and patient was already aware of these. Encouraged pt to chew her foods thoroughly. Gave contact into for pt to follow up with [facility] for when pt is discharged.   Pt currently ordered for thickened liquids. Discussed with rehab PA - Dan Angiulli. Per PA, this is not indicated and pt can have thin liquids. RD upgraded accordingly. Discussed need for chewable forms of medications with PA-C as well.  Pt ordered for Beneprotein and is taking, but would prefer Unjury. RD ordered.  CBG's: 115 - 124 Potassium low at 3.6 Phosphorus and Magnesium WNL  Height: Ht Readings from Last 1 Encounters:  07/27/13 5\' 5"  (1.651 m)    Weight: Wt Readings from Last 1 Encounters:  07/27/13 365 lb 1.6 oz (165.608 kg)    Ideal Body Weight: 56.8 kg   % Ideal Body Weight: 292%  Wt Readings from Last 10 Encounters:  07/27/13 365 lb 1.6 oz (165.608 kg)  07/26/13 365 lb 8 oz (165.79 kg)  07/26/13 365 lb 8 oz (165.79 kg)  06/07/13 402 lb (182.346 kg)  03/06/13 390 lb (176.903 kg)  08/30/12 300 lb (136.079 kg)    Usual Body Weight: 400's  % Usual Body Weight: within range  BMI:  Body mass index is 60.76 kg/(m^2). Obese Class III  Estimated Nutritional Needs: Kcal: 1400 - 1600 kcal Protein: >/= 114 grams Fluid: at least 2.2 L/day  Skin: abdominal incision  Diet Order:   Bariatric Advanced  EDUCATION NEEDS: -No education needs identified at this time   Intake/Output Summary (Last 24 hours) at 07/28/13 0957 Last data filed at 07/27/13 1900  Gross per 24 hour  Intake    480 ml  Output  400 ml  Net     80 ml    Last BM: 6/9  Labs:   Recent Labs Lab 07/23/13 0530 07/24/13 0445 07/24/13 1430 07/25/13 0545 07/26/13 0529 07/27/13 1200  NA 135* 141  --  141 140 138  K 3.1* 2.9* 3.3* 3.1* 2.8* 3.6*  CL 93* 98  --  100 97 97  CO2 26 27  --  23 25 25   BUN 23 19  --  16 13 11   CREATININE 0.83 0.75  --  0.79 0.78 0.76  CALCIUM 9.3  9.1  --  9.1 9.0 9.3  MG 2.0 2.2 2.3 2.2  --   --   PHOS 3.0 2.6  --  3.3  --   --   GLUCOSE 129* 132*  --  117* 110* 104*    CBG (last 3)   Recent Labs  07/25/13 1153  GLUCAP 115*    Scheduled Meds: . albuterol  2.5 mg Nebulization BID  . budesonide (PULMICORT) nebulizer solution  0.25 mg Nebulization BID  . calcium citrate-vitamin D  1 tablet Oral TID  . heparin  5,000 Units Subcutaneous 3 times per day  . mometasone-formoterol  2 puff Inhalation BID  . NON FORMULARY 1 tablet  1 tablet Oral BID WC  . pantoprazole  40 mg Oral Q1200  . protein supplement  2 oz Oral QID  . vitamin B-12  1,000 mcg Oral Daily    Continuous Infusions:    Past Medical History  Diagnosis Date  . Polycystic ovarian syndrome   . Asthma   . Migraine     Past Surgical History  Procedure Laterality Date  . Cholecystectomy    . Cesarean section    . Laparoscopic gastric sleeve resection    . Laparoscopy N/A 07/19/2013    Procedure: LAPAROSCOPY DIAGNOSTIC;  Surgeon: Harl Bowie, MD;  Location: [name];  Service: General;  Laterality: N/A;  . Esophagogastroduodenoscopy N/A 07/19/2013    Procedure: ESOPHAGOGASTRODUODENOSCOPY (EGD);  Surgeon: Harl Bowie, MD;  Location: [facility];  Service: General;  Laterality: N/A;    Inda Coke MS, RD, LDN Inpatient Registered Dietitian Pager: [phone] After-hours pager: [phone]

## 2013-07-28 NOTE — Progress Notes (Signed)
Patient refusing breathing treatment at this time and also does not want to use a CPAP tonight.  RT will continue to monitor.

## 2013-07-29 ENCOUNTER — Inpatient Hospital Stay (HOSPITAL_COMMUNITY): Payer: BC Managed Care – PPO

## 2013-07-29 ENCOUNTER — Encounter (HOSPITAL_COMMUNITY): Payer: BC Managed Care – PPO

## 2013-07-29 DIAGNOSIS — R5381 Other malaise: Secondary | ICD-10-CM

## 2013-07-29 NOTE — Progress Notes (Signed)
Pt does not wish to wear CPAP tonight. Attempted to place pt on auto mode and CPAP of 10 to see if she would tolerate. But she continued to state it was too much and she could not tolerate it. Pt has CPAP at home and does not wear because she feels it is too much pressure and she wakes up gasping. RT encouraged pt to talk with MD about another sleep study and/or potentially to her home health company to switch masks. Pt resting comfortably and encouraged to call RT if pt changes mind. RN made aware.

## 2013-07-29 NOTE — Progress Notes (Signed)
Patient information reviewed and entered into e[facility]hab system by Gevin Perea, RN, CRRN, PPS Coordinator.  Information including medical coding and functional independence measure will be reviewed and updated through discharge.    

## 2013-07-29 NOTE — Progress Notes (Signed)
[facility] PHYSICAL MEDICINE & REHABILITATION     PROGRESS NOTE    Subjective/Complaints: Felt chilled then sweaty last noc, no cough , no abd pain A  review of systems has been performed and if not noted above is otherwise negative.   Objective: Vital Signs: Blood pressure 143/85, pulse 97, temperature 98 F (36.7 C), temperature source Oral, resp. rate 18, height 5\' 5"  (1.651 m), weight 165.563 kg (365 lb), SpO2 98.00%. No results found.  Recent Labs  07/28/13 1202  WBC 9.6  HGB 13.4  HCT 41.9  PLT 196    Recent Labs  07/27/13 1200 07/28/13 1202  NA 138 141  K 3.6* 3.7  CL 97 101  GLUCOSE 104* 122*  BUN 11 11  CREATININE 0.76 0.76  CALCIUM 9.3 9.8   CBG (last 3)  No results found for this basename: GLUCAP,  in the last 72 hours  Wt Readings from Last 3 Encounters:  07/29/13 165.563 kg (365 lb)  07/26/13 165.79 kg (365 lb 8 oz)  07/26/13 165.79 kg (365 lb 8 oz)    Physical Exam:  Constitutional: She is oriented to person, place, and time. Morbidly obese  HENT: dentition fair. Oral mucosa moist  Head: Normocephalic.  Eyes: EOM are normal.  Neck: Normal range of motion. Neck supple. No thyromegaly present.  Cardiovascular: Normal rate and regular rhythm.  Respiratory: Effort normal and breath sounds normal. No respiratory distress.  GI: Soft. Bowel sounds are normal. Minimally tender Skin:  Small dehescience to fat layer of lap sites, no drainage  Psychiatric: She has a normal mood and affect.  Neuro:  5/5 Bilat Delt , Bi, tri, HI  3+/5 B HF, 4- KE, 4/5 Ankle DF/PF  Sensory intact except for median right hand, phalen's test + RUE  DTR's 1+ Ext no edema, no calf tenderness   Assessment/Plan: 1. Functional deficits secondary to deconditioning after gastrectomy/respiratory failure which require 3+ hours per day of interdisciplinary therapy in a comprehensive inpatient rehab setting. Physiatrist is providing close team supervision and 24 hour management  of active medical problems listed below. Physiatrist and rehab team continue to assess barriers to discharge/monitor patient progress toward functional and medical goals. FIM: FIM - Bathing Bathing Steps Patient Completed: Chest;Right Arm;Left Arm;Abdomen;Front perineal area;Buttocks;Right upper leg;Left upper leg Bathing: 4: Min-Patient completes 8-9 5f 10 parts or 75+ percent  FIM - Upper Body Dressing/Undressing Upper body dressing/undressing steps patient completed: Thread/unthread right sleeve of pullover shirt/dresss;Thread/unthread left sleeve of pullover shirt/dress;Put head through opening of pull over shirt/dress Upper body dressing/undressing: 5: Supervision: Safety issues/verbal cues FIM - Lower Body Dressing/Undressing Lower body dressing/undressing steps patient completed: Thread/unthread right underwear leg;Thread/unthread left underwear leg;Pull underwear up/down;Thread/unthread right pants leg;Thread/unthread left pants leg;Pull pants up/down Lower body dressing/undressing: 4: Min-Patient completed 75 plus % of tasks  FIM - Toileting Toileting steps completed by patient: Adjust clothing prior to toileting;Performs perineal hygiene;Adjust clothing after toileting Toileting Assistive Devices: Grab bar or rail for support Toileting: 5: Supervision: Safety issues/verbal cues  FIM - Radio producer Devices: Environmental consultant;Bedside commode Toilet Transfers: 5-To toilet/BSC: Supervision (verbal cues/safety issues);5-From toilet/BSC: Supervision (verbal cues/safety issues)  FIM - Control and instrumentation engineer Devices: Bed rails Bed/Chair Transfer: 5: Supine > Sit: Supervision (verbal cues/safety issues);5: Bed > Chair or W/C: Supervision (verbal cues/safety issues);5: Chair or W/C > Bed: Supervision (verbal cues/safety issues)  FIM - Locomotion: Wheelchair Distance: Pt demonstrating difficulty propelling w/c, B LE do not touch floor and nails  too long  to use B UE Locomotion: Wheelchair: 1: Total Assistance/staff pushes wheelchair (Pt<25%) FIM - Locomotion: Ambulation Locomotion: Ambulation Assistive Devices: Walker - Rolling;Other (comment) (rollator) Ambulation/Gait Assistance: 4: Min guard;5: Supervision Locomotion: Ambulation: 2: Travels 50 - 149 ft with minimal assistance (Pt.>75%)  Comprehension Comprehension Mode: Auditory Comprehension: 5-Understands complex 90% of the time/Cues < 10% of the time  Expression Expression Mode: Verbal Expression: 5-Expresses complex 90% of the time/cues < 10% of the time  Social Interaction Social Interaction: 6-Interacts appropriately with others with medication or extra time (anti-anxiety, antidepressant).  Problem Solving Problem Solving: 5-Solves complex 90% of the time/cues < 10% of the time  Memory Memory: 5-Recognizes or recalls 90% of the time/requires cueing < 10% of the time  Medical Problem List and Plan:  1. Functional deficits secondary to deconditioning/respiratory failure after laparoscopic sleeve gastrectomy 07/14/2013  2. DVT Prophylaxis/Anticoagulation: Subcutaneous heparin. Monitor platelet counts any signs of bleeding  3. Pain Management: Tylenol as needed  4. History of asthma. Continue nebulizer inhalers as directed. Check oxygen saturations every shift  5. Obstructive sleep apnea. Continue CPAP. Patient does not use her CPAP routinely at home secondary to dislike in the mask  6. Neuropsych: This patient is capable of making decisions on her own behalf.  7. CTS RUE: ---symptoms slightly improved  -?wrist splint--hold on for now  LOS (Days) 2 A FACE TO FACE EVALUATION WAS PERFORMED  Charlett Blake 07/29/2013 10:58 AM

## 2013-07-29 NOTE — Progress Notes (Signed)
Physical Therapy Session Note  Patient Details  Name: Melissa Erickson MRN: [id number] Date of Birth: 02-21-1980  Today's Date: 07/29/2013 Time: Treatment Session 1: 2248-2500; Treatment Session 2: 1335-1430  Time Calculation (min): Treatment Session 1: 45 min; Treatment Session 2: 52min  Short Term Goals: Week 1:  PT Short Term Goal 1 (Week 1): STGs=LTGs due to anticipated LOS  Skilled Therapeutic Interventions/Progress Updates:  Treatment Session 1:  1:1. Pt received sitting on couch in therapy apartment w/ OT, PT taking over tx session. Focus this session on overall functional endurance. Pt amb 100'x1,150'x2 and 200'x1 this session w/ use of rollator, req overall (S). No cues for management of breaks. Pt engaged in game of Wii tennis to further work on standing endurance w/out B UE support and close(S), 2 bouts of standing for 76min. Pt req prolonged seated rest breaks throughout session due to fatigue. SaO2 >96% and HR in 120's during standing mobility this session. Pt sitting in w/c at end of session w/ all needs in reach.    Treatment Session 2:  1:1. Pt received supine in bed, ready for therapy. Focus this session on functional endurance in controlled, household and community environments. Pt amb 150'x3 on unit w/ rollator, 75'x1 in therapy apartment w/out AD. Pt also able to amb 150'x2 in hospital lobby, on/off elevator and 100'x3 outside on uneven surfaces/inclines as well as negotiation up/down 6 stepsx2 (1x w/ B UE on single rail sideways, 2x w/ use of single rail/single UE forwards), req close(S)-min gurad A. Pt req prolonged seated rest breaks throughout session. Pt supine in bed at end of session w/ all needs in reach. SaO2 >96% and HR in 120's during standing mobility this session.   Therapy Documentation Precautions:  Precautions Precautions: Fall Restrictions Weight Bearing Restrictions: No Vital Signs: Therapy Vitals Pulse Rate: 125 (during standing tasks in  therapy) Oxygen Therapy SpO2: 98 % O2 Device: None (Room air) Pain: Pain Assessment Pain Assessment: No/denies pain  See FIM for current functional status  Therapy/Group: Individual Therapy  Gilmore Laroche 07/29/2013, 12:09 PM

## 2013-07-29 NOTE — Progress Notes (Signed)
Occupational Therapy Session Note  Patient Details  Name: Melissa Erickson MRN: [id number] Date of Birth: 1980-09-03  Today's Date: 07/29/2013  Session 1 Time: 0017-4944 Time Calculation (min): 59 min  Short Term Goals: Week 1:  OT Short Term Goal 1 (Week 1): STG=LTG due to short LOS  Skilled Therapeutic Interventions/Progress Updates:    Pt asleep in bed upon arrival but easily aroused.  Pt engaged in bathing at shower level and dressing with sit<>stand from shower seat. Pt ambulated with Rollator to bathroom to use toilet prior to transferring to shower for bathing.  Pt issued long hand sponge to assist with LB bathing.  Pt required assistance with donning socks this morning.  Pt required extra time during all tasks this morning and took multiple rest breaks throughout session.  Pt sat EOB at end of session to eat breakfast.  Pt requires extra time to initiate tasks.  O2 sats >94% on RA throughout session.  Focus on activity tolerance, task initiation, sequencing, sit<>stand, standing balance, functional amb with Rollator, and safety awareness.  Therapy Documentation Precautions:  Precautions Precautions: Fall Restrictions Weight Bearing Restrictions: No   Pain: Pain Assessment Pain Assessment: No/denies pain  See FIM for current functional status  Therapy/Group: Individual Therapy  Session 2 Time: 9675-9163 Pt denied pain Individual Therapy  Pt in bed with RN at side changing abdominal dressing.  Pt engaged in functional amb with Rollator for home mgmt tasks to address activity tolerance and endurance, standing balance, and safety awareness.  Pt amb with Rollator from room to ADL apartment without a rest break. O2 sats at 94% and HR at 113 at end of walk.  Pt required increased time for rest break before engaging in additional tasks.  Educated patient on safety in the home and with Rollator.  Pt verbalized understanding.   [name] [facility] 07/29/2013, 9:47 AM

## 2013-07-30 ENCOUNTER — Inpatient Hospital Stay (HOSPITAL_COMMUNITY): Payer: BC Managed Care – PPO

## 2013-07-30 MED ORDER — IOHEXOL 300 MG/ML  SOLN
75.0000 mL | Freq: Once | INTRAMUSCULAR | Status: AC | PRN
  Administered 2013-07-30: 75 mL via INTRAVENOUS

## 2013-07-30 MED ORDER — HYDROCODONE-ACETAMINOPHEN 5-325 MG PO TABS
1.0000 | ORAL_TABLET | Freq: Four times a day (QID) | ORAL | Status: DC | PRN
  Administered 2013-07-30 – 2013-08-02 (×7): 1 via ORAL
  Filled 2013-07-30 (×7): qty 1

## 2013-07-30 NOTE — Progress Notes (Signed)
Patient refusing CPAP tonight.  Stated that her right side of her face was hurting.  Was told if she felt better and changed her mind to call RT.

## 2013-07-30 NOTE — IPOC Note (Signed)
Overall Plan of Care [facility]) Patient Details Name: Melissa Erickson MRN: [id number] DOB: [birth year]  Admitting Diagnosis: deconditioning  VDRF  Hospital Problems: Active Problems:   Physical deconditioning     Functional Problem List: Nursing Edema;Endurance;Medication Management;Nutrition;Pain;Skin Integrity;Perception;Safety;Sensory  PT Balance;Endurance;Safety;Pain;Skin Integrity  OT Balance;Cognition;Edema;Endurance;Motor;Pain;Safety  SLP    TR         Basic ADL's: OT Bathing;Dressing;Toileting     Advanced  ADL's: OT Simple Meal Preparation     Transfers: PT Bed to Chair;Car;Furniture  OT Toilet;Tub/Shower     Locomotion: PT Stairs;Ambulation     Additional Impairments: OT None  SLP        TR      Anticipated Outcomes Item Anticipated Outcome  Self Feeding mod I   Swallowing      Basic self-care  mod I   Toileting  mod I    Bathroom Transfers mod I   Bowel/Bladder  Min A  Transfers  (S)-mod(I)  Locomotion  (S)-Mod(I)  Communication     Cognition     Pain  <2 on a 0-10 scale  Safety/Judgment  Supervision   Therapy Plan: PT Intensity: Minimum of 1-2 x/day ,45 to 90 minutes PT Frequency: 5 out of 7 days PT Duration Estimated Length of Stay: 7-10days OT Intensity: Minimum of 1-2 x/day, 45 to 90 minutes OT Frequency: 5 out of 7 days OT Duration/Estimated Length of Stay: 7-10 days         Team Interventions: Nursing Interventions Patient/Family Education;Disease Management/Prevention;Pain Management;Medication Management;Skin Care/Wound Management;Discharge Planning;Psychosocial Support  PT interventions Ambulation/gait training;Community reintegration;DME/adaptive equipment instruction;Psychosocial support;Stair training;UE/LE Strength taining/ROM;Wheelchair propulsion/positioning;UE/LE Coordination activities;Therapeutic Activities;Discharge planning;Balance/vestibular training;Disease management/prevention;Functional mobility  training;Patient/family education;Therapeutic Exercise;Neuromuscular re-education;Pain management;Skin care/wound management;Cognitive remediation/compensation  OT Interventions Balance/vestibular training;Cognitive remediation/compensation;Discharge planning;DME/adaptive equipment instruction;Functional mobility training;Community reintegration;Psychosocial support;Therapeutic Activities;UE/LE Strength taining/ROM;Therapeutic Exercise;UE/LE Coordination activities;Patient/family education;Self Care/advanced ADL retraining  SLP Interventions    TR Interventions    SW/CM Interventions      Team Discharge Planning: Destination: PT-Home ,OT- Home , SLP-  Projected Follow-up: PT-Outpatient PT, OT-  None, SLP-  Projected Equipment Needs: PT-To be determined, OT- Tub/shower bench, SLP-  Equipment Details: PT- , OT-  Patient/family involved in discharge planning: PT- Patient,  OT-Patient, SLP-   MD ELOS: 7d Medical Rehab Prognosis:  Good Assessment: 33 y.o. right-handed female with history of morbid obesity, hypertension, OSA, asthma. Patient independent prior to admission. Patient recently underwent laparoscopic vertical sleeve gastrectomy 07/14/2013 in [facility]. She was discharged to home the following day and subsequently admitted to the PCCM. service 07/18/2013 after presenting with acute hypoxic failure requiring intubation shortly after arrival. CT abdomen pelvis and chest showed no pulmonary embolus. Multilobar groundglass and consolidation indicated of edema versus pneumonia. Placed on broad-spectrum antibiotics and since been completed. Venous Doppler studies lower extremities negative for DVT. Followup per critical care pulmonary services. Echocardiogram with ejection fraction 55% mild LVH grade 1 diastolic dysfunction. Patient extubated 07/22/2013.   Now requiring 24/7 Rehab RN,MD, as well as CIR level PT, OT and SLP.  Treatment team will focus on ADLs and mobility with goals  set at Mod I   See Team Conference Notes for weekly updates to the plan of care

## 2013-07-30 NOTE — Progress Notes (Signed)
[place] PHYSICAL MEDICINE & REHABILITATION     PROGRESS NOTE    Subjective/Complaints: Right side neck and facial pain, hurts to chew , no swallowing problem No arm pain or weakness A  review of systems has been performed and if not noted above is otherwise negative.   Objective: Vital Signs: Blood pressure 106/69, pulse 85, temperature 97.9 F (36.6 C), temperature source Oral, resp. rate 18, height 5\' 5"  (1.651 m), weight 165.5 kg (364 lb 13.8 oz), SpO2 96.00%. No results found.  Recent Labs  07/28/13 1202  WBC 9.6  HGB 13.4  HCT 41.9  PLT 196    Recent Labs  07/27/13 1200 07/28/13 1202  NA 138 141  K 3.6* 3.7  CL 97 101  GLUCOSE 104* 122*  BUN 11 11  CREATININE 0.76 0.76  CALCIUM 9.3 9.8   CBG (last 3)  No results found for this basename: GLUCAP,  in the last 72 hours  Wt Readings from Last 3 Encounters:  07/30/13 165.5 kg (364 lb 13.8 oz)  07/26/13 165.79 kg (365 lb 8 oz)  07/26/13 165.79 kg (365 lb 8 oz)    Physical Exam:  Constitutional: She is oriented to person, place, and time. Morbidly obese  HENT: dentition fair. Oral mucosa moist , tenderness over the Right sternocleidomastoid, no erythema or swelling noted Head: Normocephalic.  Eyes: EOM are normal.  Neck: Normal range of motion. Neck supple. No thyromegaly present.  Cardiovascular: Normal rate and regular rhythm.  Respiratory: Effort normal and breath sounds normal. No respiratory distress.  GI: Soft. Bowel sounds are normal. Minimally tender Skin:  Small dehescience to fat layer of lap sites, no drainage  Psychiatric: She has a normal mood and affect.  Neuro:  5/5 Bilat Delt , Bi, tri, HI  3+/5 B HF, 4- KE, 4/5 Ankle DF/PF  Sensory intact except for median right hand, phalen's test + RUE  DTR's 1+ Ext no edema, no calf tenderness   Assessment/Plan: 1. Functional deficits secondary to deconditioning after gastrectomy/respiratory failure which require 3+ hours per day of  interdisciplinary therapy in a comprehensive inpatient rehab setting. Physiatrist is providing close team supervision and 24 hour management of active medical problems listed below. Physiatrist and rehab team continue to assess barriers to discharge/monitor patient progress toward functional and medical goals. FIM: FIM - Bathing Bathing Steps Patient Completed: Chest;Right Arm;Left Arm;Abdomen;Front perineal area;Buttocks;Right upper leg;Left upper leg Bathing: 4: Min-Patient completes 8-9 77f 10 parts or 75+ percent  FIM - Upper Body Dressing/Undressing Upper body dressing/undressing steps patient completed: Thread/unthread right sleeve of pullover shirt/dresss;Thread/unthread left sleeve of pullover shirt/dress;Put head through opening of pull over shirt/dress Upper body dressing/undressing: 5: Supervision: Safety issues/verbal cues FIM - Lower Body Dressing/Undressing Lower body dressing/undressing steps patient completed: Thread/unthread right underwear leg;Thread/unthread left underwear leg;Pull underwear up/down;Thread/unthread right pants leg;Thread/unthread left pants leg;Pull pants up/down Lower body dressing/undressing: 4: Min-Patient completed 75 plus % of tasks  FIM - Toileting Toileting steps completed by patient: Adjust clothing prior to toileting;Performs perineal hygiene;Adjust clothing after toileting Toileting Assistive Devices: Grab bar or rail for support Toileting: 5: Supervision: Safety issues/verbal cues  FIM - Radio producer Devices: Nurse, learning disability Transfers: 7-To toilet/BSC;5-To toilet/BSC: Supervision (verbal cues/safety issues)  FIM - Control and instrumentation engineer Devices: Bed rails Bed/Chair Transfer: 5: Chair or W/C > Bed: Supervision (verbal cues/safety issues);5: Bed > Chair or W/C: Supervision (verbal cues/safety issues)  FIM - Locomotion: Wheelchair Distance: Pt demonstrating difficulty  propelling w/c, B  LE do not touch floor and nails too long to use B UE Locomotion: Wheelchair: 0: Activity did not occur FIM - Locomotion: Ambulation Locomotion: Ambulation Assistive Devices: Other (comment) (rollator) Ambulation/Gait Assistance: 5: Supervision Locomotion: Ambulation: 5: Travels 150 ft or more with supervision/safety issues  Comprehension Comprehension Mode: Auditory Comprehension: 7-Follows complex conversation/direction: With no assist  Expression Expression Mode: Verbal Expression: 7-Expresses complex ideas: With no assist  Social Interaction Social Interaction: 7-Interacts appropriately with others - No medications needed.  Problem Solving Problem Solving: 5-Solves basic 90% of the time/requires cueing < 10% of the time  Memory Memory: 5-Recognizes or recalls 90% of the time/requires cueing < 10% of the time  Medical Problem List and Plan:  1. Functional deficits secondary to deconditioning/respiratory failure after laparoscopic sleeve gastrectomy 07/14/2013  2. DVT Prophylaxis/Anticoagulation: Subcutaneous heparin. Monitor platelet counts any signs of bleeding  3. Pain Management: Tylenol as needed  4. History of asthma. Continue nebulizer inhalers as directed. Check oxygen saturations every shift  5. Obstructive sleep apnea. Continue CPAP. Patient does not use her CPAP routinely at home secondary to dislike in the mask  6. Neuropsych: This patient is capable of making decisions on her own behalf.  7. CTS RUE: ---symptoms slightly improved  -?wrist splint--hold on for now  LOS (Days) 3 A FACE TO FACE EVALUATION WAS PERFORMED  Alysia Penna E 07/30/2013 10:39 AM

## 2013-07-31 ENCOUNTER — Inpatient Hospital Stay (HOSPITAL_COMMUNITY): Payer: BC Managed Care – PPO

## 2013-07-31 ENCOUNTER — Inpatient Hospital Stay (HOSPITAL_COMMUNITY): Payer: BC Managed Care – PPO | Admitting: Occupational Therapy

## 2013-07-31 ENCOUNTER — Encounter (HOSPITAL_COMMUNITY): Payer: BC Managed Care – PPO | Admitting: Occupational Therapy

## 2013-07-31 MED ORDER — GABAPENTIN 100 MG PO CAPS
100.0000 mg | ORAL_CAPSULE | Freq: Three times a day (TID) | ORAL | Status: DC
  Administered 2013-07-31 – 2013-08-01 (×6): 100 mg via ORAL
  Filled 2013-07-31 (×16): qty 1

## 2013-07-31 NOTE — Progress Notes (Signed)
[place] PHYSICAL MEDICINE & REHABILITATION     PROGRESS NOTE    Subjective/Complaints: Right side neck and facial pain, hurts to chew , no swallowing problem No arm pain or weakness Discussed results of CT neck/soft tissue, with contrast , no evidence of hematoma or infection A  review of systems has been performed and if not noted above is otherwise negative.   Objective: Vital Signs: Blood pressure 99/65, pulse 89, temperature 98 F (36.7 C), temperature source Oral, resp. rate 18, height 5\' 5"  (1.651 m), weight 164.4 kg (362 lb 7 oz), SpO2 96.00%. Ct Soft Tissue Neck W Contrast  07/30/2013   CLINICAL DATA:  Neck and facial pain.  EXAM: CT NECK WITH CONTRAST  TECHNIQUE: Multidetector CT imaging of the neck was performed using the standard protocol following the bolus administration of intravenous contrast.  CONTRAST:  90mL OMNIPAQUE IOHEXOL 300 MG/ML  SOLN  COMPARISON:  None.  FINDINGS: This is a very limited exam due to artifact from patient's size. Base of skull is normal. Mucous retention cyst left maxillary sinus. Nasopharynx is clear. Oropharynx is clear. The larynx is normal. Trachea is widely patent. Parotid glands and submandibular glands are normal. Shotty cervical lymph nodes are present. Shotty submental and submandibular lymph nodes are present. Thyroid is normal. Tiny radiopacity is noted projected right subclavian region. A very tiny catheter fragment cannot be excluded . Apical pulmonary pleural parenchymal thickening consistent with scarring.  IMPRESSION: Very limited exam due to patient's size. A tiny opacity is noted projected over the right subclavian region. A tiny catheter fragment cannot be entirely excluded. Correlation with PA chest x-ray suggested.   Electronically Signed   By: Marcello Moores  Register   On: 07/30/2013 17:43    Recent Labs  07/28/13 1202  WBC 9.6  HGB 13.4  HCT 41.9  PLT 196    Recent Labs  07/28/13 1202  NA 141  K 3.7  CL 101  GLUCOSE 122*   BUN 11  CREATININE 0.76  CALCIUM 9.8   CBG (last 3)  No results found for this basename: GLUCAP,  in the last 72 hours  Wt Readings from Last 3 Encounters:  07/31/13 164.4 kg (362 lb 7 oz)  07/26/13 165.79 kg (365 lb 8 oz)  07/26/13 165.79 kg (365 lb 8 oz)    Physical Exam:  Constitutional: She is oriented to person, place, and time. Morbidly obese  HENT: dentition fair. Oral mucosa moist , tenderness over the Right sternocleidomastoid, no erythema or swelling noted, no facial numbness or pain to palpation Head: Normocephalic.  Eyes: EOM are normal.  Neck: Normal range of motion. Neck supple. No thyromegaly present.  Cardiovascular: Normal rate and regular rhythm.  Respiratory: Effort normal and breath sounds normal. No respiratory distress.  GI: Soft. Bowel sounds are normal. Minimally tender Skin:  No erythema over r side neck Psychiatric: She has a normal mood and affect.  Neuro:  5/5 Bilat Delt , Bi, tri, HI  3+/5 B HF, 4- KE, 4/5 Ankle DF/PF  Sensory intact except for median right hand, phalen's test + RUE  DTR's 1+ Ext no edema, no calf tenderness   Assessment/Plan: 1. Functional deficits secondary to deconditioning after gastrectomy/respiratory failure which require 3+ hours per day of interdisciplinary therapy in a comprehensive inpatient rehab setting. Physiatrist is providing close team supervision and 24 hour management of active medical problems listed below. Physiatrist and rehab team continue to assess barriers to discharge/monitor patient progress toward functional and medical goals. FIM: FIM -  Bathing Bathing Steps Patient Completed: Chest;Right Arm;Left Arm;Abdomen;Front perineal area;Buttocks;Right upper leg;Left upper leg Bathing: 4: Min-Patient completes 8-9 7f 10 parts or 75+ percent  FIM - Upper Body Dressing/Undressing Upper body dressing/undressing steps patient completed: Thread/unthread right sleeve of pullover shirt/dresss;Thread/unthread left  sleeve of pullover shirt/dress;Put head through opening of pull over shirt/dress Upper body dressing/undressing: 5: Supervision: Safety issues/verbal cues FIM - Lower Body Dressing/Undressing Lower body dressing/undressing steps patient completed: Thread/unthread right underwear leg;Thread/unthread left underwear leg;Pull underwear up/down;Thread/unthread right pants leg;Thread/unthread left pants leg;Pull pants up/down Lower body dressing/undressing: 4: Min-Patient completed 75 plus % of tasks  FIM - Toileting Toileting steps completed by patient: Adjust clothing prior to toileting;Performs perineal hygiene;Adjust clothing after toileting Toileting Assistive Devices: Grab bar or rail for support Toileting: 6: Assistive device: No helper  FIM - Radio producer Devices: Nurse, learning disability Transfers: 7-To toilet/BSC;5-To toilet/BSC: Supervision (verbal cues/safety issues)  FIM - Control and instrumentation engineer Devices: Bed rails Bed/Chair Transfer: 5: Chair or W/C > Bed: Supervision (verbal cues/safety issues);5: Bed > Chair or W/C: Supervision (verbal cues/safety issues)  FIM - Locomotion: Wheelchair Distance: Pt demonstrating difficulty propelling w/c, B LE do not touch floor and nails too long to use B UE Locomotion: Wheelchair: 0: Activity did not occur FIM - Locomotion: Ambulation Locomotion: Ambulation Assistive Devices: Other (comment) (rollator) Ambulation/Gait Assistance: 5: Supervision Locomotion: Ambulation: 5: Travels 150 ft or more with supervision/safety issues  Comprehension Comprehension Mode: Auditory Comprehension: 7-Follows complex conversation/direction: With no assist  Expression Expression Mode: Verbal Expression: 7-Expresses complex ideas: With no assist  Social Interaction Social Interaction: 7-Interacts appropriately with others - No medications needed.  Problem Solving Problem Solving: 5-Solves basic  90% of the time/requires cueing < 10% of the time  Memory Memory: 5-Recognizes or recalls 90% of the time/requires cueing < 10% of the time  Medical Problem List and Plan:  1. Functional deficits secondary to deconditioning/respiratory failure after laparoscopic sleeve gastrectomy 07/14/2013  2. DVT Prophylaxis/Anticoagulation: Subcutaneous heparin. Monitor platelet counts any signs of bleeding  3. Pain Management: Tylenol as needed  4. History of asthma. Continue nebulizer inhalers as directed. Check oxygen saturations every shift  5. Obstructive sleep apnea. Continue CPAP. Patient does not use her CPAP routinely at home secondary to dislike in the mask  6. Neuropsych: This patient is capable of making decisions on her own behalf.  7. CTS RUE: ---symptoms slightly improved  -?wrist splint--hold on for now 8.  R side neck pain, suspect neuritis after Central line- trial gabapentin LOS (Days) 4 A FACE TO FACE EVALUATION WAS PERFORMED  KIRSTEINS,ANDREW E 07/31/2013 9:12 AM

## 2013-07-31 NOTE — Progress Notes (Signed)
Physical Therapy Session Note  Patient Details  Name: Melissa Erickson MRN: [id number] Date of Birth: 1980-03-27  Today's Date: 07/31/2013  Skilled Therapeutic Interventions/Progress Updates:  1:1. Pt received supine in bed, verbalized not feeling well, like she was about to experience episode of emesis. Pt politely declined participation in scheduled 58min skilled physical therapy session due to not feeling well. Pt reported that RN aware and medication provided. Ax2 persons to repositioned in bed for improved comfort and positioning. Pt semi-reclined in bed w/ all needs in reach and bed alarm on. RN aware and reported just having assessed vitals, WNL. Will continue per current POC as appropriate.   Therapy Documentation Precautions:  Precautions Precautions: Fall Restrictions Weight Bearing Restrictions: No General: Amount of Missed PT Time (min): 30 Minutes Missed Time Reason: Patient ill (comment)  See FIM for current functional status  Therapy/Group: Individual Therapy  Gilmore Laroche 07/31/2013, 1:15 PM

## 2013-07-31 NOTE — Progress Notes (Signed)
Patient medicated with Vicodin twice during this shift for complaint of Facial/Neck pain on the right side.  No problems with swallowing thin liquids.  No drooling present.  No weakness to R side.  Patient repositions self in bed and prefers to sleep on her stomach with HOB flat.  Denies any abdominal discomfort.  Ambulated to bathroom with walker and supervision without difficulty.  Will monitor for changes in condition.    Melissa Erickson M

## 2013-07-31 NOTE — Progress Notes (Addendum)
Physical Therapy Session Note  Patient Details  Name: Melissa Erickson MRN: [id number] Date of Birth: [birth year]  Today's Date: 07/31/2013 Time: 1308-6578 Time Calculation (min): 55 min  Short Term Goals: Week 1:  PT Short Term Goal 1 (Week 1): STGs=LTGs due to anticipated LOS  Skilled Therapeutic Interventions/Progress Updates:  1:1. Pt received in bathroom alone, reported having rapid onset of diarrhea after taking vitamin. RN made aware. Pt educated regarding need for (S) when up in room for safety, especially when not feeling well, verbalized understanding. Bed alarm reinstated. Pt req (S) for upper/lower body dressing w/ exception of assistance for donning non-slip socks. Pt noted to be very quiet and lethargic, reporting some lightheadedness and 7/10 pain in neck. RN aware. Pt stated onset of pain in neck/face following use of heat on Friday night, but decreased pain w/ use of ice. Recommended continued use of ice, not heat. Pt agreeable to participate as able. Focus remainder of session on strength/endurance. Pt amb 175'x1 and 75'x1 w/ overall (S), slow pace and cues for pursed lip breathing due to report of feeling SOB. Pt performed therex including 2x10 reps: heel/toe raises, marching and LAQ; 3x3 reps of t/f sit<>stand w/out B UE support and 1x10 reps of mini squats. Seated rest breaks throughout session as needed. Pt demonstrating difficulty keeping EO at end of session due to pain, transported back to room seated on rollator by therapist. SaO2 >96% consistently throughout session and HR 125 after amb, uanble to obtain BP. Pt sitting EOB at end of session w/ RN in room to address concerns.   Therapy Documentation Precautions:  Precautions Precautions: Fall Restrictions Weight Bearing Restrictions: No   Vital Signs: Therapy Vitals Pulse Rate: 96 Resp: 18 BP: 135/85 mmHg Patient Position (if appropriate): Sitting Oxygen Therapy SpO2: 96 % O2 Device: None (Room air) Pain: Pain  Assessment Pain Assessment: 0-10 Pain Score: 7  Faces Pain Scale: Hurts little more Pain Type: Acute pain Pain Location: Neck Pain Orientation: Right Pain Descriptors / Indicators: Aching Pain Onset: On-going Pain Intervention(s): RN made aware;Distraction  See FIM for current functional status  Therapy/Group: Individual Therapy  Gilmore Laroche 07/31/2013, 10:30 AM

## 2013-07-31 NOTE — Progress Notes (Signed)
Occupational Therapy Session Note  Patient Details  Name: ADRAINE BIFFLE MRN: [id number] Date of Birth: September 29, 1980  Today's Date: 07/31/2013 Time: 1115-1200 Time Calculation (min): 45 min  Short Term Goals: Week 1:  OT Short Term Goal 1 (Week 1): STG=LTG due to short LOS  Skilled Therapeutic Interventions/Progress Updates:    1:1 self care retraining at shower level. Pt reported feeling slow today and not "feeling good" but had a a difficult time verbalizing what is different other than her jaw and face hurting enough to not eat her dinner last night and her breakfast this am.  Performed ADL at steady A level for balance and min A for donning and doffing socks. Pt required extra time to complete tasks due to fatigue. Demonstrated functional ambulation in room and bathroom with rollator with supervision.    2nd session: Pt in bed asleep and requested to rest since she was not feeling well.  Ice bag still on neck - added towel in between skin and ice bag. Missed 60 min of OT due to fatigue and ill.  Therapy Documentation Precautions:  Precautions Precautions: Fall Restrictions Weight Bearing Restrictions: No Pain:  c/o of right neck pain - RN aware and applied ice at end of session   See FIM for current functional status  Therapy/Group: Individual Therapy  Willeen [facility] 07/31/2013, 3:05 PM

## 2013-07-31 NOTE — Progress Notes (Signed)
Patient refusing to wear CPAP tonight.  States that her face is still hurting.  Was told if she changed her mind to call RT.

## 2013-08-01 ENCOUNTER — Inpatient Hospital Stay (HOSPITAL_COMMUNITY): Payer: BC Managed Care – PPO | Admitting: Physical Therapy

## 2013-08-01 ENCOUNTER — Inpatient Hospital Stay (HOSPITAL_COMMUNITY): Payer: BC Managed Care – PPO

## 2013-08-01 ENCOUNTER — Ambulatory Visit (HOSPITAL_COMMUNITY): Payer: BC Managed Care – PPO | Admitting: *Deleted

## 2013-08-01 ENCOUNTER — Encounter (HOSPITAL_COMMUNITY): Payer: BC Managed Care – PPO

## 2013-08-01 DIAGNOSIS — J9589 Other postprocedural complications and disorders of respiratory system, not elsewhere classified: Secondary | ICD-10-CM

## 2013-08-01 DIAGNOSIS — R5381 Other malaise: Secondary | ICD-10-CM

## 2013-08-01 DIAGNOSIS — I509 Heart failure, unspecified: Secondary | ICD-10-CM

## 2013-08-01 MED ORDER — CARBAMIDE PEROXIDE 6.5 % OT SOLN
5.0000 [drp] | Freq: Two times a day (BID) | OTIC | Status: AC
  Administered 2013-08-01 – 2013-08-03 (×6): 5 [drp] via OTIC
  Filled 2013-08-01 (×2): qty 15

## 2013-08-01 MED ORDER — POLYVINYL ALCOHOL 1.4 % OP SOLN
2.0000 [drp] | OPHTHALMIC | Status: DC | PRN
  Administered 2013-08-01 – 2013-08-03 (×5): 2 [drp] via OPHTHALMIC
  Filled 2013-08-01 (×2): qty 15

## 2013-08-01 NOTE — Progress Notes (Addendum)
Physical Therapy Session Note  Patient Details  Name: Melissa Erickson MRN: [id number] Date of Birth: 1980-10-22  Today's Date: 08/01/2013 Time: 7035-0093 Time Calculation (min): 30 min  Short Term Goals: Week 1:  PT Short Term Goal 1 (Week 1): STGs=LTGs due to anticipated LOS  Skilled Therapeutic Interventions/Progress Updates:    Pt received in treatment gym having just finished previous PT session. Pt agreeable to participating in current session. Per pt report of pain in R anterolateral lower cervical spine, initial 8 minutes of session focused on addressing soft tissue restrictions in cervical spine to mitigate pain, increase pt participation in session. Noted muscle guarding, decreased soft tissue extensibility, and tenderness to palpation R scalenes, R SCM. See below for detailed description of therapeutic exercises addressing said musculature. Performed gait x70', x60' in controlled and home environments with rollator with close supervision to min guard. In rehab apartment, performed furniture transfer with supervision, verbal cueing for safe use of rollator with ineffective within-session carryover. Performed stand pivot transfer from rollator seat>car simulator using rollator requiring supervision, verbal cueing for safe use of rollator (locking brakes during sit<>stand), safe technique, and  safe hand placement (focus on avoiding UE support on moveable car parts). Returned to room via gait x80' in controlled environment without assistive device with min A, L HHA. Session ended in pt room, where pt was left semi-reclined in bed with bed alarm on, 3 bed rails up, and all needs within reach.  Therapy Documentation Precautions:  Precautions Precautions: Fall Restrictions Weight Bearing Restrictions: No Vital Signs: Therapy Vitals Temp: 97.8 F (36.6 C) Temp src: Oral Pulse Rate: 66 Resp: 20 BP: 140/76 mmHg Patient Position (if appropriate): Lying Oxygen Therapy SpO2: 99  % Pain: Pain Assessment Pain Assessment: 0-10 Pain Score: 6  Pain Location: Neck Pain Orientation: Right Pain Descriptors / Indicators: Aching Pain Intervention(s): Other (Comment) (Stretching; per pt, RN already aware and had given medication) Multiple Pain Sites: No Locomotion : Ambulation Ambulation/Gait Assistance: 4: Min assist;4: Min guard;5: Supervision  Therapeutic Exercises:  Seated EOM, therapist guided pt through the following self-stretches (within pain-free parameters) to increase extensibility of R anterior/middle scalene muscles, mitigate pain in R aspect of cervical spine: R anterior scalene x3 minutes, R middle scalene x2 minutes.Contract-relax PNF alternating with prolonged passive stretch (wihtin pain-free parameters) of R SCM x1 minute, R anterior/middle scalene muscles x1 minute each.  See FIM for current functional status  Therapy/Group: Individual Therapy  Stefano Gaul 08/01/2013, 4:43 PM

## 2013-08-01 NOTE — Progress Notes (Signed)
Patient refuses to wear CPAP tonight.  Patient states that her face is hurting.  Regino Schultze, RRT, RCP.

## 2013-08-01 NOTE — Progress Notes (Signed)
Social Work  Social Work Assessment and Plan  Patient Details  Name: Melissa Erickson MRN: [id number] Date of Birth: 25-Sep-1980  Today's Date: 07/29/2013  Problem List:  Patient Active Problem List   Diagnosis Date Noted  . Physical deconditioning 07/28/2013  . Acute respiratory failure 07/20/2013  . NSTEMI (non-ST elevated myocardial infarction), type 2 secondary to demand ischemia sue to sepsis 07/20/2013  . Morbid obesity 07/18/2013  . ARDS (adult respiratory distress syndrome) 07/18/2013  . Hypertension 07/18/2013  . CHF (congestive heart failure) 07/18/2013   Past Medical History:  Past Medical History  Diagnosis Date  . Polycystic ovarian syndrome   . Asthma   . Migraine    Past Surgical History:  Past Surgical History  Procedure Laterality Date  . Cholecystectomy    . Cesarean section    . Laparoscopic gastric sleeve resection    . Laparoscopy N/A 07/19/2013    Procedure: LAPAROSCOPY DIAGNOSTIC;  Surgeon: Harl Bowie, MD;  Location: [facility];  Service: General;  Laterality: N/A;  . Esophagogastroduodenoscopy N/A 07/19/2013    Procedure: ESOPHAGOGASTRODUODENOSCOPY (EGD);  Surgeon: Harl Bowie, MD;  Location: [facility];  Service: General;  Laterality: N/A;   Social History:  reports that she has never smoked. She does not have any smokeless tobacco history on file. She reports that she does not drink alcohol or use illicit drugs.  Family / Support Systems Marital Status: Divorced How Long?: 1 year Patient Roles: Parent;Other (Comment) (employee) Children: 21 y.o. daughter, Melissa Erickson Other Supports: mother, Melissa Erickson @ (C) [phone];  aunt, Melissa Erickson @ (C) [phone] and godmother, Melissa Erickson @ [phone] Anticipated Caregiver: "Godmother" Melissa Erickson;  pt's mother with h/o CVA and cannot provide much support Ability/Limitations of Caregiver: none Caregiver Availability: 24/7 Family Dynamics: pt describes good relationships with mother, aunt and  godmother.    Social History Preferred language: English Religion: None Cultural Background: NA Read: Yes Write: Yes Employment Status: Employed Name of Employer: [employer] Length of Employment: 3 (yrs) Return to Work Plans: Pt hopes to be able to return to work in the next few months Freight forwarder Issues: none Guardian/Conservator: none - per MD, pt capable of making decsions on her own behalf   Abuse/Neglect Physical Abuse: Denies Verbal Abuse: Denies Sexual Abuse: Denies Exploitation of patient/patient's resources: Denies Self-Neglect: Denies  Emotional Status Pt's affect, behavior adn adjustment status: pt lying in bed and reports not feeling very well.  Admits feeling "stressed" about her financial situation as she is not going to be out of work longer than expected with recent surgery.  Denies any significant emotional distress.  No s/s of depression or anxiety - will monitor and refer to neuropsych as apporpriate. Recent Psychosocial Issues: none noted Pyschiatric History: None Substance Abuse History: None  Patient / Family Perceptions, Expectations & Goals Pt/Family understanding of illness & functional limitations: Pt and family with good understanding of current medical issues and functional limitations/ need for CIR. Premorbid pt/family roles/activities: Pt was completely independent, working full time and caring for her daughter. Anticipated changes in roles/activities/participation: Pt may need assistance at home initially with home management and childcare.  Would not anticipate permanent/ long term role changes. Pt/family expectations/goals: "I just want to get my strength back."  [employer]: None Premorbid Home Care/DME Agencies: None Transportation available at discharge: yes Resource referrals recommended: Psychology  Discharge Planning Living Arrangements: Children Support Systems: Other  relatives;Parent;Friends/neighbors Type of Residence: Private residence [employer]  Resources: Multimedia programmer (specify) Nurse, mental health) Financial Resources: Employment Financial Screen Referred: No Living Expenses: Rent Money Management: Patient Does the patient have any problems obtaining your medications?: No Home Management: pt Patient/Family Preliminary Plans: pt plans to return to her apt with godmother to stay and provide 24/7 as long as needed. Social Work Anticipated Follow Up Needs: HH/OP;Other (comment) (DSS financial assist programs to be used prn) Expected length of stay: 7 to10 days  Clinical Impression Pleasant, oriented young mother here following resp failure after recent surgery.  Good support at home and with care of her 59 y.o. Daughter.  Pt's primary concerns are about finances as she will be out of work longer than planned for just the surgery.  Will provide her with info about community resources/ programs she might access as needed.  Follow for d/c planning and support.  Melissa Erickson 07/29/2013, 11:32 AM

## 2013-08-01 NOTE — Progress Notes (Signed)
Occupational Therapy Session Note  Patient Details  Name: Melissa Erickson MRN: [id number] Date of Birth: [birth year]  Today's Date: 08/01/2013  Session 1 Time: 0915-1000 Time Calculation (min): 45 min  Short Term Goals: Week 1:  OT Short Term Goal 1 (Week 1): STG=LTG due to short LOS  Skilled Therapeutic Interventions/Progress Updates:    Pt seated EOB with RN and Respiratory Therapy present.  Pt requested to bathe at sink this morning secondary to fatigue.  Pt stated that she had a "rough weekend" and didn't sleep well.  Pt completed tasks at sink requiring assistance only with bathing feet and donning socks.  Pt required extra time to complete tasks with multiple rest breaks.  Pt requested to use toilet and amb with Rollator to complete task before returning to sit EOB. Pt remained seated EOB with all needs within reach.  Focus on activity tolerance, dynamic standing balance, functional amb with RW, transfers, and safety awareness.  Therapy Documentation Precautions:  Precautions Precautions: Fall Restrictions Weight Bearing Restrictions: No General: General Amount of Missed OT Time (min): 15 Minutes (Respiratory therapy treatment)   Pain: Pain Assessment Pain Assessment: 0-10 Pain Score: 4  Pain Type: Acute pain Pain Location: Ear Pain Orientation: Right Pain Descriptors / Indicators: Aching Pain Onset: On-going Pain Intervention(s): RN made aware  See FIM for current functional status  Therapy/Group: Individual Therapy  Session 2 Time: 1100-1200  Pt c/o 8/10 earache in right ear; RN aware, repositioned, and towel placed over ear at pt's request Individual therapy  Pt resting in bed upon arrival.  Pt stated that her ear was still painful and she was still tired but she was willing to go to kitchen.  Pt amb approx 25' before requesting to sit in w/c.  Pt transitioned to kitchen and prepared instant oatmeal at ambulatory level.  Pt amb in kitchen without AD to complete  task safely.  Pt required rest breaks X 3.  Pt dynamic standing tasks to address endurance and increase ability to perform BADLs.  Focus on IADLs, activity tolerance, safety awareness, functional amb with Rollator and without AD.  [name] [facility] 08/01/2013, 10:05 AM

## 2013-08-01 NOTE — Care Management Note (Signed)
Inpatient Rehabilitation Center Individual Statement of Services  Patient Name:  Melissa Erickson  Date:  07/29/2013  Welcome to the [facility].  Our goal is to provide you with an individualized program based on your diagnosis and situation, designed to meet your specific needs.  With this comprehensive rehabilitation program, you will be expected to participate in at least 3 hours of rehabilitation therapies Monday-Friday, with modified therapy programming on the weekends.  Your rehabilitation program will include the following services:   Physical Therapy (PT), Occupational Therapy (OT), 24 hour per day rehabilitation nursing, Therapeutic Recreaction (TR), Case Management (Education officer, museum), Rehabilitation Medicine, Nutrition Services, Pharmacy Services and Respiratory Therapy.  Weekly team conferences will be held on Tuesdays to discuss your progress.  Your Social Worker will talk with you frequently to get your input and to update you on team discussions.  Team conferences with you and your family in attendance may also be held.  Expected length of stay: 7-10 days  Overall anticipated outcome: modified independent  Depending on your progress and recovery, your program may change. Your Social Worker will coordinate services and will keep you informed of any changes. Your Social Worker's name and contact numbers are listed  below.  The following services may also be recommended but are not provided by the [facility] will be made to provide these services after discharge if needed.  Arrangements include referral to agencies that provide these services.  Your insurance has been verified to be:  [employer] Your primary doctor is:  Billey Chang  Pertinent information will be shared with your doctor and your insurance  company.  Social Worker:  [place], [place] or ([phone]   Information discussed with and copy given to patient by: Lennart Pall, 07/29/2013, 9:54 AM

## 2013-08-01 NOTE — Progress Notes (Signed)
[facility] PHYSICAL MEDICINE & REHABILITATION     PROGRESS NOTE    Subjective/Complaints: Right side neck and facial pain, hurts to chew , no swallowing problem No arm pain or weakness Discussed results of CT neck/soft tissue, with contrast , no evidence of hematoma or infection A  review of systems has been performed and if not noted above is otherwise negative.   Objective: Vital Signs: Blood pressure 109/69, pulse 67, temperature 98.2 F (36.8 C), temperature source Oral, resp. rate 18, height 5\' 5"  (1.651 m), weight 163.7 kg (360 lb 14.3 oz), SpO2 96.00%. Ct Soft Tissue Neck W Contrast  07/30/2013   CLINICAL DATA:  Neck and facial pain.  EXAM: CT NECK WITH CONTRAST  TECHNIQUE: Multidetector CT imaging of the neck was performed using the standard protocol following the bolus administration of intravenous contrast.  CONTRAST:  36mL OMNIPAQUE IOHEXOL 300 MG/ML  SOLN  COMPARISON:  None.  FINDINGS: This is a very limited exam due to artifact from patient's size. Base of skull is normal. Mucous retention cyst left maxillary sinus. Nasopharynx is clear. Oropharynx is clear. The larynx is normal. Trachea is widely patent. Parotid glands and submandibular glands are normal. Shotty cervical lymph nodes are present. Shotty submental and submandibular lymph nodes are present. Thyroid is normal. Tiny radiopacity is noted projected right subclavian region. A very tiny catheter fragment cannot be excluded . Apical pulmonary pleural parenchymal thickening consistent with scarring.  IMPRESSION: Very limited exam due to patient's size. A tiny opacity is noted projected over the right subclavian region. A tiny catheter fragment cannot be entirely excluded. Correlation with PA chest x-ray suggested.   Electronically Signed   By: Marcello Moores  Register   On: 07/30/2013 17:43   No results found for this basename: WBC, HGB, HCT, PLT,  in the last 72 hours No results found for this basename: NA, K, CL, CO, GLUCOSE, BUN,  CREATININE, CALCIUM,  in the last 72 hours CBG (last 3)  No results found for this basename: GLUCAP,  in the last 72 hours  Wt Readings from Last 3 Encounters:  08/01/13 163.7 kg (360 lb 14.3 oz)  07/26/13 165.79 kg (365 lb 8 oz)  07/26/13 165.79 kg (365 lb 8 oz)    Physical Exam:  Constitutional: She is oriented to person, place, and time. Morbidly obese  HENT: dentition fair. Oral mucosa moist. Right ear with wax builde up. Neck slightly tender to touch. Right ear a little tender as well.  Head: Normocephalic.  Eyes: EOM are normal.  Neck: Normal range of motion. Neck supple. No thyromegaly present.  Cardiovascular: Normal rate and regular rhythm.  Respiratory: Effort normal and breath sounds normal. No respiratory distress.  GI: Soft. Bowel sounds are normal. Minimally tender Skin:  Line site healed.  Psychiatric: She has a normal mood and affect.  Neuro:  5/5 Bilat Delt , Bi, tri, HI  3+/5 B HF, 4- KE, 4/5 Ankle DF/PF  Sensory intact except for median right hand, phalen's test + RUE  DTR's 1+ Ext no edema, no calf tenderness   Assessment/Plan: 1. Functional deficits secondary to deconditioning after gastrectomy/respiratory failure which require 3+ hours per day of interdisciplinary therapy in a comprehensive inpatient rehab setting. Physiatrist is providing close team supervision and 24 hour management of active medical problems listed below. Physiatrist and rehab team continue to assess barriers to discharge/monitor patient progress toward functional and medical goals. FIM: FIM - Bathing Bathing Steps Patient Completed: Chest;Right Arm;Left Arm;Abdomen;Front perineal area;Buttocks;Right upper leg;Left upper  leg Bathing: 4: Min-Patient completes 8-9 22f 10 parts or 75+ percent  FIM - Upper Body Dressing/Undressing Upper body dressing/undressing steps patient completed: Thread/unthread right sleeve of pullover shirt/dresss;Thread/unthread left sleeve of pullover  shirt/dress;Put head through opening of pull over shirt/dress Upper body dressing/undressing: 5: Set-up assist to: Obtain clothing/put away FIM - Lower Body Dressing/Undressing Lower body dressing/undressing steps patient completed: Thread/unthread right pants leg;Thread/unthread left pants leg;Pull pants up/down Lower body dressing/undressing: 4: Min-Patient completed 75 plus % of tasks  FIM - Toileting Toileting steps completed by patient: Adjust clothing prior to toileting;Performs perineal hygiene;Adjust clothing after toileting Toileting Assistive Devices: Grab bar or rail for support Toileting: 6: Assistive device: No helper  FIM - Radio producer Devices: Nurse, learning disability Transfers: 7-To toilet/BSC;5-To toilet/BSC: Supervision (verbal cues/safety issues)  FIM - Control and instrumentation engineer Devices: Bed rails Bed/Chair Transfer: 5: Bed > Chair or W/C: Supervision (verbal cues/safety issues);5: Chair or W/C > Bed: Supervision (verbal cues/safety issues)  FIM - Locomotion: Wheelchair Distance: Pt demonstrating difficulty propelling w/c, B LE do not touch floor and nails too long to use B UE Locomotion: Wheelchair: 1: Total Assistance/staff pushes wheelchair (Pt<25%) FIM - Locomotion: Ambulation Locomotion: Ambulation Assistive Devices: Other (comment) (rollator) Ambulation/Gait Assistance: 5: Supervision Locomotion: Ambulation: 5: Travels 150 ft or more with supervision/safety issues  Comprehension Comprehension Mode: Auditory Comprehension: 7-Follows complex conversation/direction: With no assist  Expression Expression Mode: Verbal Expression: 7-Expresses complex ideas: With no assist  Social Interaction Social Interaction: 7-Interacts appropriately with others - No medications needed.  Problem Solving Problem Solving: 5-Solves basic 90% of the time/requires cueing < 10% of the time  Memory Memory: 5-Recognizes  or recalls 90% of the time/requires cueing < 10% of the time  Medical Problem List and Plan:  1. Functional deficits secondary to deconditioning/respiratory failure after laparoscopic sleeve gastrectomy 07/14/2013  2. DVT Prophylaxis/Anticoagulation: Subcutaneous heparin. Monitor platelet counts any signs of bleeding  3. Pain Management: Tylenol as needed  4. History of asthma. Continue nebulizer inhalers as directed. Check oxygen saturations every shift  5. Obstructive sleep apnea. Continue CPAP. Patient does not use her CPAP routinely at home secondary to dislike in the mask  6. Neuropsych: This patient is capable of making decisions on her own behalf.  7. CTS RUE: ---symptoms slightly improved    8.  R side neck pain, suspect neuritis after Central line- gabapentin has been helpful  -has hx of headaches (migraine) PTA 9. Ear pain (R)- wax build up---debrox  LOS (Days) 5 A FACE TO FACE EVALUATION WAS PERFORMED  Melissa Erickson 08/01/2013 9:08 AM

## 2013-08-01 NOTE — Progress Notes (Signed)
Physical Therapy Note  Patient Details  Name: Melissa Erickson MRN: [id number] Date of Birth: [birth year] Today's Date: 08/01/2013 1340-1405, 25 min group tx Missed 35 min due to declining in order to eat lunch, wake up due to pain meds.  Pt very lethargic but willing to participate.  Stride Right gait group for gait training and LE therapeutic ex.    Stand pivot transfer bed> w/c to L.    Sit > stand to 4 [employer] with multiple VCs due to unsafe locking/unlocking of brakes, hand placement, etc.  Gait training with 4WW x 87' x 2 with min guard assist.  Therapeutic exercise performed with LE to increase strength for functional mobility: bil hip abd against lt green Theraband resistance, long arc R and L knee ext with isometric hold at end range, alternating R and L hip flexion- 10 x 1 each.  Scarlett Portlock 08/01/2013, 4:48 PM

## 2013-08-02 ENCOUNTER — Encounter (HOSPITAL_COMMUNITY): Payer: BC Managed Care – PPO

## 2013-08-02 ENCOUNTER — Inpatient Hospital Stay (HOSPITAL_COMMUNITY): Payer: BC Managed Care – PPO

## 2013-08-02 ENCOUNTER — Inpatient Hospital Stay (HOSPITAL_COMMUNITY): Payer: BC Managed Care – PPO | Admitting: *Deleted

## 2013-08-02 ENCOUNTER — Inpatient Hospital Stay (HOSPITAL_COMMUNITY): Payer: BC Managed Care – PPO | Admitting: Physical Therapy

## 2013-08-02 LAB — GLUCOSE, CAPILLARY: GLUCOSE-CAPILLARY: 126 mg/dL — AB (ref 70–99)

## 2013-08-02 MED ORDER — UNJURY CHOCOLATE CLASSIC POWDER
2.0000 [oz_av] | Freq: Two times a day (BID) | ORAL | Status: DC
  Filled 2013-08-02 (×5): qty 27

## 2013-08-02 MED ORDER — UNJURY CHICKEN SOUP POWDER
2.0000 [oz_av] | Freq: Two times a day (BID) | ORAL | Status: DC
  Administered 2013-08-03: 2 [oz_av] via ORAL
  Filled 2013-08-02 (×5): qty 27

## 2013-08-02 NOTE — Progress Notes (Signed)
NUTRITION FOLLOW UP  Intervention:   Continue Bariatric Advanced diet - pt will require this diet for up to 2 months s/p surgery.  Multivitamin, chewable BID with meals.  Continue Unjury nutrition supplement QID. Allow pt to bring in unflavored Unjury from home if desired.  Calcium supplement, chewable calcium citrate with vitamin D 500 mg TID (2 hours s/p MVI dosage)  Consider sublingual form of vitamin B12 - discussed with rehab PA-C. Pt may also need chewable forms of other medications to maximize absorption.  RD to continue to follow nutrition care plan.   Nutrition Dx:   Altered GI function related to recent sleeve gastrectomy AEB specialized micronutrient and macronutrient needs; ongoing  Goal:   Tolerate post gastric bypass diet without difficulty; unmet  Monitor:   PO intake, labs, weight trends, supplement tolerance  Assessment:   33 year old morbid obese female with PMH relevant for HTN, OSA, asthma. Underwent laparoscopic vertical sleeve gastrectomy on 07/14/13 Baldo Ash). Pt required intubation. Chest X ray with bilateral diffuse patchy infiltrates compatible with ARDS vs cardiogenic pulmonary edema.   RD re-visited pt today, per pt request. At first attempt to visit pt, family present but, pt out of room. Family report pt has been drinking regular Gatorade and had people bring her intstant mashed potatoes. Family request RD re-emphasize the importance of following bariatric diet with pt.  RD re-visited pt. Pt states she is eating very little and is getting tired of limited food options. She states she is only drinking 1 of the 4 Unjury protein shakes ordered due to intolerance. Pt reports vomiting if she consumes more than one shake. Encouraged pt to try mixing Unjury with Soy Milk or Lactaid instead of regular milk or to try Chicken Broth flavored Unjury.  Reviewed food options with pt and emphasized the importance of eating protein-rich foods first at each meal, chewing food  well, taking multivitamin daily, and avoiding sugar-containing beverages.   Pt has lost an additional 6 lbs in the past 6 days. Labs reviewed.   Height: Ht Readings from Last 1 Encounters:  07/27/13 5\' 5"  (1.651 m)    Weight Status:   Wt Readings from Last 1 Encounters:  08/02/13 359 lb 4.8 oz (162.977 kg)    Re-estimated needs:  Kcal: 1400 - 1600 kcal  Protein: >/= 114 grams  Fluid: at least 2.2 L/day  Skin: generalized edema  Diet Order:     Intake/Output Summary (Last 24 hours) at 08/02/13 1547 Last data filed at 08/02/13 1500  Gross per 24 hour  Intake    120 ml  Output      0 ml  Net    120 ml    Last BM: 6/15   Labs:   Recent Labs Lab 07/27/13 1200 07/28/13 1202  NA 138 141  K 3.6* 3.7  CL 97 101  CO2 25 23  BUN 11 11  CREATININE 0.76 0.76  CALCIUM 9.3 9.8  GLUCOSE 104* 122*    CBG (last 3)   Recent Labs  08/02/13 0015  GLUCAP 126*    Scheduled Meds: . albuterol  2.5 mg Nebulization BID  . budesonide (PULMICORT) nebulizer solution  0.25 mg Nebulization BID  . calcium citrate-vitamin D  1 tablet Oral TID  . carbamide peroxide  5 drop Right Ear BID  . gabapentin  100 mg Oral TID  . heparin  5,000 Units Subcutaneous 3 times per day  . mometasone-formoterol  2 puff Inhalation BID  . multivitamin-iron-minerals-folic acid  1  tablet Oral BID WC  . pantoprazole  40 mg Oral Q1200  . protein supplement  2 oz Oral QID  . vitamin B-12  1,000 mcg Oral Daily    Continuous Infusions:   Pryor Ochoa RD, LDN Inpatient Clinical Dietitian Pager: [phone] After Hours Pager: [phone]

## 2013-08-02 NOTE — Progress Notes (Signed)
Occupational Therapy Session Note  Patient Details  Name: Melissa Erickson MRN: [id number] Date of Birth: April 20, 1980  Today's Date: 08/02/2013  Session 1 Time: 0900-1000 Time Calculation (min): 60 min  Short Term Goals: Week 1:  OT Short Term Goal 1 (Week 1): STG=LTG due to short LOS  Skilled Therapeutic Interventions/Progress Updates:    Pt sitting EOB upon arrival and requested to bathe at sink this morning.  Pt stated she was still waiting for her ear drops. Pt also stated that she did not take any pain meds because they make her feel drowsy.  Pt engaged in bathing and dressing with sit<>stand from w/c at sink.  Pt also used the toilet X 2 and amb without AD into bathroom.  Pt required assistance only with donning socks this morning.  Pt continues to require extra time to complete tasks with multiple rest breaks.  Focus on activity tolerance, functional amb without AD for home mgmt tasks, dynamic standing balance, and safety awareness.  Therapy Documentation Precautions:  Precautions Precautions: Fall Restrictions Weight Bearing Restrictions: No   Pain: Pain Assessment Pain Assessment: 0-10 Pain Score: 7  Pain Location: Neck Pain Orientation: Right Pain Descriptors / Indicators: Aching Pain Onset: On-going Pain Intervention(s): RN made aware;Distraction  See FIM for current functional status  Therapy/Group: Individual Therapy  Session 2 Time: 3832-9191 Pt c/o 4/10 neck/ear pain; aching and throbbing; RN aware; pt declined pain medication Individual Therapy  Pt engaged in kitchen activity to address activity tolerance, safety awareness, and functional amb with AD for home mgmt tasks.  Pt gathered necessary items (bread, cheese, butter, pan, spatula, and knife) before preparing grilled cheese.  Pt prepared grilled cheese appropriately and safety and cleaned up soiled dishes safely.  Pt required rest break after preparation before walking to room.  Pt stood at stove for  approx 5 mins during preparation.  [name] [facility] 08/02/2013, 11:05 AM

## 2013-08-02 NOTE — Progress Notes (Signed)
Physical Therapy Session Note  Patient Details  Name: GINI CAPUTO MRN: [id number] Date of Birth: 28-Apr-1980  Today's Date: 08/02/2013 Time: 1430-1500 Time Calculation (min): 30 min  Short Term Goals: Week 1:  PT Short Term Goal 1 (Week 1): STGs=LTGs due to anticipated LOS  Skilled Therapeutic Interventions/Progress Updates:   Session focused on stair training and increased patient independence with HEP for neck ROM/stretching. Pt received asleep in bed, easily aroused. Gait training 2 x 150 ft using RW and supervision. Pt performed HEP using provided handout for neck AROM for improved muscle extensibility, decreased pain, and decreased muscle guarding, including cervical rotation, lateral flexion, and flexion/extension 2 x 20 sec each direction. Pt requesting continued assistance for cervical mobility in addition to HEP. Patient transferred sit <> supine on mat with S. In supine, therapist performed AAROM cervical rotation for decreased muscle guarding, soft tissue mobilization of cervical musculature and B upper traps as well as manual traction 4 x 30 sec. Pre-tx patient reporting 7/10 neck pain improved to 4/10 post-tx with improved neck AROM in all directions. Stair training up/down 5 stairs using 2 rails x 2 with supervision. Pt returned to room and left semi reclined in bed with all needs within reach.   Therapy Documentation Precautions:  Precautions Precautions: Fall Restrictions Weight Bearing Restrictions: No  See FIM for current functional status  Therapy/Group: Individual Therapy  Laretta Alstrom 08/02/2013, 3:46 PM

## 2013-08-02 NOTE — Progress Notes (Signed)
Physical Therapy Session Note  Patient Details  Name: Melissa Erickson MRN: [id number] Date of Birth: February 06, 1981  Today's Date: 08/02/2013 Time: 1000-1055 Time Calculation (min): 55 min  Short Term Goals: Week 1:  PT Short Term Goal 1 (Week 1): STGs=LTGs due to anticipated LOS  Skilled Therapeutic Interventions/Progress Updates:  1:1. Pt received sitting in w/c at sink, completing grooming. Focus this session on functional strength/endurance and neck ROM. Pt amb 100'x1, 150'x1 and 175'x1 w/ RW and distant (S). Pt performed neck AROM to improve muscle extensibility and decreased pain performing B rotation, side flexion and flexion/extension 2x20sec each. Pt cued for focus on breathing for relaxation and decreased muscle guarding. Handout provided. Practiced t/f sit<>stand 2x5 w/out B UE support and 2x10 reps of: mini squats, marching, toe raises, hamstring curls and side stepping (holding therapist's hands). Pt req prolonged seated rest throughout session due to fatigue/pain. Pt semi-reclined in bed at end of session w/ all needs in reach, bed alarm on. RN aware of pain.   Therapy Documentation Precautions:  Precautions Precautions: Fall Restrictions Weight Bearing Restrictions: No  Pain: Pain Assessment Pain Assessment: 0-10 Pain Score: 7  Pain Location: Neck Pain Orientation: Right Pain Descriptors / Indicators: Aching Pain Intervention(s): RN made aware;Distraction  See FIM for current functional status  Therapy/Group: Individual Therapy  Gilmore Laroche 08/02/2013, 11:02 AM

## 2013-08-02 NOTE — Progress Notes (Signed)
Pt. Refused cpap for tonight. 

## 2013-08-02 NOTE — Progress Notes (Signed)
[facility] PHYSICAL MEDICINE & REHABILITATION     PROGRESS NOTE    Subjective/Complaints: Neck sore still.  Slept fairly well. Getting ear drops to right ear A  review of systems has been performed and if not noted above is otherwise negative.   Objective: Vital Signs: Blood pressure 110/70, pulse 69, temperature 97.9 F (36.6 C), temperature source Oral, resp. rate 20, height 5\' 5"  (1.651 m), weight 162.977 kg (359 lb 4.8 oz), SpO2 97.00%. No results found. No results found for this basename: WBC, HGB, HCT, PLT,  in the last 72 hours No results found for this basename: NA, K, CL, CO, GLUCOSE, BUN, CREATININE, CALCIUM,  in the last 72 hours CBG (last 3)   Recent Labs  08/02/13 0015  GLUCAP 126*    Wt Readings from Last 3 Encounters:  08/02/13 162.977 kg (359 lb 4.8 oz)  07/26/13 165.79 kg (365 lb 8 oz)  07/26/13 165.79 kg (365 lb 8 oz)    Physical Exam:  Constitutional: She is oriented to person, place, and time. Morbidly obese  HENT: dentition fair. Oral mucosa moist. Right ear with wax builde up. Neck slightly tender to touch. Right ear a little tender as well.  Head: Normocephalic.  Eyes: EOM are normal.  Neck: Normal range of motion. Neck supple. No thyromegaly present.  Cardiovascular: Normal rate and regular rhythm.  Respiratory: Effort normal and breath sounds normal. No respiratory distress.  GI: Soft. Bowel sounds are normal. Minimally tender Skin:  Line site healed.  Psychiatric: She has a normal mood and affect.  Neuro:  5/5 Bilat Delt , Bi, tri, HI  3+/5 B HF, 4- KE, 4/5 Ankle DF/PF  Sensory intact except for median right hand, phalen's test + RUE  DTR's 1+ Ext no edema, no calf tenderness   Assessment/Plan: 1. Functional deficits secondary to deconditioning after gastrectomy/respiratory failure which require 3+ hours per day of interdisciplinary therapy in a comprehensive inpatient rehab setting. Physiatrist is providing close team supervision and 24  hour management of active medical problems listed below. Physiatrist and rehab team continue to assess barriers to discharge/monitor patient progress toward functional and medical goals. FIM: FIM - Bathing Bathing Steps Patient Completed: Chest;Right Arm;Left Arm;Abdomen;Front perineal area;Buttocks;Right upper leg;Left upper leg Bathing: 4: Min-Patient completes 8-9 33f 10 parts or 75+ percent  FIM - Upper Body Dressing/Undressing Upper body dressing/undressing steps patient completed: Thread/unthread right sleeve of pullover shirt/dresss;Thread/unthread left sleeve of pullover shirt/dress;Put head through opening of pull over shirt/dress Upper body dressing/undressing: 5: Set-up assist to: Obtain clothing/put away FIM - Lower Body Dressing/Undressing Lower body dressing/undressing steps patient completed: Thread/unthread right pants leg;Thread/unthread left pants leg;Pull pants up/down Lower body dressing/undressing: 4: Min-Patient completed 75 plus % of tasks  FIM - Toileting Toileting steps completed by patient: Adjust clothing prior to toileting;Performs perineal hygiene;Adjust clothing after toileting Toileting Assistive Devices: Grab bar or rail for support Toileting: 5: Supervision: Safety issues/verbal cues  FIM - Radio producer Devices: Walker;Elevated toilet seat Toilet Transfers: 5-To toilet/BSC: Supervision (verbal cues/safety issues);5-From toilet/BSC: Supervision (verbal cues/safety issues)  FIM - Engineer, site Assistive Devices: Arm rests;Walker Bed/Chair Transfer: 5: Bed > Chair or W/C: Supervision (verbal cues/safety issues);5: Chair or W/C > Bed: Supervision (verbal cues/safety issues);5: Sit > Supine: Supervision (verbal cues/safety issues)  FIM - Locomotion: Wheelchair Distance: Pt demonstrating difficulty propelling w/c, B LE do not touch floor and nails too long to use B UE Locomotion: Wheelchair: 1: Total  Assistance/staff pushes wheelchair (Pt<25%) FIM -  Locomotion: Ambulation Locomotion: Ambulation Assistive Devices: Other (comment) (rollator; L HHA) Ambulation/Gait Assistance: 4: Min assist;4: Min guard;5: Supervision Locomotion: Ambulation: 4: Travels 150 ft or more with minimal assistance (Pt.>75%)  Comprehension Comprehension Mode: Auditory Comprehension: 7-Follows complex conversation/direction: With no assist  Expression Expression Mode: Verbal Expression: 6-Expresses complex ideas: With extra time/assistive device  Social Interaction Social Interaction: 5-Interacts appropriately 90% of the time - Needs monitoring or encouragement for participation or interaction.  Problem Solving Problem Solving: 5-Solves basic 90% of the time/requires cueing < 10% of the time  Memory Memory: 6-More than reasonable amt of time  Medical Problem List and Plan:  1. Functional deficits secondary to deconditioning/respiratory failure after laparoscopic sleeve gastrectomy 07/14/2013  2. DVT Prophylaxis/Anticoagulation: Subcutaneous heparin. Monitor platelet counts any signs of bleeding  3. Pain Management: Tylenol as needed  4. History of asthma. Continue nebulizer inhalers as directed. Check oxygen saturations every shift  5. Obstructive sleep apnea. Continue CPAP. Patient does not use her CPAP routinely at home secondary to dislike in the mask  6. Neuropsych: This patient is capable of making decisions on her own behalf.  7. CTS RUE: ---symptoms slightly improved---observation only    8.  R side neck pain, suspect neuritis after Central line- gabapentin has been helpful  -has hx of headaches (migraine) PTA 9. Ear pain (R)- wax build up---debrox  LOS (Days) 6 A FACE TO FACE EVALUATION WAS PERFORMED  SWARTZ,ZACHARY T 08/02/2013 8:46 AM

## 2013-08-03 ENCOUNTER — Inpatient Hospital Stay (HOSPITAL_COMMUNITY): Payer: BC Managed Care – PPO

## 2013-08-03 ENCOUNTER — Encounter (HOSPITAL_COMMUNITY): Payer: BC Managed Care – PPO

## 2013-08-03 DIAGNOSIS — I509 Heart failure, unspecified: Secondary | ICD-10-CM

## 2013-08-03 DIAGNOSIS — J9589 Other postprocedural complications and disorders of respiratory system, not elsewhere classified: Secondary | ICD-10-CM

## 2013-08-03 DIAGNOSIS — R5381 Other malaise: Secondary | ICD-10-CM

## 2013-08-03 MED ORDER — CALCIUM CARBONATE-VITAMIN D 500-200 MG-UNIT PO TABS
1.0000 | ORAL_TABLET | Freq: Three times a day (TID) | ORAL | Status: DC
  Administered 2013-08-03: 1 via ORAL
  Filled 2013-08-03 (×7): qty 1

## 2013-08-03 MED ORDER — ADULT MULTIVITAMIN W/MINERALS CH
1.0000 | ORAL_TABLET | Freq: Two times a day (BID) | ORAL | Status: DC
  Administered 2013-08-03 – 2013-08-04 (×2): 1 via ORAL
  Filled 2013-08-03 (×5): qty 1

## 2013-08-03 NOTE — Progress Notes (Signed)
Physical Therapy Discharge Summary  Patient Details  Name: Melissa Erickson MRN: [id number] Date of Birth: 04-05-1980  Today's Date: 08/03/2013 Time: Treatment Session 1: 1100-1155; Treatment Session 2: 8588-5027 Time Calculation (min): Treatment Session 1: 55 min; Treatment Session 2: 52min  Patient has met 9 of 9 long term goals due to improved activity tolerance, improved balance, increased strength and decreased pain.  Patient to discharge at an ambulatory level mod(I) in home environment and supervision in community environments. Formal family training not performed as pt is able to direct care as needed.   Reasons goals not met: N/A  Recommendation:  Patient will benefit from ongoing skilled PT services in home health setting to continue to advance safe functional mobility, address ongoing impairments in significantly decreased functional endurance and minimize fall risk.  Equipment: No equipment provided  Reasons for discharge: treatment goals met and discharge from hospital  Patient/family agrees with progress made and goals achieved: Yes  Skilled Therapeutic Interventions Treatment Session 1: 1:1. Pt received supine in bed, ready for therapy. Focus this session on safe performance of functional mobility, d/c planning and HEP. Pt able to demonstrate standing balance, bed mobility, furniture transfers, amb in room w/out RW and ambulation on unit 200'x3 w/ RW at mod (I) level. Pt able to complete safe stair negotiation, car transfer and ambulation in community environment x200' w/ RW and overall (S). Pt performed HEP for neck ROM w/ intermittent min cueing for technique. Pt educated on safety in home environment, ways to progress endurance in home environment, goals of [facility] PT and d/c process. Pt verbalized understanding of all education including need for supervision during mobility outside home w/ use of RW. Pt sitting in recliner at end of session w/ all needs in reach.   Treatment  Session 2:  1:1. Pt received sitting EOB, ready for therapy. Focus this session on functional endurance and review of HEP and d/c planning. Pt amb room>therapy gym w/out RW, demonstrating very slow pace taking prolonged standing rest break at nurses station, req supervision. Pt performed 1x10 rep of each exercises on HEP to demonstrate understanding w/ min cues for correction. Pt amb back to room at end of session w/ RW and mod(I). Reviewed d/c process and role of HH PT. Pt verbalized understanding. Pt sitting EOB at end of session w/ all needs in reach.   PT Discharge Precautions/Restrictions Restrictions Weight Bearing Restrictions: No Vital Signs   Pain Pain Assessment Pain Assessment: No/denies pain Pain Score: 3  Pain Type: Neuropathic pain Pain Location: Neck Pain Orientation: Right Pain Descriptors / Indicators: Aching Pain Onset: On-going Pain Intervention(s): RN made aware Vision/Perception  At baseline Cognition Overall Cognitive Status: Within Functional Limits for tasks assessed Arousal/Alertness: Awake/alert Orientation Level: Oriented X4 Attention: Selective Selective Attention: Appears intact Memory: Appears intact Awareness: Appears intact Problem Solving: Appears intact Safety/Judgment: Appears intact Sensation Sensation Light Touch: Appears Intact Stereognosis: Appears Intact Hot/Cold: Appears Intact Proprioception: Appears Intact Coordination Gross Motor Movements are Fluid and Coordinated: Yes Fine Motor Movements are Fluid and Coordinated: Yes Motor  Motor Motor: Within Functional Limits  Mobility Bed Mobility Bed Mobility: Supine to Sit;Sit to Supine Supine to Sit: 7: Independent Sit to Supine: 7: Independent Transfers Transfers: Yes Sit to Stand: 6: Modified independent (Device/Increase time) Stand to Sit: 6: Modified independent (Device/Increase time) Stand Pivot Transfers: 6: Modified independent (Device/Increase time) Locomotion   Ambulation Ambulation/Gait Assistance: 6: Modified independent (Device/Increase time) Ambulation Distance (Feet): 200 Feet Assistive device: Rolling walker Ambulation/Gait Assistance Details:  Pt able to safely ambulate in home environment w/out AD, benefits from RW for endruance during longer distance ambulation Gait Gait: Yes Gait Pattern: Within Functional Limits Stairs / Additional Locomotion Stairs: Yes Stairs Assistance: 5: Supervision Stairs Assistance Details: Verbal cues for precautions/safety Stair Management Technique: One rail Left;Step to pattern;Forwards Wheelchair Mobility Distance: Pt at ambulatory level  Trunk/Postural Assessment  Cervical Assessment Cervical Assessment: Within Functional Limits Thoracic Assessment Thoracic Assessment: Within Functional Limits Lumbar Assessment Lumbar Assessment: Within Functional Limits Postural Control Postural Control: Within Functional Limits  Balance Static Sitting Balance Static Sitting - Level of Assistance: 7: Independent Dynamic Sitting Balance Dynamic Sitting - Level of Assistance: 7: Independent Static Standing Balance Static Standing - Balance Support: No upper extremity supported Static Standing - Level of Assistance: 7: Independent Dynamic Standing Balance Dynamic Standing - Balance Support: Left upper extremity supported;Right upper extremity supported;No upper extremity supported Dynamic Standing - Level of Assistance: 7: Independent;6: Modified independent (Device/Increase time) Dynamic Standing - Balance Activities: Reaching across midline;Reaching for weighted objects;Reaching for objects Extremity Assessment  RUE Assessment RUE Assessment: Within Functional Limits LUE Assessment LUE Assessment: Within Functional Limits RLE Assessment RLE Assessment: Within Functional Limits (Grossly 4+/5) LLE Assessment LLE Assessment: Within Functional Limits (Grossly 4+/5)  See FIM for current functional  status  Otis Brace S 08/03/2013, 8:02 PM

## 2013-08-03 NOTE — Discharge Summary (Signed)
Melissa Erickson, [name]              ACCOUNT NO.:  [id number]  MEDICAL RECORD NO.:  [id number]  LOCATION:  4M08C                        FACILITY:  [facility]  PHYSICIAN:  Meredith Staggers, M.D.DATE OF BIRTH:  23-Dec-1980  DATE OF ADMISSION:  07/27/2013 DATE OF DISCHARGE:  08/04/2013                              DISCHARGE SUMMARY   DISCHARGE DIAGNOSES: 1. Functional deficits secondary to deconditioning respiratory failure     after a laparoscopic sleeve gastrectomy Jul 14, 2013. 2. Subcutaneous heparin for deep vein thrombosis prophylaxis. 3. Pain management. 4. History of asthma. 5. Obstructive sleep apnea with continuous positive airway pressure. 6. CTS right upper extremity-improved. 7. Right side neck pain suspect neuritis after central line. 8. Ear pain with wax build up.  HISTORY OF PRESENT ILLNESS:  This is a 33 year old right-handed female, morbid obesity with obstructive sleep apnea who was independent prior to admission.  The patient recently underwent laparoscopic vertical sleeve gastrectomy Jul 14, 2013, in [place], [place].  She was discharged to home the following day, and subsequently admitted to the Critical Care Unit July 18, 2013, after presenting with acute hypoxic failure requiring intubation shortly after arrival.  CT of abdomen and pelvis showed no pulmonary embolism.  Multilobar ground glass and consolidation indicative of edema versus pneumonia placed on broad- spectrum antibiotics since completed.  Venous Doppler studies lower extremities negative for DVT.  Follow up per Critical Care Medicine. Echocardiogram with ejection fraction 55%, mild left ventricular hypertrophy, grade 1 diastolic dysfunction.  The patient extubated July 22, 2013.  Mildly elevated troponin.  Follow up per Cardiology Services felt to be demand ischemia and monitored.  No further plan for cardiac evaluation.  Maintained on subcutaneous heparin for DVT prophylaxis. She continued on a  bariatric diet with nectar liquids, which was slowly advanced.  Physical and occupational therapy ongoing.  The patient was admitted for comprehensive rehab program.  PAST MEDICAL HISTORY:  See discharge diagnoses.  SOCIAL HISTORY:  Lives with children.  FUNCTIONAL HISTORY PRIOR TO ADMISSION:  Independent, was working full- time for customer service.  FUNCTIONAL STATUS UPON ADMISSION TO REHAB SERVICES:  Minimal assist ambulated 175 feet with a rolling walker, moderate assist stand pivot transfers, min to mod assist activities of daily living.  PHYSICAL EXAMINATION:  VITAL SIGNS:  Blood pressure 100/45, pulse 96, temperature 98.3, respirations 18. GENERAL:  This was an alert female, oriented x3. HEENT:  Pupils round and reactive to light. LUNGS:  Clear to auscultation. CARDIAC:  Regular rate and rhythm. ABDOMEN:  Soft, nontender.  Good bowel sounds.  Staples intact to abdomen.  REHABILITATION HOSPITAL COURSE:  The patient was admitted to Inpatient Rehab Services with therapies initiated on a 3-hour daily basis consisting of physical therapy, occupational therapy, and rehabilitation nursing.  The following issues were addressed during the patient's rehabilitation stay.  Pertaining to Melissa Erickson's functional deficits secondary to deconditioning related to respiratory failure after laparoscopic sleeve gastrectomy Jul 14, 2013.  The patient continued to participate with her therapies.  She would follow up in [place], [place] after a recent laparoscopic sleeve gastrectomy.  Surgical site healing nicely.  She continued on subcutaneous heparin for DVT prophylaxis throughout her rehab course with no  bleeding episodes.  She did have a history of asthma complicated by obstructive sleep apnea. She was using her CPAP as well as nebulizer treatments.  She did need some encouragement to use her CPAP of which she was not compliant with at home.  She had some mild right-sided neck  pain suspect neuritis related to a central line which continued to improve.  She was maintained on Neurontin 100 mg 3 times daily.  She did receive some Debrox to right earwax buildup with good results.  The patient received weekly collaborative interdisciplinary team conferences to discuss estimated length of stay, family teaching, and any barriers to her discharge.  She was ambulating 175 feet with a rolling walker distance supervision.  Performed a neck active range of motion to improve muscle extensibility and decrease pain.  Focus was on breathing and relaxation techniques.  She could march in place.  She did require some rest breaks during her therapies although endurance did continue to improve throughout her hospital course.  She engaged in bathing and dressing with sit to stand from wheelchair at the sink requiring only some assistance for donning her socks.  Full family teaching was completed. Plan was to be discharged to home with ongoing therapies dictated per [facility].  DISCHARGE MEDICATIONS: 1. Pulmicort nebulizer as advised. 2. Vitamin D t.i.d. 3. Neurontin 100 mg p.o. t.i.d. 4. Hydrocodone 1 tablet every 6 hours as needed moderate pain,     dispense of 90 tablets. 5. Dulera 2 puffs twice daily. 6. Multivitamin daily. 7. Protonix 40 mg p.o. daily. 8. Vitamin B12 1000 mcg p.o. daily.  DIET:  Bariatric diet.  FOLLOWUP:  She would follow up with Dr. Alger Simons at the outpatient Rehab Service office as needed, Dr. Rosendo Gros Any medical management appointment to be made, Dr. Daneen Schick, Cardiology Services call for appointment.  Ongoing therapies arranged as per [employer].     Lauraine Rinne, P.A.   ______________________________ Meredith Staggers, M.D.    DA/MEDQ  D:  08/03/2013  T:  08/03/2013  Job:  [id number]  cc:   Belva Crome, M.D. Billey Chang, M.D.

## 2013-08-03 NOTE — Progress Notes (Signed)
Occupational Therapy Discharge Summary  Patient Details  Name: JASMINNE MEALY MRN: [id number] Date of Birth: December 18, 1980  Today's Date: 08/03/2013  Patient has met 9 of 9 long term goals due to improved activity tolerance, improved balance, postural control and ability to compensate for deficits.  Pt made steady progress with BADLs and IADLs during this admission.  Pt continues to require extra time with multiple rest breaks when performing tasks.  Pt takes rest breaks appropriately and does not exhibit any unsafe behaviors. Pt stated that her mother will be staying with her at discharge to assist when needed. No family has been present during therapy sessions. Patient to discharge at overall Modified Independent level.  Patient's care partner is independent to provide the necessary physical assistance at discharge.     Recommendation:  No OT f/u services needed   Equipment: BSC; tub bench recommended but patient declined (financial)  Reasons for discharge: treatment goals met and discharge from hospital  Patient/family agrees with progress made and goals achieved: Yes  OT Discharge ADL  See FIM Vision/Perception  Vision- History Baseline Vision/History: No visual deficits Patient Visual Report: No change from baseline Vision- Assessment Vision Assessment?: No apparent visual deficits  Cognition Overall Cognitive Status: Within Functional Limits for tasks assessed Arousal/Alertness: Awake/alert Orientation Level: Oriented X4 Attention: Selective Selective Attention: Appears intact Memory: Appears intact Awareness: Appears intact Problem Solving: Appears intact Problem Solving Impairment: Verbal basic;Functional basic Safety/Judgment: Appears intact Sensation Sensation Light Touch: Appears Intact Stereognosis: Appears Intact Hot/Cold: Appears Intact Proprioception: Appears Intact Coordination Gross Motor Movements are Fluid and Coordinated: Yes Fine Motor Movements  are Fluid and Coordinated: Yes Trunk/Postural Assessment  Cervical Assessment Cervical Assessment: Within Functional Limits Thoracic Assessment Thoracic Assessment: Within Functional Limits Lumbar Assessment Lumbar Assessment: Within Functional Limits Postural Control Postural Control: Within Functional Limits  Balance Static Sitting Balance Static Sitting - Balance Support: Feet supported Static Sitting - Level of Assistance: 7: Independent Dynamic Sitting Balance Dynamic Sitting - Balance Support: Feet supported Dynamic Sitting - Level of Assistance: 6: Modified independent (Device/Increase time) Extremity/Trunk Assessment RUE Assessment RUE Assessment: Within Functional Limits LUE Assessment LUE Assessment: Within Functional Limits  See FIM for current functional status  Leroy Libman 08/03/2013, 10:12 AM

## 2013-08-03 NOTE — Progress Notes (Signed)
Social Work Patient ID: Melissa Erickson, female   DOB: 10/08/1980, 33 y.o.   MRN: [id number]   Have reviewed team conference with pt who is aware and agreeable with targeted d/c date of 6/18 with mod I goals.  Aware I am arranging [facility] f/u and DME.  Asking about return to  Work - directed her to speak with MD.  Continue to follow.  HOYLE, LUCY, LCSW

## 2013-08-03 NOTE — Patient Care Conference (Signed)
Inpatient RehabilitationTeam Conference and Plan of Care Update Date: 08/02/2013   Time: 3:25 PM    Patient Name: Melissa Erickson      Medical Record Number: [id number]  Date of Birth: 16-Mar-1980 Sex: Female         Room/Bed: 4M08C/4M08C-01 Payor Info: Payor: [place] / Plan: BCBS [place] PPO / Product Type: *No Product type* /    Admitting Diagnosis: deconditioning  VDRF  Admit Date/Time:  07/27/2013  8:39 PM Admission Comments: No comment available   Primary Diagnosis:  <principal problem not specified> Principal Problem: <principal problem not specified>  Patient Active Problem List   Diagnosis Date Noted  . Physical deconditioning 07/28/2013  . Acute respiratory failure 07/20/2013  . NSTEMI (non-ST elevated myocardial infarction), type 2 secondary to demand ischemia sue to sepsis 07/20/2013  . Morbid obesity 07/18/2013  . ARDS (adult respiratory distress syndrome) 07/18/2013  . Hypertension 07/18/2013  . CHF (congestive heart failure) 07/18/2013    Expected Discharge Date: Expected Discharge Date: 08/04/13  Team Members Present: Social Worker Present: Lennart Pall, LCSW Nurse Present: Elliot Cousin, RN PT Present: Bridgett Ripa, Cottie Banda, PT OT Present: Roanna Epley, COTA;Jennifer Jolene Schimke, OT SLP Present: Weston Anna, SLP PPS Coordinator present : Daiva Nakayama, RN, CRRN;Becky Alwyn Ren, PT     Current Status/Progress Goal Weekly Team Focus  Medical   Deconditioning after gastrectomy, sepsis, pain issues, migraine rx  improve activity tolerance for fuctional activities  pain control, wound care   Bowel/Bladder   Continent of bowel and bladder  Modified Independent  Remain continent of bowel and bladder   Swallow/Nutrition/ Hydration             ADL's   supervision overall except LB dressing for donning socks; decreased activity tolerance  mod I overall  activity tolerance; LB dressing; IADLs   Mobility   Overall (S)  Mod(I)   functional endurance, safety, gross strengthening, d/c planning   Communication             Safety/Cognition/ Behavioral Observations            Pain   c/o of R ear pain, receiving Debrox 6.5% otic solution 5 drops BID  <2 on a 0-10 scale  assess pain q 4 hr. and reassess after each pain medication intervention   Skin   2 small open incision areas to the abdomen covered with foam dressing  remain free from infection and breakdown while on rehab  assess skin q shift    Rehab Goals Patient on target to meet rehab goals: Yes *See Care Plan and progress notes for long and short-term goals.  Barriers to Discharge: pain, activity tolerance    Possible Resolutions to Barriers:  stamina training, pain mgt    Discharge Planning/Teaching Needs:  home with young daughter.  Friends to assist as needed      Team Discussion:  Pain decreased today but still present.  Does not seem to really affect her participation with therapies.  On track to reach mod i goals and d/c Thurs  Revisions to Treatment Plan:  None   Continued Need for Acute Rehabilitation Level of Care: The patient requires daily medical management by a physician with specialized training in physical medicine and rehabilitation for the following conditions: Daily direction of a multidisciplinary physical rehabilitation program to ensure safe treatment while eliciting the highest outcome that is of practical value to the patient.: Yes Daily medical management of patient stability for increased activity during  participation in an intensive rehabilitation regime.: Yes Daily analysis of laboratory values and/or radiology reports with any subsequent need for medication adjustment of medical intervention for : Neurological problems;Post surgical problems  HOYLE, LUCY 08/03/2013, 2:07 PM

## 2013-08-03 NOTE — Discharge Summary (Signed)
Discharge summary job [id number]

## 2013-08-03 NOTE — Progress Notes (Signed)
[facility] PHYSICAL MEDICINE & REHABILITATION     PROGRESS NOTE    Subjective/Complaints: Neck a little sore. Has some secretions, "phlegm" still at times A  review of systems has been performed and if not noted above is otherwise negative.   Objective: Vital Signs: Blood pressure 117/77, pulse 75, temperature 97.9 F (36.6 C), temperature source Oral, resp. rate 18, height 5\' 5"  (1.651 m), weight 163.25 kg (359 lb 14.4 oz), SpO2 97.00%. No results found. No results found for this basename: WBC, HGB, HCT, PLT,  in the last 72 hours No results found for this basename: NA, K, CL, CO, GLUCOSE, BUN, CREATININE, CALCIUM,  in the last 72 hours CBG (last 3)   Recent Labs  08/02/13 0015  GLUCAP 126*    Wt Readings from Last 3 Encounters:  08/03/13 163.25 kg (359 lb 14.4 oz)  07/26/13 165.79 kg (365 lb 8 oz)  07/26/13 165.79 kg (365 lb 8 oz)    Physical Exam:  Constitutional: She is oriented to person, place, and time. Morbidly obese  HENT: dentition fair. Oral mucosa moist. Right ear with wax builde up. Neck slightly tender to touch. Right ear a little tender as well.  Head: Normocephalic.  Eyes: EOM are normal.  Neck: Normal range of motion. Neck supple. No thyromegaly present.  Cardiovascular: Normal rate and regular rhythm.  Respiratory: Effort normal and breath sounds normal. No respiratory distress.  GI: Soft. Bowel sounds are normal. Minimally tender Skin:  Line site healed. Non--irritated Psychiatric: She has a normal mood and affect.  Neuro:  5/5 Bilat Delt , Bi, tri, HI  3+/5 B HF, 4- KE, 4/5 Ankle DF/PF  Sensory intact except for median right hand, phalen's test + RUE  DTR's 1+ Ext no edema, no calf tenderness   Assessment/Plan: 1. Functional deficits secondary to deconditioning after gastrectomy/respiratory failure which require 3+ hours per day of interdisciplinary therapy in a comprehensive inpatient rehab setting. Physiatrist is providing close team  supervision and 24 hour management of active medical problems listed below. Physiatrist and rehab team continue to assess barriers to discharge/monitor patient progress toward functional and medical goals.  For dc tomorrow.   FIM: FIM - Bathing Bathing Steps Patient Completed: Chest;Right Arm;Left Arm;Abdomen;Front perineal area;Buttocks;Right upper leg;Left upper leg Bathing: 4: Min-Patient completes 8-9 55f 10 parts or 75+ percent  FIM - Upper Body Dressing/Undressing Upper body dressing/undressing steps patient completed: Thread/unthread right sleeve of pullover shirt/dresss;Thread/unthread left sleeve of pullover shirt/dress;Put head through opening of pull over shirt/dress Upper body dressing/undressing: 5: Set-up assist to: Obtain clothing/put away FIM - Lower Body Dressing/Undressing Lower body dressing/undressing steps patient completed: Thread/unthread right pants leg;Thread/unthread left pants leg;Pull pants up/down;Thread/unthread right underwear leg;Thread/unthread left underwear leg;Pull underwear up/down Lower body dressing/undressing: 4: Min-Patient completed 75 plus % of tasks  FIM - Toileting Toileting steps completed by patient: Adjust clothing prior to toileting;Performs perineal hygiene;Adjust clothing after toileting Toileting Assistive Devices: Grab bar or rail for support Toileting: 5: Supervision: Safety issues/verbal cues  FIM - Radio producer Devices: Walker;Elevated toilet seat Toilet Transfers: 5-To toilet/BSC: Supervision (verbal cues/safety issues);5-From toilet/BSC: Supervision (verbal cues/safety issues)  FIM - Engineer, site Assistive Devices: Arm rests;Walker Bed/Chair Transfer: 5: Sit > Supine: Supervision (verbal cues/safety issues);5: Chair or W/C > Bed: Supervision (verbal cues/safety issues);5: Bed > Chair or W/C: Supervision (verbal cues/safety issues);5: Supine > Sit: Supervision (verbal  cues/safety issues)  FIM - Locomotion: Wheelchair Distance: Pt demonstrating difficulty propelling w/c, B LE do not  touch floor and nails too long to use B UE Locomotion: Wheelchair: 0: Activity did not occur FIM - Locomotion: Ambulation Locomotion: Ambulation Assistive Devices: Administrator Ambulation/Gait Assistance: 5: Supervision Locomotion: Ambulation: 5: Travels 150 ft or more with supervision/safety issues  Comprehension Comprehension Mode: Auditory Comprehension: 7-Follows complex conversation/direction: With no assist  Expression Expression Mode: Verbal Expression: 6-Expresses complex ideas: With extra time/assistive device  Social Interaction Social Interaction: 5-Interacts appropriately 90% of the time - Needs monitoring or encouragement for participation or interaction.  Problem Solving Problem Solving: 5-Solves basic 90% of the time/requires cueing < 10% of the time  Memory Memory: 6-More than reasonable amt of time  Medical Problem List and Plan:  1. Functional deficits secondary to deconditioning/respiratory failure after laparoscopic sleeve gastrectomy 07/14/2013  2. DVT Prophylaxis/Anticoagulation: Subcutaneous heparin.   3. Pain Management: Tylenol as needed  4. History of asthma. Continue nebulizer inhalers as directed.  Encouraged FV/IS 5. Obstructive sleep apnea. Continue CPAP. Patient does not use her CPAP routinely at home secondary to dislike in the mask  6. Neuropsych: This patient is capable of making decisions on her own behalf.  7. CTS RUE: ---symptoms slightly improved---observation only    8.  R side neck pain, suspect neuritis after Central line- gabapentin has been helpful---pain better  -has hx of headaches (migraine) PTA  -kpad prn 9. Ear pain (R)- wax build up---debrox  LOS (Days) 7 A FACE TO FACE EVALUATION WAS PERFORMED  SWARTZ,ZACHARY T 08/03/2013 7:55 AM

## 2013-08-03 NOTE — Progress Notes (Signed)
Occupational Therapy Session Note  Patient Details  Name: Melissa Erickson MRN: [id number] Date of Birth: 1981-02-03  Today's Date: 08/03/2013  Session 1 Time: 0900-1000 Time Calculation (min): 60 min  Short Term Goals: Week 1:  OT Short Term Goal 1 (Week 1): STG=LTG due to short LOS  Skilled Therapeutic Interventions/Progress Updates:    Pt engaged in bathing and dressing tasks at mod I/I level.  Pt amb in room without AD to gather supplies/clothing supplies prior to bathing.  Pt amb to bathroom X 2 to use toilet during session.  Pt requires extra time to complete tasks and continues to fatigue.  Pt incorporates appropriate energy conservation strategies during activities.  Focus on discharge planning, activity tolerance, safety awareness, and functional amb without AD. Pt made mod I in room and RN staff notfied.   Therapy Documentation Precautions:  Precautions Precautions: Fall Restrictions Weight Bearing Restrictions: No Pain: Pain Assessment Pain Assessment: 0-10 Pain Score: 3  Pain Type: Neuropathic pain Pain Location: Neck Pain Orientation: Right Pain Descriptors / Indicators: Aching Pain Onset: On-going Pain Intervention(s): RN made aware  See FIM for current functional status  Therapy/Group: Individual Therapy  Session 2 Time: 9179-1505 Pt c/o 3/10 right lateral neck pain; RN aware Individual Therapy  Pt engaged in practicing tub bench transfer and bed mobility on regular bed and elevated surface.  Pt performed tasks at mod I level.  Pt amb with RW to therapy gym for BUE therex on UE ergometer 5 min X 3 at work load 3. Pt unable to increase RPMs past 25 during activity.  Pt required 4 min rest break between each set.  Focus on safety awareness, activity tolerance, and functional amb with and with AD.  [name] [facility] 08/03/2013, 10:08 AM

## 2013-08-03 NOTE — Progress Notes (Signed)
Patient refused CPAP at this time.

## 2013-08-04 MED ORDER — HYDROCODONE-ACETAMINOPHEN 5-325 MG PO TABS: 1.0000 | ORAL_TABLET | Freq: Four times a day (QID) | ORAL | Status: DC | PRN

## 2013-08-04 MED ORDER — CYANOCOBALAMIN 1000 MCG PO TABS: 1000.0000 ug | ORAL_TABLET | Freq: Every day | ORAL | Status: DC

## 2013-08-04 MED ORDER — PANTOPRAZOLE SODIUM 40 MG PO TBEC: 40.0000 mg | DELAYED_RELEASE_TABLET | Freq: Every day | ORAL | Status: DC

## 2013-08-04 MED ORDER — CALCIUM CARBONATE-VITAMIN D 500-200 MG-UNIT PO TABS: 1.0000 | ORAL_TABLET | Freq: Three times a day (TID) | ORAL | Status: DC

## 2013-08-04 MED ORDER — MOMETASONE FURO-FORMOTEROL FUM 200-5 MCG/ACT IN AERO: 2.0000 | INHALATION_SPRAY | Freq: Two times a day (BID) | RESPIRATORY_TRACT | Status: DC

## 2013-08-04 MED ORDER — GABAPENTIN 100 MG PO CAPS: 100.0000 mg | ORAL_CAPSULE | Freq: Three times a day (TID) | ORAL | Status: DC

## 2013-08-04 MED ORDER — POLYVINYL ALCOHOL 1.4 % OP SOLN: 2.0000 [drp] | OPHTHALMIC | Status: DC | PRN

## 2013-08-04 MED ORDER — ADULT MULTIVITAMIN W/MINERALS CH: 1.0000 | ORAL_TABLET | Freq: Two times a day (BID) | ORAL | Status: DC

## 2013-08-04 NOTE — Progress Notes (Signed)
PHYSICAL MEDICINE & REHABILITATION     PROGRESS NOTE    Subjective/Complaints: Ear still achy at times. Excited to be going home. A  review of systems has been performed and if not noted above is otherwise negative.   Objective: Vital Signs: Blood pressure 100/63, pulse 81, temperature 97.4 F (36.3 C), temperature source Oral, resp. rate 18, height 5\' 5"  (1.651 m), weight 161.753 kg (356 lb 9.6 oz), SpO2 98.00%. No results found. No results found for this basename: WBC, HGB, HCT, PLT,  in the last 72 hours No results found for this basename: NA, K, CL, CO, GLUCOSE, BUN, CREATININE, CALCIUM,  in the last 72 hours CBG (last 3)   Recent Labs  08/02/13 0015  GLUCAP 126*    Wt Readings from Last 3 Encounters:  08/04/13 161.753 kg (356 lb 9.6 oz)  07/26/13 165.79 kg (365 lb 8 oz)  07/26/13 165.79 kg (365 lb 8 oz)    Physical Exam:  Constitutional: She is oriented to person, place, and time. Morbidly obese  HENT: dentition fair. Oral mucosa moist. Right ear with wax builde up. Neck slightly tender to touch. Right ear a little tender as well.  Head: Normocephalic.  Eyes: EOM are normal.  Neck: Normal range of motion. Neck supple. No thyromegaly present.  Cardiovascular: Normal rate and regular rhythm.  Respiratory: Effort normal and breath sounds normal. No respiratory distress.  GI: Soft. Bowel sounds are normal. Minimally tender Skin:  Line site healed. Non--irritated Psychiatric: She has a normal mood and affect.  Neuro:  5/5 Bilat Delt , Bi, tri, HI  3+/5 B HF, 4- KE, 4/5 Ankle DF/PF  Sensory intact except for median right hand, phalen's test + RUE  DTR's 1+ Ext no edema, no calf tenderness   Assessment/Plan: 1. Functional deficits secondary to deconditioning after gastrectomy/respiratory failure which require 3+ hours per day of interdisciplinary therapy in a comprehensive inpatient rehab setting. Physiatrist is providing close team supervision and 24 hour  management of active medical problems listed below. Physiatrist and rehab team continue to assess barriers to discharge/monitor patient progress toward functional and medical goals.  For dc tomorrow.   FIM: FIM - Bathing Bathing Steps Patient Completed: Chest;Right Arm;Left Arm;Abdomen;Front perineal area;Buttocks;Right upper leg;Left upper leg;Right lower leg (including foot);Left lower leg (including foot) Bathing: 6: More than reasonable amount of time  FIM - Upper Body Dressing/Undressing Upper body dressing/undressing steps patient completed: Thread/unthread right sleeve of pullover shirt/dresss;Thread/unthread left sleeve of pullover shirt/dress;Put head through opening of pull over shirt/dress Upper body dressing/undressing: 7: Complete Independence: No helper FIM - Lower Body Dressing/Undressing Lower body dressing/undressing steps patient completed: Thread/unthread right underwear leg;Thread/unthread left underwear leg;Pull underwear up/down;Thread/unthread right pants leg;Don/Doff right sock;Fasten/unfasten pants;Pull pants up/down;Thread/unthread left pants leg;Don/Doff left sock;Don/Doff right shoe;Don/Doff left shoe Lower body dressing/undressing: 6: More than reasonable amount of time  FIM - Toileting Toileting steps completed by patient: Adjust clothing prior to toileting;Performs perineal hygiene;Adjust clothing after toileting Toileting Assistive Devices: Grab bar or rail for support Toileting: 6: More than reasonable amount of time  FIM - Radio producer Devices: Elevated toilet seat Toilet Transfers: 6-Assistive device: No helper  FIM - Control and instrumentation engineer Devices: Arm rests;Walker Bed/Chair Transfer: 7: Supine > Sit: No assist;7: Sit > Supine: No assist;6: Bed > Chair or W/C: No assist;6: Chair or W/C > Bed: No assist  FIM - Locomotion: Wheelchair Distance: Pt at ambulatory level Locomotion: Wheelchair: 0:  Activity did not occur FIM -  Locomotion: Ambulation Locomotion: Ambulation Assistive Devices: Administrator Ambulation/Gait Assistance: 6: Modified independent (Device/Increase time) Locomotion: Ambulation: 6: Travels 150 ft or more with assistive device/no helper  Comprehension Comprehension Mode: Auditory Comprehension: 7-Follows complex conversation/direction: With no assist  Expression Expression Mode: Verbal Expression: 6-Expresses complex ideas: With extra time/assistive device  Social Interaction Social Interaction: 5-Interacts appropriately 90% of the time - Needs monitoring or encouragement for participation or interaction.  Problem Solving Problem Solving: 5-Solves basic 90% of the time/requires cueing < 10% of the time  Memory Memory: 6-More than reasonable amt of time  Medical Problem List and Plan:  1. Functional deficits secondary to deconditioning/respiratory failure after laparoscopic sleeve gastrectomy 07/14/2013  2. DVT Prophylaxis/Anticoagulation: Subcutaneous heparin.   3. Pain Management: Tylenol as needed  4. History of asthma. Continue nebulizer inhalers as directed.  Encouraged FV/IS 5. Obstructive sleep apnea. Continue CPAP. Patient does not use her CPAP routinely at home secondary to dislike in the mask  6. Neuropsych: This patient is capable of making decisions on her own behalf.  7. CTS RUE: ---symptoms slightly improved---observation only    8.  R side neck pain, suspect neuritis after Central line- gabapentin has been helpful---pain better  -has hx of headaches (migraine) PTA  -heat prn 9. Ear pain (R)- wax build up---debrox---can continue at home  LOS (Days) 8 A FACE TO FACE EVALUATION WAS PERFORMED  Viktor Philipp T 08/04/2013 8:00 AM

## 2013-08-04 NOTE — Progress Notes (Signed)
Patient received discharge instructions from Marlowe Shores, PA-C with verbal instructions. Patient discharged to home with belongings.

## 2013-08-04 NOTE — Discharge Instructions (Signed)
Inpatient Rehab Discharge Instructions  Melissa Erickson Discharge date and time: No discharge date for patient encounter.   Activities/Precautions/ Functional Status: Activity: activity as tolerated Diet: bariartric diet Wound Care: none needed Functional status:  ___ No restrictions     ___ Walk up steps independently ___ 24/7 supervision/assistance   ___ Walk up steps with assistance ___ Intermittent supervision/assistance  ___ Bathe/dress independently ___ Walk with walker     ___ Bathe/dress with assistance ___ Walk Independently    ___ Shower independently _x__ Walk with assistance    ___ Shower with assistance ___ No alcohol     ___ Return to work/school ________     COMMUNITY REFERRALS UPON DISCHARGE:    Home Health:   PT                     Agency: [facility] Phone: [phone]   Medical Equipment/Items Ordered: walker, bedside commode                                                      Agency/Supplier: [facility] @ [phone]      Special Instructions:    My questions have been answered and I understand these instructions. I will adhere to these goals and the provided educational materials after my discharge from the hospital.  Patient/Caregiver Signature _______________________________ Date __________  Clinician Signature _______________________________________ Date __________  Please bring this form and your medication list with you to all your follow-up doctor's appointments.

## 2013-08-05 NOTE — Progress Notes (Signed)
Social Work  Discharge Note  The overall goal for the admission was met for:   Discharge location: Yes - home with godmother to assist as needed  Length of Stay: Yes - 8 days  Discharge activity level: Yes - modified independent  Home/community participation: Yes  Services provided included: MD, RD, PT, OT, RN, TR, Pharmacy and [place]: Private Insurance: [employer]  Follow-up services arranged: Home Health: PT via [facility], DME: bariatric rolling walker, heavy duty 3n1 commode via [facility] and Patient/Family has no preference for HH/DME agencies  Comments (or additional information):  Patient/Family verbalized understanding of follow-up arrangements: Yes  Individual responsible for coordination of the follow-up plan: patient  Confirmed correct DME delivered: HOYLE, LUCY 08/05/2013    HOYLE, LUCY

## 2013-08-08 DIAGNOSIS — Z8669 Personal history of other diseases of the nervous system and sense organs: Secondary | ICD-10-CM | POA: Insufficient documentation

## 2013-08-15 ENCOUNTER — Telehealth: Payer: Self-pay | Admitting: *Deleted

## 2013-08-15 NOTE — Telephone Encounter (Signed)
Calling to get verbal order for skilled nursing 1xwk4 for incision management and teaching.  Approval given.

## 2013-12-02 ENCOUNTER — Other Ambulatory Visit: Payer: Self-pay

## 2014-06-19 ENCOUNTER — Encounter (HOSPITAL_BASED_OUTPATIENT_CLINIC_OR_DEPARTMENT_OTHER): Payer: Self-pay

## 2014-06-19 ENCOUNTER — Emergency Department (HOSPITAL_BASED_OUTPATIENT_CLINIC_OR_DEPARTMENT_OTHER)
Admission: EM | Admit: 2014-06-19 | Discharge: 2014-06-19 | Disposition: A | Discharge status: Home / Self Care | Payer: Self-pay | Attending: Emergency Medicine | Admitting: Emergency Medicine

## 2014-06-19 DIAGNOSIS — J45909 Unspecified asthma, uncomplicated: Secondary | ICD-10-CM | POA: Insufficient documentation

## 2014-06-19 DIAGNOSIS — L02412 Cutaneous abscess of left axilla: Secondary | ICD-10-CM | POA: Insufficient documentation

## 2014-06-19 DIAGNOSIS — Z79899 Other long term (current) drug therapy: Secondary | ICD-10-CM | POA: Insufficient documentation

## 2014-06-19 DIAGNOSIS — Z8639 Personal history of other endocrine, nutritional and metabolic disease: Secondary | ICD-10-CM | POA: Insufficient documentation

## 2014-06-19 DIAGNOSIS — Z8679 Personal history of other diseases of the circulatory system: Secondary | ICD-10-CM | POA: Insufficient documentation

## 2014-06-19 MED ORDER — LIDOCAINE HCL 2 % IJ SOLN
INTRAMUSCULAR | Status: DC
Start: 2014-06-19 — End: 2014-06-19
  Filled 2014-06-19: qty 20

## 2014-06-19 MED ORDER — SULFAMETHOXAZOLE-TRIMETHOPRIM 800-160 MG PO TABS: 1.0000 | ORAL_TABLET | Freq: Two times a day (BID) | ORAL | Status: AC

## 2014-06-19 NOTE — ED Provider Notes (Signed)
CSN: [id number]     Arrival date & time 06/19/14  1437 History   First MD Initiated Contact with Patient 06/19/14 1501     Chief Complaint  Patient presents with  . Abscess     (Consider location/radiation/quality/duration/timing/severity/associated sxs/prior Treatment) HPI  Melissa Erickson is a 34 y.o. female presenting with left abscess to the sella for 1 week. Patient states last section abscess here was one year ago. Patient has noted spontaneous treated for here in the emergency room. Is associated with pain is worse with movement and palpation. She denies alleviating factors. No history of diabetes. No fevers, chills, nausea, vomiting. No recent antibiotic use.   Past Medical History  Diagnosis Date  . Polycystic ovarian syndrome   . Asthma   . Migraine    Past Surgical History  Procedure Laterality Date  . Cholecystectomy    . Cesarean section    . Laparoscopic gastric sleeve resection    . Laparoscopy N/A 07/19/2013    Procedure: LAPAROSCOPY DIAGNOSTIC;  Surgeon: Harl Bowie, MD;  Location: [facility];  Service: General;  Laterality: N/A;  . Esophagogastroduodenoscopy N/A 07/19/2013    Procedure: ESOPHAGOGASTRODUODENOSCOPY (EGD);  Surgeon: Harl Bowie, MD;  Location: [facility];  Service: General;  Laterality: N/A;   No family history on file. History  Substance Use Topics  . Smoking status: Never Smoker   . Smokeless tobacco: Not on file  . Alcohol Use: No   OB History    No data available     Review of Systems  Constitutional: Negative for fever and chills.  Gastrointestinal: Negative for nausea and vomiting.  Skin: Negative for color change and wound.      Allergies  Review of patient's allergies indicates no known allergies.  Home Medications   Prior to Admission medications   Medication Sig Start Date End Date Taking? Authorizing Provider  ADVAIR DISKUS 100-50 MCG/DOSE AEPB Inhale 2 puffs into the lungs 2 (two) times daily.  07/10/13   Historical  Provider, MD  albuterol (PROVENTIL HFA;VENTOLIN HFA) 108 (90 BASE) MCG/ACT inhaler Inhale 2 puffs into the lungs every 6 (six) hours as needed for wheezing.    Historical Provider, MD  sulfamethoxazole-trimethoprim (BACTRIM DS,SEPTRA DS) 800-160 MG per tablet Take 1 tablet by mouth 2 (two) times daily. 06/19/14 06/26/14  Al Corpus, PA-C   BP 128/81 mmHg  Pulse 70  Temp(Src) 98.6 F (37 C) (Oral)  Resp 20  Ht 5\' 5"  (1.651 m)  Wt 317 lb (143.79 kg)  BMI 52.75 kg/m2  SpO2 100%  LMP 05/28/2014 Physical Exam  Constitutional: She appears well-developed and well-nourished. No distress.  HENT:  Head: Normocephalic and atraumatic.  Eyes: Conjunctivae are normal. Right eye exhibits no discharge. Left eye exhibits no discharge.  Pulmonary/Chest: Effort normal. No respiratory distress.  Neurological: She is alert. Coordination normal.  Skin: She is not diaphoretic.  2 cm area of fluctuance to left axilla with surrounding induration and mild signs of cellulitis. No red streaks.  Psychiatric: She has a normal mood and affect. Her behavior is normal.  Nursing note and vitals reviewed.   ED Course  Procedures (including critical care time) Labs Review Labs Reviewed - No data to display  Imaging Review No results found.   EKG Interpretation None       INCISION AND DRAINAGE Performed by: Al Corpus Consent: Verbal consent obtained. Risks and benefits: risks, benefits and alternatives were discussed Type: abscess  Body area: left axilla  Anesthesia: local infiltration  Incision was made with a scalpel.  Local anesthetic: lidocaine 2% w/o epinephrine  Anesthetic total: 3 ml  Complexity: complex Blunt dissection to break up loculations  Drainage: purulent  Drainage amount: copious  Packing material: 1/4 in iodoform gauze  Patient tolerance: Patient tolerated the procedure well with no immediate complications.    Meds given in ED:  Medications - No data to  display  Discharge Medication List as of 06/19/2014  4:06 PM    START taking these medications   Details  sulfamethoxazole-trimethoprim (BACTRIM DS,SEPTRA DS) 800-160 MG per tablet Take 1 tablet by mouth 2 (two) times daily., Starting 06/19/2014, Until Mon 06/26/14, Print          MDM   Final diagnoses:  Abscess of axilla, left   Patient with skin abscess amenable to incision and drainage.  Abscess with packing in place,  wound recheck in 2 days. Encouraged home warm soaks and flushing.  Mild signs of cellulitis is surrounding skin.  Due to recurrent nature of abscess with discharge with Bactrim. Will d/c to home.    Discussed return precautions with patient. Discussed all results and patient verbalizes understanding and agrees with plan.   Al Corpus, PA-C 06/21/14 [place], MD 06/21/14 [phone]

## 2014-06-19 NOTE — ED Notes (Signed)
Left axilla abscess x 1 week

## 2014-06-19 NOTE — ED Notes (Signed)
pa at bedside. 

## 2014-06-19 NOTE — Discharge Instructions (Signed)
Return to the emergency room with worsening of symptoms, new symptoms or with symptoms that are concerning, especially fevers, redness, swelling, red streaks, nausea vomiting generalized unwell. Please take all of your antibiotics until finished!   You may develop abdominal discomfort or diarrhea from the antibiotic.  You may help offset this with probiotics which you can buy or get in yogurt. Do not eat  or take the probiotics until 2 hours after your antibiotic.  Follow up with PCP/urgent care or here for wound recheck in 2 days. Continue with warm soaks.\ Read below information and follow recommendations.  Abscess Care After An abscess (also called a boil or furuncle) is an infected area that contains a collection of pus. Signs and symptoms of an abscess include pain, tenderness, redness, or hardness, or you may feel a moveable soft area under your skin. An abscess can occur anywhere in the body. The infection may spread to surrounding tissues causing cellulitis. A cut (incision) by the surgeon was made over your abscess and the pus was drained out. Gauze may have been packed into the space to provide a drain that will allow the cavity to heal from the inside outwards. The boil may be painful for 5 to 7 days. Most people with a boil do not have high fevers. Your abscess, if seen early, may not have localized, and may not have been lanced. If not, another appointment may be required for this if it does not get better on its own or with medications. HOME CARE INSTRUCTIONS   Only take over-the-counter or prescription medicines for pain, discomfort, or fever as directed by your caregiver.  When you bathe, soak and then remove gauze or iodoform packs at least daily or as directed by your caregiver. You may then wash the wound gently with mild soapy water. Repack with gauze or do as your caregiver directs. SEEK IMMEDIATE MEDICAL CARE IF:   You develop increased pain, swelling, redness, drainage, or  bleeding in the wound site.  You develop signs of generalized infection including muscle aches, chills, fever, or a general ill feeling.  An oral temperature above 102 F (38.9 C) develops, not controlled by medication. See your caregiver for a recheck if you develop any of the symptoms described above. If medications (antibiotics) were prescribed, take them as directed. Document Released: 08/22/2004 Document Revised: 04/28/2011 Document Reviewed: 04/19/2007 [facility] Patient Information 2015 [place], [place]. This information is not intended to replace advice given to you by your health care provider. Make sure you discuss any questions you have with your health care provider.

## 2014-08-04 ENCOUNTER — Encounter (HOSPITAL_BASED_OUTPATIENT_CLINIC_OR_DEPARTMENT_OTHER): Payer: Self-pay | Admitting: *Deleted

## 2014-08-04 ENCOUNTER — Emergency Department (HOSPITAL_BASED_OUTPATIENT_CLINIC_OR_DEPARTMENT_OTHER)
Admission: EM | Admit: 2014-08-04 | Discharge: 2014-08-04 | Disposition: A | Discharge status: Home / Self Care | Payer: Self-pay | Attending: Emergency Medicine | Admitting: Emergency Medicine

## 2014-08-04 DIAGNOSIS — Z79899 Other long term (current) drug therapy: Secondary | ICD-10-CM | POA: Insufficient documentation

## 2014-08-04 DIAGNOSIS — R0981 Nasal congestion: Secondary | ICD-10-CM | POA: Insufficient documentation

## 2014-08-04 DIAGNOSIS — Z8639 Personal history of other endocrine, nutritional and metabolic disease: Secondary | ICD-10-CM | POA: Insufficient documentation

## 2014-08-04 DIAGNOSIS — J45909 Unspecified asthma, uncomplicated: Secondary | ICD-10-CM | POA: Insufficient documentation

## 2014-08-04 DIAGNOSIS — Z8679 Personal history of other diseases of the circulatory system: Secondary | ICD-10-CM | POA: Insufficient documentation

## 2014-08-04 DIAGNOSIS — J3489 Other specified disorders of nose and nasal sinuses: Secondary | ICD-10-CM | POA: Insufficient documentation

## 2014-08-04 DIAGNOSIS — Z8701 Personal history of pneumonia (recurrent): Secondary | ICD-10-CM | POA: Insufficient documentation

## 2014-08-04 DIAGNOSIS — E669 Obesity, unspecified: Secondary | ICD-10-CM | POA: Insufficient documentation

## 2014-08-04 MED ORDER — FLUTICASONE PROPIONATE 50 MCG/ACT NA SUSP: 2.0000 | Freq: Every day | NASAL | Status: DC

## 2014-08-04 MED ORDER — PSEUDOEPHEDRINE HCL ER 120 MG PO TB12: 120.0000 mg | ORAL_TABLET | Freq: Two times a day (BID) | ORAL | Status: DC

## 2014-08-04 NOTE — Discharge Instructions (Signed)

## 2014-08-04 NOTE — ED Provider Notes (Signed)
CSN: [id number]     Arrival date & time 08/04/14  1925 History  This chart was scribed for Dorie Rank, MD by Delphia Grates, ED Scribe. This patient was seen in room MH02/MH02 and the patient's care was started at 8:14 PM.    Chief Complaint  Patient presents with  . URI    The history is provided by the patient. No language interpreter was used.     HPI Comments: Melissa Erickson is a 34 y.o. female who presents to the Emergency Department complaining of gradually worsening, moderate sinus pressure for the past 2 days. Patient states her symptoms were intermittent upon initial onset, before gradually becoming more constant. There is associated congestion and facial burning sensation. Patient has tried OTC allergy and sinus medications without relief. She reports history of asthma and pneumonia and utilizes an albuterol inhaler as needed. She denies fever, drainage, vomiting, diarrhea, or cough.   Past Medical History  Diagnosis Date  . Polycystic ovarian syndrome   . Asthma   . Migraine    Past Surgical History  Procedure Laterality Date  . Cholecystectomy    . Cesarean section    . Laparoscopic gastric sleeve resection    . Laparoscopy N/A 07/19/2013    Procedure: LAPAROSCOPY DIAGNOSTIC;  Surgeon: Harl Bowie, MD;  Location: [facility];  Service: General;  Laterality: N/A;  . Esophagogastroduodenoscopy N/A 07/19/2013    Procedure: ESOPHAGOGASTRODUODENOSCOPY (EGD);  Surgeon: Harl Bowie, MD;  Location: [facility];  Service: General;  Laterality: N/A;   No family history on file. History  Substance Use Topics  . Smoking status: Never Smoker   . Smokeless tobacco: Not on file  . Alcohol Use: No   OB History    No data available     Review of Systems  Constitutional: Negative for fever.  HENT: Positive for congestion and sinus pressure. Negative for rhinorrhea.   Respiratory: Negative for cough.   Gastrointestinal: Negative for vomiting and diarrhea.  All other systems  reviewed and are negative.     Allergies  Review of patient's allergies indicates no known allergies.  Home Medications   Prior to Admission medications   Medication Sig Start Date End Date Taking? Authorizing Provider  ADVAIR DISKUS 100-50 MCG/DOSE AEPB Inhale 2 puffs into the lungs 2 (two) times daily.  07/10/13   Historical Provider, MD  albuterol (PROVENTIL HFA;VENTOLIN HFA) 108 (90 BASE) MCG/ACT inhaler Inhale 2 puffs into the lungs every 6 (six) hours as needed for wheezing.    Historical Provider, MD  fluticasone (FLONASE) 50 MCG/ACT nasal spray Place 2 sprays into both nostrils daily. 08/04/14   Dorie Rank, MD  pseudoephedrine (SUDAFED 12 HOUR) 120 MG 12 hr tablet Take 1 tablet (120 mg total) by mouth every 12 (twelve) hours. 08/04/14   Dorie Rank, MD   Triage Vitals: BP 129/58 mmHg  Pulse 74  Temp(Src) 98.2 F (36.8 C) (Oral)  Resp 18  Ht 5\' 5"  (1.651 m)  Wt 317 lb (143.79 kg)  BMI 52.75 kg/m2  SpO2 100%  LMP 08/01/2014  Physical Exam  Constitutional: No distress.  Obese   HENT:  Head: Normocephalic and atraumatic.  Right Ear: External ear normal.  Left Ear: External ear normal.  Nose: Right sinus exhibits maxillary sinus tenderness. Left sinus exhibits maxillary sinus tenderness.  Boggy nasal turbinates and mild maxillary sinus tenderness.  Eyes: Conjunctivae are normal. Right eye exhibits no discharge. Left eye exhibits no discharge. No scleral icterus.  Neck: Neck supple. No  tracheal deviation present.  Cardiovascular: Normal rate, regular rhythm and intact distal pulses.   Pulmonary/Chest: Effort normal and breath sounds normal. No stridor. No respiratory distress. She has no wheezes. She has no rales.  Abdominal: Soft. Bowel sounds are normal. She exhibits no distension. There is no tenderness. There is no rebound and no guarding.  Musculoskeletal: She exhibits no edema or tenderness.  Neurological: She is alert. She has normal strength. No cranial nerve deficit  (no facial droop, extraocular movements intact, no slurred speech) or sensory deficit. She exhibits normal muscle tone. She displays no seizure activity. Coordination normal.  Skin: Skin is warm and dry. No rash noted.  Psychiatric: She has a normal mood and affect.  Nursing note and vitals reviewed.   ED Course  Procedures (including critical care time)  DIAGNOSTIC STUDIES: Oxygen Saturation is 100% on room air, normal by my interpretation.    COORDINATION OF CARE: At 2018 Discussed treatment plan with patient which includes Flonase and pseudoephedrine Patient agrees.    Labs Review Labs Reviewed - No data to display  Imaging Review No results found.   EKG Interpretation None      MDM   Final diagnoses:  Sinus congestion    Will rx flonase and sudafed.  Continue antihistamines.  Doubt bacterial infection.  I personally performed the services described in this documentation, which was scribed in my presence.  The recorded information has been reviewed and is accurate.    Dorie Rank, MD 08/04/14 2023

## 2014-08-04 NOTE — ED Notes (Signed)
Headache and facial pain x 3 days. Feels like she has a sinus infection.

## 2014-08-04 NOTE — ED Notes (Signed)
C/o facial tightness and burning off and on x 2 weeks  Worse x 3 days,  Denies cough or drainage

## 2014-11-01 ENCOUNTER — Emergency Department (HOSPITAL_BASED_OUTPATIENT_CLINIC_OR_DEPARTMENT_OTHER)
Admission: EM | Admit: 2014-11-01 | Discharge: 2014-11-01 | Discharge status: Against Medical Advice | Payer: Self-pay | Attending: Emergency Medicine | Admitting: Emergency Medicine

## 2014-11-01 ENCOUNTER — Encounter (HOSPITAL_BASED_OUTPATIENT_CLINIC_OR_DEPARTMENT_OTHER): Payer: Self-pay

## 2014-11-01 DIAGNOSIS — J45901 Unspecified asthma with (acute) exacerbation: Secondary | ICD-10-CM | POA: Insufficient documentation

## 2014-11-01 DIAGNOSIS — R05 Cough: Secondary | ICD-10-CM | POA: Insufficient documentation

## 2014-11-01 DIAGNOSIS — H9203 Otalgia, bilateral: Secondary | ICD-10-CM | POA: Insufficient documentation

## 2014-11-01 NOTE — ED Notes (Signed)
Pt approached Renee Rival, RN and stated that she had to leave to pick up her child who is sick at school. Pt in NAD, speaking in full sentences, and walking with a quick, steady gait.

## 2014-11-01 NOTE — ED Notes (Signed)
C/o cough, bilat earache and SOB with no relief with "rescue inhaler"-pt NAD

## 2015-04-17 ENCOUNTER — Other Ambulatory Visit (HOSPITAL_COMMUNITY): Payer: Self-pay | Admitting: Obstetrics and Gynecology

## 2015-04-17 DIAGNOSIS — Z3169 Encounter for other general counseling and advice on procreation: Secondary | ICD-10-CM

## 2015-04-18 ENCOUNTER — Other Ambulatory Visit (HOSPITAL_COMMUNITY): Payer: Self-pay | Admitting: Obstetrics and Gynecology

## 2015-04-18 DIAGNOSIS — Z3169 Encounter for other general counseling and advice on procreation: Secondary | ICD-10-CM

## 2015-04-23 ENCOUNTER — Ambulatory Visit (HOSPITAL_COMMUNITY)
Admission: RE | Admit: 2015-04-23 | Discharge: 2015-04-23 | Disposition: A | Discharge status: Home / Self Care | Payer: BLUE CROSS/BLUE SHIELD | Source: Ambulatory Visit | Attending: Obstetrics and Gynecology | Admitting: Obstetrics and Gynecology

## 2015-04-23 DIAGNOSIS — Z3169 Encounter for other general counseling and advice on procreation: Secondary | ICD-10-CM

## 2015-04-23 DIAGNOSIS — N979 Female infertility, unspecified: Secondary | ICD-10-CM | POA: Insufficient documentation

## 2015-04-23 MED ORDER — IOHEXOL 300 MG/ML  SOLN
30.0000 mL | Freq: Once | INTRAMUSCULAR | Status: AC | PRN
  Administered 2015-04-23: 30 mL

## 2015-10-15 ENCOUNTER — Other Ambulatory Visit: Payer: Self-pay | Admitting: Obstetrics and Gynecology

## 2015-10-15 ENCOUNTER — Ambulatory Visit
Admission: RE | Admit: 2015-10-15 | Discharge: 2015-10-15 | Disposition: A | Discharge status: Home / Self Care | Payer: BLUE CROSS/BLUE SHIELD | Source: Ambulatory Visit | Attending: Obstetrics and Gynecology | Admitting: Obstetrics and Gynecology

## 2015-10-15 DIAGNOSIS — R1031 Right lower quadrant pain: Secondary | ICD-10-CM

## 2016-03-11 ENCOUNTER — Encounter (HOSPITAL_BASED_OUTPATIENT_CLINIC_OR_DEPARTMENT_OTHER): Payer: Self-pay

## 2016-03-11 ENCOUNTER — Emergency Department (HOSPITAL_BASED_OUTPATIENT_CLINIC_OR_DEPARTMENT_OTHER)
Admission: EM | Admit: 2016-03-11 | Discharge: 2016-03-11 | Disposition: A | Discharge status: Home / Self Care | Payer: BLUE CROSS/BLUE SHIELD | Attending: Emergency Medicine | Admitting: Emergency Medicine

## 2016-03-11 DIAGNOSIS — I110 Hypertensive heart disease with heart failure: Secondary | ICD-10-CM | POA: Diagnosis not present

## 2016-03-11 DIAGNOSIS — I509 Heart failure, unspecified: Secondary | ICD-10-CM | POA: Insufficient documentation

## 2016-03-11 DIAGNOSIS — J45909 Unspecified asthma, uncomplicated: Secondary | ICD-10-CM | POA: Insufficient documentation

## 2016-03-11 DIAGNOSIS — R509 Fever, unspecified: Secondary | ICD-10-CM | POA: Diagnosis present

## 2016-03-11 DIAGNOSIS — R6889 Other general symptoms and signs: Secondary | ICD-10-CM

## 2016-03-11 DIAGNOSIS — J029 Acute pharyngitis, unspecified: Secondary | ICD-10-CM | POA: Insufficient documentation

## 2016-03-11 LAB — RAPID STREP SCREEN (MED CTR MEBANE ONLY): Streptococcus, Group A Screen (Direct): NEGATIVE

## 2016-03-11 MED ORDER — IBUPROFEN 800 MG PO TABS
800.0000 mg | ORAL_TABLET | Freq: Once | ORAL | Status: AC
  Administered 2016-03-11: 800 mg via ORAL
  Filled 2016-03-11: qty 1

## 2016-03-11 MED ORDER — IBUPROFEN 800 MG PO TABS: 800.0000 mg | ORAL_TABLET | Freq: Three times a day (TID) | ORAL | 0 refills | Status: DC | PRN

## 2016-03-11 NOTE — Discharge Instructions (Signed)
Ibuprofen as needed for bodyaches and/or fever. If any further fever control, please take Tylenol. Follow up with your doctor in regards to your hospital visit. Return to the emergency department if symptoms worsen, become progressive, or become more concerning. Read below to learn more about your diagnosis & reasons to return.   Influenza, Adult   Influenza (flu)  starts suddenly, usually with a fever. It causes chills, dry and hacking cough, headache, body aches, and sore throat. Influenza spreads easily from one person to another.   HOME CARE  Only take medicines as told by your doctor.  Rest.  Drink enough fluids to keep your urine clear or pale yellow.  Wash your hands often. Do this after you blow your nose, after you go to the bathroom, and before you touch food.   GET HELP RIGHT AWAY IF:  You have shortness of breath while resting.  You have pain or pressure in the chest or belly (abdomen).  You suddenly feel dizzy.  You feel confused.  You have a hard time breathing.  Your skin or nails turn bluish in color.  You get a bad neck pain or stiffness.  You get a bad headache, face pain, or earache.  You throw up (vomit) a lot and often.  You have a fever > 101 that persists   MAKE SURE YOU:  Understand these instructions.  Will watch your condition.  Will get help right away if you are not doing well or get worse.

## 2016-03-11 NOTE — ED Notes (Signed)
ED Provider at bedside. 

## 2016-03-11 NOTE — ED Triage Notes (Signed)
C/o chills, sore throat started yesterday-NAD-stead gait

## 2016-03-11 NOTE — ED Provider Notes (Signed)
[facility] DEPT [facility] Provider Note   CSN: [id number] Arrival date & time: 03/11/16  1155     History   Chief Complaint Chief Complaint  Patient presents with  . Chills    HPI Melissa Erickson is a 36 y.o. female.  The history is provided by the patient and medical records. No language interpreter was used.   Kennya R Erickson is a 36 y.o. female  with a PMH of asthma, PCOS, CHF, CAD who presents to the Emergency Department complaining of acute onset of generalized body aches, fever Tmax 102, chills, sore throat last night. She took Tylenol Cold and flu with little relief. 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 She did not get her flu shot this year. She denies cough, nasal congestion, shortness of breath, chest pain, abdominal pain, nausea, vomiting or any other associated symptoms. No alleviating or aggravating factors were noted.   Past Medical History:  Diagnosis Date  . Asthma   . Migraine   . Polycystic ovarian syndrome     Patient Active Problem List   Diagnosis Date Noted  . Physical deconditioning 07/28/2013  . Acute respiratory failure ([place]) 07/20/2013  . NSTEMI (non-ST elevated myocardial infarction), type 2 secondary to demand ischemia sue to sepsis 07/20/2013  . Morbid obesity ([place]) 07/18/2013  . ARDS (adult respiratory distress syndrome) ([place]) 07/18/2013  . Hypertension 07/18/2013  . CHF (congestive heart failure) ([place]) 07/18/2013    Past Surgical History:  Procedure Laterality Date  . CESAREAN SECTION    . CHOLECYSTECTOMY    . ESOPHAGOGASTRODUODENOSCOPY N/A 07/19/2013   Procedure: ESOPHAGOGASTRODUODENOSCOPY (EGD);  Surgeon: Harl Bowie, MD;  Location: [facility];  Service: General;  Laterality: N/A;  . LAPAROSCOPIC GASTRIC SLEEVE RESECTION    . LAPAROSCOPY N/A 07/19/2013   Procedure: LAPAROSCOPY DIAGNOSTIC;  Surgeon: Harl Bowie, MD;  Location: [facility];  Service: General;  Laterality:  N/A;    OB History    No data available       Home Medications    Prior to Admission medications   Medication Sig Start Date End Date Taking? Authorizing Provider  ibuprofen (ADVIL,MOTRIN) 800 MG tablet Take 1 tablet (800 mg total) by mouth every 8 (eight) hours as needed for fever or moderate pain. 03/11/16   [place], PA-C    Family History No family history on file.  Social History Social History  Substance Use Topics  . Smoking status: Never Smoker  . Smokeless tobacco: Never Used  . Alcohol use No     Allergies   Patient has no known allergies.   Review of Systems Review of Systems  Constitutional: Positive for chills and fever.  HENT: Positive for sore throat. Negative for congestion and trouble swallowing.   Respiratory: Negative for cough and shortness of breath.   Cardiovascular: Negative for chest pain.  Gastrointestinal: Negative for abdominal pain, constipation, diarrhea, nausea and vomiting.  Musculoskeletal: Positive for myalgias.     Physical Exam Updated Vital Signs BP 147/90 (BP Location: Left Arm)   Pulse 109   Temp (S) 99.6 F (37.6 C)   Resp 18   Ht 5\' 4"  (1.626 m)   Wt (!) 145.2 kg   LMP 02/26/2016   SpO2 100%   BMI 54.93 kg/m   Physical Exam  Constitutional: She is oriented to person, place, and time. She appears well-developed and well-nourished. No distress.  HENT:  Head: Normocephalic and  atraumatic.  OP with erythema, no exudates or tonsillar hypertrophy.  Neck: Normal range of motion. Neck supple.  No meningeal signs.   Cardiovascular: Normal heart sounds.   Mildly tachycardic but regular.  Pulmonary/Chest: Effort normal.  Lungs are clear to auscultation bilaterally - no w/r/r  Abdominal: Soft. She exhibits no distension. There is no tenderness.  Musculoskeletal:  No tenderness to upper or lower extremities, full range of motion without pain.  Neurological: She is alert and oriented to person, place, and time.    Skin: Skin is warm. No rash noted. She is not diaphoretic.  Nursing note and vitals reviewed.    ED Treatments / Results  Labs (all labs ordered are listed, but only abnormal results are displayed) Labs Reviewed  RAPID STREP SCREEN (NOT AT [facility])  CULTURE, GROUP A STREP [facility])    EKG  EKG Interpretation None       Radiology No results found.  Procedures Procedures (including critical care time)  Medications Ordered in ED Medications  ibuprofen (ADVIL,MOTRIN) tablet 800 mg (800 mg Oral Given 03/11/16 1221)     Initial Impression / Assessment and Plan / ED Course  I have reviewed the triage vital signs and the nursing notes.  Pertinent labs & imaging results that were available during my care of the patient were reviewed by me and considered in my medical decision making (see chart for details).    Patient with symptoms consistent with influenza. Vitals are stable, low-grade fever. No signs of dehydration, tolerating PO's. Lungs are clear.  Discussed the cost versus benefit of Tamiflu treatment with the patient who would like to defer treatment at this time. Patient is a 36 yo female with no history of immune compromised medical conditions. Patient will be discharged with instructions to orally hydrate, rest, and use over-the-counter medications such as anti-inflammatories ibuprofen and Aleve for muscle aches and Tylenol for fever. Patient will also be given a cough suppressant.    Final Clinical Impressions(s) / ED Diagnoses   Final diagnoses:  Influenza-like symptoms    New Prescriptions New Prescriptions   IBUPROFEN (ADVIL,MOTRIN) 800 MG TABLET    Take 1 tablet (800 mg total) by mouth every 8 (eight) hours as needed for fever or moderate pain.     [facility] Vanessa Alesi, PA-C 03/11/16 [place], MD 03/13/16 1640

## 2016-03-12 LAB — CULTURE, GROUP A STREP (THRC)

## 2016-08-23 ENCOUNTER — Encounter (HOSPITAL_BASED_OUTPATIENT_CLINIC_OR_DEPARTMENT_OTHER): Payer: Self-pay | Admitting: Emergency Medicine

## 2016-08-23 ENCOUNTER — Emergency Department (HOSPITAL_BASED_OUTPATIENT_CLINIC_OR_DEPARTMENT_OTHER)
Admission: EM | Admit: 2016-08-23 | Discharge: 2016-08-23 | Disposition: A | Discharge status: Home / Self Care | Payer: BLUE CROSS/BLUE SHIELD | Attending: Emergency Medicine | Admitting: Emergency Medicine

## 2016-08-23 DIAGNOSIS — I110 Hypertensive heart disease with heart failure: Secondary | ICD-10-CM | POA: Diagnosis not present

## 2016-08-23 DIAGNOSIS — R55 Syncope and collapse: Secondary | ICD-10-CM

## 2016-08-23 DIAGNOSIS — R42 Dizziness and giddiness: Secondary | ICD-10-CM | POA: Diagnosis not present

## 2016-08-23 DIAGNOSIS — E860 Dehydration: Secondary | ICD-10-CM

## 2016-08-23 DIAGNOSIS — I509 Heart failure, unspecified: Secondary | ICD-10-CM | POA: Insufficient documentation

## 2016-08-23 DIAGNOSIS — J45909 Unspecified asthma, uncomplicated: Secondary | ICD-10-CM | POA: Diagnosis not present

## 2016-08-23 NOTE — Discharge Instructions (Signed)
Eat and drink well for the next couple of days.  Follow up with your PCP.  °

## 2016-08-23 NOTE — ED Triage Notes (Signed)
Patient states that she is having intermittent dizzy feeling since she was at work on Thursday and she was hot. The patient reports that after drinking gatorade on Thursday it was better, but the feeling came back today

## 2016-08-23 NOTE — ED Provider Notes (Signed)
[facility] DEPT [facility] Provider Note   CSN: [id number] Arrival date & time: 08/23/16  1146     History   Chief Complaint Chief Complaint  Patient presents with  . Dizziness    HPI Melissa Erickson is a 36 y.o. female.  36 yo F with a chief complaint of feeling like she would pass out. This is occurred twice in the past week. Both times while at rest. Denied chest pain shortness of breath diaphoresis headache. She did feeling mild tingling. Denied feeling extremely anxious of these events. After the initial event she drinks Gatorade and felt much better. She currently feels much better after being in the ED.   The history is provided by the patient.  Dizziness  Associated symptoms: no chest pain, no headaches, no nausea, no palpitations, no shortness of breath and no vomiting   Illness  This is a new problem. The current episode started less than 1 hour ago. The problem occurs constantly. The problem has not changed since onset.Pertinent negatives include no chest pain, no headaches and no shortness of breath. Nothing aggravates the symptoms. Nothing relieves the symptoms. She has tried nothing for the symptoms. The treatment provided no relief.    Past Medical History:  Diagnosis Date  . Asthma   . Migraine   . Polycystic ovarian syndrome     Patient Active Problem List   Diagnosis Date Noted  . Physical deconditioning 07/28/2013  . Acute respiratory failure ([place]) 07/20/2013  . NSTEMI (non-ST elevated myocardial infarction), type 2 secondary to demand ischemia sue to sepsis 07/20/2013  . Morbid obesity ([place]) 07/18/2013  . ARDS (adult respiratory distress syndrome) ([place]) 07/18/2013  . Hypertension 07/18/2013  . CHF (congestive heart failure) ([place]) 07/18/2013    Past Surgical History:  Procedure Laterality Date  . CESAREAN SECTION    . CHOLECYSTECTOMY    . ESOPHAGOGASTRODUODENOSCOPY N/A 07/19/2013   Procedure: ESOPHAGOGASTRODUODENOSCOPY (EGD);  Surgeon: Harl Bowie, MD;  Location: [facility];  Service: General;  Laterality: N/A;  . LAPAROSCOPIC GASTRIC SLEEVE RESECTION    . LAPAROSCOPY N/A 07/19/2013   Procedure: LAPAROSCOPY DIAGNOSTIC;  Surgeon: Harl Bowie, MD;  Location: [facility];  Service: General;  Laterality: N/A;    OB History    No data available       Home Medications    Prior to Admission medications   Medication Sig Start Date End Date Taking? Authorizing Provider  ibuprofen (ADVIL,MOTRIN) 800 MG tablet Take 1 tablet (800 mg total) by mouth every 8 (eight) hours as needed for fever or moderate pain. 03/11/16   Ward, Ozella Almond, PA-C    Family History History reviewed. No pertinent family history.  Social History Social History  Substance Use Topics  . Smoking status: Never Smoker  . Smokeless tobacco: Never Used  . Alcohol use No     Allergies   Patient has no known allergies.   Review of Systems Review of Systems  Constitutional: Negative for chills and fever.  HENT: Negative for congestion and rhinorrhea.   Eyes: Negative for redness and visual disturbance.  Respiratory: Negative for shortness of breath and wheezing.   Cardiovascular: Negative for chest pain and palpitations.  Gastrointestinal: Negative for nausea and vomiting.  Genitourinary: Negative for dysuria and urgency.  Musculoskeletal: Negative for arthralgias and myalgias.  Skin: Negative for pallor and wound.  Neurological: Positive for syncope (near) and light-headedness. Negative for dizziness and headaches.     Physical Exam Updated Vital Signs BP 135/67 (BP Location:  Right Arm)   Pulse 73   Temp 98.4 F (36.9 C) (Oral)   Resp 18   Ht 5\' 5"  (1.651 m)   Wt (!) 145.2 kg (320 lb)   SpO2 100%   BMI 53.25 kg/m   Physical Exam  Constitutional: She is oriented to person, place, and time. She appears well-developed and well-nourished. No distress.  HENT:  Head: Normocephalic and atraumatic.  Eyes: EOM are normal. Pupils are equal,  round, and reactive to light.  Neck: Normal range of motion. Neck supple.  Cardiovascular: Normal rate and regular rhythm.  Exam reveals no gallop and no friction rub.   No murmur heard. Pulmonary/Chest: Effort normal. She has no wheezes. She has no rales.  Abdominal: Soft. She exhibits no distension and no mass. There is no tenderness. There is no guarding.  Musculoskeletal: She exhibits no edema or tenderness.  Neurological: She is alert and oriented to person, place, and time.  Skin: Skin is warm and dry. She is not diaphoretic.  Psychiatric: She has a normal mood and affect. Her behavior is normal.  Nursing note and vitals reviewed.    ED Treatments / Results  Labs (all labs ordered are listed, but only abnormal results are displayed) Labs Reviewed - No data to display  EKG  EKG Interpretation  Date/Time:  Saturday August 23 2016 11:58:13 EDT Ventricular Rate:  71 PR Interval:    QRS Duration: 85 QT Interval:  390 QTC Calculation: 424 R Axis:   55 Text Interpretation:  Sinus rhythm Borderline short PR interval Low voltage, precordial leads Borderline repolarization abnormality No significant change since last tracing Confirmed by Deno Etienne [phone]) on 08/23/2016 12:07:00 PM       Radiology No results found.  Procedures Procedures (including critical care time)  Medications Ordered in ED Medications - No data to display   Initial Impression / Assessment and Plan / ED Course  I have reviewed the triage vital signs and the nursing notes.  Pertinent labs & imaging results that were available during my care of the patient were reviewed by me and considered in my medical decision making (see chart for details).     36 yo F With a chief complaint of near-syncope. Patient had 2 events like this over the past week. She thinks is related to dehydration. Improved both times of fluids.  1:07 PM:  I have discussed the diagnosis/risks/treatment options with the patient and  family and believe the pt to be eligible for discharge home to follow-up with PCP. We also discussed returning to the ED immediately if new or worsening sx occur. We discussed the sx which are most concerning (e.g., sudden worsening pain, fever, inability to tolerate by mouth) that necessitate immediate return. Medications administered to the patient during their visit and any new prescriptions provided to the patient are listed below.  Medications given during this visit Medications - No data to display   The patient appears reasonably screen and/or stabilized for discharge and I doubt any other medical condition or other [facility] requiring further screening, evaluation, or treatment in the ED at this time prior to discharge.    Final Clinical Impressions(s) / ED Diagnoses   Final diagnoses:  Dehydration  Near syncope    New Prescriptions New Prescriptions   No medications on file     Deno Etienne, DO 08/23/16 1307

## 2016-09-12 DIAGNOSIS — Z6841 Body Mass Index (BMI) 40.0 and over, adult: Secondary | ICD-10-CM | POA: Diagnosis not present

## 2016-09-12 DIAGNOSIS — J4520 Mild intermittent asthma, uncomplicated: Secondary | ICD-10-CM | POA: Diagnosis not present

## 2016-09-12 DIAGNOSIS — E282 Polycystic ovarian syndrome: Secondary | ICD-10-CM | POA: Diagnosis not present

## 2016-09-12 DIAGNOSIS — Z9884 Bariatric surgery status: Secondary | ICD-10-CM | POA: Diagnosis not present

## 2016-09-12 DIAGNOSIS — I10 Essential (primary) hypertension: Secondary | ICD-10-CM | POA: Diagnosis not present

## 2016-09-12 DIAGNOSIS — Z23 Encounter for immunization: Secondary | ICD-10-CM | POA: Diagnosis not present

## 2016-09-12 DIAGNOSIS — Z0001 Encounter for general adult medical examination with abnormal findings: Secondary | ICD-10-CM | POA: Diagnosis not present

## 2016-09-12 DIAGNOSIS — I5030 Unspecified diastolic (congestive) heart failure: Secondary | ICD-10-CM | POA: Diagnosis not present

## 2016-09-19 ENCOUNTER — Other Ambulatory Visit: Payer: Self-pay | Admitting: Nurse Practitioner

## 2016-09-19 DIAGNOSIS — I5030 Unspecified diastolic (congestive) heart failure: Secondary | ICD-10-CM

## 2016-09-24 ENCOUNTER — Telehealth: Payer: Self-pay

## 2016-09-24 ENCOUNTER — Other Ambulatory Visit (HOSPITAL_COMMUNITY): Payer: BLUE CROSS/BLUE SHIELD

## 2016-09-24 NOTE — Telephone Encounter (Signed)
Received    Telephone call  from patient stating she has to cancel appointment  Today for echo because her insurance is not in effect at this time Patient stated that once she gets everything figured out concerning her insurance she will call to reschedule.

## 2016-10-04 ENCOUNTER — Emergency Department (HOSPITAL_BASED_OUTPATIENT_CLINIC_OR_DEPARTMENT_OTHER)
Admission: EM | Admit: 2016-10-04 | Discharge: 2016-10-04 | Disposition: A | Discharge status: Home / Self Care | Payer: Self-pay | Attending: Emergency Medicine | Admitting: Emergency Medicine

## 2016-10-04 ENCOUNTER — Encounter (HOSPITAL_BASED_OUTPATIENT_CLINIC_OR_DEPARTMENT_OTHER): Payer: Self-pay | Admitting: Emergency Medicine

## 2016-10-04 DIAGNOSIS — J45909 Unspecified asthma, uncomplicated: Secondary | ICD-10-CM | POA: Insufficient documentation

## 2016-10-04 DIAGNOSIS — I110 Hypertensive heart disease with heart failure: Secondary | ICD-10-CM | POA: Insufficient documentation

## 2016-10-04 DIAGNOSIS — I509 Heart failure, unspecified: Secondary | ICD-10-CM | POA: Insufficient documentation

## 2016-10-04 DIAGNOSIS — L02412 Cutaneous abscess of left axilla: Secondary | ICD-10-CM | POA: Insufficient documentation

## 2016-10-04 MED ORDER — LIDOCAINE-EPINEPHRINE (PF) 2 %-1:200000 IJ SOLN
10.0000 mL | Freq: Once | INTRAMUSCULAR | Status: AC
  Administered 2016-10-04: 10 mL via INTRADERMAL
  Filled 2016-10-04: qty 10

## 2016-10-04 MED ORDER — IBUPROFEN 800 MG PO TABS: 800.0000 mg | ORAL_TABLET | Freq: Three times a day (TID) | ORAL | 0 refills | Status: DC | PRN

## 2016-10-04 NOTE — ED Provider Notes (Signed)
[facility] DEPT MHP Provider Note   CSN: [id number] Arrival date & time: 10/04/16  1247     History   Chief Complaint Chief Complaint  Patient presents with  . Abscess    HPI Melissa Erickson is a 36 y.o. female.  HPI   36 year old obese female presenting for evaluation of an abscess to her left armpit. For the past 3-4 days she has noticed increasing pain and swelling to her left armpit. Symptoms felt similar to prior abscess that required incision and drainage in the past. Patient states she does develop these very often. She is up-to-date with tetanus. Her pain is currently sharp, mild to moderate in severity and nonradiating. No associated fever or rash.  Past Medical History:  Diagnosis Date  . Asthma   . Migraine   . Polycystic ovarian syndrome     Patient Active Problem List   Diagnosis Date Noted  . Physical deconditioning 07/28/2013  . Acute respiratory failure ([place]) 07/20/2013  . NSTEMI (non-ST elevated myocardial infarction), type 2 secondary to demand ischemia sue to sepsis 07/20/2013  . Morbid obesity ([place]) 07/18/2013  . ARDS (adult respiratory distress syndrome) ([place]) 07/18/2013  . Hypertension 07/18/2013  . CHF (congestive heart failure) ([place]) 07/18/2013    Past Surgical History:  Procedure Laterality Date  . CESAREAN SECTION    . CHOLECYSTECTOMY    . ESOPHAGOGASTRODUODENOSCOPY N/A 07/19/2013   Procedure: ESOPHAGOGASTRODUODENOSCOPY (EGD);  Surgeon: Harl Nicholos Aloisi, MD;  Location: [facility];  Service: General;  Laterality: N/A;  . LAPAROSCOPIC GASTRIC SLEEVE RESECTION    . LAPAROSCOPY N/A 07/19/2013   Procedure: LAPAROSCOPY DIAGNOSTIC;  Surgeon: Harl Kamarri Lovvorn, MD;  Location: [facility];  Service: General;  Laterality: N/A;    OB History    No data available       Home Medications    Prior to Admission medications   Medication Sig Start Date End Date Taking? Authorizing Provider  ibuprofen (ADVIL,MOTRIN) 800 MG tablet Take 1 tablet (800 mg  total) by mouth every 8 (eight) hours as needed for fever or moderate pain. 03/11/16   Ward, Ozella Almond, PA-C    Family History History reviewed. No pertinent family history.  Social History Social History  Substance Use Topics  . Smoking status: Never Smoker  . Smokeless tobacco: Never Used  . Alcohol use No     Allergies   Patient has no known allergies.   Review of Systems Review of Systems  Constitutional: Negative for fever.  Respiratory: Negative for chest tightness.   Cardiovascular: Negative for chest pain.  Skin: Negative for rash.  Neurological: Negative for numbness.     Physical Exam Updated Vital Signs BP 111/83 (BP Location: Left Arm)   Pulse 81   Temp 98.9 F (37.2 C) (Oral)   Resp 20   Ht 5\' 5"  (1.651 m)   Wt (!) 145.2 kg (320 lb)   SpO2 100%   BMI 53.25 kg/m   Physical Exam  Constitutional: She appears well-developed and well-nourished. No distress.  HENT:  Head: Atraumatic.  Eyes: Conjunctivae are normal.  Neck: Neck supple.  Neurological: She is alert.  Skin: No rash noted.  Left axillary region: An area of induration without significant fluctuant approximately 2 cm in diameter, tender to palpation which is noted medial to the axillary fold. No surround skin erythema.  Psychiatric: She has a normal mood and affect.  Nursing note and vitals reviewed.    ED Treatments / Results  Labs (all labs ordered are listed,  but only abnormal results are displayed) Labs Reviewed - No data to display  EKG  EKG Interpretation None       Radiology No results found.  Procedures .Marland KitchenIncision and Drainage Date/Time: 10/04/2016 2:18 PM Performed by: Domenic Moras Authorized by: Domenic Moras   Consent:    Consent obtained:  Verbal   Consent given by:  Patient   Risks discussed:  Incomplete drainage   Alternatives discussed:  Alternative treatment Location:    Type:  Abscess   Location:  Trunk (L axillary fold) Pre-procedure details:     Skin preparation:  Betadine Anesthesia (see MAR for exact dosages):    Anesthesia method:  Local infiltration   Local anesthetic:  Lidocaine 2% WITH epi Procedure type:    Complexity:  Simple Procedure details:    Needle aspiration: no     Incision types:  Stab incision   Incision depth:  Subcutaneous   Scalpel blade:  11   Wound management:  Probed and deloculated   Drainage:  Purulent   Drainage amount:  Scant   Wound treatment:  Wound left open   Packing materials:  None Post-procedure details:    Patient tolerance of procedure:  Tolerated well, no immediate complications   (including critical care time)    Medications Ordered in ED Medications - No data to display   Initial Impression / Assessment and Plan / ED Course  I have reviewed the triage vital signs and the nursing notes.  Pertinent labs & imaging results that were available during my care of the patient were reviewed by me and considered in my medical decision making (see chart for details).     BP 111/83 (BP Location: Left Arm)   Pulse 81   Temp 98.9 F (37.2 C) (Oral)   Resp 20   Ht 5\' 5"  (1.651 m)   Wt (!) 145.2 kg (320 lb)   SpO2 100%   BMI 53.25 kg/m    Final Clinical Impressions(s) / ED Diagnoses   Final diagnoses:  Abscess of left axilla    New Prescriptions Current Discharge Medication List     2:02 PM Patient with history of recurrent abscess to her axillary region here with another developing abscess. Plan to  perform incision and drainage. She is up-to-date with tetanus.   2:20 PM successsful I&D, pt tolerates well.  No packing placed, abx not indicated at this time.  Abscess care instruction given.  Return precaution discussed.    Domenic Moras, PA-C 10/04/16 Emelle, Julie, MD 10/04/16 [phone]

## 2016-10-04 NOTE — Discharge Instructions (Signed)
Continue to apply warm compress to affected area to promote healing

## 2016-10-04 NOTE — ED Notes (Signed)
ED Provider at bedside. 

## 2016-10-04 NOTE — ED Triage Notes (Signed)
Patient states that she has a boil to her left axilla region

## 2017-01-28 ENCOUNTER — Encounter (HOSPITAL_BASED_OUTPATIENT_CLINIC_OR_DEPARTMENT_OTHER): Payer: Self-pay | Admitting: *Deleted

## 2017-01-28 ENCOUNTER — Other Ambulatory Visit: Payer: Self-pay

## 2017-01-28 ENCOUNTER — Emergency Department (HOSPITAL_BASED_OUTPATIENT_CLINIC_OR_DEPARTMENT_OTHER)
Admission: EM | Admit: 2017-01-28 | Discharge: 2017-01-28 | Disposition: A | Discharge status: Home / Self Care | Payer: Self-pay | Attending: Emergency Medicine | Admitting: Emergency Medicine

## 2017-01-28 ENCOUNTER — Emergency Department (HOSPITAL_BASED_OUTPATIENT_CLINIC_OR_DEPARTMENT_OTHER): Payer: Self-pay

## 2017-01-28 DIAGNOSIS — I509 Heart failure, unspecified: Secondary | ICD-10-CM | POA: Insufficient documentation

## 2017-01-28 DIAGNOSIS — J189 Pneumonia, unspecified organism: Secondary | ICD-10-CM

## 2017-01-28 DIAGNOSIS — I110 Hypertensive heart disease with heart failure: Secondary | ICD-10-CM | POA: Insufficient documentation

## 2017-01-28 DIAGNOSIS — J181 Lobar pneumonia, unspecified organism: Secondary | ICD-10-CM | POA: Insufficient documentation

## 2017-01-28 DIAGNOSIS — J45909 Unspecified asthma, uncomplicated: Secondary | ICD-10-CM | POA: Insufficient documentation

## 2017-01-28 LAB — RAPID STREP SCREEN (MED CTR MEBANE ONLY): Streptococcus, Group A Screen (Direct): NEGATIVE

## 2017-01-28 MED ORDER — DOXYCYCLINE HYCLATE 100 MG PO CAPS
100.0000 mg | ORAL_CAPSULE | Freq: Two times a day (BID) | ORAL | 0 refills | Status: AC
Start: 2017-01-28 — End: 2017-02-04

## 2017-01-28 MED ORDER — IBUPROFEN 800 MG PO TABS
800.0000 mg | ORAL_TABLET | Freq: Once | ORAL | Status: AC
  Administered 2017-01-28: 800 mg via ORAL

## 2017-01-28 MED ORDER — IBUPROFEN 800 MG PO TABS
ORAL_TABLET | ORAL | Status: AC
  Filled 2017-01-28: qty 1

## 2017-01-28 NOTE — ED Triage Notes (Signed)
Pt c/o SOb, cough congestion  x 3 days HX asthma

## 2017-01-28 NOTE — ED Provider Notes (Signed)
[facility] Provider Note   CSN: [id number] Arrival date & time: 01/28/17  1322     History   Chief Complaint Chief Complaint  Patient presents with  . Shortness of Breath    HPI Melissa Erickson is a 36 y.o. female.  HPI  36 y.o. female with a hx of Asthma, presents to the Emergency Department today due to cough x 3 days. Notes productive cough. No CP/ABD pain. Notes mild shortness of breath with use of inhaler with relief. No fevers. No N/V/D. Notes mild sinus congestion with rhinorrhea. No sick contacts. Denies pain currently. No meds PTA. No other symptoms noted     Past Medical History:  Diagnosis Date  . Asthma   . Migraine   . Polycystic ovarian syndrome     Patient Active Problem List   Diagnosis Date Noted  . Physical deconditioning 07/28/2013  . Acute respiratory failure ([place]) 07/20/2013  . NSTEMI (non-ST elevated myocardial infarction), type 2 secondary to demand ischemia sue to sepsis 07/20/2013  . Morbid obesity ([place]) 07/18/2013  . ARDS (adult respiratory distress syndrome) ([place]) 07/18/2013  . Hypertension 07/18/2013  . CHF (congestive heart failure) ([place]) 07/18/2013    Past Surgical History:  Procedure Laterality Date  . CESAREAN SECTION    . CHOLECYSTECTOMY    . ESOPHAGOGASTRODUODENOSCOPY N/A 07/19/2013   Procedure: ESOPHAGOGASTRODUODENOSCOPY (EGD);  Surgeon: Harl Bowie, MD;  Location: [facility];  Service: General;  Laterality: N/A;  . LAPAROSCOPIC GASTRIC SLEEVE RESECTION    . LAPAROSCOPY N/A 07/19/2013   Procedure: LAPAROSCOPY DIAGNOSTIC;  Surgeon: Harl Bowie, MD;  Location: [facility];  Service: General;  Laterality: N/A;    OB History    No data available       Home Medications    Prior to Admission medications   Medication Sig Start Date End Date Taking? Authorizing Provider  ibuprofen (ADVIL,MOTRIN) 800 MG tablet Take 1 tablet (800 mg total) by mouth every 8 (eight) hours as needed for fever or  moderate pain. 10/04/16   Domenic Moras, PA-C    Family History History reviewed. No pertinent family history.  Social History Social History   Tobacco Use  . Smoking status: Never Smoker  . Smokeless tobacco: Never Used  Substance Use Topics  . Alcohol use: No  . Drug use: No     Allergies   Patient has no known allergies.   Review of Systems Review of Systems ROS reviewed and all are negative for acute change except as noted in the HPI.  Physical Exam Updated Vital Signs BP 123/65 (BP Location: Right Arm)   Pulse 81   Temp 99.4 F (37.4 C) (Oral)   Resp 18   Ht 5\' 5"  (1.651 m)   Wt (!) 145.2 kg (320 lb)   LMP 01/21/2017   SpO2 98%   BMI 53.25 kg/m   Physical Exam  Constitutional: She is oriented to person, place, and time. She appears well-developed and well-nourished. No distress.  HENT:  Head: Normocephalic and atraumatic.  Right Ear: Tympanic membrane, external ear and ear canal normal.  Left Ear: Tympanic membrane, external ear and ear canal normal.  Nose: Nose normal.  Mouth/Throat: Uvula is midline, oropharynx is clear and moist and mucous membranes are normal. No trismus in the jaw. No oropharyngeal exudate, posterior oropharyngeal erythema or tonsillar abscesses.  Eyes: EOM are normal. Pupils are equal, round, and reactive to light.  Neck: Normal range of motion. Neck supple. No tracheal deviation present.  Cardiovascular: Normal rate, regular rhythm, S1 normal, S2 normal, normal heart sounds, intact distal pulses and normal pulses.  Pulmonary/Chest: Effort normal and breath sounds normal. No respiratory distress. She has no decreased breath sounds. She has no wheezes. She has no rhonchi. She has no rales.  Abdominal: Normal appearance and bowel sounds are normal. There is no tenderness.  Musculoskeletal: Normal range of motion.  Neurological: She is alert and oriented to person, place, and time.  Skin: Skin is warm and dry.  Psychiatric: She has a  normal mood and affect. Her speech is normal and behavior is normal. Thought content normal.  Nursing note and vitals reviewed.    ED Treatments / Results  Labs (all labs ordered are listed, but only abnormal results are displayed) Labs Reviewed  RAPID STREP SCREEN (NOT AT [facility] )    EKG  EKG Interpretation None       Radiology Dg Chest 2 View  Result Date: 01/28/2017 CLINICAL DATA:  Cough, shortness of breath. EXAM: CHEST  2 VIEW COMPARISON:  Radiograph of July 24, 2013. FINDINGS: The heart size and mediastinal contours are within normal limits. No pneumothorax or pleural effusion is noted. Left lung is clear. Right middle lobe opacity is noted concerning for pneumonia. The visualized skeletal structures are unremarkable. IMPRESSION: Probable right middle lobe pneumonia. Followup PA and lateral chest X-ray is recommended in 3-4 weeks following trial of antibiotic therapy to ensure resolution and exclude underlying malignancy. Electronically Signed   By: Marijo Conception, M.D.   On: 01/28/2017 14:00    Procedures Procedures (including critical care time)  Medications Ordered in ED Medications  ibuprofen (ADVIL,MOTRIN) tablet 800 mg (800 mg Oral Given 01/28/17 1334)     Initial Impression / Assessment and Plan / ED Course  I have reviewed the triage vital signs and the nursing notes.  Pertinent labs & imaging results that were available during my care of the patient were reviewed by me and considered in my medical decision making (see chart for details).  Final Clinical Impressions(s) / ED Diagnoses  {I have reviewed and evaluated the relevant laboratory values. {I have reviewed and evaluated the relevant imaging studies.  {I have reviewed the relevant previous healthcare records.  {I obtained HPI from historian.   ED Course:  Assessment: Pt is a 36 y.o. female  with a hx of Asthma, presents to the Emergency Department today due to cough x 3 days. Notes productive cough. No  CP/ABD pain. Notes mild shortness of breath with use of inhaler with relief. No fevers. No N/V/D. Notes mild sinus congestion with rhinorrhea. No sick contacts. Denies pain currently. No meds PTA. On exam, pt in NAD. Nontoxic/nonseptic appearing. VSS. Afebrile. Lungs CTA. Heart RRR. Abdomen nontender soft. CXR with right middle lobe PNA. Given Rx ABX. Plan is to [place] with follow up to PCP. At time of discharge, Patient is in no acute distress. Vital Signs are stable. Patient is able to ambulate. Patient able to tolerate PO.   Disposition/Plan:  DC Home Additional Verbal discharge instructions given and discussed with patient.  Pt Instructed to f/u with PCP in the next week for evaluation and treatment of symptoms. Return precautions given Pt acknowledges and agrees with plan  Supervising Physician Charlesetta Shanks, MD  Final diagnoses:  Community acquired pneumonia of right middle lobe of lung [facility])    ED Discharge Orders    None       Shary Decamp, PA-C 01/28/17 1611    Charlesetta Shanks,  MD 01/30/17 [id number]

## 2017-01-28 NOTE — ED Notes (Signed)
RT in triage

## 2017-01-28 NOTE — Discharge Instructions (Addendum)
Please read and follow all provided instructions.  Your diagnoses today include:  1. Community acquired pneumonia of right middle lobe of lung ([place])    Tests performed today include: Blood counts and electrolytes Chest x-ray -- shows pneumonia Vital signs. See below for your results today.   Medications prescribed:   Take any prescribed medications only as directed.  Home care instructions:  Follow any educational materials contained in this packet.  Take the complete course of antibiotics that you were prescribed.   BE VERY CAREFUL not to take multiple medicines containing Tylenol (also called acetaminophen). Doing so can lead to an overdose which can damage your liver and cause liver failure and possibly death.   Follow-up instructions: Please follow-up with your primary care provider in the next 3 days for further evaluation of your symptoms and to ensure resolution of your infection.   Return instructions:  Please return to the Emergency Department if you experience worsening symptoms.  Return immediately with worsening breathing, worsening shortness of breath, or if you feel it is taking you more effort to breathe.  Please return if you have any other emergent concerns.  Additional Information:  Your vital signs today were: BP 123/65 (BP Location: Right Arm)    Pulse 81    Temp 99.4 F (37.4 C) (Oral)    Resp 18    Ht 5\' 5"  (1.651 m)    Wt (!) 145.2 kg (320 lb)    LMP 01/21/2017    SpO2 98%    BMI 53.25 kg/m  If your blood pressure (BP) was elevated above 135/85 this visit, please have this repeated by your doctor within one month. --------------

## 2017-01-31 LAB — CULTURE, GROUP A STREP (THRC)

## 2017-02-13 ENCOUNTER — Other Ambulatory Visit: Payer: Self-pay

## 2017-02-13 ENCOUNTER — Emergency Department (HOSPITAL_BASED_OUTPATIENT_CLINIC_OR_DEPARTMENT_OTHER)
Admission: EM | Admit: 2017-02-13 | Discharge: 2017-02-13 | Disposition: A | Discharge status: Home / Self Care | Payer: Self-pay | Attending: Emergency Medicine | Admitting: Emergency Medicine

## 2017-02-13 ENCOUNTER — Encounter (HOSPITAL_BASED_OUTPATIENT_CLINIC_OR_DEPARTMENT_OTHER): Payer: Self-pay | Admitting: *Deleted

## 2017-02-13 ENCOUNTER — Emergency Department (HOSPITAL_BASED_OUTPATIENT_CLINIC_OR_DEPARTMENT_OTHER): Payer: Self-pay

## 2017-02-13 DIAGNOSIS — I252 Old myocardial infarction: Secondary | ICD-10-CM | POA: Insufficient documentation

## 2017-02-13 DIAGNOSIS — R05 Cough: Secondary | ICD-10-CM | POA: Insufficient documentation

## 2017-02-13 DIAGNOSIS — J45909 Unspecified asthma, uncomplicated: Secondary | ICD-10-CM | POA: Insufficient documentation

## 2017-02-13 DIAGNOSIS — R059 Cough, unspecified: Secondary | ICD-10-CM

## 2017-02-13 DIAGNOSIS — I110 Hypertensive heart disease with heart failure: Secondary | ICD-10-CM | POA: Insufficient documentation

## 2017-02-13 DIAGNOSIS — J029 Acute pharyngitis, unspecified: Secondary | ICD-10-CM | POA: Insufficient documentation

## 2017-02-13 DIAGNOSIS — I509 Heart failure, unspecified: Secondary | ICD-10-CM | POA: Insufficient documentation

## 2017-02-13 LAB — RAPID STREP SCREEN (MED CTR MEBANE ONLY): STREPTOCOCCUS, GROUP A SCREEN (DIRECT): NEGATIVE

## 2017-02-13 MED ORDER — BENZONATATE 100 MG PO CAPS: 100.0000 mg | ORAL_CAPSULE | Freq: Three times a day (TID) | ORAL | 0 refills | Status: DC | PRN

## 2017-02-13 NOTE — ED Notes (Signed)
Patient transported to X-ray 

## 2017-02-13 NOTE — ED Triage Notes (Signed)
Sore throat and cough. Hx pneumonia 12/12.

## 2017-02-13 NOTE — Discharge Instructions (Signed)
Please read instructions below.  You can take tylenol as needed for sore throat or body aches. Drink plenty of water.  You can take tessalon every 8 hours as needed for cough. Use your albuterol inhaler every 6 hours as needed. Use a humidifier to help with your cough and throat irritation. Follow up with your primary care provider if symptoms persist.  Return to the ER for difficulty swallowing liquids, difficulty breathing, or new or worsening symptoms.

## 2017-02-13 NOTE — ED Provider Notes (Signed)
[facility] EMERGENCY DEPARTMENT Provider Note   CSN: [id number] Arrival date & time: 02/13/17  1704     History   Chief Complaint Chief Complaint  Patient presents with  . Sore Throat  . Cough    HPI Melissa Erickson is a 36 y.o. female w PMHx asthma, HTN, CHF, NSTEMI, presenting to ED with persistent dry cough and sore throat since last ED visit on 01/28/17. Pt was diagnosed with pneumonia, and treated with Doxycycline. Pt reports she took her antibiotics as prescribed until gone. States she had a few days of feeling better, but began having an annoying dry cough. States cough is no longer productive. Has not been using her inhaler for symptoms. Denies congestion, SOB, CP, fever, myalgias, or any other complaints.  The history is provided by the patient.    Past Medical History:  Diagnosis Date  . Asthma   . Migraine   . Polycystic ovarian syndrome     Patient Active Problem List   Diagnosis Date Noted  . Physical deconditioning 07/28/2013  . Acute respiratory failure ([place]) 07/20/2013  . NSTEMI (non-ST elevated myocardial infarction), type 2 secondary to demand ischemia sue to sepsis 07/20/2013  . Morbid obesity ([place]) 07/18/2013  . ARDS (adult respiratory distress syndrome) ([place]) 07/18/2013  . Hypertension 07/18/2013  . CHF (congestive heart failure) ([place]) 07/18/2013    Past Surgical History:  Procedure Laterality Date  . CESAREAN SECTION    . CHOLECYSTECTOMY    . ESOPHAGOGASTRODUODENOSCOPY N/A 07/19/2013   Procedure: ESOPHAGOGASTRODUODENOSCOPY (EGD);  Surgeon: Harl Bowie, MD;  Location: [facility];  Service: General;  Laterality: N/A;  . LAPAROSCOPIC GASTRIC SLEEVE RESECTION    . LAPAROSCOPY N/A 07/19/2013   Procedure: LAPAROSCOPY DIAGNOSTIC;  Surgeon: Harl Bowie, MD;  Location: [facility];  Service: General;  Laterality: N/A;    OB History    No data available       Home Medications    Prior to Admission medications   Medication Sig Start  Date End Date Taking? Authorizing Provider  benzonatate (TESSALON) 100 MG capsule Take 1 capsule (100 mg total) by mouth 3 (three) times daily as needed for cough. 02/13/17   Keishaun Hazel, [place] N, PA-C  ibuprofen (ADVIL,MOTRIN) 800 MG tablet Take 1 tablet (800 mg total) by mouth every 8 (eight) hours as needed for fever or moderate pain. 10/04/16   Domenic Moras, PA-C    Family History No family history on file.  Social History Social History   Tobacco Use  . Smoking status: Never Smoker  . Smokeless tobacco: Never Used  Substance Use Topics  . Alcohol use: No  . Drug use: No     Allergies   Patient has no known allergies.   Review of Systems Review of Systems  Constitutional: Negative for fever.  HENT: Positive for sore throat. Negative for congestion, trouble swallowing and voice change.   Respiratory: Positive for cough. Negative for shortness of breath.   Cardiovascular: Negative for chest pain and leg swelling.  All other systems reviewed and are negative.    Physical Exam Updated Vital Signs BP 117/67 (BP Location: Left Arm)   Pulse 78   Temp 98.8 F (37.1 C) (Oral)   Resp 18   Ht 5\' 5"  (1.651 m)   Wt (!) 145.2 kg (320 lb)   LMP 01/04/2017 (Within Weeks) Comment: irreg periods  SpO2 100%   BMI 53.25 kg/m   Physical Exam  Constitutional: She appears well-developed and well-nourished.  Non-toxic appearance.  She does not appear ill. No distress.  HENT:  Head: Normocephalic and atraumatic.  Mouth/Throat: Uvula is midline. No trismus in the jaw. No uvula swelling. Posterior oropharyngeal erythema (mild) present. No posterior oropharyngeal edema.  Eyes: Conjunctivae are normal.  Neck: Normal range of motion. Neck supple.  Cardiovascular: Normal rate, regular rhythm, normal heart sounds and intact distal pulses.  Pulmonary/Chest: Effort normal and breath sounds normal. No stridor. No respiratory distress. She has no wheezes. She has no rales.  Abdominal: Soft.    Lymphadenopathy:    She has no cervical adenopathy.  Neurological: She is alert.  Skin: Skin is warm.  Psychiatric: She has a normal mood and affect. Her behavior is normal.  Nursing note and vitals reviewed.    ED Treatments / Results  Labs (all labs ordered are listed, but only abnormal results are displayed) Labs Reviewed  RAPID STREP SCREEN (NOT AT [facility])  CULTURE, GROUP A STREP [facility])    EKG  EKG Interpretation None       Radiology Dg Chest 2 View  Result Date: 02/13/2017 CLINICAL DATA:  Cough and pneumonia EXAM: CHEST  2 VIEW COMPARISON:  01/28/2017 FINDINGS: Improving infiltrate in the right lower lobe and right middle lobe. Negative for effusion. Left lung remains clear. Negative for heart failure. IMPRESSION: Interval improvement in right lower lobe and right middle lobe infiltrate. Electronically Signed   By: Franchot Gallo M.D.   On: 02/13/2017 20:28    Procedures Procedures (including critical care time)  Medications Ordered in ED Medications - No data to display   Initial Impression / Assessment and Plan / ED Course  I have reviewed the triage vital signs and the nursing notes.  Pertinent labs & imaging results that were available during my care of the patient were reviewed by me and considered in my medical decision making (see chart for details).     Pt presenting with persistent dry cough and mild sore throat since PNA diagnosis on 01/28/17. Pt  Afebrile, nontoxic, tolerating secretions. Lungs CTAB. O2 sat 100% on RA. CXR today w interval improvement. Strep neg. Recommend symptomatic management, including using her inhaler as needed, humidifier by her bed. Rx for tessalon. Also discussed importance of PCP follow up. Well-appearing, safe for discharge.  Discussed results, findings, treatment and follow up. Patient advised of return precautions. Patient verbalized understanding and agreed with plan.  Final Clinical Impressions(s) / ED Diagnoses    Final diagnoses:  Cough  Sore throat    ED Discharge Orders        Ordered    benzonatate (TESSALON) 100 MG capsule  3 times daily PRN     02/13/17 2124       Angelisse Riso, [place] N, PA-C 02/14/17 7425    Gareth Morgan, MD 02/14/17 1329

## 2017-02-16 LAB — CULTURE, GROUP A STREP (THRC)

## 2017-07-20 ENCOUNTER — Other Ambulatory Visit: Payer: Self-pay

## 2017-07-21 ENCOUNTER — Encounter: Payer: Self-pay | Admitting: Family Medicine

## 2017-07-21 ENCOUNTER — Other Ambulatory Visit: Payer: Self-pay

## 2017-07-21 ENCOUNTER — Ambulatory Visit: Payer: BLUE CROSS/BLUE SHIELD | Admitting: Family Medicine

## 2017-07-21 VITALS — BP 120/78 | HR 85 | Temp 98.0°F | Resp 16 | Ht 65.0 in | Wt 342.2 lb

## 2017-07-21 DIAGNOSIS — J4520 Mild intermittent asthma, uncomplicated: Secondary | ICD-10-CM

## 2017-07-21 DIAGNOSIS — E282 Polycystic ovarian syndrome: Secondary | ICD-10-CM | POA: Insufficient documentation

## 2017-07-21 DIAGNOSIS — L732 Hidradenitis suppurativa: Secondary | ICD-10-CM

## 2017-07-21 DIAGNOSIS — M160 Bilateral primary osteoarthritis of hip: Secondary | ICD-10-CM

## 2017-07-21 DIAGNOSIS — M47816 Spondylosis without myelopathy or radiculopathy, lumbar region: Secondary | ICD-10-CM

## 2017-07-21 DIAGNOSIS — Z9884 Bariatric surgery status: Secondary | ICD-10-CM | POA: Diagnosis not present

## 2017-07-21 DIAGNOSIS — I5032 Chronic diastolic (congestive) heart failure: Secondary | ICD-10-CM | POA: Diagnosis not present

## 2017-07-21 DIAGNOSIS — Z903 Acquired absence of stomach [part of]: Secondary | ICD-10-CM | POA: Insufficient documentation

## 2017-07-21 HISTORY — DX: Hidradenitis suppurativa: L73.2

## 2017-07-21 MED ORDER — DICLOFENAC SODIUM 75 MG PO TBEC: 75.0000 mg | DELAYED_RELEASE_TABLET | Freq: Two times a day (BID) | ORAL | 3 refills | Status: DC | PRN

## 2017-07-21 MED ORDER — VITAMIN D 50 MCG (2000 UT) PO TABS: 4000.0000 [IU] | ORAL_TABLET | Freq: Every day | ORAL | Status: DC

## 2017-07-21 NOTE — Progress Notes (Signed)
Subjective  CC:  Chief Complaint  Patient presents with  . Establish Care    last saw at [facility] in 2015; here to reestablish. last cpe 08/2016  . Back Pain    CT scan done recently showed DJD; limits her activities  . Obesity    starting to regain weight after gastric bypass surgery.     HPI: Melissa Erickson is a 37 y.o. female is a former [facility] patient and is here to reestablish care with me today.  I last saw her in 2015. I have reviewed notes from care everywhere.  She has the following concerns or needs:  Obesity: eats small portions but limited activity; says "hurts all the time". Has back pain and leg pain. Evaluated by GYN recently: CT scan reviewed with DJD in spine and hips. Takes tylenol with minimal relief. No radicular sxs.   H/o sepsis and high output demand MI and ARDs after surgery in 2015; now better but hasn't really started working on weight loss again.   Overall feel ok; rare albuterol use for asthma. Did have CaP over winter. Now recovered. Reviewed er notes.   Recurrent suppurative hidradenitis; stable  HM: up to date on pap.   PCOS: no longer on metformin; did have fertility eval last year; was unable to get pregnant. Now with regular menses, but heavy. Seeing new GYN soon.   Assessment  1. Spondylosis of lumbar region without myelopathy or radiculopathy   2. Primary osteoarthritis of both hips   3. Chronic diastolic congestive heart failure (HCC) Chronic  4. Morbid obesity (HCC) Chronic  5. Suppurative hidradenitis   6. Mild intermittent asthma without complication   7. Gastric bypass status for obesity      Plan   Treat pain: start with nsaids. Then start exercising.   H/o Vit D deficiency: recommend starting oral supplements again; this could be contributing to her pain. Will recheck levels at cpe  Asthma and CHF are stable.   Consider rechecking for osa again in future.   Discussed weight loss goals. Needs eval for vit deficiencies and anemia  at cpe visit   Follow up:  Return in about 1 month (around 08/18/2017) for complete physical.  No orders of the defined types were placed in this encounter.  Meds ordered this encounter  Medications  . diclofenac (VOLTAREN) 75 MG EC tablet    Sig: Take 1 tablet (75 mg total) by mouth 2 (two) times daily as needed for moderate pain.    Dispense:  60 tablet    Refill:  3  . Cholecalciferol (VITAMIN D) 2000 units tablet    Sig: Take 2 tablets (4,000 Units total) by mouth daily.      We updated and reviewed the patient's past history in detail and it is documented below.  Patient Active Problem List   Diagnosis Date Noted  . Gastric bypass status for obesity 07/21/2017    Priority: High  . History of obstructive sleep apnea 08/08/2013    Priority: High  . Morbid obesity with BMI of 50.0-59.9, adult ([place]) 06/14/2012    Priority: High  . PCOS (polycystic ovarian syndrome) 07/21/2017    Priority: Medium  . Suppurative hidradenitis 07/21/2017    Priority: Medium  . NSTEMI (non-ST elevated myocardial infarction), type 2 secondary to demand ischemia sue to sepsis 07/20/2013    Priority: Medium  . History of ARDS 07/18/2013    Priority: Medium    Overview:  ARDS requiring intubation after gastric sleeve bypass  06/2013   . Diastolic dysfunction with heart failure ([place]) 07/18/2013    Priority: Medium    Overview:  Echo EF 55%   . Mild intermittent asthma 03/11/2013    Priority: Medium  . Migraine 06/14/2012    Priority: Medium  . Spondylosis of lumbar region without myelopathy or radiculopathy 07/21/2017  . Primary osteoarthritis of both hips 07/21/2017   Health Maintenance  Topic Date Due  . HIV Screening  07/22/2018 (Originally 01/08/1996)  . INFLUENZA VACCINE  09/17/2017  . PAP SMEAR  02/17/2018  . TETANUS/TDAP  09/13/2026   Immunization History  Administered Date(s) Administered  . Pneumococcal Polysaccharide-23 04/01/2013  . Tdap 09/12/2016   Current Meds    Medication Sig  . albuterol (PROVENTIL HFA;VENTOLIN HFA) 108 (90 Base) MCG/ACT inhaler 2 puffs every 4-6 hours as needed for asthma/cough/wheeze    Allergies: Patient has No Known Allergies. Past Medical History Patient  has a past medical history of Allergy, Asthma, Chicken pox, Environmental allergies, Essential hypertension (07/18/2013), Gallstones, Heart attack ([place]), Migraine, Polycystic ovarian syndrome, Suppurative hidradenitis (07/21/2017), and Ventral hernia. Past Surgical History Patient  has a past surgical history that includes Cholecystectomy; Laparoscopic gastric sleeve resection; laparoscopy (N/A, 07/19/2013); Esophagogastroduodenoscopy (N/A, 07/19/2013); Cesarean section (2007); and Breast biopsy (Right, 2001). Family History: Patient family history includes Alcohol abuse in her father; Allergic rhinitis in her daughter; Alzheimer's disease in her maternal grandmother; COPD in her maternal grandfather; Cancer in her maternal grandfather, paternal grandfather, and paternal grandmother; Cerebral aneurysm in her paternal grandmother; Diabetes in her maternal grandfather and mother; Early death in her father and paternal grandmother; Healthy in her brother; Heart attack in her maternal grandmother; Miscarriages / [place] in her mother; Sarcoidosis in her father; Stroke in her mother. Social History:  Patient  reports that she has never smoked. She has never used smokeless tobacco. She reports that she has current or past drug history. Drug: Hydromorphone. She reports that she does not drink alcohol.  Review of Systems: Constitutional: negative for fever or malaise Ophthalmic: negative for photophobia, double vision or loss of vision Cardiovascular: negative for chest pain, dyspnea on exertion, or new LE swelling Respiratory: negative for SOB or persistent cough Gastrointestinal: negative for abdominal pain, change in bowel habits or melena Genitourinary: negative for dysuria or gross  hematuria Musculoskeletal: negative for new gait disturbance or muscular weakness Integumentary: negative for new or persistent rashes Neurological: negative for TIA or stroke symptoms Psychiatric: negative for SI or delusions Allergic/Immunologic: negative for hives  Patient Care Team    Relationship Specialty Notifications Start End  Leamon Arnt, MD PCP - General Family Medicine  07/21/17   Sanjuana Kava, MD Referring Physician Obstetrics and Gynecology  07/21/17    Wt Readings from Last 3 Encounters:  07/21/17 (!) 342 lb 3.2 oz (155.2 kg)  02/13/17 (!) 320 lb (145.2 kg)  01/28/17 (!) 320 lb (145.2 kg)     Objective  Vitals: BP 120/78   Pulse 85   Temp 98 F (36.7 C) (Oral)   Resp 16   Ht 5\' 5"  (1.651 m)   Wt (!) 342 lb 3.2 oz (155.2 kg)   SpO2 98%   BMI 56.95 kg/m  General:  Well developed, well nourished, no acute distress  Psych:  Alert and oriented,normal mood and affect HEENT:  Normocephalic, atraumatic, non-icteric sclera, PERRL, oropharynx is without mass or exudate, supple neck without adenopathy, mass or thyromegaly Cardiovascular:  RRR without gallop, rub or murmur, nondisplaced PMI Respiratory:  Good breath sounds  bilaterally, CTAB with normal respiratory effort Gastrointestinal: normal bowel sounds, soft, non-tender, no noted masses. No HSM MSK: no deformities, contusions. Joints are without erythema or swelling Skin:  Warm, no rashes or suspicious lesions noted Neurologic:    Mental status is normal. Gross motor and sensory exams are normal. Normal gait Depression screen PHQ 2/9 07/21/2017  Decreased Interest 0  Down, Depressed, Hopeless 0  PHQ - 2 Score 0     Commons side effects, risks, benefits, and alternatives for medications and treatment plan prescribed today were discussed, and the patient expressed understanding of the given instructions. Patient is instructed to call or message via MyChart if he/she has any questions or concerns regarding our  treatment plan. No barriers to understanding were identified. We discussed Red Flag symptoms and signs in detail. Patient expressed understanding regarding what to do in case of urgent or emergency type symptoms.   Medication list was reconciled, printed and provided to the patient in AVS. Patient instructions and summary information was reviewed with the patient as documented in the AVS. This note was prepared with assistance of Dragon voice recognition software. Occasional wrong-word or sound-a-like substitutions may have occurred due to the inherent limitations of voice recognition software

## 2017-07-21 NOTE — Patient Instructions (Addendum)
It was so good seeing you again! Thank you for establishing with my new practice and allowing me to continue caring for you. It means a lot to me.   Please schedule a follow up appointment with me in 1-3 months for your annual complete physical; please come fasting.  Start working on Editor, commissioning and weight loss.   Take the diclofenac as needed to help with your back pain, then you can move better.   Take Vit D daily.

## 2017-07-23 DIAGNOSIS — Z01419 Encounter for gynecological examination (general) (routine) without abnormal findings: Secondary | ICD-10-CM | POA: Diagnosis not present

## 2017-07-23 DIAGNOSIS — Z124 Encounter for screening for malignant neoplasm of cervix: Secondary | ICD-10-CM | POA: Diagnosis not present

## 2017-07-23 DIAGNOSIS — Z6841 Body Mass Index (BMI) 40.0 and over, adult: Secondary | ICD-10-CM | POA: Diagnosis not present

## 2017-07-23 DIAGNOSIS — Z113 Encounter for screening for infections with a predominantly sexual mode of transmission: Secondary | ICD-10-CM | POA: Diagnosis not present

## 2017-07-23 LAB — HM PAP SMEAR

## 2017-07-23 LAB — HM HEPATITIS C SCREENING LAB: HM HEPATITIS C SCREENING: NEGATIVE

## 2017-07-28 DIAGNOSIS — R899 Unspecified abnormal finding in specimens from other organs, systems and tissues: Secondary | ICD-10-CM | POA: Diagnosis not present

## 2017-08-03 ENCOUNTER — Encounter: Payer: Self-pay | Admitting: Family Medicine

## 2017-08-03 ENCOUNTER — Other Ambulatory Visit: Payer: Self-pay

## 2017-08-03 ENCOUNTER — Ambulatory Visit: Payer: BLUE CROSS/BLUE SHIELD | Admitting: Family Medicine

## 2017-08-03 VITALS — Ht 65.0 in | Wt 341.0 lb

## 2017-08-03 DIAGNOSIS — R768 Other specified abnormal immunological findings in serum: Secondary | ICD-10-CM | POA: Diagnosis not present

## 2017-08-03 NOTE — Progress Notes (Signed)
Subjective  CC:  Chief Complaint  Patient presents with  . Results    patient states she saw her GYN and they stated that she had Herpes, Patient states she has never had exposure and would like to discuss further testing    HPI: Melissa Erickson is a 37 y.o. female who presents to the office today to address the problems listed above in the chief complaint.  I reviewed recent office note from [facility].  Patient here for follow-up of abnormal STD screening.  She is currently asymptomatic.  GYN screen for herpes with an HSV antibody test.  I do not have these current records.  Patient was notified that she had a positive test and is very upset and anxious about this new diagnosis.  Never diagnosed with herpes before.  Denies any history of vulvar or labial ulcers, sores, blisters, lesions.  She does have history of cold sores.  She is in a current monogamous relationship of 3 years.  Has had approximately 10 lifetime partners. Assessment  1. HSV-2 seropositive      Plan   HSV-2 seropositivity: Discussed meaning of this test.  Possible to positive and history of infection but since she is asymptomatic no treatment warranted or indicated.  This could also be a cross-reactive seropositivity from HSV 1.  Counseling done.  Reassured.  Patient to come in if she ever does have a vulvar labial lesion.  No further testing or treatment warranted at this time.  I will try to get the results of her test.  Follow up: Return if symptoms worsen or fail to improve.   No orders of the defined types were placed in this encounter.  No orders of the defined types were placed in this encounter.     I reviewed the patients updated PMH, FH, and SocHx.    Patient Active Problem List   Diagnosis Date Noted  . Gastric bypass status for obesity 07/21/2017    Priority: High  . History of obstructive sleep apnea 08/08/2013    Priority: High  . Morbid obesity with BMI of 50.0-59.9, adult  ([place]) 06/14/2012    Priority: High  . PCOS (polycystic ovarian syndrome) 07/21/2017    Priority: Medium  . Suppurative hidradenitis 07/21/2017    Priority: Medium  . NSTEMI (non-ST elevated myocardial infarction), type 2 secondary to demand ischemia sue to sepsis 07/20/2013    Priority: Medium  . History of ARDS 07/18/2013    Priority: Medium  . Diastolic dysfunction with heart failure ([place]) 07/18/2013    Priority: Medium  . Mild intermittent asthma 03/11/2013    Priority: Medium  . Migraine 06/14/2012    Priority: Medium  . Spondylosis of lumbar region without myelopathy or radiculopathy 07/21/2017  . Primary osteoarthritis of both hips 07/21/2017   Current Meds  Medication Sig  . albuterol (PROVENTIL HFA;VENTOLIN HFA) 108 (90 Base) MCG/ACT inhaler 2 puffs every 4-6 hours as needed for asthma/cough/wheeze  . Cholecalciferol (VITAMIN D) 2000 units tablet Take 2 tablets (4,000 Units total) by mouth daily.  . diclofenac (VOLTAREN) 75 MG EC tablet Take 1 tablet (75 mg total) by mouth 2 (two) times daily as needed for moderate pain.    Allergies: Patient has No Known Allergies. Family History: Patient family history includes Alcohol abuse in her father; Allergic rhinitis in her daughter; Alzheimer's disease in her maternal grandmother; COPD in her maternal grandfather; Cancer in her maternal grandfather, paternal grandfather, and paternal grandmother; Cerebral aneurysm in her paternal  grandmother; Diabetes in her maternal grandfather and mother; Early death in her father and paternal grandmother; Healthy in her brother; Heart attack in her maternal grandmother; Miscarriages / [place] in her mother; Sarcoidosis in her father; Stroke in her mother. Social History:  Patient  reports that she has never smoked. She has never used smokeless tobacco. She reports that she has current or past drug history. Drug: Hydromorphone. She reports that she does not drink alcohol.  Review of  Systems: Constitutional: Negative for fever malaise or anorexia Cardiovascular: negative for chest pain Respiratory: negative for SOB or persistent cough Gastrointestinal: negative for abdominal pain  Objective  Vitals: Ht 5\' 5"  (1.651 m)   Wt (!) 341 lb (154.7 kg)   BMI 56.75 kg/m  General: no acute distress , A&Ox3    Commons side effects, risks, benefits, and alternatives for medications and treatment plan prescribed today were discussed, and the patient expressed understanding of the given instructions. Patient is instructed to call or message via MyChart if he/she has any questions or concerns regarding our treatment plan. No barriers to understanding were identified. We discussed Red Flag symptoms and signs in detail. Patient expressed understanding regarding what to do in case of urgent or emergency type symptoms.   Medication list was reconciled, printed and provided to the patient in AVS. Patient instructions and summary information was reviewed with the patient as documented in the AVS. This note was prepared with assistance of Dragon voice recognition software. Occasional wrong-word or sound-a-like substitutions may have occurred due to the inherent limitations of voice recognition software

## 2017-08-03 NOTE — Patient Instructions (Signed)
Please follow up if symptoms do not improve or as needed.   Sign a ROR so I may get lab reports from your GYN.

## 2017-08-07 ENCOUNTER — Encounter: Payer: Self-pay | Admitting: Emergency Medicine

## 2017-08-08 DIAGNOSIS — L299 Pruritus, unspecified: Secondary | ICD-10-CM | POA: Diagnosis not present

## 2017-08-08 DIAGNOSIS — N898 Other specified noninflammatory disorders of vagina: Secondary | ICD-10-CM | POA: Diagnosis not present

## 2017-08-18 DIAGNOSIS — D259 Leiomyoma of uterus, unspecified: Secondary | ICD-10-CM | POA: Diagnosis not present

## 2017-08-18 DIAGNOSIS — N852 Hypertrophy of uterus: Secondary | ICD-10-CM | POA: Diagnosis not present

## 2017-08-18 DIAGNOSIS — N898 Other specified noninflammatory disorders of vagina: Secondary | ICD-10-CM | POA: Diagnosis not present

## 2017-09-11 ENCOUNTER — Encounter: Payer: BLUE CROSS/BLUE SHIELD | Admitting: Family Medicine

## 2017-09-14 DIAGNOSIS — S41102A Unspecified open wound of left upper arm, initial encounter: Secondary | ICD-10-CM | POA: Diagnosis not present

## 2017-09-16 DIAGNOSIS — J029 Acute pharyngitis, unspecified: Secondary | ICD-10-CM | POA: Diagnosis not present

## 2017-09-17 ENCOUNTER — Ambulatory Visit (INDEPENDENT_AMBULATORY_CARE_PROVIDER_SITE_OTHER): Payer: BLUE CROSS/BLUE SHIELD | Admitting: Family Medicine

## 2017-09-17 ENCOUNTER — Encounter: Payer: Self-pay | Admitting: Family Medicine

## 2017-09-17 ENCOUNTER — Encounter

## 2017-09-17 ENCOUNTER — Other Ambulatory Visit: Payer: Self-pay

## 2017-09-17 VITALS — BP 132/84 | HR 76 | Temp 98.8°F | Ht 65.0 in | Wt 340.4 lb

## 2017-09-17 DIAGNOSIS — I10 Essential (primary) hypertension: Secondary | ICD-10-CM

## 2017-09-17 DIAGNOSIS — Z6841 Body Mass Index (BMI) 40.0 and over, adult: Secondary | ICD-10-CM

## 2017-09-17 DIAGNOSIS — Z9884 Bariatric surgery status: Secondary | ICD-10-CM

## 2017-09-17 DIAGNOSIS — Z0000 Encounter for general adult medical examination without abnormal findings: Secondary | ICD-10-CM | POA: Diagnosis not present

## 2017-09-17 LAB — CBC WITH DIFFERENTIAL/PLATELET
BASOS PCT: 0.7 % (ref 0.0–3.0)
Basophils Absolute: 0 10*3/uL (ref 0.0–0.1)
Eosinophils Absolute: 0 10*3/uL (ref 0.0–0.7)
Eosinophils Relative: 0.6 % (ref 0.0–5.0)
HEMATOCRIT: 40.3 % (ref 36.0–46.0)
Hemoglobin: 12.8 g/dL (ref 12.0–15.0)
LYMPHS PCT: 29.5 % (ref 12.0–46.0)
Lymphs Abs: 1.9 10*3/uL (ref 0.7–4.0)
MCHC: 31.8 g/dL (ref 30.0–36.0)
MCV: 80.6 fl (ref 78.0–100.0)
MONOS PCT: 6.1 % (ref 3.0–12.0)
Monocytes Absolute: 0.4 10*3/uL (ref 0.1–1.0)
Neutro Abs: 4 10*3/uL (ref 1.4–7.7)
Neutrophils Relative %: 63.1 % (ref 43.0–77.0)
PLATELETS: 244 10*3/uL (ref 150.0–400.0)
RBC: 5 Mil/uL (ref 3.87–5.11)
RDW: 14.8 % (ref 11.5–15.5)
WBC: 6.4 10*3/uL (ref 4.0–10.5)

## 2017-09-17 LAB — COMPREHENSIVE METABOLIC PANEL
ALK PHOS: 64 U/L (ref 39–117)
ALT: 11 U/L (ref 0–35)
AST: 12 U/L (ref 0–37)
Albumin: 3.7 g/dL (ref 3.5–5.2)
BILIRUBIN TOTAL: 0.6 mg/dL (ref 0.2–1.2)
BUN: 9 mg/dL (ref 6–23)
CALCIUM: 9.4 mg/dL (ref 8.4–10.5)
CO2: 27 meq/L (ref 19–32)
Chloride: 104 mEq/L (ref 96–112)
Creatinine, Ser: 0.82 mg/dL (ref 0.40–1.20)
GFR: 99.97 mL/min (ref >60.00)
Glucose, Bld: 80 mg/dL (ref 70–99)
Potassium: 4.2 mEq/L (ref 3.5–5.1)
Sodium: 138 mEq/L (ref 135–145)
Total Protein: 7.6 g/dL (ref 6.0–8.3)

## 2017-09-17 LAB — LIPID PANEL
Cholesterol: 207 mg/dL — ABNORMAL HIGH (ref 0–200)
HDL: 45 mg/dL (ref >39.00)
LDL Cholesterol: 145 mg/dL — ABNORMAL HIGH (ref 0–99)
NONHDL: 161.75
Total CHOL/HDL Ratio: 5
Triglycerides: 83 mg/dL (ref 0.0–149.0)
VLDL: 16.6 mg/dL (ref 0.0–40.0)

## 2017-09-17 LAB — TSH: TSH: 1.19 u[IU]/mL (ref 0.35–4.50)

## 2017-09-17 LAB — VITAMIN D 25 HYDROXY (VIT D DEFICIENCY, FRACTURES): VITD: 13.54 ng/mL — ABNORMAL LOW (ref 30.00–100.00)

## 2017-09-17 NOTE — Patient Instructions (Signed)
Please return in 6 months to recheck blood pressure  I will release your lab results to you on your MyChart account with further instructions. Please reply with any questions.   If you have any questions or concerns, please don't hesitate to send me a message via MyChart or call the office at [phone]. Thank you for visiting with [place] today! It's our pleasure caring for you.  Please do these things to maintain good health!   Exercise at least 30-45 minutes a day,  4-5 days a week.   Eat a low-fat diet with lots of fruits and vegetables, up to 7-9 servings per day.  Drink plenty of water daily. Try to drink 8 8oz glasses per day.  Seatbelts can save your life. Always wear your seatbelt.  Place Smoke Detectors on every level of your home and check batteries every year.  Schedule an appointment with an eye doctor for an eye exam every 1-2 years  Safe sex - use condoms to protect yourself from STDs if you could be exposed to these types of infections. Use birth control if you do not want to become pregnant and are sexually active.  Avoid heavy alcohol use. If you drink, keep it to less than 2 drinks/day and not every day.  [place].  Choose someone you trust that could speak for you if you became unable to speak for yourself.  Depression is common in our stressful world.If you're feeling down or losing interest in things you normally enjoy, please come in for a visit.  If anyone is threatening or hurting you, please get help. Physical or Emotional Violence is never OK.

## 2017-09-17 NOTE — Progress Notes (Signed)
Subjective  Chief Complaint  Patient presents with  . Annual Exam    doing well, HM up to date  . Skin Problem    bump under left arm arm, draining x 3 weeks     HPI: Melissa Erickson is a 37 y.o. female who presents to [facility] at [facility] today for a Female Wellness Visit. She recently saw GYN for f/u PCOS- offered hysterectomy complete due to large ovarian cysts and menorrhagia.   Wellness Visit: annual visit with health maintenance review and exam without Pap   Doing fine. Tolerating medications. No new concerns.   Assessment  1. Annual physical exam   2. Morbid obesity with BMI of 50.0-59.9, adult ([place])   3. Gastric bypass status for obesity   4. Essential hypertension      Plan  Female Wellness Visit:  Age appropriate Health Maintenance and Prevention measures were discussed with patient. Included topics are cancer screening recommendations, ways to keep healthy (see AVS) including dietary and exercise recommendations, regular eye and dental care, use of seat belts, and avoidance of moderate alcohol use and tobacco use.   BMI: discussed patient's BMI and encouraged positive lifestyle modifications to help get to or maintain a target BMI.  HM needs and immunizations were addressed and ordered. See below for orders. See HM and immunization section for updates.  Routine labs and screening tests ordered including cmp, cbc and lipids where appropriate.  Discussed recommendations regarding Vit D and calcium supplementation (see AVS)  HTN is controlled.   Follow up: 6 months for bp recheck   Orders Placed This Encounter  Procedures  . CBC with Differential/Platelet  . Comprehensive metabolic panel  . Lipid panel  . TSH  . VITAMIN D 25 Hydroxy (Vit-D Deficiency, Fractures)  . Anemia Profile B   No orders of the defined types were placed in this encounter.     Lifestyle: Body mass index is 56.65 kg/m. Wt Readings from Last 3 Encounters:    09/17/17 (!) 340 lb 6.4 oz (154.4 kg)  08/03/17 (!) 341 lb (154.7 kg)  07/21/17 (!) 342 lb 3.2 oz (155.2 kg)     Patient Active Problem List   Diagnosis Date Noted  . Gastric bypass status for obesity 07/21/2017    Priority: High  . History of obstructive sleep apnea 08/08/2013    Priority: High  . Morbid obesity with BMI of 50.0-59.9, adult ([place]) 06/14/2012    Priority: High  . PCOS (polycystic ovarian syndrome) 07/21/2017    Priority: Medium  . Suppurative hidradenitis 07/21/2017    Priority: Medium  . NSTEMI (non-ST elevated myocardial infarction), type 2 secondary to demand ischemia sue to sepsis 07/20/2013    Priority: Medium  . History of ARDS 07/18/2013    Priority: Medium    Overview:  ARDS requiring intubation after gastric sleeve bypass 06/2013  ARDS requiring intubation after gastric sleeve bypass 06/2013   . Diastolic dysfunction with heart failure ([place]) 07/18/2013    Priority: Medium    Overview:  Echo EF 55%  Echo EF 55%   . Asthma, mild persistent 03/11/2013    Priority: Medium    Started Advair 250/500 02/2013   . Migraine 06/14/2012    Priority: Medium  . Spondylosis of lumbar region without myelopathy or radiculopathy 07/21/2017  . Primary osteoarthritis of both hips 07/21/2017  . Essential hypertension 07/18/2013   Health Maintenance  Topic Date Due  . INFLUENZA VACCINE  09/17/2017  . HIV Screening  07/22/2018 (  Originally 01/[birth year]/1994)  . PAP SMEAR  07/24/2022  . TETANUS/TDAP  09/13/2026   Immunization History  Administered Date(s) Administered  . Pneumococcal Polysaccharide-23 04/01/2013  . Tdap 09/12/2016   We updated and reviewed the patient's past history in detail and it is documented below. Allergies: Patient has No Known Allergies. Past Medical History Patient  has a past medical history of Allergy, Asthma, Chicken pox, Environmental allergies, Essential hypertension (07/18/2013), Gallstones, Heart attack ([place]), Migraine, Polycystic  ovarian syndrome, Suppurative hidradenitis (07/21/2017), and Ventral hernia. Past Surgical History Patient  has a past surgical history that includes Cholecystectomy; Laparoscopic gastric sleeve resection; laparoscopy (N/A, 07/19/2013); Esophagogastroduodenoscopy (N/A, 07/19/2013); Cesarean section (2007); and Breast biopsy (Right, 2001). Family History: Patient family history includes Alcohol abuse in her father; Allergic rhinitis in her daughter; Alzheimer's disease in her maternal grandmother; COPD in her maternal grandfather; Cancer in her maternal grandfather, paternal grandfather, and paternal grandmother; Cerebral aneurysm in her paternal grandmother; Diabetes in her maternal grandfather and mother; Early death in her father and paternal grandmother; Healthy in her brother; Heart attack in her maternal grandmother; Miscarriages / [place] in her mother; Sarcoidosis in her father; Stroke in her mother. Social History:  Patient  reports that she has never smoked. She has never used smokeless tobacco. She reports that she has current or past drug history. Drug: Hydromorphone. She reports that she does not drink alcohol.  Review of Systems: Constitutional: negative for fever or malaise Ophthalmic: negative for photophobia, double vision or loss of vision Cardiovascular: negative for chest pain, dyspnea on exertion, or new LE swelling Respiratory: negative for SOB or persistent cough Gastrointestinal: negative for abdominal pain, change in bowel habits or melena Genitourinary: negative for dysuria or gross hematuria, no abnormal uterine bleeding or disharge Musculoskeletal: negative for new gait disturbance or muscular weakness Integumentary: negative for new or persistent rashes, no breast lumps Neurological: negative for TIA or stroke symptoms Psychiatric: negative for SI or delusions Allergic/Immunologic: negative for hives Patient Care Team    Relationship Specialty Notifications Start End    Leamon Arnt, MD PCP - General Family Medicine  07/21/17   Sanjuana Kava, MD Referring Physician Obstetrics and Gynecology  07/21/17     Objective  Vitals: BP 132/84   Pulse 76   Temp 98.8 F (37.1 C)   Ht 5\' 5"  (1.651 m)   Wt (!) 340 lb 6.4 oz (154.4 kg)   LMP 09/04/2017   SpO2 98%   BMI 56.65 kg/m  General:  Well developed, well nourished, no acute distress  Psych:  Alert and orientedx3,normal mood and affect HEENT:  Normocephalic, atraumatic, non-icteric sclera, PERRL, oropharynx is clear without mass or exudate, supple neck without adenopathy, mass or thyromegaly Cardiovascular:  Normal S1, S2, RRR without gallop, rub or murmur, nondisplaced PMI Respiratory:  Good breath sounds bilaterally, CTAB with normal respiratory effort Gastrointestinal: normal bowel sounds, soft, non-tender, no noted masses. No HSM MSK: no deformities, contusions. Joints are without erythema or swelling. Spine and CVA region are nontender Skin:  Warm, no rashes or suspicious lesions noted Neurologic:    Mental status is normal. CN 2-11 are normal. Gross motor and sensory exams are normal. Normal gait. No tremor   Commons side effects, risks, benefits, and alternatives for medications and treatment plan prescribed today were discussed, and the patient expressed understanding of the given instructions. Patient is instructed to call or message via MyChart if he/she has any questions or concerns regarding our treatment plan. No barriers to understanding were identified.  We discussed Red Flag symptoms and signs in detail. Patient expressed understanding regarding what to do in case of urgent or emergency type symptoms.   Medication list was reconciled, printed and provided to the patient in AVS. Patient instructions and summary information was reviewed with the patient as documented in the AVS. This note was prepared with assistance of Dragon voice recognition software. Occasional wrong-word or sound-a-like  substitutions may have occurred due to the inherent limitations of voice recognition software

## 2017-09-18 ENCOUNTER — Encounter: Payer: Self-pay | Admitting: Family Medicine

## 2017-09-22 LAB — ANEMIA PROFILE B
%SAT: 11 % — AB (ref 16–45)
FERRITIN: 7 ng/mL — AB (ref 16–154)
Folate: 5.6 ng/mL
Iron: 43 ug/dL (ref 40–190)
TIBC: 388 mcg/dL (calc) (ref 250–450)
Vitamin B-12: 867 pg/mL (ref 200–1100)

## 2017-09-22 MED ORDER — VITAMIN D (ERGOCALCIFEROL) 1.25 MG (50000 UNIT) PO CAPS: 50000.0000 [IU] | ORAL_CAPSULE | ORAL | 0 refills | Status: AC

## 2017-09-22 NOTE — Addendum Note (Signed)
Addended by: Billey Chang on: 09/22/2017 02:44 PM   Modules accepted: Orders

## 2017-10-11 ENCOUNTER — Encounter (HOSPITAL_BASED_OUTPATIENT_CLINIC_OR_DEPARTMENT_OTHER): Payer: Self-pay | Admitting: *Deleted

## 2017-10-11 ENCOUNTER — Emergency Department (HOSPITAL_BASED_OUTPATIENT_CLINIC_OR_DEPARTMENT_OTHER)
Admission: EM | Admit: 2017-10-11 | Discharge: 2017-10-11 | Disposition: A | Discharge status: Home / Self Care | Payer: BLUE CROSS/BLUE SHIELD | Attending: Emergency Medicine | Admitting: Emergency Medicine

## 2017-10-11 ENCOUNTER — Other Ambulatory Visit: Payer: Self-pay

## 2017-10-11 DIAGNOSIS — Z5321 Procedure and treatment not carried out due to patient leaving prior to being seen by health care provider: Secondary | ICD-10-CM | POA: Diagnosis not present

## 2017-10-11 DIAGNOSIS — L02412 Cutaneous abscess of left axilla: Secondary | ICD-10-CM | POA: Insufficient documentation

## 2017-10-11 DIAGNOSIS — R200 Anesthesia of skin: Secondary | ICD-10-CM | POA: Diagnosis not present

## 2017-10-11 NOTE — ED Notes (Signed)
States," I have to go home, I have a sick child"

## 2017-10-11 NOTE — ED Triage Notes (Signed)
Pt reports she had a boil rupture in her left axilla 3 months ago. States it is still draining and her arm is hurting and she has some numbness. States "it hurts for me to lift my arm"

## 2017-10-12 NOTE — ED Notes (Signed)
Follow up call made  Pt states is going to follow up w pcp  10/12/17  1219  s Lashanta Elbe rn

## 2018-03-28 ENCOUNTER — Encounter (HOSPITAL_BASED_OUTPATIENT_CLINIC_OR_DEPARTMENT_OTHER): Payer: Self-pay | Admitting: Emergency Medicine

## 2018-03-28 ENCOUNTER — Other Ambulatory Visit: Payer: Self-pay

## 2018-03-28 ENCOUNTER — Emergency Department (HOSPITAL_BASED_OUTPATIENT_CLINIC_OR_DEPARTMENT_OTHER)
Admission: EM | Admit: 2018-03-28 | Discharge: 2018-03-28 | Disposition: A | Discharge status: Home / Self Care | Payer: BLUE CROSS/BLUE SHIELD | Attending: Emergency Medicine | Admitting: Emergency Medicine

## 2018-03-28 DIAGNOSIS — I110 Hypertensive heart disease with heart failure: Secondary | ICD-10-CM | POA: Insufficient documentation

## 2018-03-28 DIAGNOSIS — I5030 Unspecified diastolic (congestive) heart failure: Secondary | ICD-10-CM | POA: Insufficient documentation

## 2018-03-28 DIAGNOSIS — J0111 Acute recurrent frontal sinusitis: Secondary | ICD-10-CM | POA: Insufficient documentation

## 2018-03-28 DIAGNOSIS — J45909 Unspecified asthma, uncomplicated: Secondary | ICD-10-CM | POA: Insufficient documentation

## 2018-03-28 DIAGNOSIS — Z79899 Other long term (current) drug therapy: Secondary | ICD-10-CM | POA: Insufficient documentation

## 2018-03-28 MED ORDER — PREDNISONE 10 MG PO TABS: 50.0000 mg | ORAL_TABLET | Freq: Every day | ORAL | 0 refills | Status: DC

## 2018-03-28 MED ORDER — CETIRIZINE-PSEUDOEPHEDRINE ER 5-120 MG PO TB12: 1.0000 | ORAL_TABLET | Freq: Every day | ORAL | 0 refills | Status: DC

## 2018-03-28 MED ORDER — SALINE SPRAY 0.65 % NA SOLN: 1.0000 | NASAL | 1 refills | Status: DC | PRN

## 2018-03-28 MED ORDER — IBUPROFEN 600 MG PO TABS: 600.0000 mg | ORAL_TABLET | Freq: Four times a day (QID) | ORAL | 0 refills | Status: DC | PRN

## 2018-03-28 NOTE — ED Triage Notes (Signed)
Congestion and sinus pressure  X 1 week.

## 2018-03-28 NOTE — Discharge Instructions (Signed)
We saw in the ER for Symptoms that are consistent with sinusitis.   Please take the medications prescribed for symptom control.

## 2018-03-29 NOTE — ED Provider Notes (Signed)
[facility] EMERGENCY DEPARTMENT Provider Note   CSN: [id number] Arrival date & time: 03/28/18  1336     History   Chief Complaint Chief Complaint  Patient presents with  . Nasal Congestion    HPI Melissa Erickson is a 38 y.o. female.  HPI 38 year old female comes in with chief complaint of nasal congestion. Patient has history of asthma, sinusitis, PCOS.  She reports that she has been having congestion and sinus pressure for the last 1 week.  She denies any new cough, fevers, chills.  In the past she has required antibiotics for her sinusitis.  Her sinus pressure is located in the maxillary region.  Patient has taken over-the-counter medications without significant relief.  Past Medical History:  Diagnosis Date  . Allergy   . Asthma   . Chicken pox   . Environmental allergies    Seasonal  . Essential hypertension 07/18/2013  . Gallstones   . Heart attack ([place])    2015  . Migraine   . Polycystic ovarian syndrome   . Suppurative hidradenitis 07/21/2017  . Ventral hernia     Patient Active Problem List   Diagnosis Date Noted  . PCOS (polycystic ovarian syndrome) 07/21/2017  . Suppurative hidradenitis 07/21/2017  . Gastric bypass status for obesity 07/21/2017  . Spondylosis of lumbar region without myelopathy or radiculopathy 07/21/2017  . Primary osteoarthritis of both hips 07/21/2017  . History of obstructive sleep apnea 08/08/2013  . NSTEMI (non-ST elevated myocardial infarction), type 2 secondary to demand ischemia sue to sepsis 07/20/2013  . History of ARDS 07/18/2013  . Essential hypertension 07/18/2013  . Diastolic dysfunction with heart failure ([place]) 07/18/2013  . Asthma, mild persistent 03/11/2013  . Migraine 06/14/2012  . Morbid obesity with BMI of 50.0-59.9, adult ([place]) 06/14/2012    Past Surgical History:  Procedure Laterality Date  . BREAST BIOPSY Right 2001   Normal  . CESAREAN SECTION  2007  . CHOLECYSTECTOMY    .  ESOPHAGOGASTRODUODENOSCOPY N/A 07/19/2013   Procedure: ESOPHAGOGASTRODUODENOSCOPY (EGD);  Surgeon: Harl Bowie, MD;  Location: [facility];  Service: General;  Laterality: N/A;  . LAPAROSCOPIC GASTRIC SLEEVE RESECTION    . LAPAROSCOPY N/A 07/19/2013   Procedure: LAPAROSCOPY DIAGNOSTIC;  Surgeon: Harl Bowie, MD;  Location: [facility];  Service: General;  Laterality: N/A;     OB History   No obstetric history on file.      Home Medications    Prior to Admission medications   Medication Sig Start Date End Date Taking? Authorizing Provider  albuterol (PROVENTIL HFA;VENTOLIN HFA) 108 (90 Base) MCG/ACT inhaler 2 puffs every 4-6 hours as needed for asthma/cough/wheeze 09/14/16   [provider]  cetirizine-pseudoephedrine (ZYRTEC-D) 5-120 MG tablet Take 1 tablet by mouth daily. 03/28/18   Varney Biles, MD  Cholecalciferol (VITAMIN D) 2000 units tablet Take 2 tablets (4,000 Units total) by mouth daily. 07/21/17   Leamon Arnt, MD  ibuprofen (ADVIL,MOTRIN) 600 MG tablet Take 1 tablet (600 mg total) by mouth every 6 (six) hours as needed. 03/28/18   Varney Biles, MD  predniSONE (DELTASONE) 10 MG tablet Take 5 tablets (50 mg total) by mouth daily. 03/28/18   Varney Biles, MD  sodium chloride (OCEAN) 0.65 % SOLN nasal spray Place 1 spray into both nostrils as needed for congestion. 03/28/18   Varney Biles, MD    Family History Family History  Problem Relation Age of Onset  . Diabetes Mother   . Stroke Mother   . Miscarriages /  Stillbirths Mother   . Sarcoidosis Father   . Alcohol abuse Father   . Early death Father   . Healthy Brother   . Alzheimer's disease Maternal Grandmother   . Heart attack Maternal Grandmother   . Diabetes Maternal Grandfather   . COPD Maternal Grandfather   . Cancer Maternal Grandfather   . Cancer Paternal Grandmother   . Cerebral aneurysm Paternal Grandmother   . Early death Paternal Grandmother   . Cancer Paternal Grandfather   . Allergic  rhinitis Daughter     Social History Social History   Tobacco Use  . Smoking status: Never Smoker  . Smokeless tobacco: Never Used  Substance Use Topics  . Alcohol use: No  . Drug use: Never    Types: Hydromorphone     Allergies   Patient has no known allergies.   Review of Systems Review of Systems  Constitutional: Positive for activity change.  HENT: Positive for congestion, sinus pressure and sinus pain.   Respiratory: Negative for shortness of breath.   Cardiovascular: Negative for chest pain.  Allergic/Immunologic: Negative for immunocompromised state.     Physical Exam Updated Vital Signs BP 120/80   Pulse 81   Temp 98.5 F (36.9 C)   Resp 18   Ht 5\' 5"  (1.651 m)   Wt (!) 156.5 kg   SpO2 100%   BMI 57.41 kg/m   Physical Exam Vitals signs and nursing note reviewed.  Constitutional:      Appearance: She is well-developed.  HENT:     Head: Normocephalic and atraumatic.     Nose: Congestion present. No rhinorrhea.  Neck:     Musculoskeletal: Normal range of motion and neck supple.  Cardiovascular:     Rate and Rhythm: Normal rate.     Pulses: Normal pulses.  Pulmonary:     Effort: Pulmonary effort is normal. No respiratory distress.     Breath sounds: No wheezing.  Abdominal:     General: Bowel sounds are normal.  Skin:    General: Skin is warm and dry.  Neurological:     Mental Status: She is alert and oriented to person, place, and time.      ED Treatments / Results  Labs (all labs ordered are listed, but only abnormal results are displayed) Labs Reviewed - No data to display  EKG None  Radiology No results found.  Procedures Procedures (including critical care time)  Medications Ordered in ED Medications - No data to display   Initial Impression / Assessment and Plan / ED Course  I have reviewed the triage vital signs and the nursing notes.  Pertinent labs & imaging results that were available during my care of the patient  were reviewed by me and considered in my medical decision making (see chart for details).     38 year old female comes in with chief complaint of congestion.  She has history of sinusitis and she reports that her current symptoms are similar to her prior sinusitis.  Patient states that at the moment she is having congestion with thick phlegm.  She is not having any fevers and she is not toxic appearing.  Patient is also immunocompetent.  We will treat the patient conservatively with supportive meds.  Strict ER return precautions discussed.  Final Clinical Impressions(s) / ED Diagnoses   Final diagnoses:  Acute recurrent frontal sinusitis    ED Discharge Orders         Ordered    cetirizine-pseudoephedrine (ZYRTEC-D) 5-120 MG tablet  Daily     03/28/18 1525    predniSONE (DELTASONE) 10 MG tablet  Daily     03/28/18 1525    sodium chloride (OCEAN) 0.65 % SOLN nasal spray  As needed     03/28/18 1525    ibuprofen (ADVIL,MOTRIN) 600 MG tablet  Every 6 hours PRN     03/28/18 1526           Varney Biles, MD 03/29/18 1249

## 2018-06-18 DIAGNOSIS — M25511 Pain in right shoulder: Secondary | ICD-10-CM | POA: Diagnosis not present

## 2018-06-18 DIAGNOSIS — M7551 Bursitis of right shoulder: Secondary | ICD-10-CM | POA: Diagnosis not present

## 2018-06-21 DIAGNOSIS — G43909 Migraine, unspecified, not intractable, without status migrainosus: Secondary | ICD-10-CM | POA: Diagnosis not present

## 2018-06-21 DIAGNOSIS — J301 Allergic rhinitis due to pollen: Secondary | ICD-10-CM | POA: Diagnosis not present

## 2018-06-25 DIAGNOSIS — R030 Elevated blood-pressure reading, without diagnosis of hypertension: Secondary | ICD-10-CM | POA: Diagnosis not present

## 2018-06-25 DIAGNOSIS — M7541 Impingement syndrome of right shoulder: Secondary | ICD-10-CM | POA: Diagnosis not present

## 2018-07-06 DIAGNOSIS — L309 Dermatitis, unspecified: Secondary | ICD-10-CM | POA: Diagnosis not present

## 2018-07-06 DIAGNOSIS — D171 Benign lipomatous neoplasm of skin and subcutaneous tissue of trunk: Secondary | ICD-10-CM | POA: Diagnosis not present

## 2018-07-08 DIAGNOSIS — N915 Oligomenorrhea, unspecified: Secondary | ICD-10-CM | POA: Diagnosis not present

## 2018-07-08 DIAGNOSIS — Z118 Encounter for screening for other infectious and parasitic diseases: Secondary | ICD-10-CM | POA: Diagnosis not present

## 2018-07-08 DIAGNOSIS — Z1151 Encounter for screening for human papillomavirus (HPV): Secondary | ICD-10-CM | POA: Diagnosis not present

## 2018-07-08 DIAGNOSIS — Z1389 Encounter for screening for other disorder: Secondary | ICD-10-CM | POA: Diagnosis not present

## 2018-07-08 DIAGNOSIS — Z113 Encounter for screening for infections with a predominantly sexual mode of transmission: Secondary | ICD-10-CM | POA: Diagnosis not present

## 2018-07-08 DIAGNOSIS — Z6841 Body Mass Index (BMI) 40.0 and over, adult: Secondary | ICD-10-CM | POA: Diagnosis not present

## 2018-07-08 DIAGNOSIS — Z01419 Encounter for gynecological examination (general) (routine) without abnormal findings: Secondary | ICD-10-CM | POA: Diagnosis not present

## 2018-07-20 DIAGNOSIS — N760 Acute vaginitis: Secondary | ICD-10-CM | POA: Diagnosis not present

## 2018-07-20 DIAGNOSIS — B9689 Other specified bacterial agents as the cause of diseases classified elsewhere: Secondary | ICD-10-CM | POA: Diagnosis not present

## 2018-07-22 DIAGNOSIS — R7301 Impaired fasting glucose: Secondary | ICD-10-CM | POA: Diagnosis not present

## 2018-07-22 DIAGNOSIS — L680 Hirsutism: Secondary | ICD-10-CM | POA: Diagnosis not present

## 2018-07-22 DIAGNOSIS — E282 Polycystic ovarian syndrome: Secondary | ICD-10-CM | POA: Diagnosis not present

## 2018-07-22 DIAGNOSIS — E7800 Pure hypercholesterolemia, unspecified: Secondary | ICD-10-CM | POA: Diagnosis not present

## 2019-01-08 ENCOUNTER — Encounter (HOSPITAL_COMMUNITY): Payer: Self-pay | Admitting: Emergency Medicine

## 2019-01-08 ENCOUNTER — Emergency Department (HOSPITAL_COMMUNITY)
Admission: EM | Admit: 2019-01-08 | Discharge: 2019-01-08 | Disposition: A | Discharge status: Home / Self Care | Payer: BC Managed Care – PPO | Attending: Emergency Medicine | Admitting: Emergency Medicine

## 2019-01-08 ENCOUNTER — Other Ambulatory Visit: Payer: Self-pay

## 2019-01-08 ENCOUNTER — Emergency Department (HOSPITAL_COMMUNITY): Payer: BC Managed Care – PPO

## 2019-01-08 DIAGNOSIS — W228XXA Striking against or struck by other objects, initial encounter: Secondary | ICD-10-CM | POA: Insufficient documentation

## 2019-01-08 DIAGNOSIS — Y929 Unspecified place or not applicable: Secondary | ICD-10-CM | POA: Insufficient documentation

## 2019-01-08 DIAGNOSIS — Y939 Activity, unspecified: Secondary | ICD-10-CM | POA: Diagnosis not present

## 2019-01-08 DIAGNOSIS — I10 Essential (primary) hypertension: Secondary | ICD-10-CM | POA: Insufficient documentation

## 2019-01-08 DIAGNOSIS — Y999 Unspecified external cause status: Secondary | ICD-10-CM | POA: Insufficient documentation

## 2019-01-08 DIAGNOSIS — Z79899 Other long term (current) drug therapy: Secondary | ICD-10-CM | POA: Insufficient documentation

## 2019-01-08 DIAGNOSIS — S90112A Contusion of left great toe without damage to nail, initial encounter: Secondary | ICD-10-CM | POA: Diagnosis not present

## 2019-01-08 DIAGNOSIS — J45909 Unspecified asthma, uncomplicated: Secondary | ICD-10-CM | POA: Insufficient documentation

## 2019-01-08 DIAGNOSIS — S99922A Unspecified injury of left foot, initial encounter: Secondary | ICD-10-CM | POA: Diagnosis present

## 2019-01-08 MED ORDER — IBUPROFEN 800 MG PO TABS
800.0000 mg | ORAL_TABLET | Freq: Once | ORAL | Status: AC
  Administered 2019-01-08: 800 mg via ORAL
  Filled 2019-01-08: qty 1

## 2019-01-08 NOTE — Discharge Instructions (Addendum)
1. Medications: alternate Ibuprofen and tylenol for pain control, usual home medications 2. Treatment: rest, ice, elevate, gentle stretching 3. Follow Up: Please followup with your PCP in 1 week if no improvement; Please return to the ER for worsening symptoms, signs of infection or other concerns

## 2019-01-08 NOTE — ED Triage Notes (Signed)
Pt arriving with complaint of left great toe pain. Pt reports 4 canned goods fell on her toe. Pt has limited movement.

## 2019-01-08 NOTE — ED Provider Notes (Signed)
[facility] DEPT Provider Note   CSN: [id number] Arrival date & time: 01/08/19  2036     History   Chief Complaint Chief Complaint  Patient presents with  . Toe Pain    HPI Melissa Erickson is a 38 y.o. female with a hx of HTN presents to the Emergency Department complaining of acute, persistent, pain in the left great toe just prior to arrival after dropping for cans on her toe.  Patient reports she has been able to walk but it is painful.  She has associated swelling.  No treatments prior to arrival.  No alleviating factors.      The history is provided by the patient and medical records. No language interpreter was used.    Past Medical History:  Diagnosis Date  . Allergy   . Asthma   . Chicken pox   . Environmental allergies    Seasonal  . Essential hypertension 07/18/2013  . Gallstones   . Heart attack ([place])    2015  . Migraine   . Polycystic ovarian syndrome   . Suppurative hidradenitis 07/21/2017  . Ventral hernia     Patient Active Problem List   Diagnosis Date Noted  . PCOS (polycystic ovarian syndrome) 07/21/2017  . Suppurative hidradenitis 07/21/2017  . Gastric bypass status for obesity 07/21/2017  . Spondylosis of lumbar region without myelopathy or radiculopathy 07/21/2017  . Primary osteoarthritis of both hips 07/21/2017  . History of obstructive sleep apnea 08/08/2013  . NSTEMI (non-ST elevated myocardial infarction), type 2 secondary to demand ischemia sue to sepsis 07/20/2013  . History of ARDS 07/18/2013  . Essential hypertension 07/18/2013  . Diastolic dysfunction with heart failure ([place]) 07/18/2013  . Asthma, mild persistent 03/11/2013  . Migraine 06/14/2012  . Morbid obesity with BMI of 50.0-59.9, adult ([place]) 06/14/2012    Past Surgical History:  Procedure Laterality Date  . BREAST BIOPSY Right 2001   Normal  . CESAREAN SECTION  2007  . CHOLECYSTECTOMY    . ESOPHAGOGASTRODUODENOSCOPY N/A 07/19/2013   Procedure: ESOPHAGOGASTRODUODENOSCOPY (EGD);  Surgeon: Harl Bowie, MD;  Location: [facility];  Service: General;  Laterality: N/A;  . LAPAROSCOPIC GASTRIC SLEEVE RESECTION    . LAPAROSCOPY N/A 07/19/2013   Procedure: LAPAROSCOPY DIAGNOSTIC;  Surgeon: Harl Bowie, MD;  Location: [facility];  Service: General;  Laterality: N/A;     OB History   No obstetric history on file.      Home Medications    Prior to Admission medications   Medication Sig Start Date End Date Taking? Authorizing Provider  albuterol (PROVENTIL HFA;VENTOLIN HFA) 108 (90 Base) MCG/ACT inhaler 2 puffs every 4-6 hours as needed for asthma/cough/wheeze 09/14/16   [provider]  cetirizine-pseudoephedrine (ZYRTEC-D) 5-120 MG tablet Take 1 tablet by mouth daily. 03/28/18   Varney Biles, MD  Cholecalciferol (VITAMIN D) 2000 units tablet Take 2 tablets (4,000 Units total) by mouth daily. 07/21/17   Leamon Arnt, MD  ibuprofen (ADVIL,MOTRIN) 600 MG tablet Take 1 tablet (600 mg total) by mouth every 6 (six) hours as needed. 03/28/18   Varney Biles, MD  predniSONE (DELTASONE) 10 MG tablet Take 5 tablets (50 mg total) by mouth daily. 03/28/18   Varney Biles, MD  sodium chloride (OCEAN) 0.65 % SOLN nasal spray Place 1 spray into both nostrils as needed for congestion. 03/28/18   Varney Biles, MD    Family History Family History  Problem Relation Age of Onset  . Diabetes Mother   . Stroke  Mother   . Miscarriages / [place] Mother   . Sarcoidosis Father   . Alcohol abuse Father   . Early death Father   . Healthy Brother   . Alzheimer's disease Maternal Grandmother   . Heart attack Maternal Grandmother   . Diabetes Maternal Grandfather   . COPD Maternal Grandfather   . Cancer Maternal Grandfather   . Cancer Paternal Grandmother   . Cerebral aneurysm Paternal Grandmother   . Early death Paternal Grandmother   . Cancer Paternal Grandfather   . Allergic rhinitis Daughter     Social History Social  History   Tobacco Use  . Smoking status: Never Smoker  . Smokeless tobacco: Never Used  Substance Use Topics  . Alcohol use: No  . Drug use: Never    Types: Hydromorphone     Allergies   Patient has no known allergies.   Review of Systems Review of Systems  Constitutional: Negative for fever.  Musculoskeletal: Positive for arthralgias and joint swelling.  Skin: Negative for wound.     Physical Exam Updated Vital Signs BP (!) 148/97 (BP Location: Left Arm)   Pulse 83   Temp 98.3 F (36.8 C) (Oral)   Resp 18   Ht 5\' 4"  (1.626 m)   Wt (!) 154.2 kg   SpO2 100%   BMI 58.36 kg/m   Physical Exam Vitals signs and nursing note reviewed.  Constitutional:      General: She is not in acute distress.    Appearance: She is well-developed.  HENT:     Head: Normocephalic.  Eyes:     General: No scleral icterus.    Conjunctiva/sclera: Conjunctivae normal.  Neck:     Musculoskeletal: Normal range of motion.  Cardiovascular:     Rate and Rhythm: Normal rate.     Pulses:          Dorsalis pedis pulses are 2+ on the right side and 2+ on the left side.       Posterior tibial pulses are 2+ on the right side and 2+ on the left side.  Pulmonary:     Effort: Pulmonary effort is normal.  Musculoskeletal: Normal range of motion.     Comments: Left great toe with mild ecchymosis and swelling.  Almost full range of motion with pain.   Skin:    General: Skin is warm and dry.     Comments: No open wounds to the left great toe or foot  Neurological:     Mental Status: She is alert.     Comments: Sensation intact to the entire left foot.  Strength 5/5 with dorsiflexion and plantarflexion at the ankle and at the great toe.      ED Treatments / Results   Radiology Dg Foot Complete Left  Result Date: 01/08/2019 CLINICAL DATA:  Initial evaluation for acute left great toe pain status post injury. EXAM: LEFT FOOT - COMPLETE 3+ VIEW COMPARISON:  None. FINDINGS: No acute fracture or  dislocation. Mild scattered osteoarthritic changes noted about the foot, most notable at the left first MTP joint. Os navicularis noted. Prominent posterior plantar calcaneal enthesophytes. No visible soft tissue injury. IMPRESSION: No acute osseous abnormality about the left foot. Electronically Signed   By: Jeannine Boga M.D.   On: 01/08/2019 21:50    Procedures Procedures (including critical care time)  Medications Ordered in ED Medications  ibuprofen (ADVIL) tablet 800 mg (has no administration in time range)     Initial Impression / Assessment and Plan /  ED Course  I have reviewed the triage vital signs and the nursing notes.  Pertinent labs & imaging results that were available during my care of the patient were reviewed by me and considered in my medical decision making (see chart for details).        Patient presents with traumatic left great toe pain.  X-ray without evidence of fracture or acute abnormality.  I personally evaluated these images.  No open wounds.  Patient is noted to be hypertensive here in the emergency department but has history of same.  Pain treated.  Conservative therapies discussed.  Will follow with primary care if symptoms persist.  Patient in agreement with the plan.  Final Clinical Impressions(s) / ED Diagnoses   Final diagnoses:  Contusion of left great toe without damage to nail, initial encounter    ED Discharge Orders    None       Chadrick Sprinkle, Gwenlyn Perking 01/08/19 2256    Lajean Saver, MD 01/09/19 [phone]

## 2019-01-21 ENCOUNTER — Other Ambulatory Visit: Payer: Self-pay

## 2019-01-21 ENCOUNTER — Ambulatory Visit (INDEPENDENT_AMBULATORY_CARE_PROVIDER_SITE_OTHER): Payer: BC Managed Care – PPO | Admitting: Family Medicine

## 2019-01-21 ENCOUNTER — Encounter: Payer: Self-pay | Admitting: Family Medicine

## 2019-01-21 VITALS — BP 130/78 | HR 87 | Temp 97.7°F | Ht 65.0 in | Wt 355.0 lb

## 2019-01-21 DIAGNOSIS — M7581 Other shoulder lesions, right shoulder: Secondary | ICD-10-CM

## 2019-01-21 DIAGNOSIS — Z23 Encounter for immunization: Secondary | ICD-10-CM | POA: Diagnosis not present

## 2019-01-21 MED ORDER — TRIAMCINOLONE ACETONIDE 40 MG/ML IJ SUSP
40.0000 mg | Freq: Once | INTRAMUSCULAR | Status: AC
  Administered 2019-01-21: 40 mg via INTRA_ARTICULAR

## 2019-01-21 MED ORDER — TRAMADOL HCL 50 MG PO TABS: 50.0000 mg | ORAL_TABLET | Freq: Three times a day (TID) | ORAL | 0 refills | Status: AC | PRN

## 2019-01-21 NOTE — Patient Instructions (Addendum)
Please return in 6 weeks to recheck shoulder if not improved. I'll see you for your physical soon.  I've ordered Physical therapy for you. We will call you to get scheduled.  Today you were given your flu vaccination.   If you have any questions or concerns, please don't hesitate to send me a message via MyChart or call the office at [phone]. Thank you for visiting with Melissa Erickson today! It's our pleasure caring for you.  You had a steroid injection today.   Things to be aware of after this injection are listed below:  You may experience no significant improvement or even a slight worsening in your symptoms during the first 24 to 48 hours.  After that we expect your symptoms to improve gradually over the next 2 weeks for the medicine to have its maximal effect.  You should continue to have improvement out to 6 weeks after your injection.  I recommend icing the site of the injection for 20 minutes  1-2 times the day of your injection  You may shower but no swimming, tub bath or Jacuzzi for 24 hours.  If your bandage falls off this does not need to be replaced.  It is appropriate to remove the bandage after 4 hours.  You may resume light activities as tolerated.     POSSIBLE PROCEDURE SIDE EFFECTS: The side effects of the injection are usually fairly minimal however if you may experience some of the following side effects that are usually self-limited and will is off on their own.  If you are concerned please feel free to call the office with questions:             Increased numbness or tingling             Nausea or vomiting             Swelling or bruising at the injection site    Please call our office if if you experience any of the following symptoms over the next week as these can be signs of infection:              Fever greater than 100.2F             Significant swelling at the injection site             Significant redness or drainage from the injection site

## 2019-01-21 NOTE — Progress Notes (Signed)
Subjective  CC:  Chief Complaint  Patient presents with  . Shoulder Pain    Right Shoulder    HPI: Melissa Erickson is a 38 y.o. female who presents to the office today to address the problems listed above in the chief complaint.  2-4 week h/o right shoulder pain: started as dull ache w/o injury, overuse or trauma. Now progressive pain with mvts; hurts to lift arm. Has used nsaids w/o relief. Pain interferes with sleep now. No neck pain. No numbness or tingling. No weakness in hand but not using arm much due to pain.   Has CPE scheduled soon. Overdue.   HTN - resolved.    Assessment  1. Rotator cuff tendinitis, right      Plan   RCT:  RICE, s/p steroid injection today. Start PT and check xrays when she returns due to xray down today. Patient understands and agrees with care plan. Tramadol if needed for nighttime pain   Follow up: Return for as scheduled.  02/16/2019  Orders Placed This Encounter  Procedures  . DG Shoulder Right  . PT-HPC   Meds ordered this encounter  Medications  . traMADol (ULTRAM) 50 MG tablet    Sig: Take 1 tablet (50 mg total) by mouth every 8 (eight) hours as needed for up to 5 days.    Dispense:  15 tablet    Refill:  0  . triamcinolone acetonide (KENALOG-40) injection 40 mg      I reviewed the patients updated PMH, FH, and SocHx.    Patient Active Problem List   Diagnosis Date Noted  . Gastric bypass status for obesity 07/21/2017    Priority: High  . History of obstructive sleep apnea 08/08/2013    Priority: High  . Morbid obesity with BMI of 50.0-59.9, adult ([place]) 06/14/2012    Priority: High  . PCOS (polycystic ovarian syndrome) 07/21/2017    Priority: Medium  . Suppurative hidradenitis 07/21/2017    Priority: Medium  . NSTEMI (non-ST elevated myocardial infarction), type 2 secondary to demand ischemia sue to sepsis 07/20/2013    Priority: Medium  . History of ARDS 07/18/2013    Priority: Medium  . Diastolic dysfunction with  heart failure ([place]) 07/18/2013    Priority: Medium  . Asthma, mild persistent 03/11/2013    Priority: Medium  . Migraine 06/14/2012    Priority: Medium  . Seasonal allergic rhinitis due to pollen 06/21/2018  . Spondylosis of lumbar region without myelopathy or radiculopathy 07/21/2017  . Primary osteoarthritis of both hips 07/21/2017   Current Meds  Medication Sig  . [DISCONTINUED] albuterol (PROVENTIL HFA;VENTOLIN HFA) 108 (90 Base) MCG/ACT inhaler 2 puffs every 4-6 hours as needed for asthma/cough/wheeze  . [DISCONTINUED] cetirizine-pseudoephedrine (ZYRTEC-D) 5-120 MG tablet Take 1 tablet by mouth daily.  . [DISCONTINUED] Cholecalciferol (VITAMIN D) 2000 units tablet Take 2 tablets (4,000 Units total) by mouth daily.  . [DISCONTINUED] ibuprofen (ADVIL,MOTRIN) 600 MG tablet Take 1 tablet (600 mg total) by mouth every 6 (six) hours as needed.  . [DISCONTINUED] predniSONE (DELTASONE) 10 MG tablet Take 5 tablets (50 mg total) by mouth daily.  . [DISCONTINUED] sodium chloride (OCEAN) 0.65 % SOLN nasal spray Place 1 spray into both nostrils as needed for congestion.    Allergies: Patient has No Known Allergies. Family History: Patient family history includes Alcohol abuse in her father; Allergic rhinitis in her daughter; Alzheimer's disease in her maternal grandmother; COPD in her maternal grandfather; Cancer in her maternal grandfather, paternal grandfather, and paternal  grandmother; Cerebral aneurysm in her paternal grandmother; Diabetes in her maternal grandfather and mother; Early death in her father and paternal grandmother; Healthy in her brother; Heart attack in her maternal grandmother; Miscarriages / Stillbirths in her mother; Sarcoidosis in her father; Stroke in her mother. Social History:  Patient  reports that she has never smoked. She has never used smokeless tobacco. She reports that she does not drink alcohol or use drugs.  Review of Systems: Constitutional: Negative for fever  malaise or anorexia Cardiovascular: negative for chest pain Respiratory: negative for SOB or persistent cough Gastrointestinal: negative for abdominal pain  Objective  Vitals: BP 130/78 (BP Location: Left Arm, Patient Position: Sitting)   Pulse 87   Temp 97.7 F (36.5 C) (Temporal)   Ht 5\' 5"  (1.651 m)   Wt (!) 355 lb (161 kg)   SpO2 99%   BMI 59.08 kg/m  General: no acute distress , A&Ox3 Right shoulder: mild subacromial ttp. No ant ttp. Full passive and active abduction with pain. + empty can testing. Decreased int rotation due to pain. Neg impingement.   Shoulder Injection Procedure Note  Pre-operative Diagnosis: left RCT  Post-operative Diagnosis: same  Indications: Symptomatic relief and failed conservative measures  Anesthesia: Lidocaine 1% without epinephrine without added sodium bicarbonate  Procedure Details   Verbal consent was obtained for the procedure. The shoulder was prepped with alcohol  and the skin was anesthetized with cold spray. Using a 22 gauge needle the subacromial space was injected with 4 mL 1% lidocaine and 1 mL of triamcinolone (KENALOG) 40mg /ml under the posterior aspect of the acromion. The injection site was cleansed with topical isopropyl alcohol and a dressing was applied.  Complications:  None; patient tolerated the procedure well.     Commons side effects, risks, benefits, and alternatives for medications and treatment plan prescribed today were discussed, and the patient expressed understanding of the given instructions. Patient is instructed to call or message via MyChart if he/she has any questions or concerns regarding our treatment plan. No barriers to understanding were identified. We discussed Red Flag symptoms and signs in detail. Patient expressed understanding regarding what to do in case of urgent or emergency type symptoms.   Medication list was reconciled, printed and provided to the patient in AVS. Patient instructions and summary  information was reviewed with the patient as documented in the AVS. This note was prepared with assistance of Dragon voice recognition software. Occasional wrong-word or sound-a-like substitutions may have occurred due to the inherent limitations of voice recognition software  This visit occurred during the SARS-CoV-2 public health emergency.  Safety protocols were in place, including screening questions prior to the visit, additional usage of staff PPE, and extensive cleaning of exam room while observing appropriate contact time as indicated for disinfecting solutions.

## 2019-02-16 ENCOUNTER — Encounter: Payer: BC Managed Care – PPO | Admitting: Family Medicine

## 2019-02-23 ENCOUNTER — Encounter: Payer: Self-pay | Admitting: Family Medicine

## 2019-02-23 ENCOUNTER — Ambulatory Visit (INDEPENDENT_AMBULATORY_CARE_PROVIDER_SITE_OTHER): Payer: BC Managed Care – PPO | Admitting: Family Medicine

## 2019-02-23 ENCOUNTER — Other Ambulatory Visit: Payer: Self-pay

## 2019-02-23 VITALS — BP 118/68 | HR 87 | Temp 98.4°F | Ht 65.0 in | Wt 214.4 lb

## 2019-02-23 DIAGNOSIS — I5189 Other ill-defined heart diseases: Secondary | ICD-10-CM

## 2019-02-23 DIAGNOSIS — M7581 Other shoulder lesions, right shoulder: Secondary | ICD-10-CM

## 2019-02-23 DIAGNOSIS — Z6841 Body Mass Index (BMI) 40.0 and over, adult: Secondary | ICD-10-CM

## 2019-02-23 DIAGNOSIS — Z0000 Encounter for general adult medical examination without abnormal findings: Secondary | ICD-10-CM

## 2019-02-23 DIAGNOSIS — E282 Polycystic ovarian syndrome: Secondary | ICD-10-CM

## 2019-02-23 DIAGNOSIS — J4520 Mild intermittent asthma, uncomplicated: Secondary | ICD-10-CM | POA: Diagnosis not present

## 2019-02-23 DIAGNOSIS — Z9884 Bariatric surgery status: Secondary | ICD-10-CM | POA: Diagnosis not present

## 2019-02-23 DIAGNOSIS — G43009 Migraine without aura, not intractable, without status migrainosus: Secondary | ICD-10-CM

## 2019-02-23 LAB — COMPREHENSIVE METABOLIC PANEL
ALT: 14 U/L (ref 0–35)
AST: 13 U/L (ref 0–37)
Albumin: 3.8 g/dL (ref 3.5–5.2)
Alkaline Phosphatase: 61 U/L (ref 39–117)
BUN: 12 mg/dL (ref 6–23)
CO2: 26 mEq/L (ref 19–32)
Calcium: 9.1 mg/dL (ref 8.4–10.5)
Chloride: 103 mEq/L (ref 96–112)
Creatinine, Ser: 0.92 mg/dL (ref 0.40–1.20)
GFR: 81.76 mL/min (ref >60.00)
Glucose, Bld: 83 mg/dL (ref 70–99)
Potassium: 4.1 mEq/L (ref 3.5–5.1)
Sodium: 137 mEq/L (ref 135–145)
Total Bilirubin: 0.3 mg/dL (ref 0.2–1.2)
Total Protein: 7.3 g/dL (ref 6.0–8.3)

## 2019-02-23 LAB — CBC WITH DIFFERENTIAL/PLATELET
Basophils Absolute: 0.1 10*3/uL (ref 0.0–0.1)
Basophils Relative: 0.8 % (ref 0.0–3.0)
Eosinophils Absolute: 0.1 10*3/uL (ref 0.0–0.7)
Eosinophils Relative: 0.8 % (ref 0.0–5.0)
HCT: 38.5 % (ref 36.0–46.0)
Hemoglobin: 12.1 g/dL (ref 12.0–15.0)
Lymphocytes Relative: 28.6 % (ref 12.0–46.0)
Lymphs Abs: 1.9 10*3/uL (ref 0.7–4.0)
MCHC: 31.3 g/dL (ref 30.0–36.0)
MCV: 80.4 fl (ref 78.0–100.0)
Monocytes Absolute: 0.5 10*3/uL (ref 0.1–1.0)
Monocytes Relative: 7.8 % (ref 3.0–12.0)
Neutro Abs: 4.1 10*3/uL (ref 1.4–7.7)
Neutrophils Relative %: 62 % (ref 43.0–77.0)
Platelets: 250 10*3/uL (ref 150.0–400.0)
RBC: 4.79 Mil/uL (ref 3.87–5.11)
RDW: 15.7 % — ABNORMAL HIGH (ref 11.5–15.5)
WBC: 6.5 10*3/uL (ref 4.0–10.5)

## 2019-02-23 LAB — LIPID PANEL
Cholesterol: 213 mg/dL — ABNORMAL HIGH (ref 0–200)
HDL: 41.3 mg/dL (ref >39.00)
LDL Cholesterol: 154 mg/dL — ABNORMAL HIGH (ref 0–99)
NonHDL: 171.24
Total CHOL/HDL Ratio: 5
Triglycerides: 88 mg/dL (ref 0.0–149.0)
VLDL: 17.6 mg/dL (ref 0.0–40.0)

## 2019-02-23 LAB — VITAMIN D 25 HYDROXY (VIT D DEFICIENCY, FRACTURES): VITD: 14.87 ng/mL — ABNORMAL LOW (ref 30.00–100.00)

## 2019-02-23 LAB — HEMOGLOBIN A1C: Hgb A1c MFr Bld: 5.7 % (ref 4.6–6.5)

## 2019-02-23 LAB — TSH: TSH: 1.18 u[IU]/mL (ref 0.35–4.50)

## 2019-02-23 LAB — VITAMIN B12: Vitamin B-12: 404 pg/mL (ref 211–911)

## 2019-02-23 MED ORDER — SUMATRIPTAN SUCCINATE 25 MG PO TABS: 25.0000 mg | ORAL_TABLET | Freq: Once | ORAL | 2 refills | Status: DC | PRN

## 2019-02-23 MED ORDER — ALBUTEROL SULFATE HFA 108 (90 BASE) MCG/ACT IN AERS: 2.0000 | INHALATION_SPRAY | RESPIRATORY_TRACT | 2 refills | Status: DC | PRN

## 2019-02-23 NOTE — Patient Instructions (Signed)
Please return in 6-8 weeks to recheck migraines.   I will release your lab results to you on your MyChart account with further instructions. Please reply with any questions.   We will call you with information regarding your referral appointment. Sports medicine.  If you do not hear from [place] within the next 2 weeks, please let me know. It can take 1-2 weeks to get appointments set up with the specialists.    If you have any questions or concerns, please don't hesitate to send me a message via MyChart or call the office at [phone]. Thank you for visiting with [place] today! It's our pleasure caring for you.   Migraine Headache A migraine headache is an intense, throbbing pain on one side or both sides of the head. Migraine headaches may also cause other symptoms, such as nausea, vomiting, and sensitivity to light and noise. A migraine headache can last from 4 hours to 3 days. Talk with your doctor about what things may bring on (trigger) your migraine headaches. What are the causes? The exact cause of this condition is not known. However, a migraine may be caused when nerves in the brain become irritated and release chemicals that cause inflammation of blood vessels. This inflammation causes pain. This condition may be triggered or caused by:  Drinking alcohol.  Smoking.  Taking medicines, such as: ? Medicine used to treat chest pain (nitroglycerin). ? Birth control pills. ? Estrogen. ? Certain blood pressure medicines.  Eating or drinking products that contain nitrates, glutamate, aspartame, or tyramine. Aged cheeses, chocolate, or caffeine may also be triggers.  Doing physical activity. Other things that may trigger a migraine headache include:  Menstruation.  Pregnancy.  Hunger.  Stress.  Lack of sleep or too much sleep.  Weather changes.  Fatigue. What increases the risk? The following factors may make you more likely to experience migraine headaches:  Being a certain  age. This condition is more common in people who are 41-81 years old.  Being female.  Having a family history of migraine headaches.  Being Caucasian.  Having a mental health condition, such as depression or anxiety.  Being obese. What are the signs or symptoms? The main symptom of this condition is pulsating or throbbing pain. This pain may:  Happen in any area of the head, such as on one side or both sides.  Interfere with daily activities.  Get worse with physical activity.  Get worse with exposure to bright lights or loud noises. Other symptoms may include:  Nausea.  Vomiting.  Dizziness.  General sensitivity to bright lights, loud noises, or smells. Before you get a migraine headache, you may get warning signs (an aura). An aura may include:  Seeing flashing lights or having blind spots.  Seeing bright spots, halos, or zigzag lines.  Having tunnel vision or blurred vision.  Having numbness or a tingling feeling.  Having trouble talking.  Having muscle weakness. Some people have symptoms after a migraine headache (postdromal phase), such as:  Feeling tired.  Difficulty concentrating. How is this diagnosed? A migraine headache can be diagnosed based on:  Your symptoms.  A physical exam.  Tests, such as: ? CT scan or an MRI of the head. These imaging tests can help rule out other causes of headaches. ? Taking fluid from the spine (lumbar puncture) and analyzing it (cerebrospinal fluid analysis, or CSF analysis). How is this treated? This condition may be treated with medicines that:  Relieve pain.  Relieve nausea.  Prevent  migraine headaches. Treatment for this condition may also include:  Acupuncture.  Lifestyle changes like avoiding foods that trigger migraine headaches.  Biofeedback.  Cognitive behavioral therapy. Follow these instructions at home: Medicines  Take over-the-counter and prescription medicines only as told by your health  care provider.  Ask your health care provider if the medicine prescribed to you: ? Requires you to avoid driving or using heavy machinery. ? Can cause constipation. You may need to take these actions to prevent or treat constipation:  Drink enough fluid to keep your urine pale yellow.  Take over-the-counter or prescription medicines.  Eat foods that are high in fiber, such as beans, whole grains, and fresh fruits and vegetables.  Limit foods that are high in fat and processed sugars, such as fried or sweet foods. Lifestyle  Do not drink alcohol.  Do not use any products that contain nicotine or tobacco, such as cigarettes, e-cigarettes, and chewing tobacco. If you need help quitting, ask your health care provider.  Get at least 8 hours of sleep every night.  Find ways to manage stress, such as meditation, deep breathing, or yoga. General instructions      Keep a journal to find out what may trigger your migraine headaches. For example, write down: ? What you eat and drink. ? How much sleep you get. ? Any change to your diet or medicines.  If you have a migraine headache: ? Avoid things that make your symptoms worse, such as bright lights. ? It may help to lie down in a dark, quiet room. ? Do not drive or use heavy machinery. ? Ask your health care provider what activities are safe for you while you are experiencing symptoms.  Keep all follow-up visits as told by your health care provider. This is important. Contact a health care provider if:  You develop symptoms that are different or more severe than your usual migraine headache symptoms.  You have more than 15 headache days in one month. Get help right away if:  Your migraine headache becomes severe.  Your migraine headache lasts longer than 72 hours.  You have a fever.  You have a stiff neck.  You have vision loss.  Your muscles feel weak or like you cannot control them.  You start to lose your balance  often.  You have trouble walking.  You faint.  You have a seizure. Summary  A migraine headache is an intense, throbbing pain on one side or both sides of the head. Migraines may also cause other symptoms, such as nausea, vomiting, and sensitivity to light and noise.  This condition may be treated with medicines and lifestyle changes. You may also need to avoid certain things that trigger a migraine headache.  Keep a journal to find out what may trigger your migraine headaches.  Contact your health care provider if you have more than 15 headache days in a month or you develop symptoms that are different or more severe than your usual migraine headache symptoms. This information is not intended to replace advice given to you by your health care provider. Make sure you discuss any questions you have with your health care provider. Document Revised: 05/28/2018 Document Reviewed: 03/18/2018 [employer] Patient Education  [place].

## 2019-02-23 NOTE — Progress Notes (Signed)
Subjective  Chief Complaint  Patient presents with  . Annual Exam    HPI: Melissa Erickson is a 39 y.o. female who presents to [facility] [place] today for a Female Wellness Visit.  She also has the concerns and/or needs as listed above in the chief complaint. These will be addressed in addition to the Health Maintenance Visit.   Wellness Visit: annual visit with health maintenance review and exam without Pap   HM: Cervical cancer screening is up-to-date.  Morbid obesity status post gastric bypass but has regained her weight.  Due for recheck for vitamin deficiencies.  Not currently taking any vitamins or medications.  She overall feels well.  Working full-time.  No mood problems Chronic disease management visit and/or acute problem visit:  Right shoulder pain status post her injection last month with only short-lived benefit.  Still with daily pain.  Cannot get off work to go to physical therapy.  No new symptoms.  No neck pain no radicular symptoms.  History of persistent asthma but now off all medications for over a year.  Denies wheezing chest tightness or shortness of breath.  Does get wheezing with bronchitis.  Is out of albuterol.  History of diastolic dysfunction without lower extremity edema or chest pain.  Recent evaluation by otolaryngology reviewed.  Reviewed CT scan.  Sinus pain thought to be most consistent with recurrent migraines.  Patient was started on nortriptyline and took it for about a month with no change in symptoms.  Has never been treated for migraines.  She reports headaches about twice monthly.  NSAIDs with mild relief.  No diplopia or neurologic dysfunction.  No history of migraine diagnosis.  PCOS and obesity: No symptoms of hyperglycemia.  Normotensive Assessment  1. Annual physical exam   2. Gastric bypass status for obesity   3. Morbid obesity with BMI of 50.0-59.9, adult ([place])   4. Mild intermittent asthma without complication   5.  Diastolic dysfunction without heart failure   6. Rotator cuff tendinitis, right   7. Migraine without aura and without status migrainosus, not intractable   8. PCOS (polycystic ovarian syndrome)      Plan  Female Wellness Visit:  Age appropriate Health Maintenance and Prevention measures were discussed with patient. Included topics are cancer screening recommendations, ways to keep healthy (see AVS) including dietary and exercise recommendations, regular eye and dental care, use of seat belts, and avoidance of moderate alcohol use and tobacco use.   BMI: discussed patient's BMI and encouraged positive lifestyle modifications to help get to or maintain a target BMI.  HM needs and immunizations were addressed and ordered. See below for orders. See HM and immunization section for updates.  Routine labs and screening tests ordered including cmp, cbc and lipids where appropriate.  Discussed recommendations regarding Vit D and calcium supplementation (see AVS)  Chronic disease f/u and/or acute problem visit: (deemed necessary to be done in addition to the wellness visit):  Migraine: This is highly possible.  Start Imitrex trial for abortive headaches.  Education given.  Recommend recheck in 6 to 8 weeks  Morbid obesity: Check lipids, thyroid and A1c.  At risk for diabetes given PCOS history as well.  Right shoulder pain: Refer to sports medicine to see if they have something else they can offer her.  Asthma: Now more consistent with mild intermittent.  Refilled albuterol inhaler to have on hand.  Gastric bypass status: Check for vitamin deficiencies.  Follow up: 6 to  8 weeks for follow-up of migraines  Orders Placed This Encounter  Procedures  . CBC w/Diff  . CMP  . Lipids  . TSH  . Vitamin B12  . Vit D 25OH  . HgB A1c  . Ambulatory referral to Sports Medicine   Meds ordered this encounter  Medications  . albuterol (VENTOLIN HFA) 108 (90 Base) MCG/ACT inhaler    Sig: Inhale 2  puffs into the lungs every 4 (four) hours as needed for wheezing or shortness of breath.    Dispense:  18 g    Refill:  2  . SUMAtriptan (IMITREX) 25 MG tablet    Sig: Take 1 tablet (25 mg total) by mouth once as needed for up to 1 dose for migraine. May repeat in 2 hours if headache persists or recurs.    Dispense:  10 tablet    Refill:  2      Lifestyle: Body mass index is 35.68 kg/m. Wt Readings from Last 3 Encounters:  02/23/19 214 lb 6.4 oz (97.3 kg)  01/21/19 (!) 355 lb (161 kg)  01/08/19 (!) 340 lb (154.2 kg)   Diet: general Exercise: never,    Patient Active Problem List   Diagnosis Date Noted  . Gastric bypass status for obesity 07/21/2017    Priority: High  . History of obstructive sleep apnea 08/08/2013    Priority: High  . Morbid obesity with BMI of 50.0-59.9, adult ([place]) 06/14/2012    Priority: High  . PCOS (polycystic ovarian syndrome) 07/21/2017    Priority: Medium  . Suppurative hidradenitis 07/21/2017    Priority: Medium  . Spondylosis of lumbar region without myelopathy or radiculopathy 07/21/2017    Priority: Medium  . Primary osteoarthritis of both hips 07/21/2017    Priority: Medium  . NSTEMI (non-ST elevated myocardial infarction), type 2 secondary to demand ischemia sue to sepsis 07/20/2013    Priority: Medium  . History of ARDS 07/18/2013    Priority: Medium    Overview:  ARDS requiring intubation after gastric sleeve bypass 06/2013  ARDS requiring intubation after gastric sleeve bypass 06/2013   . Diastolic dysfunction without heart failure 07/18/2013    Priority: Medium    Overview:  Echo EF 55%  Echo EF 55%   . Asthma, mild intermittent 03/11/2013    Priority: Medium    Started Advair 250/500 02/2013   . Migraine 06/14/2012    Priority: Medium    Last Assessment & Plan:  Concern over persistent chronic sinus pain. For at least 3 years she is been getting shooting facial pain starting in the left cheek going up into the head at  least 3 times a week.  Symptoms will sometimes last most of the day.  Denies significant nasal obstruction.  Typically resolves all treatment.  At one point in time she thought it may be related to toothache and had a tooth pulled but her symptoms persisted.  Recent dental evaluation was unremarkable except she was told there was something going on in the left cheek sinus.  Looking back through her chart, she had a CT done in the past that showed a mucous retention cyst in the left cheek sinus.  She also complains of seasonal allergic eye symptoms.  Over-the-counter medication has not helped this. EXAMINATION reveals no intranasal masses, polyps, purulence or obstructing anatomy.  Dentition looks good. PLAN: In the absence of nasal obstruction, I am thinking her facial pain is probably not sinus related.  We discussed putting her on a preventative migraine  medication and keeping in touch through the portal regarding her response to therapy.  We will also give her an allergy eyedrop.   . Seasonal allergic rhinitis due to pollen 06/21/2018    Priority: Low   Health Maintenance  Topic Date Due  . PAP SMEAR-Modifier  07/24/2022  . TETANUS/TDAP  09/13/2026  . INFLUENZA VACCINE  Completed  . HIV Screening  Completed   Immunization History  Administered Date(s) Administered  . Influenza,inj,Quad PF,6+ Mos 01/21/2019  . PPD Test 08/22/2018  . Pneumococcal Polysaccharide-23 04/01/2013  . Tdap 09/12/2016   We updated and reviewed the patient's past history in detail and it is documented below. Allergies: Patient  reports no history of alcohol use. Past Medical History Patient  has a past medical history of Allergy, Asthma, Chicken pox, Environmental allergies, Essential hypertension (07/18/2013), Gallstones, Heart attack ([place]), Migraine, Polycystic ovarian syndrome, Suppurative hidradenitis (07/21/2017), and Ventral hernia. Past Surgical History Patient  has a past surgical history that includes  Cholecystectomy; Laparoscopic gastric sleeve resection; laparoscopy (N/A, 07/19/2013); Esophagogastroduodenoscopy (N/A, 07/19/2013); Cesarean section (2007); and Breast biopsy (Right, 2001). Social History   Socioeconomic History  . Marital status: Single    Spouse name: Not on file  . Number of children: Not on file  . Years of education: Not on file  . Highest education level: Not on file  Occupational History  . Occupation: Human resources officer, call center positions    Employer: [employer]    Comment: [employer]  Tobacco Use  . Smoking status: Never Smoker  . Smokeless tobacco: Never Used  Substance and Sexual Activity  . Alcohol use: No  . Drug use: Never    Types: Hydromorphone  . Sexual activity: Yes    Birth control/protection: None  Other Topics Concern  . Not on file  Social History Narrative   Lives with long term boyfriend and daughter.   Social Determinants of Health   Financial Resource Strain:   . Difficulty of Paying Living Expenses: Not on file  Food Insecurity:   . Worried About Charity fundraiser in the Last Year: Not on file  . Ran Out of Food in the Last Year: Not on file  Transportation Needs:   . Lack of Transportation (Medical): Not on file  . Lack of Transportation (Non-Medical): Not on file  Physical Activity:   . Days of Exercise per Week: Not on file  . Minutes of Exercise per Session: Not on file  Stress:   . Feeling of Stress : Not on file  Social Connections:   . Frequency of Communication with Friends and Family: Not on file  . Frequency of Social Gatherings with Friends and Family: Not on file  . Attends Religious Services: Not on file  . Active Member of Clubs or Organizations: Not on file  . Attends Archivist Meetings: Not on file  . Marital Status: Not on file   Family History  Problem Relation Age of Onset  . Diabetes Mother   . Stroke Mother   . Miscarriages / [place] Mother   . Sarcoidosis Father   . Alcohol abuse  Father   . Early death Father   . Healthy Brother   . Alzheimer's disease Maternal Grandmother   . Heart attack Maternal Grandmother   . Diabetes Maternal Grandfather   . COPD Maternal Grandfather   . Cancer Maternal Grandfather   . Cancer Paternal Grandmother   . Cerebral aneurysm Paternal Grandmother   . Early death Paternal Grandmother   .  Cancer Paternal Grandfather   . Allergic rhinitis Daughter     Review of Systems: Constitutional: negative for fever or malaise Ophthalmic: negative for photophobia, double vision or loss of vision Cardiovascular: negative for chest pain, dyspnea on exertion, or new LE swelling Respiratory: negative for SOB or persistent cough Gastrointestinal: negative for abdominal pain, change in bowel habits or melena Genitourinary: negative for dysuria or gross hematuria, no abnormal uterine bleeding or disharge Musculoskeletal: negative for new gait disturbance or muscular weakness Integumentary: negative for new or persistent rashes, no breast lumps Neurological: negative for TIA or stroke symptoms Psychiatric: negative for SI or delusions Allergic/Immunologic: negative for hives  Patient Care Team    Relationship Specialty Notifications Start End  Leamon Arnt, MD PCP - General Family Medicine  07/21/17   Sanjuana Kava, MD Referring Physician Obstetrics and Gynecology  07/21/17     Objective  Vitals: BP 118/68 (BP Location: Right Arm, Patient Position: Sitting, Cuff Size: Large)   Pulse 87   Temp 98.4 F (36.9 C) (Temporal)   Ht 5\' 5"  (1.651 m)   Wt 214 lb 6.4 oz (97.3 kg)   SpO2 98%   BMI 35.68 kg/m  General:  Well developed, morbidly obese, no acute distress  Psych:  Alert and orientedx3,normal mood and affect HEENT:  Normocephalic, atraumatic, non-icteric sclera,  supple neck without adenopathy, mass or thyromegaly, hirsutism present tension Cardiovascular:  Normal S1, S2, RRR without gallop, rub or murmur, nondisplaced PMI Respiratory:   Good breath sounds bilaterally, CTAB with normal respiratory effort Gastrointestinal: normal bowel sounds, soft, non-tender, no noted masses. No HSM MSK: no deformities, contusions. Skin:  Warm, no rashes or suspicious lesions noted Neurologic:    Mental status is normal. Gross motor and sensory exams are normal. Normal gait. No tremor Breast Exam: No mass, skin retraction or nipple discharge is appreciated in either breast. No axillary adenopathy. Fibrocystic changes are not noted     Commons side effects, risks, benefits, and alternatives for medications and treatment plan prescribed today were discussed, and the patient expressed understanding of the given instructions. Patient is instructed to call or message via MyChart if he/she has any questions or concerns regarding our treatment plan. No barriers to understanding were identified. We discussed Red Flag symptoms and signs in detail. Patient expressed understanding regarding what to do in case of urgent or emergency type symptoms.   Medication list was reconciled, printed and provided to the patient in AVS. Patient instructions and summary information was reviewed with the patient as documented in the AVS. This note was prepared with assistance of Dragon voice recognition software. Occasional wrong-word or sound-a-like substitutions may have occurred due to the inherent limitations of voice recognition software  This visit occurred during the SARS-CoV-2 public health emergency.  Safety protocols were in place, including screening questions prior to the visit, additional usage of staff PPE, and extensive cleaning of exam room while observing appropriate contact time as indicated for disinfecting solutions.

## 2019-02-24 MED ORDER — VITAMIN D (ERGOCALCIFEROL) 1.25 MG (50000 UNIT) PO CAPS: 50000.0000 [IU] | ORAL_CAPSULE | ORAL | 0 refills | Status: AC

## 2019-02-24 NOTE — Addendum Note (Signed)
Addended by: Billey Chang on: 02/24/2019 10:24 AM   Modules accepted: Orders

## 2019-02-25 ENCOUNTER — Telehealth: Payer: Self-pay

## 2019-02-25 NOTE — Telephone Encounter (Signed)
Patient was seen on 02/23/19 patient stated the prescription that was provided for migraine is not working for her after taking medication the headache went away instantly but pt had side effects such as hot flashes and chest pain. Pt would like to try something else for the migraines

## 2019-03-01 NOTE — Telephone Encounter (Signed)
Error

## 2019-03-01 NOTE — Telephone Encounter (Signed)
error 

## 2019-03-21 ENCOUNTER — Ambulatory Visit (INDEPENDENT_AMBULATORY_CARE_PROVIDER_SITE_OTHER): Payer: BC Managed Care – PPO | Admitting: Otolaryngology

## 2019-03-21 ENCOUNTER — Other Ambulatory Visit: Payer: Self-pay

## 2019-03-21 ENCOUNTER — Encounter (INDEPENDENT_AMBULATORY_CARE_PROVIDER_SITE_OTHER): Payer: Self-pay | Admitting: Otolaryngology

## 2019-03-21 VITALS — Temp 97.7°F

## 2019-03-21 DIAGNOSIS — J341 Cyst and mucocele of nose and nasal sinus: Secondary | ICD-10-CM | POA: Diagnosis not present

## 2019-03-21 DIAGNOSIS — J310 Chronic rhinitis: Secondary | ICD-10-CM

## 2019-03-21 MED ORDER — TRIAMCINOLONE ACETONIDE 55 MCG/ACT NA AERO: 2.0000 | INHALATION_SPRAY | Freq: Every day | NASAL | 12 refills | Status: DC

## 2019-03-21 NOTE — Progress Notes (Signed)
HPI: Melissa Erickson is a 39 y.o. female who presents is referred by her PCP for evaluation of left maxillary sinus retention cyst.  Patient apparently has had nasal sinus issues for a while.  She was recently seen by her dentist who performed an x-ray that demonstrated a benign-appearing left maxillary sinus retention cyst along the floor of the left maxillary sinus.  Patient does notice some mild discomfort in the left cheek area sometimes.  Denies any yellow-green discharge from her nose. She has had headaches in the past but has been diagnosed with migraine type headaches..  Past Medical History:  Diagnosis Date  . Allergy   . Asthma   . Chicken pox   . Environmental allergies    Seasonal  . Essential hypertension 07/18/2013  . Gallstones   . Heart attack ([place])    2015  . Migraine   . Polycystic ovarian syndrome   . Suppurative hidradenitis 07/21/2017  . Ventral hernia    Past Surgical History:  Procedure Laterality Date  . BREAST BIOPSY Right 2001   Normal  . CESAREAN SECTION  2007  . CHOLECYSTECTOMY    . ESOPHAGOGASTRODUODENOSCOPY N/A 07/19/2013   Procedure: ESOPHAGOGASTRODUODENOSCOPY (EGD);  Surgeon: Harl Bowie, MD;  Location: [facility];  Service: General;  Laterality: N/A;  . LAPAROSCOPIC GASTRIC SLEEVE RESECTION    . LAPAROSCOPY N/A 07/19/2013   Procedure: LAPAROSCOPY DIAGNOSTIC;  Surgeon: Harl Bowie, MD;  Location: [facility];  Service: General;  Laterality: N/A;   Social History   Socioeconomic History  . Marital status: Single    Spouse name: Not on file  . Number of children: Not on file  . Years of education: Not on file  . Highest education level: Not on file  Occupational History  . Occupation: Human resources officer, call center positions    Employer: [employer]    Comment: [employer]  Tobacco Use  . Smoking status: Never Smoker  . Smokeless tobacco: Never Used  Substance and Sexual Activity  . Alcohol use: No  . Drug use: Never    Types: Hydromorphone  .  Sexual activity: Yes    Birth control/protection: None  Other Topics Concern  . Not on file  Social History Narrative   Lives with long term boyfriend and daughter.   Social Determinants of Health   Financial Resource Strain:   . Difficulty of Paying Living Expenses: Not on file  Food Insecurity:   . Worried About Charity fundraiser in the Last Year: Not on file  . Ran Out of Food in the Last Year: Not on file  Transportation Needs:   . Lack of Transportation (Medical): Not on file  . Lack of Transportation (Non-Medical): Not on file  Physical Activity:   . Days of Exercise per Week: Not on file  . Minutes of Exercise per Session: Not on file  Stress:   . Feeling of Stress : Not on file  Social Connections:   . Frequency of Communication with Friends and Family: Not on file  . Frequency of Social Gatherings with Friends and Family: Not on file  . Attends Religious Services: Not on file  . Active Member of Clubs or Organizations: Not on file  . Attends Archivist Meetings: Not on file  . Marital Status: Not on file   Family History  Problem Relation Age of Onset  . Diabetes Mother   . Stroke Mother   . Miscarriages / [place] Mother   . Sarcoidosis Father   .  Alcohol abuse Father   . Early death Father   . Healthy Brother   . Alzheimer's disease Maternal Grandmother   . Heart attack Maternal Grandmother   . Diabetes Maternal Grandfather   . COPD Maternal Grandfather   . Cancer Maternal Grandfather   . Cancer Paternal Grandmother   . Cerebral aneurysm Paternal Grandmother   . Early death Paternal Grandmother   . Cancer Paternal Grandfather   . Allergic rhinitis Daughter    No Known Allergies Prior to Admission medications   Medication Sig Start Date End Date Taking? Authorizing Provider  albuterol (VENTOLIN HFA) 108 (90 Base) MCG/ACT inhaler Inhale 2 puffs into the lungs every 4 (four) hours as needed for wheezing or shortness of breath. 02/23/19  Yes  Leamon Arnt, MD  SUMAtriptan (IMITREX) 25 MG tablet Take 1 tablet (25 mg total) by mouth once as needed for up to 1 dose for migraine. May repeat in 2 hours if headache persists or recurs. 02/23/19  Yes Leamon Arnt, MD  Vitamin D, Ergocalciferol, (DRISDOL) 1.25 MG (50000 UT) CAPS capsule Take 1 capsule (50,000 Units total) by mouth once a week. 02/24/19 05/25/19 Yes Leamon Arnt, MD     Positive ROS: Otherwise negative  All other systems have been reviewed and were otherwise negative with the exception of those mentioned in the HPI and as above.  Physical Exam: Constitutional: Alert, well-appearing, no acute distress Ears: External ears without lesions or tenderness. Ear canals are clear bilaterally with intact, clear TMs bilaterally. Nasal: External nose without lesions. Septum with mild deformity..  Moderate rhinitis bilaterally.  After decongesting the nose with Afrin nasal endoscopy was performed bilaterally.  Both middle meatus regions were clear.  No polyps noted.  No mucopurulent discharge noted from the maxillary ostia.  Posterior nasal cavity was clear as was the nasopharynx. Oral: Lips and gums without lesions. Tongue and palate mucosa without lesions. Posterior oropharynx clear. Neck: No palpable adenopathy or masses Respiratory: Breathing comfortably  Skin: No facial/neck lesions or rash noted.  Nasal/sinus endoscopy  Date/Time: 03/21/2019 6:08 PM Performed by: Rozetta Nunnery, MD Authorized by: Rozetta Nunnery, MD   Consent:    Consent obtained:  Verbal   Consent given by:  Patient   Risks discussed:  Pain Procedure details:    Indications: sino-nasal symptoms     Scope location: bilateral nare   Sinus:    Right middle meatus: normal     Left middle meatus: normal     Right nasopharynx: normal     Left nasopharynx: normal   Comments:     Patient has a mild to moderate rhinitis.  But both middle meatus regions were clear with no evidence of active  infection.  No intranasal polyps noted.     Assessment: Benign left maxillary sinus retention cyst which is nonobstructing. Chronic rhinitis  Plan: Recommended regular use of nasal steroid spray such as Nasacort 2 sprays each nostril at night.  This should help with nasal congestion and sinus pressure. If she continues to have problems with pressure or pain in the sinuses she will call us back in 1 month and will plan on obtaining CT scan of the sinuses.   Radene Journey, MD   CC:

## 2019-03-21 NOTE — Addendum Note (Signed)
Addended by: Rozetta Nunnery on: 03/21/2019 06:13 PM   Modules accepted: Orders

## 2019-07-19 ENCOUNTER — Other Ambulatory Visit: Payer: Self-pay | Admitting: Obstetrics & Gynecology

## 2019-07-23 ENCOUNTER — Other Ambulatory Visit (HOSPITAL_COMMUNITY)
Admission: RE | Admit: 2019-07-23 | Discharge: 2019-07-23 | Disposition: A | Discharge status: Home / Self Care | Payer: BC Managed Care – PPO | Source: Ambulatory Visit | Attending: Obstetrics & Gynecology | Admitting: Obstetrics & Gynecology

## 2019-07-23 DIAGNOSIS — Z01812 Encounter for preprocedural laboratory examination: Secondary | ICD-10-CM | POA: Insufficient documentation

## 2019-07-23 DIAGNOSIS — Z20822 Contact with and (suspected) exposure to covid-19: Secondary | ICD-10-CM | POA: Insufficient documentation

## 2019-07-23 LAB — SARS CORONAVIRUS 2 (TAT 6-24 HRS): SARS Coronavirus 2: NEGATIVE

## 2019-07-25 ENCOUNTER — Encounter (HOSPITAL_COMMUNITY): Payer: Self-pay | Admitting: Obstetrics & Gynecology

## 2019-07-25 ENCOUNTER — Other Ambulatory Visit: Payer: Self-pay

## 2019-07-25 NOTE — Anesthesia Preprocedure Evaluation (Addendum)
Anesthesia Evaluation  Patient identified by MRN, date of birth, ID band Patient awake    Reviewed: Allergy & Precautions, NPO status , Patient's Chart, lab work & pertinent test results  Airway Mallampati: I       Dental no notable dental hx. (+) Teeth Intact   Pulmonary sleep apnea ,    Pulmonary exam normal breath sounds clear to auscultation       Cardiovascular hypertension, Normal cardiovascular exam Rhythm:Regular Rate:Normal     Neuro/Psych  Headaches,    GI/Hepatic negative GI ROS, Neg liver ROS,   Endo/Other  Morbid obesity  Renal/GU negative Renal ROS  negative genitourinary   Musculoskeletal   Abdominal (+) + obese,   Peds  Hematology negative hematology ROS (+)   Anesthesia Other Findings   Reproductive/Obstetrics                            Anesthesia Physical Anesthesia Plan  ASA: III  Anesthesia Plan: General   Post-op Pain Management:    Induction: Intravenous  PONV Risk Score and Plan: 4 or greater and Ondansetron, Dexamethasone and Midazolam  Airway Management Planned: Oral ETT  Additional Equipment: None  Intra-op Plan:   Post-operative Plan: Extubation in OR  Informed Consent: I have reviewed the patients History and Physical, chart, labs and discussed the procedure including the risks, benefits and alternatives for the proposed anesthesia with the patient or authorized representative who has indicated his/her understanding and acceptance.     Dental advisory given  Plan Discussed with: CRNA  Anesthesia Plan Comments: (PAT note written 07/25/2019 by Myra Gianotti, PA-C. )       Anesthesia Quick Evaluation

## 2019-07-25 NOTE — Progress Notes (Signed)
Patient denies chest pain or shortness of breath. Patient states in June 2015 she was told she had a heart attack but that it was due to pneumonia and did not need to follow-up with cardiology. Notes in Epic. Educated on Environmental manager. Patient states she has been in quarantine since COVID test. Chart referred to anesthesia PA for review.

## 2019-07-25 NOTE — Progress Notes (Signed)
Anesthesia Chart Review: Kathleene Hazel   Case: [id number] Date/Time: 07/26/19 0845   Procedure: TOTAL LAPAROSCOPIC HYSTERECTOMY WITH SALPINGECTOMY (Bilateral )   Anesthesia type: General   Pre-op diagnosis: Uterine Fibroids   Location: [facility] OR ROOM 07 / [place] [facility]   Surgeons: Sanjuana Kava, MD      DISCUSSION: Patient is a 39 year old female scheduled for the above procedure.  History includes never smoker, PCOS, asthma, migraines, ventral hernia, HTN, OSA, diastolic CHF (02/6071), NSTEMI (troponin 0.19->0.98-><0.30, from acute respiratory failure/ARDS; "No further cardiac workup necessary" at that time per cardiologist Candee Furbish, MD on 07/20/13), morbid obesity (s/p laparoscopic gastric sleeve resection 07/14/13, [name]), suppurative hidradenitis, hysteroscopy/D&C/excision of uterine septum (11/27/15, [facility]).   History of [place] admission 07/18/13-07/28/19 for acute respiratory failure (RA O2 sat 50's) 4 days post laparoscpic sleeve gastrectomy and required emergent intubation in the ED by anesthesiologist. Diagnosed with ARDS, diastolic CHF, type 2 NSTEMI (secondary to demand ischemia from respiratory failure/ARDS; "No further cardiac workup necessary" per cardiologist Candee Furbish, MD on 07/20/13). Diagnostic laparoscopy/EGD done on 07-23-2013 which ruled out leak from sleeve gastrectomy. Imaging did not show evidence of PE or DVT. Extubated 07/22/13. Encouraged CPAP compliance (reported CPAP intolerance). Discharged to CIR (07/27/13-/08/04/13) due to deconditioning.  Patient denied SOB and chest pain per PAT RN phone interview. 07/23/19 presurgical COVID-19 test negative. She is a same day work-up, so will need labs and EKG on the day of surgery. Reviewed history with anesthesiologist Oren Bracket, MD. Anesthesia team to evaluate prior to surgery.    VS: Ht 5\' 5"  (1.651 m)   Wt (!) 154.2 kg   LMP 07/25/2019   BMI 56.58 kg/m   Wt Readings from Last 3 Encounters:  02/23/19 97.3 kg  01/21/19  (!) 161 kg  01/08/19 (!) 154.2 kg   BP Readings from Last 3 Encounters:  02/23/19 118/68  01/21/19 130/78  01/08/19 (!) 133/93   Pulse Readings from Last 3 Encounters:  02/23/19 87  01/21/19 87  01/08/19 77    PROVIDERS: Leamon Arnt, MD is PCP [facility]). Last evaluation for annual exam was on 02/23/19.  - She is not followed routinely by cardiology, but was evaluated by Daneen Schick, MD and Candee Furbish, MD in 07/2013 as outlined above.    LABS: She is for labs on the day of surgery. As of 02/23/19, CMET was WNL, H/H 12.1/38.5, PLT 250K, A1c 5.7%, TSH 1.18.     EKG: Last EKG seen was from 08/23/16 and showed: Sinus rhythm Borderline short PR interval Low voltage, precordial leads Borderline repolarization abnormality No significant change since last tracing [07-23-13] Confirmed by Deno Etienne [phone]) on 08/23/2016 12:07:00 PM - (Tracing shows mild ST/T wave inversion in inferior and anterolateral leads V3-6, appears more pronounced when compared to 2013-07-23 tracing.)   CV: Echo 07/18/13: Study Conclusions  - Left ventricle: The cavity size was normal. Wall thickness was  increased in a pattern of mild LVH. Systolic function was normal.  The estimated ejection fraction was in the range of 50% to 55%.  Regional wall motion abnormalities cannot be excluded. Doppler  parameters are consistent with abnormal left ventricular  relaxation (grade 1 diastolic dysfunction).   (Comparison echo 6/94/85, [facility]: diastolic dysfunction, EF 46%)   Past Medical History:  Diagnosis Date  . Allergy   . Asthma   . Chicken pox   . Environmental allergies    Seasonal  . Essential hypertension 07/18/2013  . Gallstones   . Heart attack ([place])  NSTEMI 2015  . History of congestive heart failure 2015  . History of pneumonia   . Migraine   . Polycystic ovarian syndrome   . Sleep apnea   . Suppurative hidradenitis 07/21/2017  . Ventral hernia     Past Surgical History:   Procedure Laterality Date  . BREAST BIOPSY Right 2001   Normal  . CESAREAN SECTION  2007  . CHOLECYSTECTOMY    . ESOPHAGOGASTRODUODENOSCOPY N/A 07/19/2013   Procedure: ESOPHAGOGASTRODUODENOSCOPY (EGD);  Surgeon: Harl Bowie, MD;  Location: [place];  Service: General;  Laterality: N/A;  . LAPAROSCOPIC GASTRIC SLEEVE RESECTION    . LAPAROSCOPY N/A 07/19/2013   Procedure: LAPAROSCOPY DIAGNOSTIC;  Surgeon: Harl Bowie, MD;  Location: [place];  Service: General;  Laterality: N/A;    MEDICATIONS: No current facility-administered medications for this encounter.   Marland Kitchen albuterol (VENTOLIN HFA) 108 (90 Base) MCG/ACT inhaler  . ALPRAZolam (XANAX) 0.5 MG tablet  . Multiple Vitamins-Minerals (MULTIVITAMIN WITH MINERALS) tablet  . sertraline (ZOLOFT) 50 MG tablet  . SUMAtriptan (IMITREX) 25 MG tablet  . triamcinolone (NASACORT) 55 MCG/ACT AERO nasal inhaler     Myra Gianotti, PA-C Surgical Short Stay/Anesthesiology [facility] Phone [phone] [facility] Phone [phone] 07/25/2019 1:43 PM

## 2019-07-26 ENCOUNTER — Encounter (HOSPITAL_COMMUNITY)
Admission: RE | Disposition: A | Discharge status: Home / Self Care | Payer: Self-pay | Source: Home / Self Care | Attending: Obstetrics & Gynecology

## 2019-07-26 ENCOUNTER — Ambulatory Visit (HOSPITAL_COMMUNITY): Payer: BC Managed Care – PPO | Admitting: Vascular Surgery

## 2019-07-26 ENCOUNTER — Encounter (HOSPITAL_COMMUNITY): Payer: Self-pay | Admitting: Obstetrics & Gynecology

## 2019-07-26 ENCOUNTER — Inpatient Hospital Stay (HOSPITAL_COMMUNITY)
Admission: RE | Admit: 2019-07-26 | Discharge: 2019-07-28 | DRG: 742 | Disposition: A | Discharge status: Home / Self Care | Payer: BC Managed Care – PPO | Attending: Obstetrics & Gynecology | Admitting: Obstetrics & Gynecology

## 2019-07-26 DIAGNOSIS — D259 Leiomyoma of uterus, unspecified: Principal | ICD-10-CM | POA: Diagnosis present

## 2019-07-26 DIAGNOSIS — J45909 Unspecified asthma, uncomplicated: Secondary | ICD-10-CM | POA: Diagnosis present

## 2019-07-26 DIAGNOSIS — Z823 Family history of stroke: Secondary | ICD-10-CM

## 2019-07-26 DIAGNOSIS — Z6841 Body Mass Index (BMI) 40.0 and over, adult: Secondary | ICD-10-CM

## 2019-07-26 DIAGNOSIS — Z8249 Family history of ischemic heart disease and other diseases of the circulatory system: Secondary | ICD-10-CM

## 2019-07-26 DIAGNOSIS — Z833 Family history of diabetes mellitus: Secondary | ICD-10-CM | POA: Diagnosis not present

## 2019-07-26 DIAGNOSIS — G43909 Migraine, unspecified, not intractable, without status migrainosus: Secondary | ICD-10-CM | POA: Diagnosis present

## 2019-07-26 DIAGNOSIS — I252 Old myocardial infarction: Secondary | ICD-10-CM

## 2019-07-26 DIAGNOSIS — Z5331 Laparoscopic surgical procedure converted to open procedure: Secondary | ICD-10-CM

## 2019-07-26 DIAGNOSIS — N920 Excessive and frequent menstruation with regular cycle: Secondary | ICD-10-CM | POA: Diagnosis present

## 2019-07-26 DIAGNOSIS — N800 Endometriosis of uterus: Secondary | ICD-10-CM | POA: Diagnosis present

## 2019-07-26 DIAGNOSIS — Z20822 Contact with and (suspected) exposure to covid-19: Secondary | ICD-10-CM | POA: Diagnosis present

## 2019-07-26 DIAGNOSIS — Z9884 Bariatric surgery status: Secondary | ICD-10-CM | POA: Diagnosis not present

## 2019-07-26 DIAGNOSIS — Z90710 Acquired absence of both cervix and uterus: Secondary | ICD-10-CM

## 2019-07-26 HISTORY — DX: Leiomyoma of uterus, unspecified: D25.9

## 2019-07-26 HISTORY — DX: Personal history of pneumonia (recurrent): Z87.01

## 2019-07-26 HISTORY — PX: TOTAL LAPAROSCOPIC HYSTERECTOMY WITH SALPINGECTOMY: SHX6742

## 2019-07-26 HISTORY — PX: HYSTERECTOMY ABDOMINAL WITH SALPINGECTOMY: SHX6725

## 2019-07-26 HISTORY — DX: Sleep apnea, unspecified: G47.30

## 2019-07-26 LAB — CBC
HCT: 42 % (ref 36.0–46.0)
Hemoglobin: 12.5 g/dL (ref 12.0–15.0)
MCH: 25.1 pg — ABNORMAL LOW (ref 26.0–34.0)
MCHC: 29.8 g/dL — ABNORMAL LOW (ref 30.0–36.0)
MCV: 84.2 fL (ref 80.0–100.0)
Platelets: 243 10*3/uL (ref 150–400)
RBC: 4.99 MIL/uL (ref 3.87–5.11)
RDW: 15 % (ref 11.5–15.5)
WBC: 6.6 10*3/uL (ref 4.0–10.5)
nRBC: 0 % (ref 0.0–0.2)

## 2019-07-26 LAB — BASIC METABOLIC PANEL
Anion gap: 8 (ref 5–15)
BUN: 10 mg/dL (ref 6–20)
CO2: 24 mmol/L (ref 22–32)
Calcium: 8.7 mg/dL — ABNORMAL LOW (ref 8.9–10.3)
Chloride: 107 mmol/L (ref 98–111)
Creatinine, Ser: 0.78 mg/dL (ref 0.44–1.00)
GFR calc Af Amer: >60 mL/min (ref >60)
GFR calc non Af Amer: >60 mL/min (ref >60)
Glucose, Bld: 97 mg/dL (ref 70–99)
Potassium: 3.7 mmol/L (ref 3.5–5.1)
Sodium: 139 mmol/L (ref 135–145)

## 2019-07-26 LAB — TYPE AND SCREEN
ABO/RH(D): O POS
Antibody Screen: NEGATIVE

## 2019-07-26 LAB — ABO/RH: ABO/RH(D): O POS

## 2019-07-26 LAB — POCT PREGNANCY, URINE: Preg Test, Ur: NEGATIVE

## 2019-07-26 SURGERY — HYSTERECTOMY, TOTAL, LAPAROSCOPIC, WITH SALPINGECTOMY
Anesthesia: General | Site: Abdomen

## 2019-07-26 MED ORDER — ALBUTEROL SULFATE (2.5 MG/3ML) 0.083% IN NEBU: 2.5000 mg | INHALATION_SOLUTION | RESPIRATORY_TRACT | Status: DC | PRN

## 2019-07-26 MED ORDER — SIMETHICONE 80 MG PO CHEW: 80.0000 mg | CHEWABLE_TABLET | Freq: Four times a day (QID) | ORAL | Status: DC | PRN

## 2019-07-26 MED ORDER — PANTOPRAZOLE SODIUM 40 MG PO TBEC
40.0000 mg | DELAYED_RELEASE_TABLET | Freq: Every day | ORAL | Status: DC
  Administered 2019-07-27 – 2019-07-28 (×2): 40 mg via ORAL
  Filled 2019-07-26 (×3): qty 1

## 2019-07-26 MED ORDER — HYDROMORPHONE 1 MG/ML IV SOLN
INTRAVENOUS | Status: AC
  Filled 2019-07-26: qty 30

## 2019-07-26 MED ORDER — METOPROLOL TARTRATE 5 MG/5ML IV SOLN
INTRAVENOUS | Status: DC | PRN
  Administered 2019-07-26 (×2): 1 mg via INTRAVENOUS

## 2019-07-26 MED ORDER — KETOROLAC TROMETHAMINE 30 MG/ML IJ SOLN
30.0000 mg | Freq: Four times a day (QID) | INTRAMUSCULAR | Status: DC
  Administered 2019-07-26 – 2019-07-27 (×4): 30 mg via INTRAVENOUS
  Filled 2019-07-26 (×4): qty 1

## 2019-07-26 MED ORDER — NALOXONE HCL 0.4 MG/ML IJ SOLN: 0.4000 mg | INTRAMUSCULAR | Status: DC | PRN

## 2019-07-26 MED ORDER — LACTATED RINGERS IV SOLN: INTRAVENOUS | Status: DC | PRN

## 2019-07-26 MED ORDER — DIPHENHYDRAMINE HCL 50 MG/ML IJ SOLN: 12.5000 mg | Freq: Four times a day (QID) | INTRAMUSCULAR | Status: DC | PRN

## 2019-07-26 MED ORDER — FENTANYL CITRATE (PF) 250 MCG/5ML IJ SOLN
INTRAMUSCULAR | Status: AC
  Filled 2019-07-26: qty 5

## 2019-07-26 MED ORDER — ONDANSETRON HCL 4 MG PO TABS: 4.0000 mg | ORAL_TABLET | Freq: Four times a day (QID) | ORAL | Status: DC | PRN

## 2019-07-26 MED ORDER — HYDROMORPHONE 1 MG/ML IV SOLN
INTRAVENOUS | Status: DC
  Administered 2019-07-26: 30 mg via INTRAVENOUS

## 2019-07-26 MED ORDER — PHENYLEPHRINE 40 MCG/ML (10ML) SYRINGE FOR IV PUSH (FOR BLOOD PRESSURE SUPPORT)
PREFILLED_SYRINGE | INTRAVENOUS | Status: DC | PRN
  Administered 2019-07-26 (×4): 80 ug via INTRAVENOUS

## 2019-07-26 MED ORDER — HEMOSTATIC AGENTS (NO CHARGE) OPTIME
TOPICAL | Status: DC | PRN
  Administered 2019-07-26: 1 via TOPICAL

## 2019-07-26 MED ORDER — METOPROLOL TARTRATE 5 MG/5ML IV SOLN
INTRAVENOUS | Status: AC
  Filled 2019-07-26: qty 5

## 2019-07-26 MED ORDER — KETOROLAC TROMETHAMINE 30 MG/ML IJ SOLN
INTRAMUSCULAR | Status: AC
  Filled 2019-07-26: qty 1

## 2019-07-26 MED ORDER — KETOROLAC TROMETHAMINE 30 MG/ML IJ SOLN
30.0000 mg | Freq: Once | INTRAMUSCULAR | Status: AC | PRN
  Administered 2019-07-26: 15 mg via INTRAVENOUS

## 2019-07-26 MED ORDER — LACTATED RINGERS IV SOLN: INTRAVENOUS | Status: DC

## 2019-07-26 MED ORDER — MENTHOL 3 MG MT LOZG: 1.0000 | LOZENGE | OROMUCOSAL | Status: DC | PRN

## 2019-07-26 MED ORDER — ACETAMINOPHEN 10 MG/ML IV SOLN
1000.0000 mg | Freq: Four times a day (QID) | INTRAVENOUS | Status: DC
  Administered 2019-07-26 – 2019-07-27 (×3): 1000 mg via INTRAVENOUS
  Filled 2019-07-26 (×3): qty 100

## 2019-07-26 MED ORDER — MIDAZOLAM HCL 2 MG/2ML IJ SOLN
1.0000 mg | Freq: Once | INTRAMUSCULAR | Status: AC | PRN
  Administered 2019-07-26: 1 mg via INTRAVENOUS

## 2019-07-26 MED ORDER — ONDANSETRON HCL 4 MG/2ML IJ SOLN
INTRAMUSCULAR | Status: AC
  Filled 2019-07-26: qty 2

## 2019-07-26 MED ORDER — DIPHENHYDRAMINE HCL 12.5 MG/5ML PO ELIX: 12.5000 mg | ORAL_SOLUTION | Freq: Four times a day (QID) | ORAL | Status: DC | PRN

## 2019-07-26 MED ORDER — SODIUM CHLORIDE 0.9 % IV SOLN
2.0000 g | INTRAVENOUS | Status: AC
  Administered 2019-07-26: 3 g via INTRAVENOUS
  Filled 2019-07-26: qty 2

## 2019-07-26 MED ORDER — ONDANSETRON HCL 4 MG/2ML IJ SOLN: 4.0000 mg | Freq: Four times a day (QID) | INTRAMUSCULAR | Status: DC | PRN

## 2019-07-26 MED ORDER — FENTANYL CITRATE (PF) 250 MCG/5ML IJ SOLN
INTRAMUSCULAR | Status: DC | PRN
  Administered 2019-07-26: 50 ug via INTRAVENOUS
  Administered 2019-07-26: 150 ug via INTRAVENOUS
  Administered 2019-07-26: 50 ug via INTRAVENOUS

## 2019-07-26 MED ORDER — SODIUM CHLORIDE 0.9% FLUSH: 9.0000 mL | INTRAVENOUS | Status: DC | PRN

## 2019-07-26 MED ORDER — ROCURONIUM BROMIDE 10 MG/ML (PF) SYRINGE
PREFILLED_SYRINGE | INTRAVENOUS | Status: AC
  Filled 2019-07-26: qty 10

## 2019-07-26 MED ORDER — BUPIVACAINE HCL (PF) 0.25 % IJ SOLN
INTRAMUSCULAR | Status: DC | PRN
  Administered 2019-07-26: 5 mL
  Administered 2019-07-26: 20 mL

## 2019-07-26 MED ORDER — MEPERIDINE HCL 25 MG/ML IJ SOLN: 6.2500 mg | INTRAMUSCULAR | Status: DC | PRN

## 2019-07-26 MED ORDER — BUPIVACAINE HCL (PF) 0.25 % IJ SOLN
INTRAMUSCULAR | Status: AC
  Filled 2019-07-26: qty 30

## 2019-07-26 MED ORDER — OXYCODONE-ACETAMINOPHEN 5-325 MG PO TABS: 1.0000 | ORAL_TABLET | Freq: Four times a day (QID) | ORAL | Status: DC | PRN

## 2019-07-26 MED ORDER — HYDROMORPHONE 1 MG/ML IV SOLN: INTRAVENOUS | Status: DC

## 2019-07-26 MED ORDER — SODIUM CHLORIDE 0.9 % IV SOLN
1.0000 g | Freq: Once | INTRAVENOUS | Status: DC
  Filled 2019-07-26: qty 1

## 2019-07-26 MED ORDER — CHLORHEXIDINE GLUCONATE 0.12 % MT SOLN
15.0000 mL | Freq: Once | OROMUCOSAL | Status: AC
  Administered 2019-07-26: 15 mL via OROMUCOSAL
  Filled 2019-07-26: qty 15

## 2019-07-26 MED ORDER — DEXAMETHASONE SODIUM PHOSPHATE 10 MG/ML IJ SOLN
INTRAMUSCULAR | Status: DC | PRN
  Administered 2019-07-26: 10 mg via INTRAVENOUS

## 2019-07-26 MED ORDER — SUMATRIPTAN SUCCINATE 25 MG PO TABS
25.0000 mg | ORAL_TABLET | Freq: Once | ORAL | Status: DC | PRN
  Filled 2019-07-26: qty 1

## 2019-07-26 MED ORDER — DEXAMETHASONE SODIUM PHOSPHATE 10 MG/ML IJ SOLN
INTRAMUSCULAR | Status: AC
  Filled 2019-07-26: qty 1

## 2019-07-26 MED ORDER — MIDAZOLAM HCL 2 MG/2ML IJ SOLN
INTRAMUSCULAR | Status: AC
  Filled 2019-07-26: qty 2

## 2019-07-26 MED ORDER — SODIUM CHLORIDE 0.9 % IR SOLN
Status: DC | PRN
  Administered 2019-07-26: 1000 mL

## 2019-07-26 MED ORDER — KETOROLAC TROMETHAMINE 30 MG/ML IJ SOLN
INTRAMUSCULAR | Status: DC | PRN
Start: 2019-07-26 — End: 2019-07-26
  Administered 2019-07-26: 30 mg via INTRAVENOUS

## 2019-07-26 MED ORDER — PROPOFOL 10 MG/ML IV BOLUS
INTRAVENOUS | Status: DC | PRN
  Administered 2019-07-26: 200 mg via INTRAVENOUS

## 2019-07-26 MED ORDER — MIDAZOLAM HCL 2 MG/2ML IJ SOLN: 0.5000 mg | Freq: Once | INTRAMUSCULAR | Status: DC

## 2019-07-26 MED ORDER — CYCLOBENZAPRINE HCL 10 MG PO TABS
10.0000 mg | ORAL_TABLET | Freq: Three times a day (TID) | ORAL | Status: DC
  Administered 2019-07-26 – 2019-07-28 (×5): 10 mg via ORAL
  Filled 2019-07-26 (×5): qty 1

## 2019-07-26 MED ORDER — BUPIVACAINE LIPOSOME 1.3 % IJ SUSP
20.0000 mL | Freq: Once | INTRAMUSCULAR | Status: DC
  Filled 2019-07-26: qty 20

## 2019-07-26 MED ORDER — DOCUSATE SODIUM 100 MG PO CAPS
100.0000 mg | ORAL_CAPSULE | Freq: Two times a day (BID) | ORAL | Status: DC
  Administered 2019-07-26 – 2019-07-28 (×4): 100 mg via ORAL
  Filled 2019-07-26 (×4): qty 1

## 2019-07-26 MED ORDER — PROPOFOL 10 MG/ML IV BOLUS
INTRAVENOUS | Status: AC
  Filled 2019-07-26: qty 40

## 2019-07-26 MED ORDER — SUCCINYLCHOLINE CHLORIDE 200 MG/10ML IV SOSY
PREFILLED_SYRINGE | INTRAVENOUS | Status: DC | PRN
  Administered 2019-07-26: 200 mg via INTRAVENOUS

## 2019-07-26 MED ORDER — BUPIVACAINE LIPOSOME 1.3 % IJ SUSP
INTRAMUSCULAR | Status: DC | PRN
  Administered 2019-07-26: 20 mL

## 2019-07-26 MED ORDER — ORAL CARE MOUTH RINSE: 15.0000 mL | Freq: Once | OROMUCOSAL | Status: AC

## 2019-07-26 MED ORDER — SUGAMMADEX SODIUM 200 MG/2ML IV SOLN
INTRAVENOUS | Status: DC | PRN
  Administered 2019-07-26: 300 mg via INTRAVENOUS

## 2019-07-26 MED ORDER — PROMETHAZINE HCL 25 MG/ML IJ SOLN: 6.2500 mg | INTRAMUSCULAR | Status: DC | PRN

## 2019-07-26 MED ORDER — LIDOCAINE 2% (20 MG/ML) 5 ML SYRINGE
INTRAMUSCULAR | Status: DC | PRN
  Administered 2019-07-26: 100 mg via INTRAVENOUS

## 2019-07-26 MED ORDER — HYDROMORPHONE 1 MG/ML IV SOLN
INTRAVENOUS | Status: DC
  Administered 2019-07-27 (×2): 0 mg via INTRAVENOUS
  Administered 2019-07-27: 0.6 mg via INTRAVENOUS
  Administered 2019-07-27: 1.8 mg via INTRAVENOUS

## 2019-07-26 MED ORDER — SUCCINYLCHOLINE CHLORIDE 200 MG/10ML IV SOSY
PREFILLED_SYRINGE | INTRAVENOUS | Status: AC
  Filled 2019-07-26: qty 10

## 2019-07-26 MED ORDER — ONDANSETRON HCL 4 MG/2ML IJ SOLN
INTRAMUSCULAR | Status: DC | PRN
  Administered 2019-07-26: 4 mg via INTRAVENOUS

## 2019-07-26 MED ORDER — SERTRALINE HCL 50 MG PO TABS
50.0000 mg | ORAL_TABLET | Freq: Every day | ORAL | Status: DC
  Administered 2019-07-27 – 2019-07-28 (×2): 50 mg via ORAL
  Filled 2019-07-26 (×3): qty 1

## 2019-07-26 MED ORDER — MIDAZOLAM HCL 2 MG/2ML IJ SOLN
INTRAMUSCULAR | Status: DC | PRN
  Administered 2019-07-26: 2 mg via INTRAVENOUS

## 2019-07-26 MED ORDER — HYDROMORPHONE HCL 1 MG/ML IJ SOLN: 0.2500 mg | INTRAMUSCULAR | Status: DC | PRN

## 2019-07-26 MED ORDER — IBUPROFEN 600 MG PO TABS: 600.0000 mg | ORAL_TABLET | Freq: Four times a day (QID) | ORAL | Status: DC

## 2019-07-26 MED ORDER — LIDOCAINE 2% (20 MG/ML) 5 ML SYRINGE
INTRAMUSCULAR | Status: AC
  Filled 2019-07-26: qty 5

## 2019-07-26 MED ORDER — ROCURONIUM BROMIDE 10 MG/ML (PF) SYRINGE
PREFILLED_SYRINGE | INTRAVENOUS | Status: DC | PRN
  Administered 2019-07-26: 50 mg via INTRAVENOUS
  Administered 2019-07-26: 20 mg via INTRAVENOUS
  Administered 2019-07-26: 30 mg via INTRAVENOUS
  Administered 2019-07-26: 20 mg via INTRAVENOUS
  Administered 2019-07-26: 40 mg via INTRAVENOUS

## 2019-07-26 SURGICAL SUPPLY — 72 items
ADH SKN CLS APL DERMABOND .7 (GAUZE/BANDAGES/DRESSINGS) ×2
BARRIER ADHS 3X4 INTERCEED (GAUZE/BANDAGES/DRESSINGS) ×2 IMPLANT
BLADE 10 SAFETY STRL DISP (BLADE) ×4 IMPLANT
BRR ADH 4X3 ABS CNTRL BYND (GAUZE/BANDAGES/DRESSINGS)
CABLE HIGH FREQUENCY MONO STRZ (ELECTRODE) ×2 IMPLANT
COVER MAYO STAND STRL (DRAPES) ×4 IMPLANT
DERMABOND ADVANCED (GAUZE/BANDAGES/DRESSINGS) ×2
DERMABOND ADVANCED .7 DNX12 (GAUZE/BANDAGES/DRESSINGS) ×2 IMPLANT
DRAPE CESAREAN BIRTH W POUCH (DRAPES) ×2 IMPLANT
DRSG OPSITE POSTOP 4X10 (GAUZE/BANDAGES/DRESSINGS) ×2 IMPLANT
DURAPREP 26ML APPLICATOR (WOUND CARE) ×6 IMPLANT
ELECT BLADE 6.5 EXT (BLADE) ×2 IMPLANT
ELECT REM PT RETURN 9FT ADLT (ELECTROSURGICAL) ×4
ELECTRODE REM PT RTRN 9FT ADLT (ELECTROSURGICAL) IMPLANT
FILTER SMOKE EVAC LAPAROSHD (FILTER) ×2 IMPLANT
GLOVE BIO SURGEON STRL SZ7 (GLOVE) ×4 IMPLANT
GLOVE BIOGEL PI IND STRL 7.0 (GLOVE) ×8 IMPLANT
GLOVE BIOGEL PI INDICATOR 7.0 (GLOVE) ×8
GOWN STRL REUS W/ TWL LRG LVL3 (GOWN DISPOSABLE) ×6 IMPLANT
GOWN STRL REUS W/TWL LRG LVL3 (GOWN DISPOSABLE) ×12
HEMOSTAT ARISTA ABSORB 3G PWDR (HEMOSTASIS) ×2 IMPLANT
HIBICLENS CHG 4% 4OZ BTL (MISCELLANEOUS) ×4 IMPLANT
KIT TURNOVER KIT B (KITS) ×4 IMPLANT
LEGGING LITHOTOMY PAIR STRL (DRAPES) ×2 IMPLANT
LIGASURE IMPACT 36 18CM CVD LR (INSTRUMENTS) ×2 IMPLANT
LIGASURE VESSEL 5MM BLUNT TIP (ELECTROSURGICAL) ×2 IMPLANT
MANIPULATOR ADVINCU DEL 3.0 PL (MISCELLANEOUS) ×2 IMPLANT
MANIPULATOR ADVINCU DEL 3.5 PL (MISCELLANEOUS) IMPLANT
MANIPULATOR ADVINCU DEL 4.0 PL (MISCELLANEOUS) IMPLANT
NEEDLE INSUFFLATION 150MM (ENDOMECHANICALS) ×2 IMPLANT
NS IRRIG 1000ML POUR BTL (IV SOLUTION) ×4 IMPLANT
PACK LAPAROSCOPY BASIN (CUSTOM PROCEDURE TRAY) ×4 IMPLANT
PACK TRENDGUARD 450 HYBRID PRO (MISCELLANEOUS) IMPLANT
PACK TRENDGUARD 600 HYBRD PROC (MISCELLANEOUS) IMPLANT
PAD ABD 8X10 STRL (GAUZE/BANDAGES/DRESSINGS) ×2 IMPLANT
PROTECTOR NERVE ULNAR (MISCELLANEOUS) ×8 IMPLANT
RETRACTOR TRAXI PANNICULUS (MISCELLANEOUS) IMPLANT
RTRCTR C-SECT PINK 25CM LRG (MISCELLANEOUS) ×2 IMPLANT
SCISSORS LAP 5X35 DISP (ENDOMECHANICALS) ×2 IMPLANT
SET CYSTO W/LG BORE CLAMP LF (SET/KITS/TRAYS/PACK) IMPLANT
SET IRRIG TUBING LAPAROSCOPIC (IRRIGATION / IRRIGATOR) ×2 IMPLANT
SET TRI-LUMEN FLTR TB AIRSEAL (TUBING) IMPLANT
SET TUBE SMOKE EVAC HIGH FLOW (TUBING) ×4 IMPLANT
SLEEVE ENDOPATH XCEL 5M (ENDOMECHANICALS) ×4 IMPLANT
SUT MNCRL AB 4-0 PS2 18 (SUTURE) ×8 IMPLANT
SUT PDS AB 0 CTX 60 (SUTURE) ×2 IMPLANT
SUT VIC AB 0 CT1 18XCR BRD8 (SUTURE) IMPLANT
SUT VIC AB 0 CT1 27 (SUTURE) ×16
SUT VIC AB 0 CT1 27XBRD ANBCTR (SUTURE) IMPLANT
SUT VIC AB 0 CT1 27XCR 8 STRN (SUTURE) IMPLANT
SUT VIC AB 0 CT1 8-18 (SUTURE)
SUT VIC AB 2-0 CT1 (SUTURE) IMPLANT
SUT VIC AB 4-0 KS 27 (SUTURE) ×2 IMPLANT
SUT VICRYL 0 UR6 27IN ABS (SUTURE) ×4 IMPLANT
SUT VLOC 180 0 9IN  GS21 (SUTURE)
SUT VLOC 180 0 9IN GS21 (SUTURE) IMPLANT
SYR 10ML LL (SYRINGE) IMPLANT
SYR 50ML LL SCALE MARK (SYRINGE) IMPLANT
SYR BULB IRRIG 60ML STRL (SYRINGE) ×2 IMPLANT
SYR CONTROL 10ML LL (SYRINGE) ×2 IMPLANT
TOWEL GREEN STERILE FF (TOWEL DISPOSABLE) ×12 IMPLANT
TRAXI PANNICULUS RETRACTOR (MISCELLANEOUS) ×2
TRAY FOLEY W/BAG SLVR 14FR (SET/KITS/TRAYS/PACK) ×4 IMPLANT
TRENDGUARD 450 HYBRID PRO PACK (MISCELLANEOUS)
TRENDGUARD 600 HYBRID PROC PK (MISCELLANEOUS) ×4
TROCAR PORT AIRSEAL 8X120 (TROCAR) IMPLANT
TROCAR XCEL NON-BLD 11X100MML (ENDOMECHANICALS) ×4 IMPLANT
TROCAR XCEL NON-BLD 5MMX100MML (ENDOMECHANICALS) ×4 IMPLANT
TUBE 1/2X3/32 100FT (TUBING) ×2 IMPLANT
UNDERPAD 30X36 HEAVY ABSORB (UNDERPADS AND DIAPERS) ×4 IMPLANT
WARMER LAPAROSCOPE (MISCELLANEOUS) ×4 IMPLANT
YANKAUER SUCT BULB TIP NO VENT (SUCTIONS) ×2 IMPLANT

## 2019-07-26 NOTE — Progress Notes (Signed)
Notified DR. Pinn regarding pt's pain. Pt complaining pain is at 10/10 and pain med is not helping at all. New orders received.

## 2019-07-26 NOTE — Anesthesia Procedure Notes (Signed)
Procedure Name: Intubation Date/Time: 07/26/2019 9:36 AM Performed by: Michele Rockers, CRNA Pre-anesthesia Checklist: Patient identified, Patient being monitored, Emergency Drugs available and Suction available Patient Re-evaluated:Patient Re-evaluated prior to induction Oxygen Delivery Method: Circle System Utilized Preoxygenation: Pre-oxygenation with 100% oxygen Induction Type: IV induction Ventilation: Mask ventilation without difficulty Laryngoscope Size: Miller and 2 Grade View: Grade I Tube type: Oral Tube size: 7.5 mm Number of attempts: 1 Airway Equipment and Method: Stylet Placement Confirmation: ETT inserted through vocal cords under direct vision,  positive ETCO2 and breath sounds checked- equal and bilateral Secured at: 22 cm Tube secured with: Tape Dental Injury: Teeth and Oropharynx as per pre-operative assessment

## 2019-07-26 NOTE — Transfer of Care (Signed)
Immediate Anesthesia Transfer of Care Note  Patient: Melissa Erickson  Procedure(s) Performed: Attempted TOTAL LAPAROSCOPIC HYSTERECTOMY WITH SALPINGECTOMY (Bilateral Abdomen) Hysterectomy Abdominal With Salpingectomy (N/A Abdomen)  Patient Location: PACU  Anesthesia Type:General  Level of Consciousness: drowsy, patient cooperative and responds to stimulation  Airway & Oxygen Therapy: Patient Spontanous Breathing and Patient connected to face mask oxygen  Post-op Assessment: Report given to RN, Post -op Vital signs reviewed and stable and Patient moving all extremities  Post vital signs: Reviewed and stable  Last Vitals:  Vitals Value Taken Time  BP    Temp    Pulse    Resp    SpO2      Last Pain:  Vitals:   07/26/19 0716  TempSrc:   PainSc: 0-No pain      Patients Stated Pain Goal: 3 (83/72/90 2111)  Complications: No apparent anesthesia complications

## 2019-07-26 NOTE — H&P (Signed)
Melissa Erickson is an 39 y.o. female AAF P1 presents for scheduled hysterectomy for symptomatic fibroid uterus manifested by abnormal bleeding and dysmenorrhea.   Pertinent Gynecological History: Menses: flow is excessive with use of 6 pads or tampons on heaviest days Bleeding: dysfunctional uterine bleeding Contraception: none DES exposure: denies Blood transfusions: none Sexually transmitted diseases: no past history Previous GYN Procedures: DNC  Last mammogram: normal Date: 2021 Last pap: normal Date: 2021 OB History: G1, P1   Menstrual History: Menarche age: 39 Patient's last menstrual period was 07/22/2019.    Past Medical History:  Diagnosis Date  . Allergy   . Asthma   . Chicken pox   . Environmental allergies    Seasonal  . Essential hypertension 07/18/2013  . Gallstones   . Heart attack ([place])    NSTEMI 07/2013, demand ischemia in setting of ARDS, post-gastric sleeve   . History of congestive heart failure 2015  . History of pneumonia   . Migraine   . Polycystic ovarian syndrome   . Sleep apnea   . Suppurative hidradenitis 07/21/2017  . Ventral hernia     Past Surgical History:  Procedure Laterality Date  . BREAST BIOPSY Right 2001   Normal  . CESAREAN SECTION  2007  . CHOLECYSTECTOMY    . ESOPHAGOGASTRODUODENOSCOPY N/A 07/19/2013   Procedure: ESOPHAGOGASTRODUODENOSCOPY (EGD);  Surgeon: Harl Bowie, MD;  Location: [place];  Service: General;  Laterality: N/A;  . LAPAROSCOPIC GASTRIC SLEEVE RESECTION    . LAPAROSCOPY N/A 07/19/2013   Procedure: LAPAROSCOPY DIAGNOSTIC;  Surgeon: Harl Bowie, MD;  [facility];  Service: General;  Laterality: N/A;    Family History  Problem Relation Age of Onset  . Diabetes Mother   . Stroke Mother   . Miscarriages / [place] Mother   . Sarcoidosis Father   . Alcohol abuse Father   . Early death Father   . Healthy Brother   . Alzheimer's disease Maternal Grandmother   . Heart attack Maternal Grandmother   .  Diabetes Maternal Grandfather   . COPD Maternal Grandfather   . Cancer Maternal Grandfather   . Cancer Paternal Grandmother   . Cerebral aneurysm Paternal Grandmother   . Early death Paternal Grandmother   . Cancer Paternal Grandfather   . Allergic rhinitis Daughter     Social History:  reports that she has never smoked. She has never used smokeless tobacco. She reports current alcohol use. She reports that she does not use drugs.  Allergies: No Known Allergies  Medications Prior to Admission  Medication Sig Dispense Refill Last Dose  . ALPRAZolam (XANAX) 0.5 MG tablet Take 0.5 mg by mouth 3 (three) times daily as needed.   Past Week at Unknown time  . Multiple Vitamins-Minerals (MULTIVITAMIN WITH MINERALS) tablet Take 1 tablet by mouth daily.     . sertraline (ZOLOFT) 50 MG tablet Take 50 mg by mouth daily.   Past Week at Unknown time  . SUMAtriptan (IMITREX) 25 MG tablet Take 1 tablet (25 mg total) by mouth once as needed for up to 1 dose for migraine. May repeat in 2 hours if headache persists or recurs. 10 tablet 2 Past Month at Unknown time  . albuterol (VENTOLIN HFA) 108 (90 Base) MCG/ACT inhaler Inhale 2 puffs into the lungs every 4 (four) hours as needed for wheezing or shortness of breath. 18 g 2 More than a month at Unknown time  . triamcinolone (NASACORT) 55 MCG/ACT AERO nasal inhaler Place 2 sprays into the  nose daily. (Patient not taking: Reported on 07/14/2019) 1 Inhaler 12 Not Taking at Unknown time    Review of Systems  All other systems reviewed and are negative.   Blood pressure (!) 143/88, pulse 78, temperature 98.1 F (36.7 C), temperature source Oral, resp. rate 18, height 5\' 5"  (1.651 m), weight (!) 154.2 kg, last menstrual period 07/22/2019, SpO2 100 %. Physical Exam  Nursing note and vitals reviewed. Constitutional: She appears well-developed and well-nourished.  Pelvic exam: done in office.  Current exam deferred to OR  Results for orders placed or performed  during the hospital encounter of 07/26/19 (from the past 24 hour(s))  Type and screen     Status: None   Collection Time: 07/26/19  7:10 AM  Result Value Ref Range   ABO/RH(D) O POS    Antibody Screen NEG    Sample Expiration      07/29/2019,2359 Performed at [facility], [place] [address] [place], [place] [address]   Basic metabolic panel     Status: Abnormal   Collection Time: 07/26/19  7:14 AM  Result Value Ref Range   Sodium 139 135 - 145 mmol/L   Potassium 3.7 3.5 - 5.1 mmol/L   Chloride 107 98 - 111 mmol/L   CO2 24 22 - 32 mmol/L   Glucose, Bld 97 70 - 99 mg/dL   BUN 10 6 - 20 mg/dL   Creatinine, Ser 0.78 0.44 - 1.00 mg/dL   Calcium 8.7 (L) 8.9 - 10.3 mg/dL   GFR calc non Af Amer >60 >60 mL/min   GFR calc Af Amer >60 >60 mL/min   Anion gap 8 5 - 15  CBC     Status: Abnormal   Collection Time: 07/26/19  7:14 AM  Result Value Ref Range   WBC 6.6 4.0 - 10.5 K/uL   RBC 4.99 3.87 - 5.11 MIL/uL   Hemoglobin 12.5 12.0 - 15.0 g/dL   HCT 42.0 36.0 - 46.0 %   MCV 84.2 80.0 - 100.0 fL   MCH 25.1 (L) 26.0 - 34.0 pg   MCHC 29.8 (L) 30.0 - 36.0 g/dL   RDW 15.0 11.5 - 15.5 %   Platelets 243 150 - 400 K/uL   nRBC 0.0 0.0 - 0.2 %  Pregnancy, urine POC     Status: None   Collection Time: 07/26/19  7:34 AM  Result Value Ref Range   Preg Test, Ur NEGATIVE NEGATIVE    No results found.  Assessment/Plan: 39 year old with symptomatic fibroid uterus.  On call to OR for TLH/ possible TAH bilateral salpingectomy / cystoscopy Discussed with patient RBAI of surgery and she agrees to proceed.  Consent signed, witnessed and scanned into chart  Melissa Erickson 07/26/2019, 8:51 AM

## 2019-07-26 NOTE — Progress Notes (Signed)
Patient arrived to 6N31, Aox4, VSS, placed belongings, call bell,  within reach, Oriented to room. Denies pain. Assessed incision dressing, clean, dry and intact. Will continue to monitor.

## 2019-07-27 LAB — CBC
HCT: 34 % — ABNORMAL LOW (ref 36.0–46.0)
Hemoglobin: 10.3 g/dL — ABNORMAL LOW (ref 12.0–15.0)
MCH: 25.3 pg — ABNORMAL LOW (ref 26.0–34.0)
MCHC: 30.3 g/dL (ref 30.0–36.0)
MCV: 83.5 fL (ref 80.0–100.0)
Platelets: 239 10*3/uL (ref 150–400)
RBC: 4.07 MIL/uL (ref 3.87–5.11)
RDW: 15.1 % (ref 11.5–15.5)
WBC: 11.7 10*3/uL — ABNORMAL HIGH (ref 4.0–10.5)
nRBC: 0 % (ref 0.0–0.2)

## 2019-07-27 LAB — SURGICAL PATHOLOGY

## 2019-07-27 MED ORDER — OXYCODONE-ACETAMINOPHEN 5-325 MG PO TABS
2.0000 | ORAL_TABLET | Freq: Four times a day (QID) | ORAL | Status: DC
  Administered 2019-07-27 – 2019-07-28 (×2): 2 via ORAL
  Filled 2019-07-27 (×3): qty 2

## 2019-07-27 MED ORDER — IBUPROFEN 800 MG PO TABS
800.0000 mg | ORAL_TABLET | Freq: Three times a day (TID) | ORAL | Status: DC
  Administered 2019-07-27 – 2019-07-28 (×2): 800 mg via ORAL
  Filled 2019-07-27: qty 2
  Filled 2019-07-27: qty 1

## 2019-07-27 MED ORDER — FENTANYL CITRATE (PF) 100 MCG/2ML IJ SOLN: 100.0000 ug | INTRAMUSCULAR | Status: DC | PRN

## 2019-07-27 NOTE — Progress Notes (Signed)
Melissa Erickson is a30 y.o.  [id number]  Post Op Date #1: Laparoscopy; Total Abdominal Hysterectomy/Bilateral Salpingectomy  Subjective: Patient is Doing well postoperatively. Patient has Pain is controlled with current analgesics. Medications being used: prescription NSAID's including Ketorolac 30 mg IV, narcotic analgesics including Dilaudid 1 mg IV and Cyclobenzaprine 10 mg. Ambulating in the halls without difficulty, tolerating full liquid diet and has passed flatus.  She has not voided yet since Foley has been removed.   Objective: Vital signs in last 24 hours: Temp:  [97.4 F (36.3 C)-98.4 F (36.9 C)] 98.4 F (36.9 C) (06/09 0604) Pulse Rate:  [77-101] 79 (06/09 0604) Resp:  [12-22] 18 (06/09 0604) BP: (97-141)/(58-83) 141/78 (06/09 0604) SpO2:  [94 %-100 %] 96 % (06/09 0604)  Intake/Output from previous day: 06/08 0701 - 06/09 0700 In: 4904.8 [P.O.:740; I.V.:3964.8] Out: 950 [Urine:650] Intake/Output this shift: No intake/output data recorded. Recent Labs  Lab 07/26/19 0714 07/27/19 0258  WBC 6.6 11.7*  HGB 12.5 10.3*  HCT 42.0 34.0*  PLT 243 239     Recent Labs  Lab 07/26/19 0714  NA 139  K 3.7  CL 107  CO2 24  BUN 10  CREATININE 0.78  CALCIUM 8.7*  GLUCOSE 97    EXAM: General: alert, cooperative and no distress Resp: clear to auscultation bilaterally Cardio: regular rate and rhythm, S1, S2 normal, no murmur, click, rub or gallop GI: bowel sounds present and dressing is clean/dry and intact Extremities: Homans sign is negative, no sign of DVT and no calf tenderness.   Assessment: s/p Procedure(s): Attempted TOTAL LAPAROSCOPIC HYSTERECTOMY WITH SALPINGECTOMY Hysterectomy Abdominal With Salpingectomy: stable, progressing well and anemia  Plan: Advance diet Encourage ambulation Advance to PO medication Routine Care    LOS: 1 day    Earnstine Regal, PA-C 07/27/2019 7:02 AM

## 2019-07-27 NOTE — Progress Notes (Signed)
1 Day Post-Op Procedure(s) (LRB): Attempted TOTAL LAPAROSCOPIC HYSTERECTOMY WITH SALPINGECTOMY (Bilateral) Hysterectomy Abdominal With Salpingectomy (N/A)  Subjective: Patient reports tolerating PO, + flatus and no problems voiding.    Objective: I have reviewed patient's vital signs and labs.  General: alert, cooperative and no distress GI: soft, non-tender; bowel sounds normal; no masses,  no organomegaly and incision: clean, dry and intact  Assessment: s/p Procedure(s): Attempted TOTAL LAPAROSCOPIC HYSTERECTOMY WITH SALPINGECTOMY (Bilateral) Hysterectomy Abdominal With Salpingectomy (N/A): progressing well  Plan: Encourage ambulation Advance to PO medication Discontinue IV fluids  LOS: 1 day    [facility] 07/27/2019, 5:45 PM

## 2019-07-28 ENCOUNTER — Encounter (HOSPITAL_COMMUNITY): Payer: Self-pay | Admitting: Obstetrics & Gynecology

## 2019-07-28 MED ORDER — OXYCODONE-ACETAMINOPHEN 5-325 MG PO TABS: ORAL_TABLET | ORAL | 0 refills | Status: DC

## 2019-07-28 MED ORDER — CYCLOBENZAPRINE HCL 10 MG PO TABS: 10.0000 mg | ORAL_TABLET | Freq: Three times a day (TID) | ORAL | 3 refills | Status: DC | PRN

## 2019-07-28 MED ORDER — IBUPROFEN 800 MG PO TABS
ORAL_TABLET | ORAL | 1 refills | Status: DC
Start: 2019-07-28 — End: 2019-07-28

## 2019-07-28 MED ORDER — IBUPROFEN 800 MG PO TABS
ORAL_TABLET | ORAL | 1 refills | Status: DC
Start: 2019-07-28 — End: 2021-06-19

## 2019-07-28 NOTE — Discharge Summary (Signed)
Physician Discharge Summary  Patient ID: Melissa Erickson MRN: [id number] DOB/AGE: 41-Nov-1982 41 y.o.  Admit date: 07/26/2019 Discharge date: 07/28/2019   Discharge Diagnoses:  Active Problems:   Leiomyoma of uterus, unspecified   Menorrhagia   Operation: Attempted Total Laparoscopic Hysterectomy with Bilateral Salpingectomy Total Abdominal Hysterectomy With Bilateral Salpingectomy    Discharged Condition: Good  Hospital Course: On the date of admission the patient underwent the aforementioned procedures and tolerated them well.  Post operative course was unremarkable with the patient resuming bowel and bladder function by post operative day #2 and was therefore deemed ready for discharge home.  Discharge hemoglobin was 10.3.  Disposition: Home to Self Care  Discharge Medications:  Allergies as of 07/28/2019   No Known Allergies     Medication List    TAKE these medications   albuterol 108 (90 Base) MCG/ACT inhaler Commonly known as: VENTOLIN HFA Inhale 2 puffs into the lungs every 4 (four) hours as needed for wheezing or shortness of breath.   ALPRAZolam 0.5 MG tablet Commonly known as: XANAX Take 0.5 mg by mouth 3 (three) times daily as needed.   ibuprofen 800 MG tablet Commonly known as: ADVIL take 1 tablet po pc every 8 hours for 5 days then prn for pain   multivitamin with minerals tablet Take 1 tablet by mouth daily.   oxyCODONE-acetaminophen 5-325 MG tablet Commonly known as: PERCOCET/ROXICET take 1-2 tablets every 6 hours prn for breakthrough post-operative pain   sertraline 50 MG tablet Commonly known as: ZOLOFT Take 50 mg by mouth daily.   SUMAtriptan 25 MG tablet Commonly known as: Imitrex Take 1 tablet (25 mg total) by mouth once as needed for up to 1 dose for migraine. May repeat in 2 hours if headache persists or recurs.   triamcinolone 55 MCG/ACT Aero nasal inhaler Commonly known as: NASACORT Place 2 sprays into the nose daily.         Follow-up: Dr. Sanjuana Kava on August 05, 2019 at 1 pm and September 06, 2019 at 4:15 pm   Signed: Earnstine Regal, PA-C 07/28/2019, 6:02 AM

## 2019-07-28 NOTE — Progress Notes (Addendum)
Melissa Erickson is a15 y.o.  [id number]  Post Op Date # 2: Attempted TOTAL LAPAROSCOPIC HYSTERECTOMY WITH SALPINGECTOMY (Bilateral) Hysterectomy Abdominal With Salpingectomy   Subjective: Patient is Doing well postoperatively. Patient has Pain is controlled with current analgesics. Medications being used: prescription NSAID's including Ibuprofen 800 mg and narcotic analgesics including Percocet 5/325 mg. Tolerating a regular diet, voiding without difficulty and ambulating in the halls.   Objective: Vital signs in last 24 hours: Temp:  [98.4 F (36.9 C)-98.8 F (37.1 C)] 98.5 F (36.9 C) (06/10 0505) Pulse Rate:  [74-80] 80 (06/10 0505) Resp:  [17-22] 17 (06/10 0505) BP: (125-143)/(69-88) 136/84 (06/10 0505) SpO2:  [94 %-100 %] 99 % (06/10 0505)  Intake/Output from previous day: 06/09 0701 - 06/10 0700 In: 420 [P.O.:420] Out: -  Intake/Output this shift: No intake/output data recorded. Recent Labs  Lab 07/26/19 0714 07/27/19 0258  WBC 6.6 11.7*  HGB 12.5 10.3*  HCT 42.0 34.0*  PLT 243 239     Recent Labs  Lab 07/26/19 0714  NA 139  K 3.7  CL 107  CO2 24  BUN 10  CREATININE 0.78  CALCIUM 8.7*  GLUCOSE 97    EXAM: General: alert, cooperative and no distress Resp: clear to auscultation bilaterally Cardio: regular rate and rhythm, S1, S2 normal, no murmur, click, rub or gallop GI: bowel sounds present in all 4 quadrants; wound dressings are clean/dry/intact Extremities: Homans sign is negative, no sign of DVT and no calf tenderness.   Assessment: s/p Procedure(s): Attempted TOTAL LAPAROSCOPIC HYSTERECTOMY WITH SALPINGECTOMY Hysterectomy Abdominal With Salpingectomy: stable, progressing well, tolerating diet and anemia  Plan: Discharge home  LOS: 2 days    Earnstine Regal, Hershal Coria 07/28/2019 5:49 AM  Discussed discharge plan with patient via telephone.  She has follow up appointment with me next week.  She also has my personal mobile phone number in case of  emergency.   Rogelio Seen Dorthula Bier

## 2019-07-28 NOTE — Op Note (Signed)
OPERATIVE NOTE  Melissa Erickson female 39 y.o. 07/26/2019   Pre-Operative Diagnosis:  Symptomatic Fibroid uterus  Postoperative diagnoses: same as above  Procedure:  Diagnostic Laparoscopy (attempted TLH), Total abdominal hysterectomy, bilateral salpingectomy   Surgeon: Dr. Sanjuana Kava  Assistant: Earnstine Regal, PA  EBL: 300 mL         Drains: Foley catheter   Urine Output: 150 mL clear urine at end of procedure        Total IV Fluids: 2400 ml  Blood Given: none         Specimen: uterus with cervix, bilateral fallopian tubes          Implants: none           Anesthesia type: General ETA; marcaine  Complications: None  Condition: stable  Indications: The patient was admitted to the hospital for scheduled TLH due to symptomatic fibroid uterus manifested by abnormal uterine bleeding and dysmenorrhea.   Operative findings: Adhesive disease from anterior uterus to bladder from prior cesarean section. Enlarged 13 weeks irregular uterus with globular appearance c/w adenomyosis,  Normal bilateral tubes and normal bilateral ovaries.  Cystoscopy: Normal urothelium and brisk ureteral jets from both ureters.   Technique: The patient was brought into the operating room with an intravenous line in placed, and anesthetic was administered. She was placed in a low dorsal lithotomy position using Allen stirrups. The patient's  abdomen was prepped with ChloraPrep. The perineum and vagina were prepped with multiple layers of Betadine the bladder catheterized with a foley.    A Time out was performed confirming the patient name and procedure.  An operative Grave's speculum was placed in the vagina and the anterior lip of the cervix was held with a single tooth tenaculum. The uterus was sounded to 9cm  and dilated with [facility] dilators. The tenaculum was removed and the anterior lip of the cervix held with a stitch of 0-vicryl. 3.5 cm Advincula uterine manipulator was placed and the intrauterine  balloon inflated. The  Speculum was removed. Surgeons gloves and gown were changed and attention turned to the abdomen.   The patient was sterilely draped. The subumbilical area was injected with half percent Marcaine.  An incision was made in the umbilicus with 11 blade scalpel after infiltration with 0.5% Marcaine. A 27mm Excel Visiport trocar was used with l28mm 0 degree laparoscope attached to perform direct entry into the patient's abdomen. Direct video visualization confirmed entry into the abdomen and pneumoperitoneum was obtained using approximately 3L CO2 gas. The patient was placed in steep Trendelenburg position.There was no injury noted with placement of trocars. The pelvic contents were visualized and it was apparent that there was minimal visualization of the field due to the size of the fibroid uterus, therefore decision was made to convert to an open procedure.  Pictures were taken of the patient's pelvic structures.  The visualization was poor laparoscopically and the uterus appeared very adherent to the bladder therefore decision was made to convert to an open laparotomy to complete the hysterectomy.  The laparoscope and trochars were removed. The umbilical fascia was closed with 0-vicryl on UR-6. The skin closed in subcuticular fashion using 4-0 monocryl and dermabond. The patient was re-prepped and re-draped.   A Pfannenstiel incision was made with a 10 blade scalpel along prior Pfannenstiel scar and carried down to the fascia.  The fascia was incised with the scalpel in a transverse manner and extended in a transverse curvilinear manner.  The rectus muscles were split  in the midline and a bowel free portion of the peritoneum was tented up.  It was entered into with the hemostat and extended in a superior and inferior manner with good visualization of the bowel and bladder. The Alexis retractor was placed in the abdomen and the  bowel was packed away. The uterus was brought out of the pelvis  by holding each cornual end with large kelly clamps. The right salpinx was cauterized and transected off the ovary using the Ligasure Impact. This was performed bilaterally.  The round ligaments were suture ligated with 0-vicryl bilaterally with the suture tagged and left long. The Round ligament was then incised with the bovie.  The bovie was then used to incise a transverse curvilinear incision in the vesico-cervical fascia. The Bladder flap was created and the bladder mobilized downward. The ureters were visualized bilaterally retroperitoneally coursing well below the operative field. The incisions were extended to parallel the IP ligaments and the uterine arteries were skeletonized.    The uterine-ovarian ligaments bilaterally were then clamped, cauterized and transected with the Ligasure. Each pedicle was noted to be hemostatic.   The bladder flap was then retracted inferiorly with blunt and cautery dissection below the external cervical os.  The uterine arteries were double clamped bilaterally with the heaneys and incised with the mayos.  Each pedicle was then secured with a stitch of 0-vicryl.  The uterosacral cardinal ligament complex was transected with the Ligasure bilaterally. The bladder was ensured to be down and then along the KOH ring the vaginal cuff was incised revealing the blue KOH. The uterus was then removed vaginally and handed off the field to be sent to pathology. The cuff angles were then secured with 0-vicryl and the cuff was closed with interrupted figure of eight stitches of 0-vicryl.  Hemostasis was insured. Irrigation was performed and hemostasis was assured.  Aristra powder was placed over the cuff to ensure further hemostasis.   The instruments and laps were removed from the abdominal cavity and the peritoneum was closed with a running stitch of 2-vicryl. Interrupted suture of 2-0 chromic re-approximated the rectus muscle.The fascia was closed with looped 0-PDS in a running  manner.  The subcutaneous tissue was infiltrated with 0.5 % Marcaine diluted with Expiril. It was then closed with interrupted stitches of 2-0 plain gut after hemostasis was achieved with the bovie and irrigation.  The skin was closed with 3-0 vicryl sub-q with a keith needle. Benzoin, steri strips and a honeycomb bandage was placed. The umbilical incision was closed next. The fascia was closed with an interrupted figure-of-eight stitch using 0 Vicryl on a UR-6 the skin was closed in subcuticular fashion with 4-0 monocryl and covered with Dermabond and tegaderm.  Marland Kitchen    Sponge, needle and instrument counts were correct x 3. Pt tolerated the procedure well and was transferred to the recovery room in stable condition.    Rogelio Seen S. Charletta Voight MD

## 2019-07-28 NOTE — Discharge Instructions (Signed)
Call [facility] @ [phone] if:  You have a temperature greater than or equal to 100.4 degrees Farenheit orally You have pain that is not made better by the pain medication given and taken as directed You have excessive bleeding or problems urinating  Take Colace (Docusate Sodium/Stool Softener) 100 mg 2-3 times daily while taking narcotic pain medicine and/or iron,  to avoid constipation or until bowel movements are regular. Take, with food, Ibuprofen 800 mg every 8  hours for 5 days then as needed for pain Take iron supplement of your choice twice a day for 6 weeks  You may drive after 2 weeks You may walk up steps  You may shower  You may resume a regular diet  Keep incisions clean and dry; remove honeycomb dressing on 08/02/2019 Do not lift over 15 pounds for 6 weeks Avoid anything in vagina for 6 weeks (or until after your post-operative visit)

## 2019-07-29 NOTE — Anesthesia Postprocedure Evaluation (Signed)
Anesthesia Post Note  Patient: Melissa Erickson  Procedure(s) Performed: Attempted TOTAL LAPAROSCOPIC HYSTERECTOMY WITH SALPINGECTOMY (Bilateral Abdomen) Hysterectomy Abdominal With Salpingectomy (N/A Abdomen)     Patient location during evaluation: PACU Anesthesia Type: General Level of consciousness: sedated Pain management: pain level controlled Vital Signs Assessment: post-procedure vital signs reviewed and stable Respiratory status: spontaneous breathing Cardiovascular status: stable Postop Assessment: no apparent nausea or vomiting Anesthetic complications: no   No complications documented.  Last Vitals:  Vitals:   07/28/19 0505 07/28/19 0505  BP: 136/84 136/84  Pulse:  80  Resp:  17  Temp: 36.9 C 36.9 C  SpO2: 99% 99%    Last Pain:  Vitals:   07/28/19 0830  TempSrc:   PainSc: 0-No pain                 Huston Foley

## 2020-06-11 ENCOUNTER — Encounter (HOSPITAL_BASED_OUTPATIENT_CLINIC_OR_DEPARTMENT_OTHER): Payer: Self-pay

## 2020-06-11 ENCOUNTER — Other Ambulatory Visit: Payer: Self-pay

## 2020-06-11 DIAGNOSIS — M25512 Pain in left shoulder: Secondary | ICD-10-CM | POA: Insufficient documentation

## 2020-06-11 DIAGNOSIS — I509 Heart failure, unspecified: Secondary | ICD-10-CM | POA: Insufficient documentation

## 2020-06-11 DIAGNOSIS — I110 Hypertensive heart disease with heart failure: Secondary | ICD-10-CM | POA: Insufficient documentation

## 2020-06-11 DIAGNOSIS — X501XXA Overexertion from prolonged static or awkward postures, initial encounter: Secondary | ICD-10-CM | POA: Diagnosis not present

## 2020-06-11 DIAGNOSIS — J45909 Unspecified asthma, uncomplicated: Secondary | ICD-10-CM | POA: Insufficient documentation

## 2020-06-11 NOTE — ED Triage Notes (Signed)
Patient here POV from Home with L. Arm Pain.  Pain began 3 weeks ago and was seen outpatient but prescriptions have not been helpful.  Pain began to shift today to upper arm and patient decided to be seen. Patient unable to lift arm.  Ambulatory, GCS 15. No recent trauma

## 2020-06-12 ENCOUNTER — Emergency Department (HOSPITAL_BASED_OUTPATIENT_CLINIC_OR_DEPARTMENT_OTHER): Payer: BC Managed Care – PPO | Admitting: Radiology

## 2020-06-12 ENCOUNTER — Emergency Department (HOSPITAL_BASED_OUTPATIENT_CLINIC_OR_DEPARTMENT_OTHER)
Admission: EM | Admit: 2020-06-12 | Discharge: 2020-06-12 | Disposition: A | Discharge status: Home / Self Care | Payer: BC Managed Care – PPO | Attending: Emergency Medicine | Admitting: Emergency Medicine

## 2020-06-12 DIAGNOSIS — M25512 Pain in left shoulder: Secondary | ICD-10-CM

## 2020-06-12 MED ORDER — NAPROXEN 500 MG PO TABS
500.0000 mg | ORAL_TABLET | Freq: Two times a day (BID) | ORAL | 0 refills | Status: DC
Start: 2020-06-12 — End: 2021-08-09

## 2020-06-12 MED ORDER — KETOROLAC TROMETHAMINE 60 MG/2ML IM SOLN
60.0000 mg | Freq: Once | INTRAMUSCULAR | Status: AC
  Administered 2020-06-12: 60 mg via INTRAMUSCULAR
  Filled 2020-06-12: qty 2

## 2020-06-12 MED ORDER — METHOCARBAMOL 500 MG PO TABS
500.0000 mg | ORAL_TABLET | Freq: Two times a day (BID) | ORAL | 0 refills | Status: DC
Start: 2020-06-12 — End: 2021-08-09

## 2020-06-12 NOTE — ED Provider Notes (Signed)
[facility] Provider Note  CSN: [id number] Arrival date & time: 06/11/20 2214    History Chief Complaint  Patient presents with  . Arm Pain    Left    HPI  Melissa Erickson is a 40 y.o. female presents for evaluation of L arm pain for the last several weeks. States she woke up one morning and the L shoulder area was sore. She thought she may have slept on it wrong, so she gave it several days but it did not improve. She went to an UC and was given Rx for naprosyn which has not helped. It has gradually been worsening, now L shoulder is painful to move or lift. Pain does not go into her neck and does not radiate down her arm. She denies any numbness or tingling in the hand/fingers. Does not have any known injury.    Past Medical History:  Diagnosis Date  . Allergy   . Asthma   . Chicken pox   . Environmental allergies    Seasonal  . Essential hypertension 07/18/2013  . Gallstones   . Heart attack ([place])    NSTEMI 07/2013, demand ischemia in setting of ARDS, post-gastric sleeve   . History of congestive heart failure 2015  . History of pneumonia   . Migraine   . Polycystic ovarian syndrome   . Sleep apnea   . Suppurative hidradenitis 07/21/2017  . Ventral hernia     Past Surgical History:  Procedure Laterality Date  . BREAST BIOPSY Right 2001   Normal  . CESAREAN SECTION  2007  . CHOLECYSTECTOMY    . ESOPHAGOGASTRODUODENOSCOPY N/A 07/19/2013   Procedure: ESOPHAGOGASTRODUODENOSCOPY (EGD);  Surgeon: Harl Bowie, MD;  Location: [facility];  Service: General;  Laterality: N/A;  . HYSTERECTOMY ABDOMINAL WITH SALPINGECTOMY N/A 07/26/2019   Procedure: Hysterectomy Abdominal With Salpingectomy;  Surgeon: Sanjuana Kava, MD;  Location: [facility];  Service: Gynecology;  Laterality: N/A;  . LAPAROSCOPIC GASTRIC SLEEVE RESECTION    . LAPAROSCOPY N/A 07/19/2013   Procedure: LAPAROSCOPY DIAGNOSTIC;  Surgeon: Harl Bowie, MD;  Location: [facility];  Service: General;   Laterality: N/A;  . TOTAL LAPAROSCOPIC HYSTERECTOMY WITH SALPINGECTOMY Bilateral 07/26/2019   Procedure: Attempted TOTAL LAPAROSCOPIC HYSTERECTOMY WITH SALPINGECTOMY;  Surgeon: Sanjuana Kava, MD;  Location: [facility];  Service: Gynecology;  Laterality: Bilateral;    Family History  Problem Relation Age of Onset  . Diabetes Mother   . Stroke Mother   . Miscarriages / [place] Mother   . Sarcoidosis Father   . Alcohol abuse Father   . Early death Father   . Healthy Brother   . Alzheimer's disease Maternal Grandmother   . Heart attack Maternal Grandmother   . Diabetes Maternal Grandfather   . COPD Maternal Grandfather   . Cancer Maternal Grandfather   . Cancer Paternal Grandmother   . Cerebral aneurysm Paternal Grandmother   . Early death Paternal Grandmother   . Cancer Paternal Grandfather   . Allergic rhinitis Daughter     Social History   Tobacco Use  . Smoking status: Never Smoker  . Smokeless tobacco: Never Used  Vaping Use  . Vaping Use: Never used  Substance Use Topics  . Alcohol use: Yes    Comment: once a month  . Drug use: Never     Home Medications Prior to Admission medications   Medication Sig Start Date End Date Taking? Authorizing Provider  methocarbamol (ROBAXIN) 500 MG tablet Take 1 tablet (500 mg total) by  mouth 2 (two) times daily. 06/12/20  Yes Truddie Hidden, MD  naproxen (NAPROSYN) 500 MG tablet Take 1 tablet (500 mg total) by mouth 2 (two) times daily. 06/12/20  Yes Truddie Hidden, MD  albuterol (VENTOLIN HFA) 108 (90 Base) MCG/ACT inhaler Inhale 2 puffs into the lungs every 4 (four) hours as needed for wheezing or shortness of breath. 02/23/19   Leamon Arnt, MD  ALPRAZolam Duanne Moron) 0.5 MG tablet Take 0.5 mg by mouth 3 (three) times daily as needed. 06/01/19   [provider]  cyclobenzaprine (FLEXERIL) 10 MG tablet Take 1 tablet (10 mg total) by mouth every 8 (eight) hours as needed for muscle spasms. 07/28/19   Sanjuana Kava, MD  ibuprofen  (ADVIL) 800 MG tablet take 1 tablet po pc every 8 hours for 5 days then prn for pain 07/28/19   Sanjuana Kava, MD  Multiple Vitamins-Minerals (MULTIVITAMIN WITH MINERALS) tablet Take 1 tablet by mouth daily.    [provider]  oxyCODONE-acetaminophen (PERCOCET/ROXICET) 5-325 MG tablet take 1-2 tablets every 6 hours prn for breakthrough post-operative pain 07/28/19   Sanjuana Kava, MD  sertraline (ZOLOFT) 50 MG tablet Take 50 mg by mouth daily. 06/01/19   [provider]  SUMAtriptan (IMITREX) 25 MG tablet Take 1 tablet (25 mg total) by mouth once as needed for up to 1 dose for migraine. May repeat in 2 hours if headache persists or recurs. 02/23/19   Leamon Arnt, MD  triamcinolone (NASACORT) 55 MCG/ACT AERO nasal inhaler Place 2 sprays into the nose daily. Patient not taking: Reported on 07/14/2019 03/21/19   Rozetta Nunnery, MD     Allergies    Patient has no known allergies.   Review of Systems   Review of Systems A comprehensive review of systems was completed and negative except as noted in HPI.    Physical Exam BP (!) 142/87 (BP Location: Right Arm)   Pulse 71   Temp 98.5 F (36.9 C)   Resp 20   Ht 5\' 5"  (1.651 m)   Wt (!) 156.5 kg   SpO2 100%   BMI 57.41 kg/m   Physical Exam Vitals and nursing note reviewed.  Constitutional:      Appearance: She is obese.  HENT:     Head: Normocephalic.     Nose: Nose normal.  Eyes:     Extraocular Movements: Extraocular movements intact.  Pulmonary:     Effort: Pulmonary effort is normal.  Musculoskeletal:        General: Tenderness (L anterior shoulder) present. No deformity. Normal range of motion.     Cervical back: Neck supple.     Comments: Moderate pain with ROM of L shoulder, no muscle tenderness in L trapezius or cervical paraspinal muscles.  Skin:    Findings: No rash (on exposed skin).  Neurological:     Mental Status: She is alert and oriented to person, place, and time.  Psychiatric:        Mood  and Affect: Mood normal.      ED Results / Procedures / Treatments   Labs (all labs ordered are listed, but only abnormal results are displayed) Labs Reviewed - No data to display  EKG None   Radiology DG Shoulder Left  Result Date: 06/12/2020 CLINICAL DATA:  Left shoulder pain for 3 weeks.  No injury. EXAM: LEFT SHOULDER - 2+ VIEW COMPARISON:  None. FINDINGS: Increase in the subacromial space may indicate a left shoulder effusion. No evidence of acute  fracture or dislocation. No focal bone lesion or bone destruction. IMPRESSION: Possible left shoulder effusion. No acute fracture or dislocation. Electronically Signed   By: Lucienne Capers M.D.   On: 06/12/2020 01:42    Procedures Procedures  Medications Ordered in the ED Medications  ketorolac (TORADOL) injection 60 mg (60 mg Intramuscular Given 06/12/20 0054)     MDM Rules/Calculators/A&P MDM Patient's pain seems isolated to the L shoulder joint. Will check and xray. IM Toradol and reassess.   ED Course  I have reviewed the triage vital signs and the nursing notes.  Pertinent labs & imaging results that were available during my care of the patient were reviewed by me and considered in my medical decision making (see chart for details).  Clinical Course as of 06/12/20 0153  Tue Jun 12, 2020  0146 Xray results reviewed, no acute process. Questionable effusion. She does not have any fever or other signs of septic joint and given the duration of her symptoms this is not likely. Will provide a sling for comfort and plan discharge with NSAIDs, muscle relaxer and Ortho follow up.  [CS]    Clinical Course User Index [CS] Truddie Hidden, MD    Final Clinical Impression(s) / ED Diagnoses Final diagnoses:  Acute pain of left shoulder    Rx / DC Orders ED Discharge Orders         Ordered    naproxen (NAPROSYN) 500 MG tablet  2 times daily        06/12/20 0153    methocarbamol (ROBAXIN) 500 MG tablet  2 times daily         06/12/20 0153           Truddie Hidden, MD 06/12/20 [phone]

## 2020-08-03 DIAGNOSIS — Z9884 Bariatric surgery status: Secondary | ICD-10-CM | POA: Insufficient documentation

## 2020-08-08 DIAGNOSIS — E7800 Pure hypercholesterolemia, unspecified: Secondary | ICD-10-CM | POA: Insufficient documentation

## 2020-08-17 ENCOUNTER — Other Ambulatory Visit: Payer: Self-pay

## 2020-08-17 ENCOUNTER — Encounter (HOSPITAL_BASED_OUTPATIENT_CLINIC_OR_DEPARTMENT_OTHER): Payer: Self-pay

## 2020-08-17 ENCOUNTER — Emergency Department (HOSPITAL_BASED_OUTPATIENT_CLINIC_OR_DEPARTMENT_OTHER): Payer: BC Managed Care – PPO

## 2020-08-17 ENCOUNTER — Emergency Department (HOSPITAL_BASED_OUTPATIENT_CLINIC_OR_DEPARTMENT_OTHER)
Admission: EM | Admit: 2020-08-17 | Discharge: 2020-08-17 | Disposition: A | Discharge status: Home / Self Care | Payer: BC Managed Care – PPO | Attending: Emergency Medicine | Admitting: Emergency Medicine

## 2020-08-17 DIAGNOSIS — I509 Heart failure, unspecified: Secondary | ICD-10-CM | POA: Insufficient documentation

## 2020-08-17 DIAGNOSIS — M25562 Pain in left knee: Secondary | ICD-10-CM | POA: Diagnosis present

## 2020-08-17 DIAGNOSIS — J4520 Mild intermittent asthma, uncomplicated: Secondary | ICD-10-CM | POA: Diagnosis not present

## 2020-08-17 DIAGNOSIS — I110 Hypertensive heart disease with heart failure: Secondary | ICD-10-CM | POA: Diagnosis not present

## 2020-08-17 MED ORDER — KETOROLAC TROMETHAMINE 15 MG/ML IJ SOLN
15.0000 mg | Freq: Once | INTRAMUSCULAR | Status: AC
  Administered 2020-08-17: 15 mg via INTRAMUSCULAR
  Filled 2020-08-17: qty 1

## 2020-08-17 NOTE — Discharge Instructions (Addendum)
Take 4 over the counter ibuprofen tablets 3 times a day or 2 over-the-counter naproxen tablets twice a day for pain. Also take tylenol 1000mg(2 extra strength) four times a day.    

## 2020-08-17 NOTE — ED Provider Notes (Signed)
[facility] EMERGENCY DEPARTMENT Provider Note   CSN: [id number] Arrival date & time: 08/17/20  2114     History Chief Complaint  Patient presents with   Knee Pain    Melissa Erickson is a 40 y.o. female.  40 yo F with a chief complaints of about a week of left knee pain.  Denies trauma.  Pain worsening over the past couple days.  No fevers or chills.  No break in the skin.  The history is provided by the patient.  Knee Pain Location:  Knee Time since incident:  1 week Injury: no   Knee location:  L knee Pain details:    Quality:  Aching and sharp   Radiates to:  Does not radiate   Severity:  Moderate   Onset quality:  Gradual   Duration:  1 week   Timing:  Constant   Progression:  Worsening Chronicity:  New Prior injury to area:  No Relieved by:  Nothing Worsened by:  Adduction, abduction, activity, bearing weight, extension and flexion Ineffective treatments:  None tried Associated symptoms: no fever       Past Medical History:  Diagnosis Date   Allergy    Asthma    Chicken pox    Environmental allergies    Seasonal   Essential hypertension 07/18/2013   Gallstones    Heart attack ([place])    NSTEMI 07/2013, demand ischemia in setting of ARDS, post-gastric sleeve    History of congestive heart failure 2015   History of pneumonia    Migraine    Polycystic ovarian syndrome    Sleep apnea    Suppurative hidradenitis 07/21/2017   Ventral hernia     Patient Active Problem List   Diagnosis Date Noted   Leiomyoma of uterus, unspecified 07/26/2019   Menorrhagia 07/26/2019   Seasonal allergic rhinitis due to pollen 06/21/2018   PCOS (polycystic ovarian syndrome) 07/21/2017   Suppurative hidradenitis 07/21/2017   Gastric bypass status for obesity 07/21/2017   Spondylosis of lumbar region without myelopathy or radiculopathy 07/21/2017   Primary osteoarthritis of both hips 07/21/2017   History of obstructive sleep apnea 08/08/2013   NSTEMI (non-ST elevated  myocardial infarction), type 2 secondary to demand ischemia sue to sepsis 07/20/2013   History of ARDS 92/33/0076   Diastolic dysfunction without heart failure 07/18/2013   Asthma, mild intermittent 03/11/2013   Migraine 06/14/2012   Morbid obesity with BMI of 50.0-59.9, adult ([place]) 06/14/2012    Past Surgical History:  Procedure Laterality Date   BREAST BIOPSY Right 2001   Normal   CESAREAN SECTION  2007   CHOLECYSTECTOMY     ESOPHAGOGASTRODUODENOSCOPY N/A 07/19/2013   Procedure: ESOPHAGOGASTRODUODENOSCOPY (EGD);  Surgeon: Harl Bowie, MD;  Location: [place];  Service: General;  Laterality: N/A;   HYSTERECTOMY ABDOMINAL WITH SALPINGECTOMY N/A 07/26/2019   Procedure: Hysterectomy Abdominal With Salpingectomy;  Surgeon: Sanjuana Kava, MD;  Location: [place];  Service: Gynecology;  Laterality: N/A;   LAPAROSCOPIC GASTRIC SLEEVE RESECTION     LAPAROSCOPY N/A 07/19/2013   Procedure: LAPAROSCOPY DIAGNOSTIC;  Surgeon: Harl Bowie, MD;  Location: [place];  Service: General;  Laterality: N/A;   TOTAL LAPAROSCOPIC HYSTERECTOMY WITH SALPINGECTOMY Bilateral 07/26/2019   Procedure: Attempted TOTAL LAPAROSCOPIC HYSTERECTOMY WITH SALPINGECTOMY;  Surgeon: Sanjuana Kava, MD;  Location: [place];  Service: Gynecology;  Laterality: Bilateral;     OB History   No obstetric history on file.     Family History  Problem Relation Age of Onset  Diabetes Mother    Stroke Mother    Miscarriages / [place] Mother    Sarcoidosis Father    Alcohol abuse Father    Early death Father    Healthy Brother    Alzheimer's disease Maternal Grandmother    Heart attack Maternal Grandmother    Diabetes Maternal Grandfather    COPD Maternal Grandfather    Cancer Maternal Grandfather    Cancer Paternal Grandmother    Cerebral aneurysm Paternal Grandmother    Early death Paternal Grandmother    Cancer Paternal Grandfather    Allergic rhinitis Daughter     Social History   Tobacco Use   Smoking status: Never    Smokeless tobacco: Never  Vaping Use   Vaping Use: Never used  Substance Use Topics   Alcohol use: Yes    Comment: occ   Drug use: Never    Home Medications Prior to Admission medications   Medication Sig Start Date End Date Taking? Authorizing Provider  albuterol (VENTOLIN HFA) 108 (90 Base) MCG/ACT inhaler Inhale 2 puffs into the lungs every 4 (four) hours as needed for wheezing or shortness of breath. 02/23/19   Leamon Arnt, MD  ALPRAZolam Duanne Moron) 0.5 MG tablet Take 0.5 mg by mouth 3 (three) times daily as needed. 06/01/19   [provider]  cyclobenzaprine (FLEXERIL) 10 MG tablet Take 1 tablet (10 mg total) by mouth every 8 (eight) hours as needed for muscle spasms. 07/28/19   Sanjuana Kava, MD  ibuprofen (ADVIL) 800 MG tablet take 1 tablet po pc every 8 hours for 5 days then prn for pain 07/28/19   Sanjuana Kava, MD  methocarbamol (ROBAXIN) 500 MG tablet Take 1 tablet (500 mg total) by mouth 2 (two) times daily. 06/12/20   Truddie Hidden, MD  Multiple Vitamins-Minerals (MULTIVITAMIN WITH MINERALS) tablet Take 1 tablet by mouth daily.    [provider]  naproxen (NAPROSYN) 500 MG tablet Take 1 tablet (500 mg total) by mouth 2 (two) times daily. 06/12/20   Truddie Hidden, MD  oxyCODONE-acetaminophen (PERCOCET/ROXICET) 5-325 MG tablet take 1-2 tablets every 6 hours prn for breakthrough post-operative pain 07/28/19   Sanjuana Kava, MD  sertraline (ZOLOFT) 50 MG tablet Take 50 mg by mouth daily. 06/01/19   [provider]  SUMAtriptan (IMITREX) 25 MG tablet Take 1 tablet (25 mg total) by mouth once as needed for up to 1 dose for migraine. May repeat in 2 hours if headache persists or recurs. 02/23/19   Leamon Arnt, MD  triamcinolone (NASACORT) 55 MCG/ACT AERO nasal inhaler Place 2 sprays into the nose daily. Patient not taking: Reported on 07/14/2019 03/21/19   Rozetta Nunnery, MD    Allergies    Patient has no known allergies.  Review of Systems    Review of Systems  Constitutional:  Negative for chills and fever.  HENT:  Negative for congestion and rhinorrhea.   Eyes:  Negative for redness and visual disturbance.  Respiratory:  Negative for shortness of breath and wheezing.   Cardiovascular:  Negative for chest pain and palpitations.  Gastrointestinal:  Negative for nausea and vomiting.  Genitourinary:  Negative for dysuria and urgency.  Musculoskeletal:  Positive for arthralgias. Negative for myalgias.  Skin:  Negative for pallor and wound.  Neurological:  Negative for dizziness and headaches.   Physical Exam Updated Vital Signs BP (!) 133/96   Pulse 77   Temp 98.2 F (36.8 C) (Oral)   Resp 16   Ht 5'  4" (1.626 m)   Wt (!) 159.2 kg   LMP 07/22/2019   SpO2 96%   BMI 60.25 kg/m   Physical Exam Vitals and nursing note reviewed.  Constitutional:      General: She is not in acute distress.    Appearance: She is well-developed. She is not diaphoretic.     Comments: BMI 60  HENT:     Head: Normocephalic and atraumatic.  Eyes:     Pupils: Pupils are equal, round, and reactive to light.  Cardiovascular:     Rate and Rhythm: Normal rate and regular rhythm.     Heart sounds: No murmur heard.   No friction rub. No gallop.  Pulmonary:     Effort: Pulmonary effort is normal.     Breath sounds: No wheezing or rales.  Abdominal:     General: There is no distension.     Palpations: Abdomen is soft.     Tenderness: There is no abdominal tenderness.  Musculoskeletal:        General: Tenderness present.     Cervical back: Normal range of motion and neck supple.     Comments: Pain and swelling to the knee.  No erythema no warmth.  ROM with pain.   Skin:    General: Skin is warm and dry.  Neurological:     Mental Status: She is alert and oriented to person, place, and time.  Psychiatric:        Behavior: Behavior normal.    ED Results / Procedures / Treatments   Labs (all labs ordered are listed, but only abnormal  results are displayed) Labs Reviewed - No data to display  EKG None  Radiology DG Knee Complete 4 Views Left  Result Date: 08/17/2020 CLINICAL DATA:  Atraumatic left knee pain x1 week. EXAM: LEFT KNEE - COMPLETE 4+ VIEW COMPARISON:  None. FINDINGS: No evidence of fracture, dislocation, or joint effusion. Mild medial and lateral tibiofemoral compartment space narrowing is seen. Mild patellofemoral narrowing is also noted. Marginal osteophytes are seen involving the distal left femur and proximal right tibia. Soft tissues are unremarkable. IMPRESSION: Degenerative changes without an acute osseous abnormality. Electronically Signed   By: Virgina Norfolk M.D.   On: 08/17/2020 21:47    Procedures Procedures   Medications Ordered in ED Medications  ketorolac (TORADOL) 15 MG/ML injection 15 mg (has no administration in time range)    ED Course  I have reviewed the triage vital signs and the nursing notes.  Pertinent labs & imaging results that were available during my care of the patient were reviewed by me and considered in my medical decision making (see chart for details).    MDM Rules/Calculators/A&P                          40 yo F with a cc of L knee pain.  Atruamatic.  Xray without fx as viewed by me.  Immobilize.  PCP sports med follow up.   11:07 PM:  I have discussed the diagnosis/risks/treatment options with the patient and believe the pt to be eligible for discharge home to follow-up with PCP. We also discussed returning to the ED immediately if new or worsening sx occur. We discussed the sx which are most concerning (e.g., sudden worsening pain, fever, inability to tolerate by mouth) that necessitate immediate return. Medications administered to the patient during their visit and any new prescriptions provided to the patient are listed below.  Medications  given during this visit Medications  ketorolac (TORADOL) 15 MG/ML injection 15 mg (has no administration in time range)      The patient appears reasonably screen and/or stabilized for discharge and I doubt any other medical condition or other [facility] requiring further screening, evaluation, or treatment in the ED at this time prior to discharge.   Final Clinical Impression(s) / ED Diagnoses Final diagnoses:  Acute pain of left knee    Rx / DC Orders ED Discharge Orders     None        Deno Etienne, DO 08/17/20 2307

## 2020-08-17 NOTE — ED Triage Notes (Signed)
Pt c/o pain to left knee x 1 week-no known injury-slow gait-NAD

## 2020-09-05 ENCOUNTER — Ambulatory Visit: Payer: BC Managed Care – PPO | Admitting: Family Medicine

## 2020-09-05 NOTE — Progress Notes (Deleted)
Melissa Erickson - 40 y.o. female MRN [id number]  Date of birth: February 18, 1980  SUBJECTIVE:  Including CC & ROS.  No chief complaint on file.   Melissa Erickson is a 40 y.o. female that is  ***.  ***   Review of Systems See HPI   HISTORY: Past Medical, Surgical, Social, and Family History Reviewed & Updated per EMR.   Pertinent Historical Findings include:  Past Medical History:  Diagnosis Date   Allergy    Asthma    Chicken pox    Environmental allergies    Seasonal   Essential hypertension 07/18/2013   Gallstones    Heart attack ([place])    NSTEMI 07/2013, demand ischemia in setting of ARDS, post-gastric sleeve    History of congestive heart failure 2015   History of pneumonia    Migraine    Polycystic ovarian syndrome    Sleep apnea    Suppurative hidradenitis 07/21/2017   Ventral hernia     Past Surgical History:  Procedure Laterality Date   BREAST BIOPSY Right 2001   Normal   CESAREAN SECTION  2007   CHOLECYSTECTOMY     ESOPHAGOGASTRODUODENOSCOPY N/A 07/19/2013   Procedure: ESOPHAGOGASTRODUODENOSCOPY (EGD);  Surgeon: Harl Bowie, MD;  Location: [place];  Service: General;  Laterality: N/A;   HYSTERECTOMY ABDOMINAL WITH SALPINGECTOMY N/A 07/26/2019   Procedure: Hysterectomy Abdominal With Salpingectomy;  Surgeon: Sanjuana Kava, MD;  Location: [place];  Service: Gynecology;  Laterality: N/A;   LAPAROSCOPIC GASTRIC SLEEVE RESECTION     LAPAROSCOPY N/A 07/19/2013   Procedure: LAPAROSCOPY DIAGNOSTIC;  Surgeon: Harl Bowie, MD;  Location: [place];  Service: General;  Laterality: N/A;   TOTAL LAPAROSCOPIC HYSTERECTOMY WITH SALPINGECTOMY Bilateral 07/26/2019   Procedure: Attempted TOTAL LAPAROSCOPIC HYSTERECTOMY WITH SALPINGECTOMY;  Surgeon: Sanjuana Kava, MD;  Location: [place];  Service: Gynecology;  Laterality: Bilateral;    Family History  Problem Relation Age of Onset   Diabetes Mother    Stroke Mother    Miscarriages / [place] Mother    Sarcoidosis Father    Alcohol  abuse Father    Early death Father    Healthy Brother    Alzheimer's disease Maternal Grandmother    Heart attack Maternal Grandmother    Diabetes Maternal Grandfather    COPD Maternal Grandfather    Cancer Maternal Grandfather    Cancer Paternal Grandmother    Cerebral aneurysm Paternal Grandmother    Early death Paternal Grandmother    Cancer Paternal Grandfather    Allergic rhinitis Daughter     Social History   Socioeconomic History   Marital status: Single    Spouse name: Not on file   Number of children: Not on file   Years of education: Not on file   Highest education level: Not on file  Occupational History   Occupation: Human resources officer, call center positions    Employer: Teaching laboratory technician    Comment: [employer]  Tobacco Use   Smoking status: Never   Smokeless tobacco: Never  Vaping Use   Vaping Use: Never used  Substance and Sexual Activity   Alcohol use: Yes    Comment: occ   Drug use: Never   Sexual activity: Yes    Birth control/protection: None  Other Topics Concern   Not on file  Social History Narrative   Lives with long term boyfriend and daughter.   Social Determinants of Health   Financial Resource Strain: Not on file  Food Insecurity: Not on file  Transportation Needs: Not  on file  Physical Activity: Not on file  Stress: Not on file  Social Connections: Not on file  Intimate Partner Violence: Not on file     PHYSICAL EXAM:  VS: LMP 07/22/2019  Physical Exam Gen: NAD, alert, cooperative with exam, well-appearing MSK:  ***      ASSESSMENT & PLAN:   No problem-specific Assessment & Plan notes found for this encounter.

## 2020-11-07 DIAGNOSIS — R7301 Impaired fasting glucose: Secondary | ICD-10-CM | POA: Insufficient documentation

## 2021-01-10 ENCOUNTER — Other Ambulatory Visit: Payer: Self-pay

## 2021-01-10 ENCOUNTER — Emergency Department (HOSPITAL_BASED_OUTPATIENT_CLINIC_OR_DEPARTMENT_OTHER)
Admission: EM | Admit: 2021-01-10 | Discharge: 2021-01-10 | Disposition: A | Discharge status: Home / Self Care | Payer: BC Managed Care – PPO | Attending: Emergency Medicine | Admitting: Emergency Medicine

## 2021-01-10 ENCOUNTER — Encounter (HOSPITAL_BASED_OUTPATIENT_CLINIC_OR_DEPARTMENT_OTHER): Payer: Self-pay

## 2021-01-10 DIAGNOSIS — L02419 Cutaneous abscess of limb, unspecified: Secondary | ICD-10-CM

## 2021-01-10 DIAGNOSIS — J4520 Mild intermittent asthma, uncomplicated: Secondary | ICD-10-CM | POA: Insufficient documentation

## 2021-01-10 DIAGNOSIS — I10 Essential (primary) hypertension: Secondary | ICD-10-CM | POA: Diagnosis not present

## 2021-01-10 DIAGNOSIS — L02411 Cutaneous abscess of right axilla: Secondary | ICD-10-CM | POA: Insufficient documentation

## 2021-01-10 MED ORDER — LIDOCAINE-EPINEPHRINE (PF) 2 %-1:200000 IJ SOLN
10.0000 mL | Freq: Once | INTRAMUSCULAR | Status: AC
  Administered 2021-01-10: 10 mL
  Filled 2021-01-10: qty 20

## 2021-01-10 NOTE — ED Triage Notes (Signed)
Pt reports potentially 2 abscesses in right axillary region. They were draining 2 weeks ago but have stopped and became hard.

## 2021-01-10 NOTE — ED Provider Notes (Signed)
[facility] EMERGENCY DEPT Provider Note   CSN: [id number] Arrival date & time: 01/10/21  1953     History Chief Complaint  Patient presents with   Abscess    Melissa Erickson is a 40 y.o. female.  Patient with history of hidradenitis presents to the emergency department today for evaluation of abscess in the right axilla.  She states that these areas previously had been draining but then the drainage stopped and she has developed 2 areas of tenderness and swelling that are now "rubbing against each other".  No fevers, nausea or vomiting.  She has had these lanced in the past and typically needs that before they improve.  No other treatments prior to arrival.      Past Medical History:  Diagnosis Date   Allergy    Asthma    Chicken pox    Environmental allergies    Seasonal   Essential hypertension 07/18/2013   Gallstones    Heart attack ([place])    NSTEMI 07/2013, demand ischemia in setting of ARDS, post-gastric sleeve    History of congestive heart failure 2015   History of pneumonia    Migraine    Polycystic ovarian syndrome    Sleep apnea    Suppurative hidradenitis 07/21/2017   Ventral hernia     Patient Active Problem List   Diagnosis Date Noted   Leiomyoma of uterus, unspecified 07/26/2019   Menorrhagia 07/26/2019   Seasonal allergic rhinitis due to pollen 06/21/2018   PCOS (polycystic ovarian syndrome) 07/21/2017   Suppurative hidradenitis 07/21/2017   Gastric bypass status for obesity 07/21/2017   Spondylosis of lumbar region without myelopathy or radiculopathy 07/21/2017   Primary osteoarthritis of both hips 07/21/2017   History of obstructive sleep apnea 08/08/2013   NSTEMI (non-ST elevated myocardial infarction), type 2 secondary to demand ischemia sue to sepsis 07/20/2013   History of ARDS 31/51/7616   Diastolic dysfunction without heart failure 07/18/2013   Asthma, mild intermittent 03/11/2013   Migraine 06/14/2012   Morbid obesity with BMI  of 50.0-59.9, adult ([place]) 06/14/2012    Past Surgical History:  Procedure Laterality Date   BREAST BIOPSY Right 2001   Normal   CESAREAN SECTION  2007   CHOLECYSTECTOMY     ESOPHAGOGASTRODUODENOSCOPY N/A 07/19/2013   Procedure: ESOPHAGOGASTRODUODENOSCOPY (EGD);  Surgeon: Harl Bowie, MD;  Location: [place];  Service: General;  Laterality: N/A;   HYSTERECTOMY ABDOMINAL WITH SALPINGECTOMY N/A 07/26/2019   Procedure: Hysterectomy Abdominal With Salpingectomy;  Surgeon: Sanjuana Kava, MD;  Location: [place];  Service: Gynecology;  Laterality: N/A;   LAPAROSCOPIC GASTRIC SLEEVE RESECTION     LAPAROSCOPY N/A 07/19/2013   Procedure: LAPAROSCOPY DIAGNOSTIC;  Surgeon: Harl Bowie, MD;  Location: [place];  Service: General;  Laterality: N/A;   TOTAL LAPAROSCOPIC HYSTERECTOMY WITH SALPINGECTOMY Bilateral 07/26/2019   Procedure: Attempted TOTAL LAPAROSCOPIC HYSTERECTOMY WITH SALPINGECTOMY;  Surgeon: Sanjuana Kava, MD;  Location: [place];  Service: Gynecology;  Laterality: Bilateral;     OB History   No obstetric history on file.     Family History  Problem Relation Age of Onset   Diabetes Mother    Stroke Mother    Miscarriages / [place] Mother    Sarcoidosis Father    Alcohol abuse Father    Early death Father    Healthy Brother    Alzheimer's disease Maternal Grandmother    Heart attack Maternal Grandmother    Diabetes Maternal Grandfather    COPD Maternal Grandfather    Cancer  Maternal Grandfather    Cancer Paternal Grandmother    Cerebral aneurysm Paternal Grandmother    Early death Paternal Grandmother    Cancer Paternal Grandfather    Allergic rhinitis Daughter     Social History   Tobacco Use   Smoking status: Never   Smokeless tobacco: Never  Vaping Use   Vaping Use: Never used  Substance Use Topics   Alcohol use: Yes    Comment: occ   Drug use: Never    Home Medications Prior to Admission medications   Medication Sig Start Date End Date Taking? Authorizing  Provider  albuterol (VENTOLIN HFA) 108 (90 Base) MCG/ACT inhaler Inhale 2 puffs into the lungs every 4 (four) hours as needed for wheezing or shortness of breath. 02/23/19   Leamon Arnt, MD  ALPRAZolam Duanne Moron) 0.5 MG tablet Take 0.5 mg by mouth 3 (three) times daily as needed. 06/01/19   [provider]  cyclobenzaprine (FLEXERIL) 10 MG tablet Take 1 tablet (10 mg total) by mouth every 8 (eight) hours as needed for muscle spasms. 07/28/19   Sanjuana Kava, MD  ibuprofen (ADVIL) 800 MG tablet take 1 tablet po pc every 8 hours for 5 days then prn for pain 07/28/19   Sanjuana Kava, MD  methocarbamol (ROBAXIN) 500 MG tablet Take 1 tablet (500 mg total) by mouth 2 (two) times daily. 06/12/20   Truddie Hidden, MD  Multiple Vitamins-Minerals (MULTIVITAMIN WITH MINERALS) tablet Take 1 tablet by mouth daily.    [provider]  naproxen (NAPROSYN) 500 MG tablet Take 1 tablet (500 mg total) by mouth 2 (two) times daily. 06/12/20   Truddie Hidden, MD  oxyCODONE-acetaminophen (PERCOCET/ROXICET) 5-325 MG tablet take 1-2 tablets every 6 hours prn for breakthrough post-operative pain 07/28/19   Sanjuana Kava, MD  sertraline (ZOLOFT) 50 MG tablet Take 50 mg by mouth daily. 06/01/19   [provider]  SUMAtriptan (IMITREX) 25 MG tablet Take 1 tablet (25 mg total) by mouth once as needed for up to 1 dose for migraine. May repeat in 2 hours if headache persists or recurs. 02/23/19   Leamon Arnt, MD  triamcinolone (NASACORT) 55 MCG/ACT AERO nasal inhaler Place 2 sprays into the nose daily. Patient not taking: Reported on 07/14/2019 03/21/19   Rozetta Nunnery, MD    Allergies    Patient has no known allergies.  Review of Systems   Review of Systems  Constitutional:  Negative for fever.  Gastrointestinal:  Negative for nausea and vomiting.  Skin:  Negative for color change.       Positive for abscess.  Hematological:  Negative for adenopathy.   Physical Exam Updated Vital Signs BP  (!) 152/78 (BP Location: Left Arm)   Pulse 93   Temp 98.5 F (36.9 C) (Oral)   Resp 16   Ht 5\' 4"  (1.626 m)   Wt (!) 154.2 kg   LMP 07/22/2019   SpO2 98%   BMI 58.36 kg/m   Physical Exam Vitals and nursing note reviewed.  Constitutional:      Appearance: She is well-developed.  HENT:     Head: Normocephalic and atraumatic.  Eyes:     Conjunctiva/sclera: Conjunctivae normal.  Pulmonary:     Effort: No respiratory distress.  Musculoskeletal:     Cervical back: Normal range of motion and neck supple.  Skin:    General: Skin is warm and dry.     Comments: Patient with 2, minimally fluctuant areas of induration in the right  axilla, without overlying erythema.  No active drainage.  They are each approximately 1 cm in diameter.  Mild tenderness with palpation over the induration.  Neurological:     Mental Status: She is alert.    ED Results / Procedures / Treatments   Labs (all labs ordered are listed, but only abnormal results are displayed) Labs Reviewed - No data to display  EKG None  Radiology No results found.  Procedures .Marland KitchenIncision and Drainage  Date/Time: 01/10/2021 9:31 PM Performed by: Carlisle Cater, PA-C Authorized by: Carlisle Cater, PA-C   Consent:    Consent obtained:  Verbal   Consent given by:  Patient   Risks discussed:  Bleeding and pain   Alternatives discussed:  No treatment Universal protocol:    Patient identity confirmed:  Verbally with patient Location:    Type:  Abscess   Size:  2cm   Location:  Trunk   Trunk location: Right axilla. Anesthesia:    Anesthesia method:  Local infiltration   Local anesthetic:  Lidocaine 2% WITH epi Procedure type:    Complexity:  Simple Procedure details:    Incision types:  Single straight   Wound management:  Probed and deloculated   Drainage:  Purulent   Drainage amount:  Moderate   Wound treatment:  Wound left open   Packing materials:  None Post-procedure details:    Procedure completion:   Tolerated well, no immediate complications   Medications Ordered in ED Medications  lidocaine-EPINEPHrine (XYLOCAINE W/EPI) 2 %-1:200000 (PF) injection 10 mL (has no administration in time range)    ED Course  I have reviewed the triage vital signs and the nursing notes.  Pertinent labs & imaging results that were available during my care of the patient were reviewed by me and considered in my medical decision making (see chart for details).  Patient seen and examined. Plan discussed with patient.  Offered treatment with p.o. antibiotics and warm compresses, versus attempted I&D today.  I am not confident that we will be able to express a lot of purulent material given that the areas are minimally fluctuant.  Patient would like me to attempt I&D.  We will proceed.  Vital signs reviewed and are as follows: BP (!) 152/78 (BP Location: Left Arm)   Pulse 93   Temp 98.5 F (36.9 C) (Oral)   Resp 16   Ht 5\' 4"  (1.626 m)   Wt (!) 154.2 kg   LMP 07/22/2019   SpO2 98%   BMI 58.36 kg/m   9:30 PM I&D performed.  There was actually good return of purulent drainage.  Pt tolerated procedure well.   The patient was urged to return to the Emergency Department urgently with worsening pain, swelling, expanding erythema especially if it streaks away from the affected area, fever, or if they have any other concerns.   The patient was urged to return to the Emergency Department or go to their PCP in 48 hours for wound recheck if the area is not significantly improved.  The patient verbalized understanding and stated agreement with this plan.     MDM Rules/Calculators/A&P                           Patient with right axillary abscess, I&D performed with good results.  Patient nontoxic.  We will continue warm compresses and supportive care at home.  Do not feel that she requires antibiotic given lack of associated cellulitis or other systemic symptoms.  Final Clinical Impression(s) / ED  Diagnoses Final diagnoses:  Axillary abscess    Rx / DC Orders ED Discharge Orders     None        Suann Larry 01/10/21 2132    Truddie Hidden, MD 01/10/21 2320

## 2021-01-10 NOTE — Discharge Instructions (Signed)
Please read and follow all provided instructions.  Your diagnoses today include:  1. Axillary abscess    Tests performed today include: Vital signs. See below for your results today.   Medications prescribed:  None  Take any prescribed medications only as directed.   Home care instructions:  Follow any educational materials contained in this packet  Follow-up instructions: Return to the Emergency Department in 48 hours for a recheck if your symptoms are not significantly improved.  Please follow-up with your primary care provider in the next 1 week for further evaluation of your symptoms.   Return instructions:  Return to the Emergency Department if you have: Fever Worsening symptoms Worsening pain Worsening swelling Redness of the skin that moves away from the affected area, especially if it streaks away from the affected area  Any other emergent concerns  Your vital signs today were: BP (!) 142/97   Pulse 87   Temp 98.5 F (36.9 C) (Oral)   Resp 17   Ht 5\' 4"  (1.626 m)   Wt (!) 154.2 kg   LMP 07/22/2019   SpO2 95%   BMI 58.36 kg/m  If your blood pressure (BP) was elevated above 135/85 this visit, please have this repeated by your doctor within one month. --------------

## 2021-06-03 ENCOUNTER — Encounter (HOSPITAL_BASED_OUTPATIENT_CLINIC_OR_DEPARTMENT_OTHER): Payer: Self-pay

## 2021-06-12 ENCOUNTER — Ambulatory Visit: Payer: BC Managed Care – PPO | Admitting: Family Medicine

## 2021-06-19 ENCOUNTER — Encounter: Payer: Self-pay | Admitting: Family Medicine

## 2021-06-19 ENCOUNTER — Ambulatory Visit: Payer: BC Managed Care – PPO | Admitting: Family Medicine

## 2021-06-19 VITALS — BP 100/84 | HR 82 | Temp 98.3°F | Ht 64.0 in | Wt 359.6 lb

## 2021-06-19 DIAGNOSIS — Z6841 Body Mass Index (BMI) 40.0 and over, adult: Secondary | ICD-10-CM

## 2021-06-19 DIAGNOSIS — Z9884 Bariatric surgery status: Secondary | ICD-10-CM | POA: Diagnosis not present

## 2021-06-19 DIAGNOSIS — Z8669 Personal history of other diseases of the nervous system and sense organs: Secondary | ICD-10-CM | POA: Diagnosis not present

## 2021-06-19 DIAGNOSIS — Z8709 Personal history of other diseases of the respiratory system: Secondary | ICD-10-CM

## 2021-06-19 DIAGNOSIS — I5189 Other ill-defined heart diseases: Secondary | ICD-10-CM | POA: Diagnosis not present

## 2021-06-19 DIAGNOSIS — F411 Generalized anxiety disorder: Secondary | ICD-10-CM

## 2021-06-19 LAB — LIPID PANEL
Cholesterol: 208 mg/dL — ABNORMAL HIGH (ref 0–200)
HDL: 40.1 mg/dL (ref >39.00)
LDL Cholesterol: 147 mg/dL — ABNORMAL HIGH (ref 0–99)
NonHDL: 167.77
Total CHOL/HDL Ratio: 5
Triglycerides: 102 mg/dL (ref 0.0–149.0)
VLDL: 20.4 mg/dL (ref 0.0–40.0)

## 2021-06-19 LAB — COMPREHENSIVE METABOLIC PANEL
ALT: 18 U/L (ref 0–35)
AST: 16 U/L (ref 0–37)
Albumin: 3.8 g/dL (ref 3.5–5.2)
Alkaline Phosphatase: 61 U/L (ref 39–117)
BUN: 12 mg/dL (ref 6–23)
CO2: 25 mEq/L (ref 19–32)
Calcium: 8.8 mg/dL (ref 8.4–10.5)
Chloride: 105 mEq/L (ref 96–112)
Creatinine, Ser: 0.76 mg/dL (ref 0.40–1.20)
GFR: 96.63 mL/min (ref >60.00)
Glucose, Bld: 92 mg/dL (ref 70–99)
Potassium: 3.7 mEq/L (ref 3.5–5.1)
Sodium: 139 mEq/L (ref 135–145)
Total Bilirubin: 0.5 mg/dL (ref 0.2–1.2)
Total Protein: 7.2 g/dL (ref 6.0–8.3)

## 2021-06-19 LAB — CBC WITH DIFFERENTIAL/PLATELET
Basophils Absolute: 0 10*3/uL (ref 0.0–0.1)
Basophils Relative: 0.8 % (ref 0.0–3.0)
Eosinophils Absolute: 0.1 10*3/uL (ref 0.0–0.7)
Eosinophils Relative: 1 % (ref 0.0–5.0)
HCT: 41.4 % (ref 36.0–46.0)
Hemoglobin: 13.5 g/dL (ref 12.0–15.0)
Lymphocytes Relative: 29.3 % (ref 12.0–46.0)
Lymphs Abs: 1.5 10*3/uL (ref 0.7–4.0)
MCHC: 32.5 g/dL (ref 30.0–36.0)
MCV: 84.3 fl (ref 78.0–100.0)
Monocytes Absolute: 0.5 10*3/uL (ref 0.1–1.0)
Monocytes Relative: 8.7 % (ref 3.0–12.0)
Neutro Abs: 3.2 10*3/uL (ref 1.4–7.7)
Neutrophils Relative %: 60.2 % (ref 43.0–77.0)
Platelets: 186 10*3/uL (ref 150.0–400.0)
RBC: 4.91 Mil/uL (ref 3.87–5.11)
RDW: 14.7 % (ref 11.5–15.5)
WBC: 5.2 10*3/uL (ref 4.0–10.5)

## 2021-06-19 LAB — VITAMIN D 25 HYDROXY (VIT D DEFICIENCY, FRACTURES): VITD: 14.09 ng/mL — ABNORMAL LOW (ref 30.00–100.00)

## 2021-06-19 LAB — TSH: TSH: 1.07 u[IU]/mL (ref 0.35–5.50)

## 2021-06-19 MED ORDER — SERTRALINE HCL 100 MG PO TABS: 100.0000 mg | ORAL_TABLET | Freq: Every day | ORAL | 3 refills | Status: DC

## 2021-06-19 NOTE — Patient Instructions (Signed)
Please return in 6 weeks to recheck mood and sleep.  ? ?I will release your lab results to you on your MyChart account with further instructions. You may see the results before I do, but when I review them I will send you a message with my report or have my assistant call you if things need to be discussed. Please reply to my message with any questions. Thank you!  ? ?If you have any questions or concerns, please don't hesitate to send me a message via MyChart or call the office at [phone]. Thank you for visiting with [place] today! It's our pleasure caring for you.  ?

## 2021-06-19 NOTE — Progress Notes (Signed)
? ? ?Subjective  ?CC:  ?Chief Complaint  ?Patient presents with  ? Lab work  ?  Pt stated that she feels that she needs lab work done for iron and Vit d bc she is always tired. Also feels like the Zoloft is not working bc she will wake up in the middle of the night.  ? ? ?HPI: Melissa Erickson is a 41 y.o. female who presents to the office today to address the problems listed above in the chief complaint. ?Last here in 2021.  Has been following with OB.  Status post hysterectomy for menorrhagia and fibroids.  Currently here because feeling tired.  History of gastric bypass.  Weight has increased again.  Has history of vitamin deficiencies.  Sleep is intermittent, currently disturbed with anxiety and worry but also has history of obstructive apnea.  No witnessed apnea.  No chest pain or shortness of breath.  Works full-time.  Mother 77 year old daughter.  Positive depression and anxiety screens.  Will be had placed her on Zoloft about a year and a half ago.  Initially she had improvement in sleep and decrease anxiety symptoms.  However symptoms have resurfaced.  No panic.  No suicidal ideation.  No longer using Xanax. ? ? ?  06/19/2021  ?  8:35 AM 01/21/2019  ?  8:34 AM 07/21/2017  ?  2:07 PM  ?Depression screen PHQ 2/9  ?Decreased Interest 2 0 0  ?Down, Depressed, Hopeless 1 0 0  ?PHQ - 2 Score 3 0 0  ?Altered sleeping 3    ?Tired, decreased energy 3    ?Change in appetite 1    ?Feeling bad or failure about yourself  1    ?Trouble concentrating 2    ?Moving slowly or fidgety/restless 0    ?Suicidal thoughts 0    ?PHQ-9 Score 13    ?Difficult doing work/chores Somewhat difficult    ? ? ?  06/19/2021  ?  8:35 AM  ?GAD 7 : Generalized Anxiety Score  ?Nervous, Anxious, on Edge 2  ?Control/stop worrying 2  ?Worry too much - different things 2  ?Trouble relaxing 3  ?Restless 0  ?Easily annoyed or irritable 2  ?Afraid - awful might happen 0  ?Total GAD 7 Score 11  ?Anxiety Difficulty Somewhat difficult  ? ? ? ?Assessment  ?1.  Gastric bypass status for obesity   ?2. Morbid obesity with BMI of 50.0-59.9, adult ([place])   ?3. History of obstructive sleep apnea   ?4. Diastolic dysfunction without heart failure   ?5. GAD (generalized anxiety disorder)   ? ?  ?Plan  ?History of gastric bypass and morbid obesity: Recheck vitamin levels.  Check thyroid levels.  Check for anemia.  Recommend healthy diet.  Consider retesting for sleep apnea ?Anxiety disorder: Increase Zoloft to 100 mg.  Counseling done.  Follow-up 6 weeks for recheck.  Monitor sleep.  May need sleep medicine. ?History of heart failure.  No lower extremity edema or chest pain now. ? ?Follow up: 6 weeks to recheck mood ? ? ?Orders Placed This Encounter  ?Procedures  ? VITAMIN D 25 Hydroxy (Vit-D Deficiency, Fractures)  ? Iron, TIBC and Ferritin Panel  ? CBC with Differential/Platelet  ? TSH  ? Comprehensive metabolic panel  ? Lipid panel  ? ?Meds ordered this encounter  ?Medications  ? sertraline (ZOLOFT) 100 MG tablet  ?  Sig: Take 1 tablet (100 mg total) by mouth daily.  ?  Dispense:  90 tablet  ?  Refill:  3  ? ?  ? ?I reviewed the patients updated PMH, FH, and SocHx.  ?  ?Patient Active Problem List  ? Diagnosis Date Noted  ? Gastric bypass status for obesity 07/21/2017  ?  Priority: High  ? History of obstructive sleep apnea 08/08/2013  ?  Priority: High  ? Morbid obesity with BMI of 50.0-59.9, adult ([name]) 06/14/2012  ?  Priority: High  ? PCOS (polycystic ovarian syndrome) 07/21/2017  ?  Priority: Medium   ? Suppurative hidradenitis 07/21/2017  ?  Priority: Medium   ? Spondylosis of lumbar region without myelopathy or radiculopathy 07/21/2017  ?  Priority: Medium   ? Primary osteoarthritis of both hips 07/21/2017  ?  Priority: Medium   ? NSTEMI (non-ST elevated myocardial infarction), type 2 secondary to demand ischemia sue to sepsis 07/20/2013  ?  Priority: Medium   ? History of ARDS 07/18/2013  ?  Priority: Medium   ? Diastolic dysfunction without heart failure 07/18/2013  ?   Priority: Medium   ? Asthma, mild intermittent 03/11/2013  ?  Priority: Medium   ? Migraine 06/14/2012  ?  Priority: Medium   ? Seasonal allergic rhinitis due to pollen 06/21/2018  ?  Priority: Low  ? GAD (generalized anxiety disorder) 06/19/2021  ? Leiomyoma of uterus, unspecified 07/26/2019  ? Menorrhagia 07/26/2019  ? ?Current Meds  ?Medication Sig  ? albuterol (VENTOLIN HFA) 108 (90 Base) MCG/ACT inhaler Inhale 2 puffs into the lungs every 4 (four) hours as needed for wheezing or shortness of breath.  ? ALPRAZolam (XANAX) 0.5 MG tablet Take 0.5 mg by mouth 3 (three) times daily as needed.  ? cyclobenzaprine (FLEXERIL) 10 MG tablet Take 1 tablet (10 mg total) by mouth every 8 (eight) hours as needed for muscle spasms.  ? methocarbamol (ROBAXIN) 500 MG tablet Take 1 tablet (500 mg total) by mouth 2 (two) times daily.  ? Multiple Vitamins-Minerals (MULTIVITAMIN WITH MINERALS) tablet Take 1 tablet by mouth daily.  ? naproxen (NAPROSYN) 500 MG tablet Take 1 tablet (500 mg total) by mouth 2 (two) times daily.  ? triamcinolone (NASACORT) 55 MCG/ACT AERO nasal inhaler Place 2 sprays into the nose daily.  ? [DISCONTINUED] ibuprofen (ADVIL) 800 MG tablet take 1 tablet po pc every 8 hours for 5 days then prn for pain  ? [DISCONTINUED] sertraline (ZOLOFT) 50 MG tablet Take 50 mg by mouth daily.  ? ? ?Allergies: ?Patient has No Known Allergies. ?Family History: ?Patient family history includes Alcohol abuse in her father; Allergic rhinitis in her daughter; Alzheimer's disease in her maternal grandmother; COPD in her maternal grandfather; Cancer in her maternal grandfather, paternal grandfather, and paternal grandmother; Cerebral aneurysm in her paternal grandmother; Diabetes in her maternal grandfather and mother; Early death in her father and paternal grandmother; Healthy in her brother; Heart attack in her maternal grandmother; Miscarriages / [place] in her mother; Sarcoidosis in her father; Stroke in her  mother. ?Social History:  ?Patient  reports that she has never smoked. She has never used smokeless tobacco. She reports current alcohol use. She reports that she does not use drugs. ? ?Review of Systems: ?Constitutional: Negative for fever malaise or anorexia ?Cardiovascular: negative for chest pain ?Respiratory: negative for SOB or persistent cough ?Gastrointestinal: negative for abdominal pain ? ?Objective  ?Vitals: BP 100/84   Pulse 82   Temp 98.3 ?F (36.8 ?C)   Ht '5\' 4"'$  (1.626 m)   Wt (!) 359 lb 9.6 oz (163.1 kg)   LMP 07/22/2019  SpO2 97%   BMI 61.73 kg/m?  ?General: no acute distress , A&Ox3 ?HEENT: PEERL, conjunctiva normal, neck is supple ?Cardiovascular:  RRR without murmur or gallop.  ?Respiratory:  Good breath sounds bilaterally, CTAB with normal respiratory effort ?Skin:  Warm, no rashes ? ? ? ?Commons side effects, risks, benefits, and alternatives for medications and treatment plan prescribed today were discussed, and the patient expressed understanding of the given instructions. Patient is instructed to call or message via MyChart if he/she has any questions or concerns regarding our treatment plan. No barriers to understanding were identified. We discussed Red Flag symptoms and signs in detail. Patient expressed understanding regarding what to do in case of urgent or emergency type symptoms.  ?Medication list was reconciled, printed and provided to the patient in AVS. Patient instructions and summary information was reviewed with the patient as documented in the AVS. ?This note was prepared with assistance of Systems analyst. Occasional wrong-word or sound-a-like substitutions may have occurred due to the inherent limitations of voice recognition software ? ?This visit occurred during the SARS-CoV-2 public health emergency.  Safety protocols were in place, including screening questions prior to the visit, additional usage of staff PPE, and extensive cleaning of exam room  while observing appropriate contact time as indicated for disinfecting solutions.  ? ?

## 2021-06-20 LAB — IRON,TIBC AND FERRITIN PANEL
%SAT: 14 % (calc) — ABNORMAL LOW (ref 16–45)
Ferritin: 12 ng/mL — ABNORMAL LOW (ref 16–232)
Iron: 48 ug/dL (ref 40–190)
TIBC: 341 mcg/dL (calc) (ref 250–450)

## 2021-06-21 ENCOUNTER — Telehealth: Payer: Self-pay

## 2021-06-21 NOTE — Telephone Encounter (Signed)
Spoke with pt regarding lab results/recommendations ?

## 2021-06-28 ENCOUNTER — Other Ambulatory Visit: Payer: Self-pay | Admitting: *Deleted

## 2021-06-28 ENCOUNTER — Telehealth: Payer: Self-pay

## 2021-06-28 MED ORDER — VITAMIN D (ERGOCALCIFEROL) 1.25 MG (50000 UNIT) PO CAPS: 50000.0000 [IU] | ORAL_CAPSULE | ORAL | 1 refills | Status: AC

## 2021-06-28 NOTE — Telephone Encounter (Signed)
Rx sent to the pharmacy. Patient notified.  ?

## 2021-06-28 NOTE — Telephone Encounter (Signed)
Patient states she was told rx for Vit D was sent to [facility] [address].   Patient states pharmacy does not have script.  Please follow up in regard.

## 2021-07-03 IMAGING — DX DG KNEE COMPLETE 4+V*L*
4 series · 4 of 4 positions shown · non-contrast
Comparison: None.

CLINICAL DATA: Atraumatic left knee pain x1 week.

EXAM:
LEFT KNEE - COMPLETE 4+ VIEW

[knee ap]
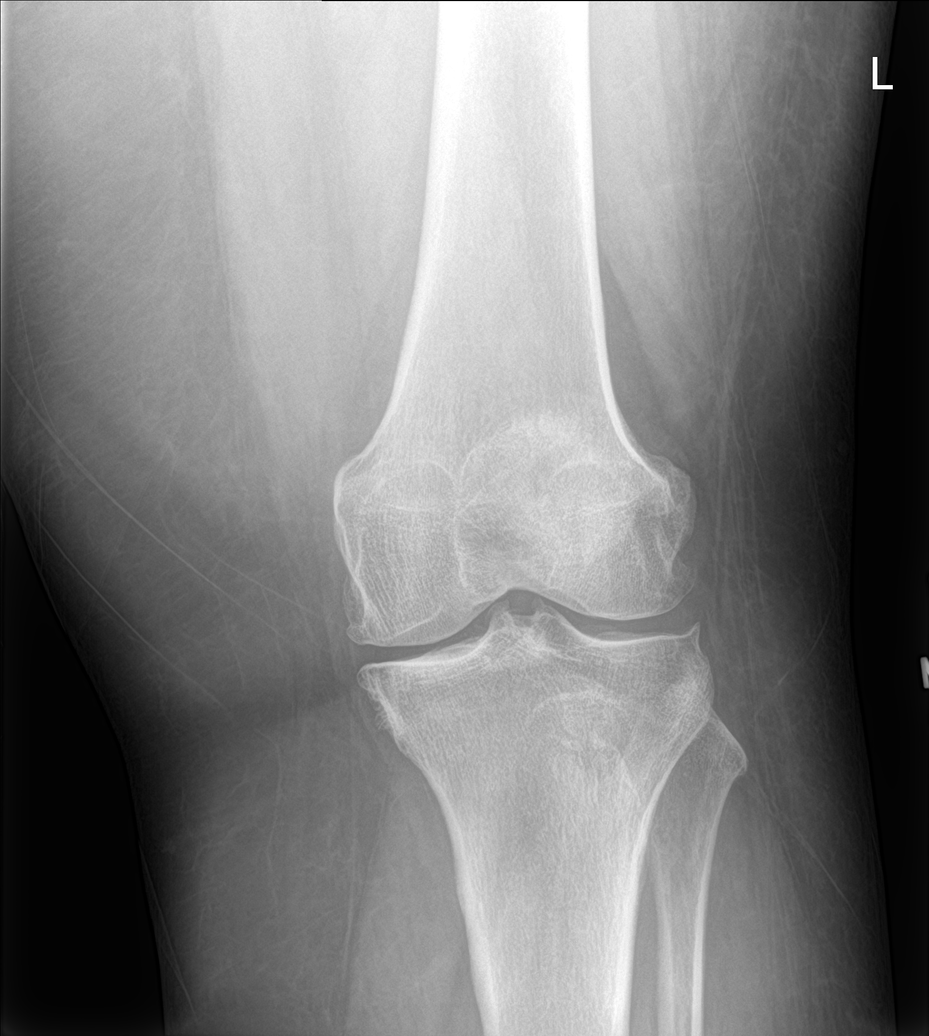

[knee lat]
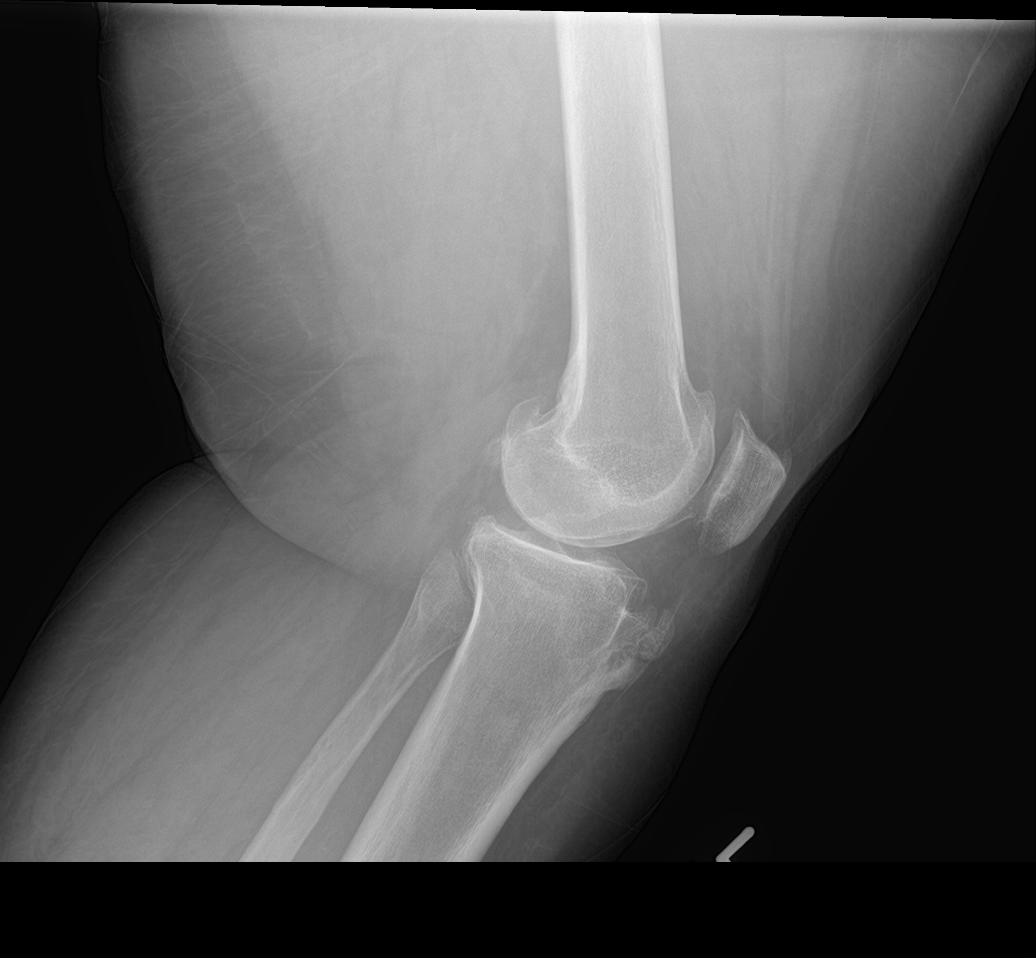

[knee obl (1 of 2)]
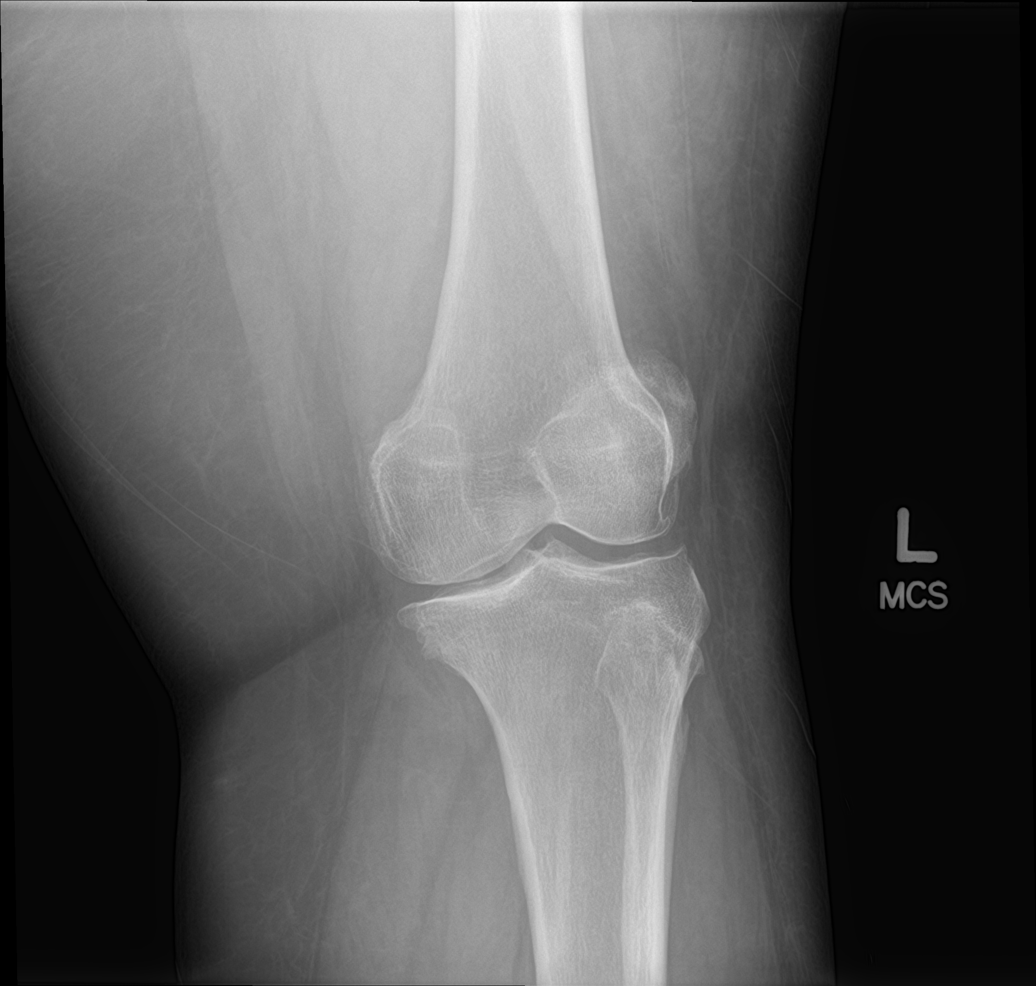

[knee obl (2 of 2)]
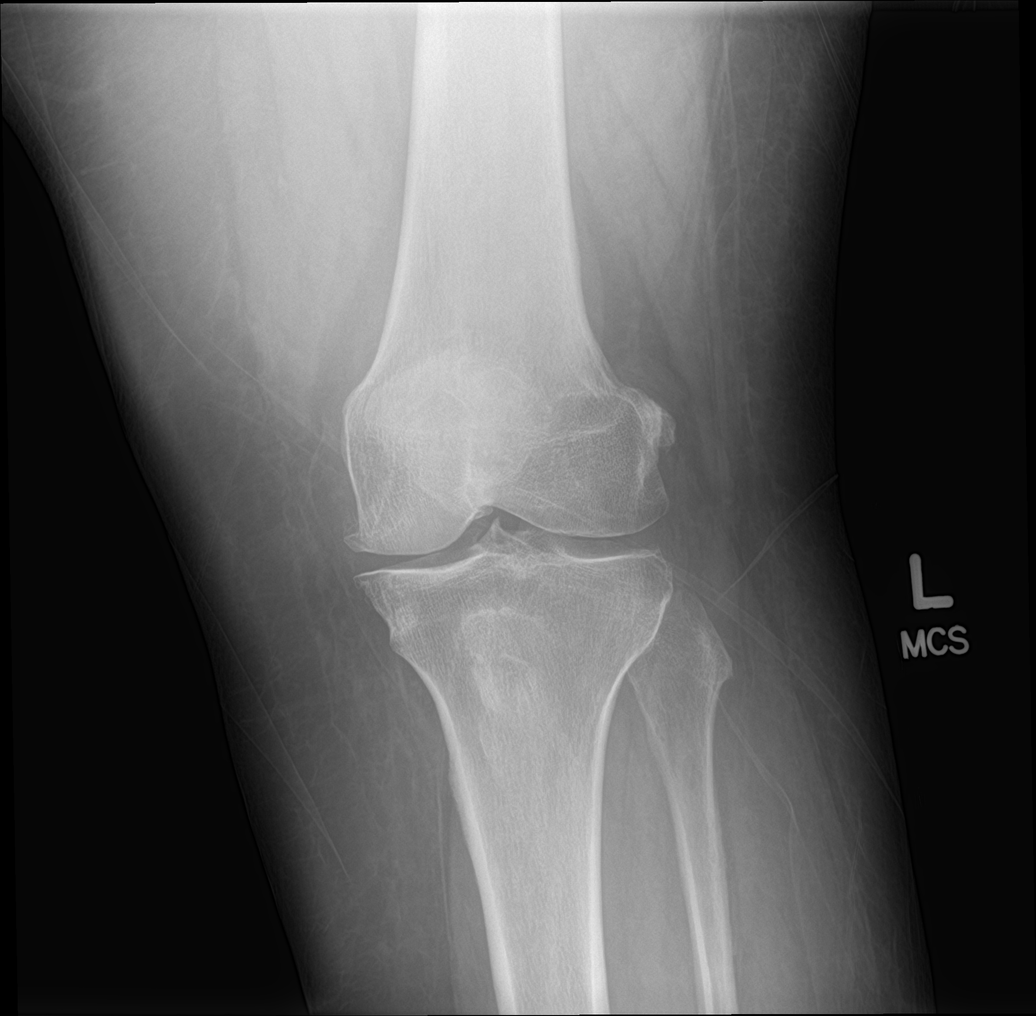

[4 of 4 positions shown; findings below may reference images not displayed]

FINDINGS: No evidence of fracture, dislocation, or joint effusion. Mild medial
and lateral tibiofemoral compartment space narrowing is seen. Mild
patellofemoral narrowing is also noted. Marginal osteophytes are
seen involving the distal left femur and proximal right tibia. Soft
tissues are unremarkable.
IMPRESSION: Degenerative changes without an acute osseous abnormality.

## 2021-08-09 ENCOUNTER — Encounter: Payer: Self-pay | Admitting: Family Medicine

## 2021-08-09 ENCOUNTER — Ambulatory Visit (INDEPENDENT_AMBULATORY_CARE_PROVIDER_SITE_OTHER): Payer: BC Managed Care – PPO | Admitting: Family Medicine

## 2021-08-09 VITALS — BP 104/70 | HR 78 | Temp 98.2°F | Ht 64.0 in | Wt 351.6 lb

## 2021-08-09 DIAGNOSIS — Z6841 Body Mass Index (BMI) 40.0 and over, adult: Secondary | ICD-10-CM

## 2021-08-09 DIAGNOSIS — F411 Generalized anxiety disorder: Secondary | ICD-10-CM | POA: Diagnosis not present

## 2021-08-09 DIAGNOSIS — E559 Vitamin D deficiency, unspecified: Secondary | ICD-10-CM

## 2021-08-09 DIAGNOSIS — E611 Iron deficiency: Secondary | ICD-10-CM

## 2021-08-23 ENCOUNTER — Encounter: Payer: Self-pay | Admitting: Family Medicine

## 2021-08-23 ENCOUNTER — Ambulatory Visit (INDEPENDENT_AMBULATORY_CARE_PROVIDER_SITE_OTHER): Payer: BC Managed Care – PPO | Admitting: Family Medicine

## 2021-08-23 VITALS — BP 108/60 | HR 84 | Temp 97.7°F | Ht 64.0 in | Wt 349.2 lb

## 2021-08-23 DIAGNOSIS — E611 Iron deficiency: Secondary | ICD-10-CM

## 2021-08-23 DIAGNOSIS — E559 Vitamin D deficiency, unspecified: Secondary | ICD-10-CM | POA: Diagnosis not present

## 2021-08-23 DIAGNOSIS — Z0000 Encounter for general adult medical examination without abnormal findings: Secondary | ICD-10-CM

## 2021-08-23 DIAGNOSIS — F411 Generalized anxiety disorder: Secondary | ICD-10-CM

## 2021-08-23 DIAGNOSIS — Z9884 Bariatric surgery status: Secondary | ICD-10-CM

## 2021-08-23 DIAGNOSIS — Z6841 Body Mass Index (BMI) 40.0 and over, adult: Secondary | ICD-10-CM | POA: Diagnosis not present

## 2021-08-23 DIAGNOSIS — E282 Polycystic ovarian syndrome: Secondary | ICD-10-CM

## 2021-08-23 DIAGNOSIS — I5189 Other ill-defined heart diseases: Secondary | ICD-10-CM

## 2021-08-23 LAB — HEMOGLOBIN A1C: Hgb A1c MFr Bld: 5.8 % (ref 4.6–6.5)

## 2021-08-23 LAB — VITAMIN D 25 HYDROXY (VIT D DEFICIENCY, FRACTURES): VITD: 18.21 ng/mL — ABNORMAL LOW (ref 30.00–100.00)

## 2021-08-23 MED ORDER — SAXENDA 18 MG/3ML ~~LOC~~ SOPN: 1.2000 mg | PEN_INJECTOR | Freq: Every day | SUBCUTANEOUS | 1 refills | Status: DC

## 2021-08-23 MED ORDER — SAXENDA 18 MG/3ML ~~LOC~~ SOPN: 0.6000 mg | PEN_INJECTOR | Freq: Every day | SUBCUTANEOUS | 0 refills | Status: DC

## 2021-08-23 NOTE — Progress Notes (Unsigned)
Subjective  Chief Complaint  Patient presents with   Annual Exam    Pt here for annual Exam and is currently fasting      HPI: Melissa Erickson is a 41 y.o. female who presents to [facility] [place] today for a Female Wellness Visit. She also has the concerns and/or needs as listed above in the chief complaint. These will be addressed in addition to the Health Maintenance Visit.   Wellness Visit: annual visit with health maintenance review and exam {With-without:32421} Pap  *** Chronic disease f/u and/or acute problem visit: (deemed necessary to be done in addition to the wellness visit): ***  Assessment  1. Annual physical exam   2. Morbid obesity with BMI of 50.0-59.9, adult ([place])   3. Gastric bypass status for obesity   4. GAD (generalized anxiety disorder)   5. Iron deficiency   6. Vitamin D deficiency      Plan  Female Wellness Visit: Age appropriate Health Maintenance and Prevention measures were discussed with patient. Included topics are cancer screening recommendations, ways to keep healthy (see AVS) including dietary and exercise recommendations, regular eye and dental care, use of seat belts, and avoidance of moderate alcohol use and tobacco use.  BMI: discussed patient's BMI and encouraged positive lifestyle modifications to help get to or maintain a target BMI. HM needs and immunizations were addressed and ordered. See below for orders. See HM and immunization section for updates. Routine labs and screening tests ordered including cmp, cbc and lipids where appropriate. Discussed recommendations regarding Vit D and calcium supplementation (see AVS)  Chronic disease management visit and/or acute problem visit: *** Follow up: No follow-ups on file.  Orders Placed This Encounter  Procedures   Hemoglobin A1c   VITAMIN D 25 Hydroxy (Vit-D Deficiency, Fractures)   Iron, TIBC and Ferritin Panel   No orders of the defined types were placed in this  encounter.     Body mass index is 59.94 kg/m. Wt Readings from Last 3 Encounters:  08/23/21 (!) 349 lb 3.2 oz (158.4 kg)  08/09/21 (!) 351 lb 9.6 oz (159.5 kg)  06/19/21 (!) 359 lb 9.6 oz (163.1 kg)     Patient Active Problem List   Diagnosis Date Noted   Gastric bypass status for obesity 07/21/2017    Priority: High   History of obstructive sleep apnea 08/08/2013    Priority: High   Morbid obesity with BMI of 50.0-59.9, adult ([place]) 06/14/2012    Priority: High   GAD (generalized anxiety disorder) 06/19/2021    Priority: Medium    PCOS (polycystic ovarian syndrome) 07/21/2017    Priority: Medium    Suppurative hidradenitis 07/21/2017    Priority: Medium    Spondylosis of lumbar region without myelopathy or radiculopathy 07/21/2017    Priority: Medium    Primary osteoarthritis of both hips 07/21/2017    Priority: Medium    NSTEMI (non-ST elevated myocardial infarction), type 2 secondary to demand ischemia sue to sepsis 07/20/2013    Priority: Medium    History of ARDS 07/18/2013    Priority: Medium     ARDS requiring intubation after gastric sleeve bypass 05/100      Diastolic dysfunction without heart failure 07/18/2013    Priority: Medium    Asthma, mild intermittent 03/11/2013    Priority: Medium     Started Advair 250/500 02/2013     Migraine 06/14/2012    Priority: Medium    Iron deficiency 08/23/2021  Priority: Low   Vitamin D deficiency 08/23/2021    Priority: Low   Seasonal allergic rhinitis due to pollen 06/21/2018    Priority: Low   Health Maintenance  Topic Date Due   COVID-19 Vaccine (4 - Pfizer series) 08/25/2021 (Originally 01/23/2020)   INFLUENZA VACCINE  09/17/2021   PAP SMEAR-Modifier  07/24/2022   TETANUS/TDAP  09/13/2026   Hepatitis C Screening  Completed   HIV Screening  Completed   HPV VACCINES  Aged Out   Immunization History  Administered Date(s) Administered   Influenza,inj,Quad PF,6+ Mos 01/21/2019   PFIZER Comirnaty(Gray  Top)Covid-19 Tri-Sucrose Vaccine 04/15/2019, 05/07/2019, 11/28/2019   PPD Test 08/22/2018   Pneumococcal Polysaccharide-23 04/01/2013   Tdap 09/12/2016   We updated and reviewed the patient's past history in detail and it is documented below. Allergies: Patient has No Known Allergies. Past Medical History Patient  has a past medical history of Allergy, Asthma, Chicken pox, Environmental allergies, Essential hypertension (07/18/2013), Gallstones, Heart attack ([place]), History of congestive heart failure (2015), History of pneumonia, Leiomyoma of uterus, unspecified (07/26/2019), Migraine, Polycystic ovarian syndrome, Sleep apnea, Suppurative hidradenitis (07/21/2017), and Ventral hernia. Past Surgical History Patient  has a past surgical history that includes Cholecystectomy; Laparoscopic gastric sleeve resection; laparoscopy (N/A, 07/19/2013); Esophagogastroduodenoscopy (N/A, 07/19/2013); Cesarean section (2007); Breast biopsy (Right, 2001); Total laparoscopic hysterectomy with salpingectomy (Bilateral, 07/26/2019); and Hysterectomy abdominal with salpingectomy (N/A, 07/26/2019). Family History: Patient family history includes Alcohol abuse in her father; Allergic rhinitis in her daughter; Alzheimer's disease in her maternal grandmother; COPD in her maternal grandfather; Cancer in her maternal grandfather, paternal grandfather, and paternal grandmother; Cerebral aneurysm in her paternal grandmother; Diabetes in her maternal grandfather and mother; Early death in her father and paternal grandmother; Healthy in her brother; Heart attack in her maternal grandmother; Miscarriages / [place] in her mother; Sarcoidosis in her father; Stroke in her mother. Social History:  Patient  reports that she has never smoked. She has never used smokeless tobacco. She reports current alcohol use. She reports that she does not use drugs.  Review of Systems: Constitutional: negative for fever or malaise Ophthalmic: negative for  photophobia, double vision or loss of vision Cardiovascular: negative for chest pain, dyspnea on exertion, or new LE swelling Respiratory: negative for SOB or persistent cough Gastrointestinal: negative for abdominal pain, change in bowel habits or melena Genitourinary: negative for dysuria or gross hematuria, no abnormal uterine bleeding or disharge Musculoskeletal: negative for new gait disturbance or muscular weakness Integumentary: negative for new or persistent rashes, no breast lumps Neurological: negative for TIA or stroke symptoms Psychiatric: negative for SI or delusions Allergic/Immunologic: negative for hives  Patient Care Team    Relationship Specialty Notifications Start End  Leamon Arnt, MD PCP - General Family Medicine  07/21/17   Sanjuana Kava, MD Referring Physician Obstetrics and Gynecology  07/21/17     Objective  Vitals: BP 108/60   Pulse 84   Temp 97.7 F (36.5 C)   Ht '5\' 4"'$  (1.626 m)   Wt (!) 349 lb 3.2 oz (158.4 kg)   LMP 07/22/2019   SpO2 96%   BMI 59.94 kg/m  General:  Well developed, well nourished, no acute distress  Psych:  Alert and orientedx3,normal mood and affect HEENT:  Normocephalic, atraumatic, non-icteric sclera,  supple neck without adenopathy, mass or thyromegaly Cardiovascular:  Normal S1, S2, RRR without gallop, rub or murmur Respiratory:  Good breath sounds bilaterally, CTAB with normal respiratory effort Gastrointestinal: normal bowel sounds, soft, non-tender, no noted  masses. No HSM MSK: no deformities, contusions. Joints are without erythema or swelling.  Skin:  Warm, no rashes or suspicious lesions noted Neurologic:    Mental status is normal. CN 2-11 are normal. Gross motor and sensory exams are normal. Normal gait. No tremor Breast Exam: No mass, skin retraction or nipple discharge is appreciated in either breast. No axillary adenopathy. Fibrocystic changes {Actions; are/are not:16769} noted Pelvic Exam: Normal external genitalia, no  vulvar or vaginal lesions present. Clear cervix w/o CMT. Bimanual exam reveals a nontender fundus w/o masses, nl size. No adnexal masses present. No inguinal adenopathy. A PAP smear {WAS/WAS [phone]::"was not"} performed.   Commons side effects, risks, benefits, and alternatives for medications and treatment plan prescribed today were discussed, and the patient expressed understanding of the given instructions. Patient is instructed to call or message via MyChart if he/she has any questions or concerns regarding our treatment plan. No barriers to understanding were identified. We discussed Red Flag symptoms and signs in detail. Patient expressed understanding regarding what to do in case of urgent or emergency type symptoms.  Medication list was reconciled, printed and provided to the patient in AVS. Patient instructions and summary information was reviewed with the patient as documented in the AVS. This note was prepared with assistance of Dragon voice recognition software. Occasional wrong-word or sound-a-like substitutions may have occurred due to the inherent limitations of voice recognition software  This visit occurred during the SARS-CoV-2 public health emergency.  Safety protocols were in place, including screening questions prior to the visit, additional usage of staff PPE, and extensive cleaning of exam room while observing appropriate contact time as indicated for disinfecting solutions.

## 2021-08-23 NOTE — Patient Instructions (Addendum)
Please return in 3 months to recheck weight   I will release your lab results to you on your MyChart account with further instructions. You may see the results before I do, but when I review them I will send you a message with my report or have my assistant call you if things need to be discussed. Please reply to my message with any questions. Thank you!   We will start saxenda daily unless your insurance will cover another formuation: wegovy, ozempic or mounjaro. I sent over the starter dose of the saxenda; I've printed the next dose up for refills.   If you have any questions or concerns, please don't hesitate to send me a message via MyChart or call the office at [phone]. Thank you for visiting with [place] today! It's our pleasure caring for you.

## 2021-08-24 LAB — IRON,TIBC AND FERRITIN PANEL
%SAT: 19 % (calc) (ref 16–45)
Ferritin: 16 ng/mL (ref 16–232)
Iron: 62 ug/dL (ref 40–190)
TIBC: 334 mcg/dL (calc) (ref 250–450)

## 2021-08-25 MED ORDER — OZEMPIC (0.25 OR 0.5 MG/DOSE) 2 MG/3ML ~~LOC~~ SOPN: 0.2500 mg | PEN_INJECTOR | SUBCUTANEOUS | 2 refills | Status: DC

## 2021-10-27 ENCOUNTER — Encounter (HOSPITAL_BASED_OUTPATIENT_CLINIC_OR_DEPARTMENT_OTHER): Payer: Self-pay | Admitting: Emergency Medicine

## 2021-10-27 ENCOUNTER — Emergency Department (HOSPITAL_BASED_OUTPATIENT_CLINIC_OR_DEPARTMENT_OTHER): Payer: BC Managed Care – PPO

## 2021-10-27 ENCOUNTER — Emergency Department (HOSPITAL_BASED_OUTPATIENT_CLINIC_OR_DEPARTMENT_OTHER)
Admission: EM | Admit: 2021-10-27 | Discharge: 2021-10-27 | Disposition: A | Discharge status: Home / Self Care | Payer: BC Managed Care – PPO | Attending: Emergency Medicine | Admitting: Emergency Medicine

## 2021-10-27 ENCOUNTER — Other Ambulatory Visit: Payer: Self-pay

## 2021-10-27 DIAGNOSIS — N83201 Unspecified ovarian cyst, right side: Secondary | ICD-10-CM | POA: Insufficient documentation

## 2021-10-27 DIAGNOSIS — R109 Unspecified abdominal pain: Secondary | ICD-10-CM

## 2021-10-27 LAB — CBC WITH DIFFERENTIAL/PLATELET
Abs Immature Granulocytes: 0.01 10*3/uL (ref 0.00–0.07)
Basophils Absolute: 0 10*3/uL (ref 0.0–0.1)
Basophils Relative: 1 %
Eosinophils Absolute: 0.1 10*3/uL (ref 0.0–0.5)
Eosinophils Relative: 1 %
HCT: 40.1 % (ref 36.0–46.0)
Hemoglobin: 12.7 g/dL (ref 12.0–15.0)
Immature Granulocytes: 0 %
Lymphocytes Relative: 25 %
Lymphs Abs: 1.8 10*3/uL (ref 0.7–4.0)
MCH: 27.1 pg (ref 26.0–34.0)
MCHC: 31.7 g/dL (ref 30.0–36.0)
MCV: 85.7 fL (ref 80.0–100.0)
Monocytes Absolute: 0.4 10*3/uL (ref 0.1–1.0)
Monocytes Relative: 5 %
Neutro Abs: 4.9 10*3/uL (ref 1.7–7.7)
Neutrophils Relative %: 68 %
Platelets: 196 10*3/uL (ref 150–400)
RBC: 4.68 MIL/uL (ref 3.87–5.11)
RDW: 13.9 % (ref 11.5–15.5)
WBC: 7.1 10*3/uL (ref 4.0–10.5)
nRBC: 0 % (ref 0.0–0.2)

## 2021-10-27 LAB — URINALYSIS, ROUTINE W REFLEX MICROSCOPIC
Bilirubin Urine: NEGATIVE
Glucose, UA: NEGATIVE mg/dL
Hgb urine dipstick: NEGATIVE
Ketones, ur: NEGATIVE mg/dL
Leukocytes,Ua: NEGATIVE
Nitrite: NEGATIVE
Specific Gravity, Urine: 1.032 — ABNORMAL HIGH (ref 1.005–1.030)
pH: 5.5 (ref 5.0–8.0)

## 2021-10-27 LAB — COMPREHENSIVE METABOLIC PANEL
ALT: 11 U/L (ref 0–44)
AST: 10 U/L — ABNORMAL LOW (ref 15–41)
Albumin: 3.4 g/dL — ABNORMAL LOW (ref 3.5–5.0)
Alkaline Phosphatase: 53 U/L (ref 38–126)
Anion gap: 8 (ref 5–15)
BUN: 9 mg/dL (ref 6–20)
CO2: 25 mmol/L (ref 22–32)
Calcium: 8.5 mg/dL — ABNORMAL LOW (ref 8.9–10.3)
Chloride: 105 mmol/L (ref 98–111)
Creatinine, Ser: 0.75 mg/dL (ref 0.44–1.00)
GFR, Estimated: >60 mL/min (ref >60)
Glucose, Bld: 140 mg/dL — ABNORMAL HIGH (ref 70–99)
Potassium: 3.5 mmol/L (ref 3.5–5.1)
Sodium: 138 mmol/L (ref 135–145)
Total Bilirubin: 0.5 mg/dL (ref 0.3–1.2)
Total Protein: 6.8 g/dL (ref 6.5–8.1)

## 2021-10-27 LAB — PREGNANCY, URINE: Preg Test, Ur: NEGATIVE

## 2021-10-27 MED ORDER — ACETAMINOPHEN 500 MG PO TABS
1000.0000 mg | ORAL_TABLET | Freq: Once | ORAL | Status: AC
  Administered 2021-10-27: 1000 mg via ORAL
  Filled 2021-10-27: qty 2

## 2021-10-27 NOTE — Discharge Instructions (Addendum)
Use Tylenol every 4 hours, ibuprofen every 6 hours and heat pads as needed.  Follow-up closely with your doctor if pain persists or worsens later this week.

## 2021-10-27 NOTE — ED Notes (Signed)
Transported to ultrasound

## 2021-10-27 NOTE — ED Notes (Signed)
Dc instructions reviewed with patient. Patient voiced understanding. Dc with belongings.  °

## 2021-10-27 NOTE — ED Triage Notes (Signed)
Pt having lower right back pain around to the abdomen area. Burning pain

## 2021-10-27 NOTE — ED Provider Notes (Signed)
[facility] EMERGENCY DEPT Provider Note   CSN: [id number] Arrival date & time: 10/27/21  0818     History {Add pertinent medical, surgical, social history, OB history to HPI:1} Chief Complaint  Patient presents with  . Back Pain    Melissa Erickson is a 41 y.o. female.  Patient presents with intermittent right flank and back pain that comes anterior to the abdomen at times.  Burning sensation.  No rashes no injuries.  Occasionally worse with movement however not consistently.  No fevers chills or vomiting.  Patient gallbladder removed in the past.  Patient as well as appendix.  No history of kidney stones.      Home Medications Prior to Admission medications   Medication Sig Start Date End Date Taking? Authorizing Provider  albuterol (VENTOLIN HFA) 108 (90 Base) MCG/ACT inhaler Inhale 2 puffs into the lungs every 4 (four) hours as needed for wheezing or shortness of breath. 02/23/19   Leamon Arnt, MD  ALPRAZolam Duanne Moron) 0.5 MG tablet Take 0.5 mg by mouth 3 (three) times daily as needed. 06/01/19   [provider]  Multiple Vitamins-Minerals (MULTIVITAMIN WITH MINERALS) tablet Take 1 tablet by mouth daily.    [provider]  Semaglutide,0.25 or 0.'5MG'$ /DOS, (OZEMPIC, 0.25 OR 0.5 MG/DOSE,) 2 MG/3ML SOPN Inject 0.25 mg into the skin once a week. 08/25/21   Leamon Arnt, MD  sertraline (ZOLOFT) 100 MG tablet Take 1 tablet (100 mg total) by mouth daily. 06/19/21   Leamon Arnt, MD  SUMAtriptan (IMITREX) 25 MG tablet Take 1 tablet (25 mg total) by mouth once as needed for up to 1 dose for migraine. May repeat in 2 hours if headache persists or recurs. 02/23/19   Leamon Arnt, MD      Allergies    Patient has no known allergies.    Review of Systems   Review of Systems  Constitutional:  Negative for chills and fever.  HENT:  Negative for congestion.   Eyes:  Negative for visual disturbance.  Respiratory:  Negative for shortness of breath.    Cardiovascular:  Negative for chest pain.  Gastrointestinal:  Negative for abdominal pain and vomiting.  Genitourinary:  Positive for flank pain. Negative for dysuria.  Musculoskeletal:  Positive for back pain. Negative for neck pain and neck stiffness.  Skin:  Negative for rash.  Neurological:  Negative for weakness, light-headedness, numbness and headaches.    Physical Exam Updated Vital Signs BP 115/70   Pulse 95   Temp 98 F (36.7 C) (Oral)   Resp 16   LMP 07/22/2019   SpO2 100%  Physical Exam Vitals and nursing note reviewed.  Constitutional:      General: She is not in acute distress.    Appearance: She is well-developed.  HENT:     Head: Normocephalic and atraumatic.     Mouth/Throat:     Mouth: Mucous membranes are moist.  Eyes:     General:        Right eye: No discharge.        Left eye: No discharge.     Conjunctiva/sclera: Conjunctivae normal.  Neck:     Trachea: No tracheal deviation.  Cardiovascular:     Rate and Rhythm: Normal rate and regular rhythm.     Heart sounds: No murmur heard. Pulmonary:     Effort: Pulmonary effort is normal.     Breath sounds: Normal breath sounds.  Abdominal:     General: There is no distension.  Palpations: Abdomen is soft.     Tenderness: There is no abdominal tenderness. There is no guarding.  Musculoskeletal:        General: No swelling.     Cervical back: Normal range of motion and neck supple. No rigidity.     Comments: Mild tenderness with deep palpation right lower flank does not totally reproduce pain.  No rash.  Skin:    General: Skin is warm.     Capillary Refill: Capillary refill takes less than 2 seconds.     Findings: No rash.  Neurological:     General: No focal deficit present.     Mental Status: She is alert.     Cranial Nerves: No cranial nerve deficit.  Psychiatric:        Mood and Affect: Mood normal.    ED Results / Procedures / Treatments   Labs (all labs ordered are listed, but only  abnormal results are displayed) Labs Reviewed  URINALYSIS, ROUTINE W REFLEX MICROSCOPIC - Abnormal; Notable for the following components:      Result Value   APPearance HAZY (*)    Specific Gravity, Urine 1.032 (*)    Protein, ur TRACE (*)    Bacteria, UA FEW (*)    All other components within normal limits  CBC WITH DIFFERENTIAL/PLATELET  PREGNANCY, URINE  COMPREHENSIVE METABOLIC PANEL    EKG None  Radiology No results found.  Procedures Procedures  {Document cardiac monitor, telemetry assessment procedure when appropriate:1}  Medications Ordered in ED Medications  acetaminophen (TYLENOL) tablet 1,000 mg (1,000 mg Oral Given 10/27/21 0901)    ED Course/ Medical Decision Making/ A&P                           Medical Decision Making Amount and/or Complexity of Data Reviewed Labs: ordered. Radiology: ordered.  Risk OTC drugs.   Patient presents with intermittent and now fairly constant right flank pain differential includes musculoskeletal, peripheral nerve related, atypical appendix however less likely with no fever/vomiting or anterior abdominal tenderness, kidney stone, urinary or other kidney related, other.  General blood work ordered and reviewed normal white blood cell count, normal hemoglobin, electrolytes unremarkable.  Urinalysis pending.  CT scan stone study ordered and pending.  Pain meds given.  {Document critical care time when appropriate:1} {Document review of labs and clinical decision tools ie heart score, Chads2Vasc2 etc:1}  {Document your independent review of radiology images, and any outside records:1} {Document your discussion with family members, caretakers, and with consultants:1} {Document social determinants of health affecting pt's care:1} {Document your decision making why or why not admission, treatments were needed:1} Final Clinical Impression(s) / ED Diagnoses Final diagnoses:  Acute right flank pain    Rx / DC Orders ED Discharge  Orders     None

## 2021-11-11 ENCOUNTER — Encounter: Payer: Self-pay | Admitting: *Deleted

## 2021-11-21 ENCOUNTER — Telehealth: Payer: Self-pay | Admitting: Family Medicine

## 2021-11-21 NOTE — Telephone Encounter (Signed)
Patient states: - Upon trying to get ozempic refilled, pharmacy told her that a PA was needed before getting medication refilled  - Pharmacy also needs to confirm if patient can increase to 0.5 mg - Pharmacy informed her they have sent multiple PA requests to office   Please advise.

## 2021-11-22 NOTE — Telephone Encounter (Signed)
PA has been submitted. Waiting on approval/denial

## 2021-11-25 ENCOUNTER — Ambulatory Visit: Payer: BC Managed Care – PPO | Admitting: Family Medicine

## 2021-11-25 ENCOUNTER — Encounter: Payer: Self-pay | Admitting: Family Medicine

## 2021-11-25 VITALS — BP 138/89 | HR 75 | Temp 97.9°F | Ht 64.0 in | Wt 348.0 lb

## 2021-11-25 DIAGNOSIS — Z1231 Encounter for screening mammogram for malignant neoplasm of breast: Secondary | ICD-10-CM

## 2021-11-25 DIAGNOSIS — N83201 Unspecified ovarian cyst, right side: Secondary | ICD-10-CM

## 2021-11-25 DIAGNOSIS — Z9884 Bariatric surgery status: Secondary | ICD-10-CM

## 2021-11-25 DIAGNOSIS — Z23 Encounter for immunization: Secondary | ICD-10-CM | POA: Diagnosis not present

## 2021-11-25 DIAGNOSIS — Z6841 Body Mass Index (BMI) 40.0 and over, adult: Secondary | ICD-10-CM

## 2021-11-25 MED ORDER — PHENTERMINE HCL 37.5 MG PO TABS: 37.5000 mg | ORAL_TABLET | Freq: Every day | ORAL | 2 refills | Status: DC

## 2021-11-25 NOTE — Progress Notes (Signed)
Subjective  CC:  Chief Complaint  Patient presents with   Weight Loss    Pt here for a 5mo F/U with Wt loss    HPI: Melissa Erickson a 41y.o. female who presents to the office today to address the problems listed above in the chief complaint. Obesity: Weight is stable over the last 3 months.  Maintain 10 pound weight loss but has not been able to lose further.  Has not been able to get Ozempic approved.  Not taking anything and trying to eat well. Breast cancer screening: Overdue.  She has had a mammogram in the past.  Would like to continue. ER encounter for right lower quadrant pain: Right ovarian cyst noted on ultrasound.  Has polycystic ovarian syndrome.  Pain has resolved.  Assessment  1. Morbid obesity with BMI of 50.0-59.9, adult ([place]   2. Gastric bypass status for obesity   3. Right ovarian cyst   4. Need for immunization against influenza   5. Encounter for screening mammogram for breast cancer      Plan  Obesity: Continue diet and exercise.  We will add phentermine for appetite suppression.  Offered referral to healthy weight and wellness.  Patient will first check to see if there is any GLP-1 agonist that are covered by her insurance.  If so, we will start with that.  If not referred to healthy weight loss center. Ovarian cyst: PCOS, benign appearance on imaging.  No further follow-up needed unless pain returns. Patient to schedule mammogram Flu shot updated today.  Follow up: 3 to 6 months for weight loss. Visit date not found  Orders Placed This Encounter  Procedures   MM DIGITAL SCREENING BILATERAL   Flu Vaccine QUAD 659moM (Fluarix, Fluzone & Alfiuria Quad PF)   Meds ordered this encounter  Medications   phentermine (ADIPEX-P) 37.5 MG tablet    Sig: Take 1 tablet (37.5 mg total) by mouth daily before breakfast.    Dispense:  30 tablet    Refill:  2      I reviewed the patients updated PMH, FH, and SocHx.    Patient Active Problem List   Diagnosis  Date Noted   Gastric bypass status for obesity 07/21/2017    Priority: High   History of obstructive sleep apnea 08/08/2013    Priority: High   Morbid obesity with BMI of 50.0-59.9, adult ([place]04/28/2014    Priority: High   GAD (generalized anxiety disorder) 06/19/2021    Priority: Medium    PCOS (polycystic ovarian syndrome) 07/21/2017    Priority: Medium    Suppurative hidradenitis 07/21/2017    Priority: Medium    Spondylosis of lumbar region without myelopathy or radiculopathy 07/21/2017    Priority: Medium    Primary osteoarthritis of both hips 07/21/2017    Priority: Medium    NSTEMI (non-ST elevated myocardial infarction), type 2 secondary to demand ischemia sue to sepsis 07/20/2013    Priority: Medium    History of ARDS 07/18/2013    Priority: Medium    Diastolic dysfunction without heart failure 07/18/2013    Priority: Medium    Asthma, mild intermittent 03/11/2013    Priority: Medium    Migraine 06/14/2012    Priority: Medium    Iron deficiency 08/23/2021    Priority: Low   Vitamin D deficiency 08/23/2021    Priority: Low   Seasonal allergic rhinitis due to pollen 06/21/2018    Priority: Low   Current Meds  Medication Sig  albuterol (VENTOLIN HFA) 108 (90 Base) MCG/ACT inhaler Inhale 2 puffs into the lungs every 4 (four) hours as needed for wheezing or shortness of breath.   ALPRAZolam (XANAX) 0.5 MG tablet Take 0.5 mg by mouth 3 (three) times daily as needed.   Multiple Vitamins-Minerals (MULTIVITAMIN WITH MINERALS) tablet Take 1 tablet by mouth daily.   phentermine (ADIPEX-P) 37.5 MG tablet Take 1 tablet (37.5 mg total) by mouth daily before breakfast.   sertraline (ZOLOFT) 100 MG tablet Take 1 tablet (100 mg total) by mouth daily.   SUMAtriptan (IMITREX) 25 MG tablet Take 1 tablet (25 mg total) by mouth once as needed for up to 1 dose for migraine. May repeat in 2 hours if headache persists or recurs.    Allergies: Patient has No Known Allergies. Family  History: Patient family history includes Alcohol abuse in her father; Allergic rhinitis in her daughter; Alzheimer's disease in her maternal grandmother; COPD in her maternal grandfather; Cancer in her maternal grandfather, paternal grandfather, and paternal grandmother; Cerebral aneurysm in her paternal grandmother; Diabetes in her maternal grandfather and mother; Early death in her father and paternal grandmother; Healthy in her brother; Heart attack in her maternal grandmother; Miscarriages / [place] in her mother; Sarcoidosis in her father; Stroke in her mother. Social History:  Patient  reports that she has never smoked. She has never used smokeless tobacco. She reports current alcohol use. She reports that she does not use drugs.  Review of Systems: Constitutional: Negative for fever malaise or anorexia Cardiovascular: negative for chest pain Respiratory: negative for SOB or persistent cough Gastrointestinal: negative for abdominal pain  Objective  Vitals: BP 138/89   Pulse 75   Temp 97.9 F (36.6 C)   Ht '5\' 4"'$  (1.626 m)   Wt (!) 348 lb (157.9 kg)   LMP 07/22/2019   SpO2 99%   BMI 59.73 kg/m  General: no acute distress , A&Ox3 Wt Readings from Last 3 Encounters:  11/25/21 (!) 348 lb (157.9 kg)  08/23/21 (!) 349 lb 3.2 oz (158.4 kg)  08/09/21 (!) 351 lb 9.6 oz (159.5 kg)      Commons side effects, risks, benefits, and alternatives for medications and treatment plan prescribed today were discussed, and the patient expressed understanding of the given instructions. Patient is instructed to call or message via MyChart if he/she has any questions or concerns regarding our treatment plan. No barriers to understanding were identified. We discussed Red Flag symptoms and signs in detail. Patient expressed understanding regarding what to do in case of urgent or emergency type symptoms.  Medication list was reconciled, printed and provided to the patient in AVS. Patient instructions  and summary information was reviewed with the patient as documented in the AVS. This note was prepared with assistance of Dragon voice recognition software. Occasional wrong-word or sound-a-like substitutions may have occurred due to the inherent limitations of voice recognition software  This visit occurred during the SARS-CoV-2 public health emergency.  Safety protocols were in place, including screening questions prior to the visit, additional usage of staff PPE, and extensive cleaning of exam room while observing appropriate contact time as indicated for disinfecting solutions.

## 2021-11-25 NOTE — Patient Instructions (Signed)
Please return in 3-6 months for weight loss recheck.   Your cyst is on the right and is ok. No need for follow up specifically for this.   If you have any questions or concerns, please don't hesitate to send me a message via MyChart or call the office at [phone]. Thank you for visiting with [place] today! It's our pleasure caring for you.   I have ordered a mammogram and/or bone density for you as we discussed today: '[x]'$   Mammogram  '[]'$   Bone Density  Please call the office checked below to schedule your appointment:  '[x]'$   [facility]       [place], [place]         '[]'$   [facility]  [address] [place], [place]

## 2021-11-29 MED ORDER — WEGOVY 0.25 MG/0.5ML ~~LOC~~ SOAJ: 0.2500 mg | SUBCUTANEOUS | 0 refills | Status: DC

## 2021-11-29 NOTE — Telephone Encounter (Signed)
See mychart note.  Ordered wegovy.  Start Prior auth if needed; morbid obesity; failed gastric bypass and phentermine

## 2021-12-02 ENCOUNTER — Telehealth: Payer: Self-pay

## 2021-12-02 NOTE — Telephone Encounter (Signed)
Mancel Parsons has been approved.

## 2021-12-20 ENCOUNTER — Ambulatory Visit (INDEPENDENT_AMBULATORY_CARE_PROVIDER_SITE_OTHER): Payer: BC Managed Care – PPO | Admitting: Orthopaedic Surgery

## 2021-12-20 ENCOUNTER — Ambulatory Visit
Admission: RE | Admit: 2021-12-20 | Discharge: 2021-12-20 | Disposition: A | Discharge status: Home / Self Care | Payer: BC Managed Care – PPO | Source: Ambulatory Visit | Attending: Family Medicine | Admitting: Family Medicine

## 2021-12-20 DIAGNOSIS — G8929 Other chronic pain: Secondary | ICD-10-CM | POA: Diagnosis not present

## 2021-12-20 DIAGNOSIS — Z1231 Encounter for screening mammogram for malignant neoplasm of breast: Secondary | ICD-10-CM

## 2021-12-20 DIAGNOSIS — M545 Low back pain, unspecified: Secondary | ICD-10-CM

## 2021-12-20 NOTE — Progress Notes (Signed)
Chief Complaint: Right lower back pain     History of Present Illness:    Melissa Erickson is a 41 y.o. female presents with multiple months and years of right lower back pain that is centered around the posterior obliques as well as latissimus muscle on the right side.  She is here today for further assessment as she went to her OB/GYN provider and was told that she may have a herniated disc.  Denies any pain radiating down the leg.  Has not had any physical therapy or injections for the leg although she has previously had dry needling on the left shoulder which helped her significantly.  She is here today for further assessment.  She does use a heating pad which helps somewhat    Surgical History:   None  PMH/PSH/Family History/Social History/Meds/Allergies:    Past Medical History:  Diagnosis Date   Allergy    Asthma    Chicken pox    Environmental allergies    Seasonal   Essential hypertension 07/18/2013   Gallstones    Heart attack ([place])    NSTEMI 07/2013, demand ischemia in setting of ARDS, post-gastric sleeve    History of congestive heart failure 2015   History of pneumonia    Leiomyoma of uterus, unspecified 07/26/2019   Migraine    Polycystic ovarian syndrome    Sleep apnea    Suppurative hidradenitis 07/21/2017   Ventral hernia    Past Surgical History:  Procedure Laterality Date   BREAST BIOPSY Right 2001   Normal   CESAREAN SECTION  2007   CHOLECYSTECTOMY     ESOPHAGOGASTRODUODENOSCOPY N/A 07/19/2013   Procedure: ESOPHAGOGASTRODUODENOSCOPY (EGD);  Surgeon: Harl Bowie, MD;  Location: [facility];  Service: General;  Laterality: N/A;   HYSTERECTOMY ABDOMINAL WITH SALPINGECTOMY N/A 07/26/2019   Procedure: Hysterectomy Abdominal With Salpingectomy;  Surgeon: Sanjuana Kava, MD;  Location: [facility];  Service: Gynecology;  Laterality: N/A;   LAPAROSCOPIC GASTRIC SLEEVE RESECTION     LAPAROSCOPY N/A 07/19/2013   Procedure: LAPAROSCOPY  DIAGNOSTIC;  Surgeon: Harl Bowie, MD;  Location: [facility];  Service: General;  Laterality: N/A;   TOTAL LAPAROSCOPIC HYSTERECTOMY WITH SALPINGECTOMY Bilateral 07/26/2019   Procedure: Attempted TOTAL LAPAROSCOPIC HYSTERECTOMY WITH SALPINGECTOMY;  Surgeon: Sanjuana Kava, MD;  Location: [facility];  Service: Gynecology;  Laterality: Bilateral;   Social History   Socioeconomic History   Marital status: Single    Spouse name: Not on file   Number of children: Not on file   Years of education: Not on file   Highest education level: Not on file  Occupational History   Occupation: Human resources officer, call center positions    Employer: [employer]    Comment: [employer]  Tobacco Use   Smoking status: Never   Smokeless tobacco: Never  Vaping Use   Vaping Use: Never used  Substance and Sexual Activity   Alcohol use: Yes    Comment: occ   Drug use: Never   Sexual activity: Yes    Birth control/protection: None  Other Topics Concern   Not on file  Social History Narrative   ** Merged History Encounter **       Lives with long term boyfriend and daughter.   Social Determinants of Health   Financial Resource Strain: Not on file  Food Insecurity: Not  on file  Transportation Needs: Not on file  Physical Activity: Not on file  Stress: Not on file  Social Connections: Not on file   Family History  Problem Relation Age of Onset   Diabetes Mother    Stroke Mother    Miscarriages / [place] Mother    Sarcoidosis Father    Alcohol abuse Father    Early death Father    Healthy Brother    Alzheimer's disease Maternal Grandmother    Heart attack Maternal Grandmother    Diabetes Maternal Grandfather    COPD Maternal Grandfather    Cancer Maternal Grandfather    Cancer Paternal Grandmother    Cerebral aneurysm Paternal Grandmother    Early death Paternal Grandmother    Cancer Paternal Grandfather    Allergic rhinitis Daughter    No Known Allergies Current Outpatient Medications   Medication Sig Dispense Refill   albuterol (VENTOLIN HFA) 108 (90 Base) MCG/ACT inhaler Inhale 2 puffs into the lungs every 4 (four) hours as needed for wheezing or shortness of breath. 18 g 2   ALPRAZolam (XANAX) 0.5 MG tablet Take 0.5 mg by mouth 3 (three) times daily as needed.     Multiple Vitamins-Minerals (MULTIVITAMIN WITH MINERALS) tablet Take 1 tablet by mouth daily.     phentermine (ADIPEX-P) 37.5 MG tablet Take 1 tablet (37.5 mg total) by mouth daily before breakfast. 30 tablet 2   sertraline (ZOLOFT) 100 MG tablet Take 1 tablet (100 mg total) by mouth daily. 90 tablet 3   SUMAtriptan (IMITREX) 25 MG tablet Take 1 tablet (25 mg total) by mouth once as needed for up to 1 dose for migraine. May repeat in 2 hours if headache persists or recurs. 10 tablet 2   WEGOVY 0.25 MG/0.5ML SOAJ Inject 0.25 mg into the skin once a week. 2 mL 0   No current facility-administered medications for this visit.   No results found.  Review of Systems:   A ROS was performed including pertinent positives and negatives as documented in the HPI.  Physical Exam :   Constitutional: NAD and appears stated age Neurological: Alert and oriented Psych: Appropriate affect and cooperative Last menstrual period 07/22/2019.   Comprehensive Musculoskeletal Exam:    Tenderness palpation about right latissimus muscle as well as obliques posteriorly.  There is no radiation to the leg.  Negative straight leg raise.  Imaging:    I personally reviewed and interpreted the radiographs.   Assessment:   41 y.o. female with right lower back pain which appears to be muscular in etiology.  At today's visit I described that I believe physical therapy for dry needling and/or aquatic therapy would be the most helpful for her to work on her right lower lumbar muscles.  I did also advise her on stretching tone as well.  She will return to clinic as needed  Plan :    -Return to clinic as needed     I personally saw and  evaluated the patient, and participated in the management and treatment plan.  Vanetta Mulders, MD Attending Physician, Orthopedic Surgery  This document was dictated using Dragon voice recognition software. A reasonable attempt at proof reading has been made to minimize errors.

## 2022-01-08 ENCOUNTER — Ambulatory Visit: Payer: BC Managed Care – PPO

## 2022-01-14 ENCOUNTER — Other Ambulatory Visit: Payer: Self-pay

## 2022-01-14 ENCOUNTER — Ambulatory Visit: Payer: BC Managed Care – PPO | Attending: Orthopaedic Surgery

## 2022-01-14 DIAGNOSIS — M545 Low back pain, unspecified: Secondary | ICD-10-CM | POA: Diagnosis not present

## 2022-01-14 DIAGNOSIS — G8929 Other chronic pain: Secondary | ICD-10-CM | POA: Insufficient documentation

## 2022-01-14 DIAGNOSIS — M5459 Other low back pain: Secondary | ICD-10-CM | POA: Insufficient documentation

## 2022-01-14 DIAGNOSIS — M6281 Muscle weakness (generalized): Secondary | ICD-10-CM | POA: Insufficient documentation

## 2022-01-14 NOTE — Therapy (Unsigned)
OUTPATIENT PHYSICAL THERAPY THORACOLUMBAR EVALUATION   Patient Name: Melissa Erickson MRN: [id number] DOB:1981-01-07, 41 y.o., female Today's Date: 01/15/2022  END OF SESSION:  PT End of Session - 01/15/22 0752     Visit Number 1    Number of Visits 12    Date for PT Re-Evaluation 03/12/22    Authorization Type BCBS    PT Start Time 1745    PT Stop Time 1308    PT Time Calculation (min) 45 min    Activity Tolerance Patient tolerated treatment well    Behavior During Therapy [facility] for tasks assessed/performed             Past Medical History:  Diagnosis Date   Allergy    Asthma    Chicken pox    Environmental allergies    Seasonal   Essential hypertension 07/18/2013   Gallstones    Heart attack ([place])    NSTEMI 07/2013, demand ischemia in setting of ARDS, post-gastric sleeve    History of congestive heart failure 2015   History of pneumonia    Leiomyoma of uterus, unspecified 07/26/2019   Migraine    Polycystic ovarian syndrome    Sleep apnea    Suppurative hidradenitis 07/21/2017   Ventral hernia    Past Surgical History:  Procedure Laterality Date   BREAST BIOPSY Right 2001   Normal   CESAREAN SECTION  2007   CHOLECYSTECTOMY     ESOPHAGOGASTRODUODENOSCOPY N/A 07/19/2013   Procedure: ESOPHAGOGASTRODUODENOSCOPY (EGD);  Surgeon: Harl Bowie, MD;  Location: [place];  Service: General;  Laterality: N/A;   HYSTERECTOMY ABDOMINAL WITH SALPINGECTOMY N/A 07/26/2019   Procedure: Hysterectomy Abdominal With Salpingectomy;  Surgeon: Sanjuana Kava, MD;  Location: [place];  Service: Gynecology;  Laterality: N/A;   LAPAROSCOPIC GASTRIC SLEEVE RESECTION     LAPAROSCOPY N/A 07/19/2013   Procedure: LAPAROSCOPY DIAGNOSTIC;  Surgeon: Harl Bowie, MD;  Location: [place];  Service: General;  Laterality: N/A;   TOTAL LAPAROSCOPIC HYSTERECTOMY WITH SALPINGECTOMY Bilateral 07/26/2019   Procedure: Attempted TOTAL LAPAROSCOPIC HYSTERECTOMY WITH SALPINGECTOMY;  Surgeon: Sanjuana Kava, MD;   Location: [place];  Service: Gynecology;  Laterality: Bilateral;   Patient Active Problem List   Diagnosis Date Noted   Iron deficiency 08/23/2021   Vitamin D deficiency 08/23/2021   GAD (generalized anxiety disorder) 06/19/2021   Seasonal allergic rhinitis due to pollen 06/21/2018   PCOS (polycystic ovarian syndrome) 07/21/2017   Suppurative hidradenitis 07/21/2017   Gastric bypass status for obesity 07/21/2017   Spondylosis of lumbar region without myelopathy or radiculopathy 07/21/2017   Primary osteoarthritis of both hips 07/21/2017   History of obstructive sleep apnea 08/08/2013   NSTEMI (non-ST elevated myocardial infarction), type 2 secondary to demand ischemia sue to sepsis 07/20/2013   History of ARDS 65/78/4696   Diastolic dysfunction without heart failure 07/18/2013   Asthma, mild intermittent 03/11/2013   Migraine 06/14/2012   Morbid obesity with BMI of 50.0-59.9, adult ([place]) 06/14/2012    PCP: Leamon Arnt, MD   REFERRING PROVIDER: Vanetta Mulders, MD   REFERRING DIAG: M54.50,G89.29 (ICD-10-CM) - Chronic right-sided low back pain without sciatica   Rationale for Evaluation and Treatment: Rehabilitation  THERAPY DIAG: Chronic right-sided low back pain without sciatica   ONSET DATE: chronic  SUBJECTIVE:  SUBJECTIVE STATEMENT: Patient relates a 3 year history of low back pain.  Has had x-rays and MRIs which were unremarkable for any disc protrusion or arthritic changes that would cause her symptoms.  Patient reports symptoms onset with prolonged positioning with standing and walking being the most aggravating.  She is offered relief with sitting and laying down however low-level symptoms still persist.  Over-the-counter anti-inflammatories and muscle relaxers do not provide relief.  She  denies radiating symptoms into the lower extremities at this time  PERTINENT HISTORY:  Melissa Erickson is a 41 y.o. female presents with multiple months and years of right lower back pain that is centered around the posterior obliques as well as latissimus muscle on the right side.  She is here today for further assessment as she went to her OB/GYN provider and was told that she may have a herniated disc.  Denies any pain radiating down the leg.  Has not had any physical therapy or injections for the leg although she has previously had dry needling on the left shoulder which helped her significantly.  She is here today for further assessment.  She does use a heating pad which helps somewhat  PAIN:  Are you having pain? Yes: NPRS scale: 10/10 Pain location: R low back Pain description: sharp electric  Aggravating factors: prolonged standing, walking Relieving factors: nothing consistent  PRECAUTIONS: None  WEIGHT BEARING RESTRICTIONS: No  FALLS:  Has patient fallen in last 6 months? No  LIVING ENVIRONMENT: Lives with: lives with their family  OCCUPATION: [employer]  PLOF: Independent  PATIENT GOALS: To get some relief of my pain  NEXT MD VISIT:   OBJECTIVE:   DIAGNOSTIC FINDINGS:  None found  PATIENT SURVEYS:  FOTO 42(60 predicted)  SCREENING FOR RED FLAGS: negative  COGNITION: Overall cognitive status: Within functional limits for tasks assessed     SENSATION: Not tested  MUSCLE LENGTH: Hamstrings: Right 80 deg; Left 80 deg  POSTURE: rounded shoulders  PALPATION: Tender to R lumbosacral junction   LUMBAR ROM:   AROM eval  Flexion 75%  Extension 50%  Right lateral flexion 50% P!  Left lateral flexion 75%  Right rotation   Left rotation    (Blank rows = not tested)  LOWER EXTREMITY ROM:     Passive  Right eval Left eval  Hip flexion 110d 110d  Hip extension    Hip abduction    Hip adduction    Hip internal rotation    Hip external  rotation    Knee flexion [facility] WFL  Knee extension [facility]  Ankle dorsiflexion    Ankle plantarflexion    Ankle inversion    Ankle eversion     (Blank rows = not tested)  LOWER EXTREMITY MMT:    MMT Right eval Left eval  Hip flexion 4+ 4+  Hip extension 4 4  Hip abduction 4 4  Hip adduction    Hip internal rotation    Hip external rotation    Knee flexion 4+ 4+  Knee extension 4 4  Ankle dorsiflexion    Ankle plantarflexion    Ankle inversion    Ankle eversion    core 3 3   (Blank rows = not tested)  LUMBAR SPECIAL TESTS:  Straight leg raise test: Negative, Slump test: Negative, SI Compression/distraction test: Negative, and FABER test: Positive  FUNCTIONAL TESTS:  5 times sit to stand: TBD based on pain  GAIT: Distance walked: 29f x2 Assistive device utilized: None Level of  assistance: Complete Independence Comments: unremarkable  TODAY'S TREATMENT:                                                                                                                              DATE: 01/14/22 Eval and HEP   PATIENT EDUCATION:  Education details: Discussed eval findings, rehab rationale and POC and patient is in agreement  Person educated: Patient Education method: Explanation Education comprehension: verbalized understanding  HOME EXERCISE PROGRAM: Access Code: [email or web address] URL: [email or web address] Date: 01/14/2022 Prepared by: Sharlynn Oliphant  Exercises - Supine Posterior Pelvic Tilt  - 2 x daily - 5 x weekly - 2 sets - 10 reps - 3s hold - Supine 90/90 Abdominal Bracing  - 2 x daily - 5 x weekly - 2 sets - 2 reps - 30s hold - Curl Up with Arms Crossed  - 2 x daily - 5 x weekly - 2 sets - 10 reps  ASSESSMENT:  CLINICAL IMPRESSION: Patient is a 41 y.o. female who was seen today for physical therapy evaluation and treatment for R sided back pain. Patient unable to identify distinct aggravating or relieving factors.  Nerve tension signs do not  reproduce patient's pain however pain is produced with right sidebending suggesting facet joint involvement.  Palpation is unremarkable due in part to body habitus.  Lumbar range of motion is restricted and strength deficits were noted in hips knees and especially core.  Patient would benefit from physical therapy intervention which would include combination of land-based therapy as well as aquatic therapy sessions to improve overall mobility and core and lower extremity strength  OBJECTIVE IMPAIRMENTS: decreased activity tolerance, decreased knowledge of condition, decreased mobility, decreased ROM, decreased strength, impaired flexibility, obesity, and pain.   ACTIVITY LIMITATIONS: lifting, bending, sitting, standing, sleeping, and bed mobility  PERSONAL FACTORS: Fitness and Time since onset of injury/illness/exacerbation are also affecting patient's functional outcome.   REHAB POTENTIAL: Good  CLINICAL DECISION MAKING: Stable/uncomplicated  EVALUATION COMPLEXITY: Low   GOALS: Goals reviewed with patient? No  SHORT TERM GOALS: Target date: 01/31/22  Patient to demonstrate independence in HEP  Baseline:[id number] Goal status: INITIAL  2.  Assess 5x STS test and establish goal as needed. Baseline: UTA due to pain Goal status: INITIAL  3.  Increase core strength to 3+ as evidenced by ability to perform 15 curl ups Baseline: 7 Goal status: INITIAL   LONG TERM GOALS: Target date: 02/28/22   Increase FOTO score to 60  Baseline: 42 Goal status: INITIAL  2.  Increase core strength to 3+/5 Baseline: 3/5 Goal status: INITIAL  3.  Increase R SB ROM to 75% Baseline: 50% Goal status: INITIAL  4.  Decrease worst pain to 6/10 Baseline: 10/10 Goal status: INITIAL    PLAN:  PT FREQUENCY: 1-2x/week  PT DURATION: 6 weeks  PLANNED INTERVENTIONS: Therapeutic exercises, Therapeutic activity, Neuromuscular re-education, Balance training, Gait training, Patient/Family education,  Self Care, Joint mobilization, Stair training, Aquatic Therapy, Dry Needling, Electrical  stimulation, Manual therapy, and Re-evaluation.  PLAN FOR NEXT SESSION: HEP review and update, TPDN as appropriate, stretching, core strength and stabilization tasks, flexibility activities      Lanice Shirts, PT 01/15/2022, 7:53 AM

## 2022-01-14 NOTE — Patient Instructions (Signed)
Aquatic Therapy at [facility]-  What to Expect!  Where:   [facility] [address] [place], [place] [address] Rehab phone [phone]  NOTE:  You will receive an automated phone message reminding you of your appt and it will say the appointment is at the [address] [facility] [address] [facility] clinic.          How to Prepare: Please make sure you drink 8 ounces of water about one hour prior to your pool session A caregiver may attend if needed with the patient to help assist as needed. A caregiver can sit in the pool room on chair. Please arrive IN YOUR SUIT and 15 minutes prior to your appointment - this helps to avoid delays in starting your session. Please make sure to attend to any toileting needs prior to entering the pool Locker rooms for changing are provided.   There is direct access to the pool deck form the locker room.  You can lock your belongings in a locker with lock provided. Once on the pool deck your therapist will ask if you have signed the Patient  Consent and Assignment of Benefits form before beginning treatment Your therapist may take your blood pressure prior to, during and after your session if indicated We usually try and create a home exercise program based on activities we do in the pool.  Please be thinking about who might be able to assist you in the pool should you need to participate in an aquatic home exercise program at the time of discharge if you need assistance.  Some patients do not want to or do not have the ability to participate in an aquatic home program - this is not a barrier in any way to you participating in aquatic therapy as part of your current therapy plan! After Discharge from PT, you can continue using home program at  the [facility]/, there is a drop-in fee for $5 ($45 a month)or for 60 years  or older $4.00 ($40 a month for seniors ) or any local [employer] pool.  Memberships for purchase are  available for gym/pool at [facility]  IT IS VERY IMPORTANT THAT YOUR LAST VISIT BE IN THE CLINIC AT [address] AFTER YOUR LAST AQUATIC VISIT.  PLEASE MAKE SURE THAT YOU HAVE A [address]  APPOINTMENT SCHEDULED.   About the pool: Pool is located approximately 500 FT from the entrance of the building.  Please bring a support person if you need assistance traveling this      distance.   Your therapist will assist you in entering the water; there are two ways to           enter: stairs with railings, and a mechanical lift. Your therapist will determine the most appropriate way for you.  Water temperature is usually between 88-90 degrees  There may be up to 2 other swimmers in the pool at the same time  The pool deck is tile, please wear shoes with good traction if you prefer not to be barefoot.    Contact Info:  For appointment scheduling and cancellations:         Please call the [facility]  PH:[phone]              Aquatic Therapy  [facility]patient [facility]       All sessions are 45 minutes                                                    

## 2022-01-21 ENCOUNTER — Ambulatory Visit: Payer: BC Managed Care – PPO | Attending: Orthopaedic Surgery

## 2022-01-21 DIAGNOSIS — M5459 Other low back pain: Secondary | ICD-10-CM | POA: Insufficient documentation

## 2022-01-21 DIAGNOSIS — M6281 Muscle weakness (generalized): Secondary | ICD-10-CM | POA: Insufficient documentation

## 2022-01-21 NOTE — Therapy (Signed)
OUTPATIENT PHYSICAL THERAPY TREATMENT NOTE   Patient Name: Melissa Erickson MRN: [id number] DOB:04-12-1980, 41 y.o., female Today's Date: 01/21/2022  PCP: Leamon Arnt, MD   REFERRING PROVIDER: Vanetta Mulders, MD    END OF SESSION:    Past Medical History:  Diagnosis Date   Allergy    Asthma    Chicken pox    Environmental allergies    Seasonal   Essential hypertension 07/18/2013   Gallstones    Heart attack ([place])    NSTEMI 07/2013, demand ischemia in setting of ARDS, post-gastric sleeve    History of congestive heart failure 2015   History of pneumonia    Leiomyoma of uterus, unspecified 07/26/2019   Migraine    Polycystic ovarian syndrome    Sleep apnea    Suppurative hidradenitis 07/21/2017   Ventral hernia    Past Surgical History:  Procedure Laterality Date   BREAST BIOPSY Right 2001   Normal   CESAREAN SECTION  2007   CHOLECYSTECTOMY     ESOPHAGOGASTRODUODENOSCOPY N/A 07/19/2013   Procedure: ESOPHAGOGASTRODUODENOSCOPY (EGD);  Surgeon: Harl Bowie, MD;  Location: [place];  Service: General;  Laterality: N/A;   HYSTERECTOMY ABDOMINAL WITH SALPINGECTOMY N/A 07/26/2019   Procedure: Hysterectomy Abdominal With Salpingectomy;  Surgeon: Sanjuana Kava, MD;  Location: [place];  Service: Gynecology;  Laterality: N/A;   LAPAROSCOPIC GASTRIC SLEEVE RESECTION     LAPAROSCOPY N/A 07/19/2013   Procedure: LAPAROSCOPY DIAGNOSTIC;  Surgeon: Harl Bowie, MD;  Location: [place];  Service: General;  Laterality: N/A;   TOTAL LAPAROSCOPIC HYSTERECTOMY WITH SALPINGECTOMY Bilateral 07/26/2019   Procedure: Attempted TOTAL LAPAROSCOPIC HYSTERECTOMY WITH SALPINGECTOMY;  Surgeon: Sanjuana Kava, MD;  Location: [place];  Service: Gynecology;  Laterality: Bilateral;   Patient Active Problem List   Diagnosis Date Noted   Iron deficiency 08/23/2021   Vitamin D deficiency 08/23/2021   GAD (generalized anxiety disorder) 06/19/2021   Seasonal allergic rhinitis due to pollen 06/21/2018   PCOS  (polycystic ovarian syndrome) 07/21/2017   Suppurative hidradenitis 07/21/2017   Gastric bypass status for obesity 07/21/2017   Spondylosis of lumbar region without myelopathy or radiculopathy 07/21/2017   Primary osteoarthritis of both hips 07/21/2017   History of obstructive sleep apnea 08/08/2013   NSTEMI (non-ST elevated myocardial infarction), type 2 secondary to demand ischemia sue to sepsis 07/20/2013   History of ARDS 20/25/4270   Diastolic dysfunction without heart failure 07/18/2013   Asthma, mild intermittent 03/11/2013   Migraine 06/14/2012   Morbid obesity with BMI of 50.0-59.9, adult ([place]) 06/14/2012    REFERRING DIAG: M54.50,G89.29 (ICD-10-CM) - Chronic right-sided low back pain without sciatica    THERAPY DIAG: Chronic right-sided low back pain without sciatica    Rationale for Evaluation and Treatment Rehabilitation  PERTINENT HISTORY: Melissa Erickson is a 41 y.o. female presents with multiple months and years of right lower back pain that is centered around the posterior obliques as well as latissimus muscle on the right side.  She is here today for further assessment as she went to her OB/GYN provider and was told that she may have a herniated disc.  Denies any pain radiating down the leg.  Has not had any physical therapy or injections for the leg although she has previously had dry needling on the left shoulder which helped her significantly.  She is here today for further assessment.  She does use a heating pad which helps somewhat   PRECAUTIONS: None   SUBJECTIVE:  SUBJECTIVE STATEMENT:  less discomfort and has been compliant with her HEP   PAIN:  Are you having pain? Yes: NPRS scale: 3/10 Pain location: R low back Pain description: sharp Aggravating factors: R SB Relieving factors:  rest   OBJECTIVE: (objective measures completed at initial evaluation unless otherwise dated)   DIAGNOSTIC FINDINGS:  None found   PATIENT SURVEYS:  FOTO 42(60 predicted)   SCREENING FOR RED FLAGS: negative   COGNITION: Overall cognitive status: Within functional limits for tasks assessed                          SENSATION: Not tested   MUSCLE LENGTH: Hamstrings: Right 80 deg; Left 80 deg   POSTURE: rounded shoulders   PALPATION: Tender to R lumbosacral junction    LUMBAR ROM:    AROM eval  Flexion 75%  Extension 50%  Right lateral flexion 50% P!  Left lateral flexion 75%  Right rotation    Left rotation     (Blank rows = not tested)   LOWER EXTREMITY ROM:      Passive  Right eval Left eval  Hip flexion 110d 110d  Hip extension      Hip abduction      Hip adduction      Hip internal rotation      Hip external rotation      Knee flexion [facility] WFL  Knee extension [facility]  Ankle dorsiflexion      Ankle plantarflexion      Ankle inversion      Ankle eversion       (Blank rows = not tested)   LOWER EXTREMITY MMT:     MMT Right eval Left eval  Hip flexion 4+ 4+  Hip extension 4 4  Hip abduction 4 4  Hip adduction      Hip internal rotation      Hip external rotation      Knee flexion 4+ 4+  Knee extension 4 4  Ankle dorsiflexion      Ankle plantarflexion      Ankle inversion      Ankle eversion      core 3 3   (Blank rows = not tested)   LUMBAR SPECIAL TESTS:  Straight leg raise test: Negative, Slump test: Negative, SI Compression/distraction test: Negative, and FABER test: Positive   FUNCTIONAL TESTS:  5 times sit to stand: TBD based on pain   GAIT: Distance walked: 50f x2 Assistive device utilized: None Level of assistance: Complete Independence Comments: unremarkable   TODAY'S TREATMENT:       [facility] Adult PT Treatment:                                                DATE: 01/21/22 Therapeutic Exercise: Nustep L2 8 min PPT 3s  x10 Curlups L1 15x 90/90 30s x2 Seated hamstring stretch 30s x2 B Supine march w/PPT 30s x2 Supine hip fallout RTB 15x Bridge 15x Hip flexor stretch 30s x2 Clams RTB 15x B  DATE: 01/14/22 Eval and HEP     PATIENT EDUCATION:  Education details: Discussed eval findings, rehab rationale and POC and patient is in agreement  Person educated: Patient Education method: Explanation Education comprehension: verbalized understanding   HOME EXERCISE PROGRAM: Access Code: [email or web address] URL: [email or web address] Date: 01/14/2022 Prepared by: Sharlynn Oliphant   Exercises - Supine Posterior Pelvic Tilt  - 2 x daily - 5 x weekly - 2 sets - 10 reps - 3s hold - Supine 90/90 Abdominal Bracing  - 2 x daily - 5 x weekly - 2 sets - 2 reps - 30s hold - Curl Up with Arms Crossed  - 2 x daily - 5 x weekly - 2 sets - 10 reps   ASSESSMENT:   CLINICAL IMPRESSION: Symptoms less, has been compliant with HEP.  Today's session focused on stretching and core strengthening tasks, HEP review, posture training.  Incorporated functional core strengthening with LE movements for functional gains.  Patient much more tolerant to exercise today but core weakness and fatigue evident.  Flexibility deficits identified in B hip flexors.    OBJECTIVE IMPAIRMENTS: decreased activity tolerance, decreased knowledge of condition, decreased mobility, decreased ROM, decreased strength, impaired flexibility, obesity, and pain.    ACTIVITY LIMITATIONS: lifting, bending, sitting, standing, sleeping, and bed mobility   PERSONAL FACTORS: Fitness and Time since onset of injury/illness/exacerbation are also affecting patient's functional outcome.    REHAB POTENTIAL: Good   CLINICAL DECISION MAKING: Stable/uncomplicated   EVALUATION COMPLEXITY: Low     GOALS: Goals reviewed with patient? No   SHORT  TERM GOALS: Target date: 01/31/22   Patient to demonstrate independence in HEP  Baseline:[id number] Goal status: INITIAL   2.  Assess 5x STS test and establish goal as needed. Baseline: UTA due to pain Goal status: INITIAL   3.  Increase core strength to 3+ as evidenced by ability to perform 15 curl ups Baseline: 7 Goal status: INITIAL     LONG TERM GOALS: Target date: 02/28/22     Increase FOTO score to 60  Baseline: 42 Goal status: INITIAL   2.  Increase core strength to 3+/5 Baseline: 3/5 Goal status: INITIAL   3.  Increase R SB ROM to 75% Baseline: 50% Goal status: INITIAL   4.  Decrease worst pain to 6/10 Baseline: 10/10 Goal status: INITIAL       PLAN:   PT FREQUENCY: 1-2x/week   PT DURATION: 6 weeks   PLANNED INTERVENTIONS: Therapeutic exercises, Therapeutic activity, Neuromuscular re-education, Balance training, Gait training, Patient/Family education, Self Care, Joint mobilization, Stair training, Aquatic Therapy, Dry Needling, Electrical stimulation, Manual therapy, and Re-evaluation.   PLAN FOR NEXT SESSION: HEP review and update, TPDN as appropriate, stretching, core strength and stabilization tasks, flexibility activities      Lanice Shirts, PT 01/21/2022, 7:13 PM

## 2022-01-23 ENCOUNTER — Ambulatory Visit: Payer: BC Managed Care – PPO

## 2022-01-23 DIAGNOSIS — M5459 Other low back pain: Secondary | ICD-10-CM | POA: Diagnosis not present

## 2022-01-23 DIAGNOSIS — M6281 Muscle weakness (generalized): Secondary | ICD-10-CM

## 2022-01-23 NOTE — Therapy (Addendum)
OUTPATIENT PHYSICAL THERAPY TREATMENT NOTE/DC SUMMARY   Patient Name: Melissa Erickson MRN: [id number] DOB:1980-10-08, 41 y.o., female Today's Date: 01/23/2022  PCP: Leamon Arnt, MD   REFERRING PROVIDER: Vanetta Mulders, MD   PHYSICAL THERAPY DISCHARGE SUMMARY  Visits from Start of Care: 3  Current functional level related to goals / functional outcomes: UTA   Remaining deficits: UTA   Education / Equipment: HEP   Patient agrees to discharge. Patient goals were partially met. Patient is being discharged due to not returning since the last visit.  END OF SESSION:   PT End of Session - 01/23/22 1832     Visit Number 3    Number of Visits 12    Date for PT Re-Evaluation 03/12/22    Authorization Type [employer]    PT Start Time 1832    PT Stop Time 1911    PT Time Calculation (min) 39 min    Activity Tolerance Patient tolerated treatment well    Behavior During Therapy [facility] for tasks assessed/performed             Past Medical History:  Diagnosis Date   Allergy    Asthma    Chicken pox    Environmental allergies    Seasonal   Essential hypertension 07/18/2013   Gallstones    Heart attack ([place])    NSTEMI 07/2013, demand ischemia in setting of ARDS, post-gastric sleeve    History of congestive heart failure 2015   History of pneumonia    Leiomyoma of uterus, unspecified 07/26/2019   Migraine    Polycystic ovarian syndrome    Sleep apnea    Suppurative hidradenitis 07/21/2017   Ventral hernia    Past Surgical History:  Procedure Laterality Date   BREAST BIOPSY Right 2001   Normal   CESAREAN SECTION  2007   CHOLECYSTECTOMY     ESOPHAGOGASTRODUODENOSCOPY N/A 07/19/2013   Procedure: ESOPHAGOGASTRODUODENOSCOPY (EGD);  Surgeon: Harl Bowie, MD;  Location: [place];  Service: General;  Laterality: N/A;   HYSTERECTOMY ABDOMINAL WITH SALPINGECTOMY N/A 07/26/2019   Procedure: Hysterectomy Abdominal With Salpingectomy;  Surgeon: Sanjuana Kava, MD;  Location: [place];  Service:  Gynecology;  Laterality: N/A;   LAPAROSCOPIC GASTRIC SLEEVE RESECTION     LAPAROSCOPY N/A 07/19/2013   Procedure: LAPAROSCOPY DIAGNOSTIC;  Surgeon: Harl Bowie, MD;  Location: [place];  Service: General;  Laterality: N/A;   TOTAL LAPAROSCOPIC HYSTERECTOMY WITH SALPINGECTOMY Bilateral 07/26/2019   Procedure: Attempted TOTAL LAPAROSCOPIC HYSTERECTOMY WITH SALPINGECTOMY;  Surgeon: Sanjuana Kava, MD;  Location: [place];  Service: Gynecology;  Laterality: Bilateral;   Patient Active Problem List   Diagnosis Date Noted   Iron deficiency 08/23/2021   Vitamin D deficiency 08/23/2021   GAD (generalized anxiety disorder) 06/19/2021   Seasonal allergic rhinitis due to pollen 06/21/2018   PCOS (polycystic ovarian syndrome) 07/21/2017   Suppurative hidradenitis 07/21/2017   Gastric bypass status for obesity 07/21/2017   Spondylosis of lumbar region without myelopathy or radiculopathy 07/21/2017   Primary osteoarthritis of both hips 07/21/2017   History of obstructive sleep apnea 08/08/2013   NSTEMI (non-ST elevated myocardial infarction), type 2 secondary to demand ischemia sue to sepsis 07/20/2013   History of ARDS 24/23/5361   Diastolic dysfunction without heart failure 07/18/2013   Asthma, mild intermittent 03/11/2013   Migraine 06/14/2012   Morbid obesity with BMI of 50.0-59.9, adult ([place]) 06/14/2012    REFERRING DIAG: M54.50,G89.29 (ICD-10-CM) - Chronic right-sided low back pain without sciatica    THERAPY DIAG: Chronic  right-sided low back pain without sciatica    Rationale for Evaluation and Treatment Rehabilitation  PERTINENT HISTORY: Melissa Erickson is a 41 y.o. female presents with multiple months and years of right lower back pain that is centered around the posterior obliques as well as latissimus muscle on the right side.  She is here today for further assessment as she went to her OB/GYN provider and was told that she may have a herniated disc.  Denies any pain radiating down the leg.   Has not had any physical therapy or injections for the leg although she has previously had dry needling on the left shoulder which helped her significantly.  She is here today for further assessment.  She does use a heating pad which helps somewhat   PRECAUTIONS: None   SUBJECTIVE:                                                                                                                                                                                      SUBJECTIVE STATEMENT:  Pt presents to PT with continued reports of pain or discomfort. Notes HEP compliance. Ready to begin PT at this time.    PAIN:  Are you having pain? Yes: NPRS scale: 6/10 Pain location: R low back Pain description: sharp Aggravating factors: R SB Relieving factors: rest   OBJECTIVE: (objective measures completed at initial evaluation unless otherwise dated)   DIAGNOSTIC FINDINGS:  None found   PATIENT SURVEYS:  FOTO 42(60 predicted)   SCREENING FOR RED FLAGS: negative   COGNITION: Overall cognitive status: Within functional limits for tasks assessed                          SENSATION: Not tested   MUSCLE LENGTH: Hamstrings: Right 80 deg; Left 80 deg   POSTURE: rounded shoulders   PALPATION: Tender to R lumbosacral junction    LUMBAR ROM:    AROM eval  Flexion 75%  Extension 50%  Right lateral flexion 50% P!  Left lateral flexion 75%  Right rotation    Left rotation     (Blank rows = not tested)   LOWER EXTREMITY ROM:      Passive  Right eval Left eval  Hip flexion 110d 110d  Hip extension      Hip abduction      Hip adduction      Hip internal rotation      Hip external rotation      Knee flexion [facility]  Knee extension [facility]  Ankle dorsiflexion      Ankle plantarflexion      Ankle inversion  Ankle eversion       (Blank rows = not tested)   LOWER EXTREMITY MMT:     MMT Right eval Left eval  Hip flexion 4+ 4+  Hip extension 4 4  Hip abduction 4 4  Hip  adduction      Hip internal rotation      Hip external rotation      Knee flexion 4+ 4+  Knee extension 4 4  Ankle dorsiflexion      Ankle plantarflexion      Ankle inversion      Ankle eversion      core 3 3   (Blank rows = not tested)   LUMBAR SPECIAL TESTS:  Straight leg raise test: Negative, Slump test: Negative, SI Compression/distraction test: Negative, and FABER test: Positive   FUNCTIONAL TESTS:  5 times sit to stand: TBD based on pain   GAIT: Distance walked: 75f x2 Assistive device utilized: None Level of assistance: Complete Independence Comments: unremarkable   TODAY'S TREATMENT:       [facility] Adult PT Treatment:                                                DATE: 01/23/2022 Therapeutic Exercise: Nustep L5 x 5 min while taking subjective Bridge 2x10 PPT 3s x10 PPT with ball 2x10 3s Seated hamstring stretch 2x30" STS 2x10 Seated hamstring 2x10 GTB LAQ 2x10 4# Seated clamshell GTB  [facility] Adult PT Treatment:                                                DATE: 01/21/2022 Therapeutic Exercise: Nustep L2 8 min PPT 3s x10 Curlups L1 15x 90/90 30s x2 Seated hamstring stretch 30s x2 B Supine march w/PPT 30s x2 Supine hip fallout RTB 15x Bridge 15x Hip flexor stretch 30s x2 Clams RTB 15x B                                                                                                                        PATIENT EDUCATION:  Education details: Discussed eval findings, rehab rationale and POC and patient is in agreement  Person educated: Patient Education method: Explanation Education comprehension: verbalized understanding   HOME EXERCISE PROGRAM: Access Code: [email or web address]: [email or web address] Date: 01/14/2022 Prepared by: JSharlynn Oliphant  Exercises - Supine Posterior Pelvic Tilt  - 2 x daily - 5 x weekly - 2 sets - 10 reps - 3s hold - Supine 90/90 Abdominal Bracing  - 2 x daily - 5 x weekly - 2 sets - 2 reps - 30s hold - Curl Up with  Arms Crossed  - 2 x daily - 5 x weekly - 2 sets - 10 reps  ASSESSMENT:   CLINICAL IMPRESSION:  Pt was able to complete all prescribed exercises with no adverse effect. Therapy focused on improving core and proximal hip strength in order to decrease pain and improve comfort. She is progressing well with therapy, will continue to progress as able per POC.     OBJECTIVE IMPAIRMENTS: decreased activity tolerance, decreased knowledge of condition, decreased mobility, decreased ROM, decreased strength, impaired flexibility, obesity, and pain.    ACTIVITY LIMITATIONS: lifting, bending, sitting, standing, sleeping, and bed mobility   PERSONAL FACTORS: Fitness and Time since onset of injury/illness/exacerbation are also affecting patient's functional outcome.      GOALS: Goals reviewed with patient? No   SHORT TERM GOALS: Target date: 01/31/22   Patient to demonstrate independence in HEP  Baseline:[id number] Goal status: INITIAL   2.  Assess 5x STS test and establish goal as needed. Baseline: UTA due to pain Goal status: INITIAL   3.  Increase core strength to 3+ as evidenced by ability to perform 15 curl ups Baseline: 7 Goal status: INITIAL     LONG TERM GOALS: Target date: 02/28/22     Increase FOTO score to 60  Baseline: 42 Goal status: INITIAL   2.  Increase core strength to 3+/5 Baseline: 3/5 Goal status: INITIAL   3.  Increase R SB ROM to 75% Baseline: 50% Goal status: INITIAL   4.  Decrease worst pain to 6/10 Baseline: 10/10 Goal status: INITIAL       PLAN:   PT FREQUENCY: 1-2x/week   PT DURATION: 6 weeks   PLANNED INTERVENTIONS: Therapeutic exercises, Therapeutic activity, Neuromuscular re-education, Balance training, Gait training, Patient/Family education, Self Care, Joint mobilization, Stair training, Aquatic Therapy, Dry Needling, Electrical stimulation, Manual therapy, and Re-evaluation.   PLAN FOR NEXT SESSION: HEP review and update, TPDN as  appropriate, stretching, core strength and stabilization tasks, flexibility activities      Ward Chatters, PT 01/23/2022, 7:13 PM

## 2022-01-28 ENCOUNTER — Ambulatory Visit: Payer: BC Managed Care – PPO

## 2022-01-28 NOTE — Therapy (Incomplete)
OUTPATIENT PHYSICAL THERAPY TREATMENT NOTE   Patient Name: Melissa Erickson MRN: [id number] DOB:08/24/1980, 41 y.o., female Today's Date: 01/28/2022  PCP: Melissa Arnt, MD   REFERRING PROVIDER: Vanetta Mulders, MD    END OF SESSION:     Past Medical History:  Diagnosis Date   Allergy    Asthma    Chicken pox    Environmental allergies    Seasonal   Essential hypertension 07/18/2013   Gallstones    Heart attack ([place])    NSTEMI 07/2013, demand ischemia in setting of ARDS, post-gastric sleeve    History of congestive heart failure 2015   History of pneumonia    Leiomyoma of uterus, unspecified 07/26/2019   Migraine    Polycystic ovarian syndrome    Sleep apnea    Suppurative hidradenitis 07/21/2017   Ventral hernia    Past Surgical History:  Procedure Laterality Date   BREAST BIOPSY Right 2001   Normal   CESAREAN SECTION  2007   CHOLECYSTECTOMY     ESOPHAGOGASTRODUODENOSCOPY N/A 07/19/2013   Procedure: ESOPHAGOGASTRODUODENOSCOPY (EGD);  Surgeon: Melissa Bowie, MD;  Location: [place];  Service: General;  Laterality: N/A;   HYSTERECTOMY ABDOMINAL WITH SALPINGECTOMY N/A 07/26/2019   Procedure: Hysterectomy Abdominal With Salpingectomy;  Surgeon: Melissa Kava, MD;  Location: [place];  Service: Gynecology;  Laterality: N/A;   LAPAROSCOPIC GASTRIC SLEEVE RESECTION     LAPAROSCOPY N/A 07/19/2013   Procedure: LAPAROSCOPY DIAGNOSTIC;  Surgeon: Melissa Bowie, MD;  Location: [place];  Service: General;  Laterality: N/A;   TOTAL LAPAROSCOPIC HYSTERECTOMY WITH SALPINGECTOMY Bilateral 07/26/2019   Procedure: Attempted TOTAL LAPAROSCOPIC HYSTERECTOMY WITH SALPINGECTOMY;  Surgeon: Melissa Kava, MD;  Location: [place];  Service: Gynecology;  Laterality: Bilateral;   Patient Active Problem List   Diagnosis Date Noted   Iron deficiency 08/23/2021   Vitamin D deficiency 08/23/2021   GAD (generalized anxiety disorder) 06/19/2021   Seasonal allergic rhinitis due to pollen 06/21/2018   PCOS  (polycystic ovarian syndrome) 07/21/2017   Suppurative hidradenitis 07/21/2017   Gastric bypass status for obesity 07/21/2017   Spondylosis of lumbar region without myelopathy or radiculopathy 07/21/2017   Primary osteoarthritis of both hips 07/21/2017   History of obstructive sleep apnea 08/08/2013   NSTEMI (non-ST elevated myocardial infarction), type 2 secondary to demand ischemia sue to sepsis 07/20/2013   History of ARDS 03/47/4259   Diastolic dysfunction without heart failure 07/18/2013   Asthma, mild intermittent 03/11/2013   Migraine 06/14/2012   Morbid obesity with BMI of 50.0-59.9, adult ([place]) 06/14/2012    REFERRING DIAG: M54.50,G89.29 (ICD-10-CM) - Chronic right-sided low back pain without sciatica    THERAPY DIAG: Chronic right-sided low back pain without sciatica    Rationale for Evaluation and Treatment Rehabilitation  PERTINENT HISTORY: Melissa Erickson is a 41 y.o. female presents with multiple months and years of right lower back pain that is centered around the posterior obliques as well as latissimus muscle on the right side.  She is here today for further assessment as she went to her OB/GYN provider and was told that she may have a herniated disc.  Denies any pain radiating down the leg.  Has not had any physical therapy or injections for the leg although she has previously had dry needling on the left shoulder which helped her significantly.  She is here today for further assessment.  She does use a heating pad which helps somewhat   PRECAUTIONS: None   SUBJECTIVE:  SUBJECTIVE STATEMENT:  *** Pt presents to PT with continued reports of pain or discomfort. Notes HEP compliance. Ready to begin PT at this time.    PAIN:  Are you having pain? Yes: NPRS scale: 6/10 Pain location: R low  back Pain description: sharp Aggravating factors: R SB Relieving factors: rest   OBJECTIVE: (objective measures completed at initial evaluation unless otherwise dated)   DIAGNOSTIC FINDINGS:  None found   PATIENT SURVEYS:  FOTO 42(60 predicted)   SCREENING FOR RED FLAGS: negative   COGNITION: Overall cognitive status: Within functional limits for tasks assessed                          SENSATION: Not tested   MUSCLE LENGTH: Hamstrings: Right 80 deg; Left 80 deg   POSTURE: rounded shoulders   PALPATION: Tender to R lumbosacral junction    LUMBAR ROM:    AROM eval  Flexion 75%  Extension 50%  Right lateral flexion 50% P!  Left lateral flexion 75%  Right rotation    Left rotation     (Blank rows = not tested)   LOWER EXTREMITY ROM:      Passive  Right eval Left eval  Hip flexion 110d 110d  Hip extension      Hip abduction      Hip adduction      Hip internal rotation      Hip external rotation      Knee flexion [facility] WFL  Knee extension [facility]  Ankle dorsiflexion      Ankle plantarflexion      Ankle inversion      Ankle eversion       (Blank rows = not tested)   LOWER EXTREMITY MMT:     MMT Right eval Left eval  Hip flexion 4+ 4+  Hip extension 4 4  Hip abduction 4 4  Hip adduction      Hip internal rotation      Hip external rotation      Knee flexion 4+ 4+  Knee extension 4 4  Ankle dorsiflexion      Ankle plantarflexion      Ankle inversion      Ankle eversion      core 3 3   (Blank rows = not tested)   LUMBAR SPECIAL TESTS:  Straight leg raise test: Negative, Slump test: Negative, SI Compression/distraction test: Negative, and FABER test: Positive   FUNCTIONAL TESTS:  5 times sit to stand: TBD based on pain   GAIT: Distance walked: 18f x2 Assistive device utilized: None Level of assistance: Complete Independence Comments: unremarkable   TODAY'S TREATMENT:       [facility] Adult PT Treatment:                                                 DATE: 01/28/2022 Therapeutic Exercise: Nustep L5 x 5 min while taking subjective Palloff press? Standing hip abduction/extension? Bridge 2x10 PPT 3s x10 PPT with ball 2x10 3s Seated hamstring stretch 2x30" STS 2x10 Seated hamstring 2x10 GTB LAQ 2x10 4# Seated clamshell GTB   [facility] Adult PT Treatment:  DATE: 01/23/2022 Therapeutic Exercise: Nustep L5 x 5 min while taking subjective Bridge 2x10 PPT 3s x10 PPT with ball 2x10 3s Seated hamstring stretch 2x30" STS 2x10 Seated hamstring 2x10 GTB LAQ 2x10 4# Seated clamshell GTB  [facility] Adult PT Treatment:                                                DATE: 01/21/2022 Therapeutic Exercise: Nustep L2 8 min PPT 3s x10 Curlups L1 15x 90/90 30s x2 Seated hamstring stretch 30s x2 B Supine march w/PPT 30s x2 Supine hip fallout RTB 15x Bridge 15x Hip flexor stretch 30s x2 Clams RTB 15x B                                                                                                                        PATIENT EDUCATION:  Education details: Discussed eval findings, rehab rationale and POC and patient is in agreement  Person educated: Patient Education method: Explanation Education comprehension: verbalized understanding   HOME EXERCISE PROGRAM: Access Code: [email or web address] URL: [email or web address] Date: 01/14/2022 Prepared by: Sharlynn Oliphant   Exercises - Supine Posterior Pelvic Tilt  - 2 x daily - 5 x weekly - 2 sets - 10 reps - 3s hold - Supine 90/90 Abdominal Bracing  - 2 x daily - 5 x weekly - 2 sets - 2 reps - 30s hold - Curl Up with Arms Crossed  - 2 x daily - 5 x weekly - 2 sets - 10 reps   ASSESSMENT:   CLINICAL IMPRESSION:  ***  Pt was able to complete all prescribed exercises with no adverse effect. Therapy focused on improving core and proximal hip strength in order to decrease pain and improve comfort. She is progressing well with therapy, will  continue to progress as able per POC.     OBJECTIVE IMPAIRMENTS: decreased activity tolerance, decreased knowledge of condition, decreased mobility, decreased ROM, decreased strength, impaired flexibility, obesity, and pain.    ACTIVITY LIMITATIONS: lifting, bending, sitting, standing, sleeping, and bed mobility   PERSONAL FACTORS: Fitness and Time since onset of injury/illness/exacerbation are also affecting patient's functional outcome.      GOALS: Goals reviewed with patient? No   SHORT TERM GOALS: Target date: 01/31/22   Patient to demonstrate independence in HEP  Baseline:[id number] Goal status: INITIAL   2.  Assess 5x STS test and establish goal as needed. Baseline: UTA due to pain Goal status: INITIAL   3.  Increase core strength to 3+ as evidenced by ability to perform 15 curl ups Baseline: 7 Goal status: INITIAL     LONG TERM GOALS: Target date: 02/28/22     Increase FOTO score to 60  Baseline: 42 Goal status: INITIAL   2.  Increase core strength to 3+/5 Baseline: 3/5 Goal status: INITIAL   3.  Increase R SB ROM to 75%  Baseline: 50% Goal status: INITIAL   4.  Decrease worst pain to 6/10 Baseline: 10/10 Goal status: INITIAL       PLAN:   PT FREQUENCY: 1-2x/week   PT DURATION: 6 weeks   PLANNED INTERVENTIONS: Therapeutic exercises, Therapeutic activity, Neuromuscular re-education, Balance training, Gait training, Patient/Family education, Self Care, Joint mobilization, Stair training, Aquatic Therapy, Dry Needling, Electrical stimulation, Manual therapy, and Re-evaluation.   PLAN FOR NEXT SESSION: HEP review and update, TPDN as appropriate, stretching, core strength and stabilization tasks, flexibility activities      Margarette Canada, PTA 01/28/2022, 8:45 AM

## 2022-01-30 ENCOUNTER — Ambulatory Visit: Payer: BC Managed Care – PPO

## 2022-02-04 ENCOUNTER — Ambulatory Visit: Payer: BC Managed Care – PPO

## 2022-02-05 NOTE — Therapy (Incomplete)
OUTPATIENT PHYSICAL THERAPY TREATMENT NOTE   Patient Name: Melissa Erickson MRN: [id number] DOB:[birth year], 41 y.o., female Today's Date: 02/05/2022  PCP: Leamon Arnt, MD   REFERRING PROVIDER: Vanetta Mulders, MD    END OF SESSION:     Past Medical History:  Diagnosis Date   Allergy    Asthma    Chicken pox    Environmental allergies    Seasonal   Essential hypertension 07/18/2013   Gallstones    Heart attack ([place])    NSTEMI 07/2013, demand ischemia in setting of ARDS, post-gastric sleeve    History of congestive heart failure 2015   History of pneumonia    Leiomyoma of uterus, unspecified 07/26/2019   Migraine    Polycystic ovarian syndrome    Sleep apnea    Suppurative hidradenitis 07/21/2017   Ventral hernia    Past Surgical History:  Procedure Laterality Date   BREAST BIOPSY Right 2001   Normal   CESAREAN SECTION  2007   CHOLECYSTECTOMY     ESOPHAGOGASTRODUODENOSCOPY N/A 07/19/2013   Procedure: ESOPHAGOGASTRODUODENOSCOPY (EGD);  Surgeon: Harl Bowie, MD;  Location: [place];  Service: General;  Laterality: N/A;   HYSTERECTOMY ABDOMINAL WITH SALPINGECTOMY N/A 07/26/2019   Procedure: Hysterectomy Abdominal With Salpingectomy;  Surgeon: Sanjuana Kava, MD;  Location: [place];  Service: Gynecology;  Laterality: N/A;   LAPAROSCOPIC GASTRIC SLEEVE RESECTION     LAPAROSCOPY N/A 07/19/2013   Procedure: LAPAROSCOPY DIAGNOSTIC;  Surgeon: Harl Bowie, MD;  Location: [employer];  Service: General;  Laterality: N/A;   TOTAL LAPAROSCOPIC HYSTERECTOMY WITH SALPINGECTOMY Bilateral 07/26/2019   Procedure: Attempted TOTAL LAPAROSCOPIC HYSTERECTOMY WITH SALPINGECTOMY;  Surgeon: Sanjuana Kava, MD;  Location: [place];  Service: Gynecology;  Laterality: Bilateral;   Patient Active Problem List   Diagnosis Date Noted   Iron deficiency 08/23/2021   Vitamin D deficiency 08/23/2021   GAD (generalized anxiety disorder) 06/19/2021   Seasonal allergic rhinitis due to pollen 06/21/2018   PCOS  (polycystic ovarian syndrome) 07/21/2017   Suppurative hidradenitis 07/21/2017   Gastric bypass status for obesity 07/21/2017   Spondylosis of lumbar region without myelopathy or radiculopathy 07/21/2017   Primary osteoarthritis of both hips 07/21/2017   History of obstructive sleep apnea 08/08/2013   NSTEMI (non-ST elevated myocardial infarction), type 2 secondary to demand ischemia sue to sepsis 07/20/2013   History of ARDS 42/59/5638   Diastolic dysfunction without heart failure 07/18/2013   Asthma, mild intermittent 03/11/2013   Migraine 06/14/2012   Morbid obesity with BMI of 50.0-59.9, adult ([place]) 06/14/2012    REFERRING DIAG: M54.50,G89.29 (ICD-10-CM) - Chronic right-sided low back pain without sciatica    THERAPY DIAG: Chronic right-sided low back pain without sciatica    Rationale for Evaluation and Treatment Rehabilitation  PERTINENT HISTORY: Melissa Erickson is a 41 y.o. female presents with multiple months and years of right lower back pain that is centered around the posterior obliques as well as latissimus muscle on the right side.  She is here today for further assessment as she went to her OB/GYN provider and was told that she may have a herniated disc.  Denies any pain radiating down the leg.  Has not had any physical therapy or injections for the leg although she has previously had dry needling on the left shoulder which helped her significantly.  She is here today for further assessment.  She does use a heating pad which helps somewhat   PRECAUTIONS: None   SUBJECTIVE:  SUBJECTIVE STATEMENT:  *** Pt presents to PT with continued reports of pain or discomfort. Notes HEP compliance. Ready to begin PT at this time.    PAIN:  Are you having pain? Yes: NPRS scale: ***6/10 Pain location: R low  back Pain description: sharp Aggravating factors: R SB Relieving factors: rest   OBJECTIVE: (objective measures completed at initial evaluation unless otherwise dated)   DIAGNOSTIC FINDINGS:  None found   PATIENT SURVEYS:  FOTO 42(60 predicted)   SCREENING FOR RED FLAGS: negative   COGNITION: Overall cognitive status: Within functional limits for tasks assessed                          SENSATION: Not tested   MUSCLE LENGTH: Hamstrings: Right 80 deg; Left 80 deg   POSTURE: rounded shoulders   PALPATION: Tender to R lumbosacral junction    LUMBAR ROM:    AROM eval  Flexion 75%  Extension 50%  Right lateral flexion 50% P!  Left lateral flexion 75%  Right rotation    Left rotation     (Blank rows = not tested)   LOWER EXTREMITY ROM:      Passive  Right eval Left eval  Hip flexion 110d 110d  Hip extension      Hip abduction      Hip adduction      Hip internal rotation      Hip external rotation      Knee flexion [facility] WFL  Knee extension [facility]  Ankle dorsiflexion      Ankle plantarflexion      Ankle inversion      Ankle eversion       (Blank rows = not tested)   LOWER EXTREMITY MMT:     MMT Right eval Left eval  Hip flexion 4+ 4+  Hip extension 4 4  Hip abduction 4 4  Hip adduction      Hip internal rotation      Hip external rotation      Knee flexion 4+ 4+  Knee extension 4 4  Ankle dorsiflexion      Ankle plantarflexion      Ankle inversion      Ankle eversion      core 3 3   (Blank rows = not tested)   LUMBAR SPECIAL TESTS:  Straight leg raise test: Negative, Slump test: Negative, SI Compression/distraction test: Negative, and FABER test: Positive   FUNCTIONAL TESTS:  5 times sit to stand: TBD based on pain   GAIT: Distance walked: 26f x2 Assistive device utilized: None Level of assistance: Complete Independence Comments: unremarkable   TODAY'S TREATMENT:       [facility] Adult PT Treatment:                                                 DATE: 02/07/2022 [address]atic [address] - therapeutic pool temp *** degrees Pt enters building ***  Treatment took place in water 3.8 to  4 ft 8 in.feet deep depending upon activity.  Pt entered and exited the pool via stair and handrails ***  Pt pain level *** at initiation of water walking. Patient entered water for aquatic therapy for first time and was introduced to principles and therapeutic effects of water as they ambulated and acclimated to pool.  Therapeutic Exercise: Walking forward/backwards/side stepping Lunge stepping forwards x2 laps Side stepping lunge x2 laps Runners stretch on bottom step x30" BIL Hamstring stretch on bottom step x30" BIL Figure 4 squat stretch, BIL UE support 2x30" BIL Standing thoracic rotation with hands in neutral for resistance x10 BIL STS from 3rd step from bottom At edge of pool, pt performed LE exercise: Hip abd/add x20 BIL Hip ext/flex with knee straight x 20 BIL Hip Circles CC/CCW 2x10 each BIL Marching hip flexion to knee extension 2x10 BIL Hamstring curl x20 BIL Squats 2x20 Step ups on submerged step 2x10 BIL Step up and overs on submerged step 2x10 BIL Sitting on bench in water: Bicycle kicks x1' Reverse bicycle kicks x1' Flutter kicks x1' Scissor kicks x1' Kickboard push/pull x1' Kickboard push downs x1'  Pt requires the buoyancy of water for active assisted exercises with buoyancy supported for strengthening and AROM exercises. Hydrostatic pressure also supports joints by unweighting joint load by at least 50 % in 3-4 feet depth water. 80% in chest to neck deep water. Water will provide assistance with movement using the current and laminar flow while the buoyancy reduces weight bearing. Pt requires the viscosity of the water for resistance with strengthening exercises.   [place] Adult PT Treatment:                                                DATE: 01/23/2022 Therapeutic Exercise: Nustep L5 x 5  min while taking subjective Bridge 2x10 PPT 3s x10 PPT with ball 2x10 3s Seated hamstring stretch 2x30" STS 2x10 Seated hamstring 2x10 GTB LAQ 2x10 4# Seated clamshell GTB  [facility] Adult PT Treatment:                                                DATE: 01/21/2022 Therapeutic Exercise: Nustep L2 8 min PPT 3s x10 Curlups L1 15x 90/90 30s x2 Seated hamstring stretch 30s x2 B Supine march w/PPT 30s x2 Supine hip fallout RTB 15x Bridge 15x Hip flexor stretch 30s x2 Clams RTB 15x B                                                                                                                        PATIENT EDUCATION:  Education details: Discussed eval findings, rehab rationale and POC and patient is in agreement  Person educated: Patient Education method: Explanation Education comprehension: verbalized understanding   HOME EXERCISE PROGRAM: Access Code: [email or web address] URL: [email or web address] Date: 01/14/2022 Prepared by: Sharlynn Oliphant   Exercises - Supine Posterior Pelvic Tilt  - 2 x daily - 5 x weekly - 2 sets - 10 reps - 3s hold - Supine 90/90 Abdominal Bracing  - 2 x daily - 5  x weekly - 2 sets - 2 reps - 30s hold - Curl Up with Arms Crossed  - 2 x daily - 5 x weekly - 2 sets - 10 reps   ASSESSMENT:   CLINICAL IMPRESSION:  ***  Pt was able to complete all prescribed exercises with no adverse effect. Therapy focused on improving core and proximal hip strength in order to decrease pain and improve comfort. She is progressing well with therapy, will continue to progress as able per POC.     OBJECTIVE IMPAIRMENTS: decreased activity tolerance, decreased knowledge of condition, decreased mobility, decreased ROM, decreased strength, impaired flexibility, obesity, and pain.    ACTIVITY LIMITATIONS: lifting, bending, sitting, standing, sleeping, and bed mobility   PERSONAL FACTORS: Fitness and Time since onset of injury/illness/exacerbation are also affecting  patient's functional outcome.      GOALS: Goals reviewed with patient? No   SHORT TERM GOALS: Target date: 01/31/22   Patient to demonstrate independence in HEP  Baseline:[id number] Goal status: INITIAL   2.  Assess 5x STS test and establish goal as needed. Baseline: UTA due to pain Goal status: INITIAL   3.  Increase core strength to 3+ as evidenced by ability to perform 15 curl ups Baseline: 7 Goal status: INITIAL     LONG TERM GOALS: Target date: 02/28/22     Increase FOTO score to 60  Baseline: 42 Goal status: INITIAL   2.  Increase core strength to 3+/5 Baseline: 3/5 Goal status: INITIAL   3.  Increase R SB ROM to 75% Baseline: 50% Goal status: INITIAL   4.  Decrease worst pain to 6/10 Baseline: 10/10 Goal status: INITIAL       PLAN:   PT FREQUENCY: 1-2x/week   PT DURATION: 6 weeks   PLANNED INTERVENTIONS: Therapeutic exercises, Therapeutic activity, Neuromuscular re-education, Balance training, Gait training, Patient/Family education, Self Care, Joint mobilization, Stair training, Aquatic Therapy, Dry Needling, Electrical stimulation, Manual therapy, and Re-evaluation.   PLAN FOR NEXT SESSION: HEP review and update, TPDN as appropriate, stretching, core strength and stabilization tasks, flexibility activities      Margarette Canada, PTA 02/05/2022, 6:44 PM

## 2022-02-07 ENCOUNTER — Ambulatory Visit: Payer: BC Managed Care – PPO

## 2022-02-11 ENCOUNTER — Telehealth: Payer: Self-pay

## 2022-02-11 ENCOUNTER — Ambulatory Visit: Payer: BC Managed Care – PPO

## 2022-02-11 NOTE — Telephone Encounter (Signed)
Left message for pt regarding her first no show. Discussed attendance policy, confirmed next appointment, and provided clinic phone number.

## 2022-02-13 NOTE — Therapy (Incomplete)
OUTPATIENT PHYSICAL THERAPY TREATMENT NOTE   Patient Name: Melissa Erickson MRN: [id number] DOB:[birth year], 41 y.o., female Today's Date: 02/13/2022  PCP: Leamon Arnt, MD   REFERRING PROVIDER: Vanetta Mulders, MD    END OF SESSION:     Past Medical History:  Diagnosis Date   Allergy    Asthma    Chicken pox    Environmental allergies    Seasonal   Essential hypertension 07/18/2013   Gallstones    Heart attack ([place])    NSTEMI 07/2013, demand ischemia in setting of ARDS, post-gastric sleeve    History of congestive heart failure 2015   History of pneumonia    Leiomyoma of uterus, unspecified 07/26/2019   Migraine    Polycystic ovarian syndrome    Sleep apnea    Suppurative hidradenitis 07/21/2017   Ventral hernia    Past Surgical History:  Procedure Laterality Date   BREAST BIOPSY Right 2001   Normal   CESAREAN SECTION  2007   CHOLECYSTECTOMY     ESOPHAGOGASTRODUODENOSCOPY N/A 07/19/2013   Procedure: ESOPHAGOGASTRODUODENOSCOPY (EGD);  Surgeon: Harl Bowie, MD;  Location: [place];  Service: General;  Laterality: N/A;   HYSTERECTOMY ABDOMINAL WITH SALPINGECTOMY N/A 07/26/2019   Procedure: Hysterectomy Abdominal With Salpingectomy;  Surgeon: Sanjuana Kava, MD;  Location: [place];  Service: Gynecology;  Laterality: N/A;   LAPAROSCOPIC GASTRIC SLEEVE RESECTION     LAPAROSCOPY N/A 07/19/2013   Procedure: LAPAROSCOPY DIAGNOSTIC;  Surgeon: Harl Bowie, MD;  Location: [place];  Service: General;  Laterality: N/A;   TOTAL LAPAROSCOPIC HYSTERECTOMY WITH SALPINGECTOMY Bilateral 07/26/2019   Procedure: Attempted TOTAL LAPAROSCOPIC HYSTERECTOMY WITH SALPINGECTOMY;  Surgeon: Sanjuana Kava, MD;  Location: [place];  Service: Gynecology;  Laterality: Bilateral;   Patient Active Problem List   Diagnosis Date Noted   Iron deficiency 08/23/2021   Vitamin D deficiency 08/23/2021   GAD (generalized anxiety disorder) 06/19/2021   Seasonal allergic rhinitis due to pollen 06/21/2018   PCOS  (polycystic ovarian syndrome) 07/21/2017   Suppurative hidradenitis 07/21/2017   Gastric bypass status for obesity 07/21/2017   Spondylosis of lumbar region without myelopathy or radiculopathy 07/21/2017   Primary osteoarthritis of both hips 07/21/2017   History of obstructive sleep apnea 08/08/2013   NSTEMI (non-ST elevated myocardial infarction), type 2 secondary to demand ischemia sue to sepsis 07/20/2013   History of ARDS 24/23/5361   Diastolic dysfunction without heart failure 07/18/2013   Asthma, mild intermittent 03/11/2013   Migraine 06/14/2012   Morbid obesity with BMI of 50.0-59.9, adult ([place]) 06/14/2012    REFERRING DIAG: M54.50,G89.29 (ICD-10-CM) - Chronic right-sided low back pain without sciatica    THERAPY DIAG: Chronic right-sided low back pain without sciatica    Rationale for Evaluation and Treatment Rehabilitation  PERTINENT HISTORY: Melissa Erickson is a 41 y.o. female presents with multiple months and years of right lower back pain that is centered around the posterior obliques as well as latissimus muscle on the right side.  She is here today for further assessment as she went to her OB/GYN provider and was told that she may have a herniated disc.  Denies any pain radiating down the leg.  Has not had any physical therapy or injections for the leg although she has previously had dry needling on the left shoulder which helped her significantly.  She is here today for further assessment.  She does use a heating pad which helps somewhat   PRECAUTIONS: None   SUBJECTIVE:  SUBJECTIVE STATEMENT:  *** Pt presents to PT with continued reports of pain or discomfort. Notes HEP compliance. Ready to begin PT at this time.    PAIN:  Are you having pain? Yes: NPRS scale: ***6/10 Pain location: R low  back Pain description: sharp Aggravating factors: R SB Relieving factors: rest   OBJECTIVE: (objective measures completed at initial evaluation unless otherwise dated)   DIAGNOSTIC FINDINGS:  None found   PATIENT SURVEYS:  FOTO 42(60 predicted)   SCREENING FOR RED FLAGS: negative   COGNITION: Overall cognitive status: Within functional limits for tasks assessed                          SENSATION: Not tested   MUSCLE LENGTH: Hamstrings: Right 80 deg; Left 80 deg   POSTURE: rounded shoulders   PALPATION: Tender to R lumbosacral junction    LUMBAR ROM:    AROM eval  Flexion 75%  Extension 50%  Right lateral flexion 50% P!  Left lateral flexion 75%  Right rotation    Left rotation     (Blank rows = not tested)   LOWER EXTREMITY ROM:      Passive  Right eval Left eval  Hip flexion 110d 110d  Hip extension      Hip abduction      Hip adduction      Hip internal rotation      Hip external rotation      Knee flexion [facility] WFL  Knee extension [facility]  Ankle dorsiflexion      Ankle plantarflexion      Ankle inversion      Ankle eversion       (Blank rows = not tested)   LOWER EXTREMITY MMT:     MMT Right eval Left eval  Hip flexion 4+ 4+  Hip extension 4 4  Hip abduction 4 4  Hip adduction      Hip internal rotation      Hip external rotation      Knee flexion 4+ 4+  Knee extension 4 4  Ankle dorsiflexion      Ankle plantarflexion      Ankle inversion      Ankle eversion      core 3 3   (Blank rows = not tested)   LUMBAR SPECIAL TESTS:  Straight leg raise test: Negative, Slump test: Negative, SI Compression/distraction test: Negative, and FABER test: Positive   FUNCTIONAL TESTS:  5 times sit to stand: TBD based on pain   GAIT: Distance walked: 35f x2 Assistive device utilized: None Level of assistance: Complete Independence Comments: unremarkable   TODAY'S TREATMENT:       [facility] Adult PT Treatment:                                                 DATE: 02/14/2022 [address]atic therapy at [address] - therapeutic pool temp *** degrees Pt enters building ***  Treatment took place in water 3.8 to  4 ft 8 in.feet deep depending upon activity.  Pt entered and exited the pool via stair and handrails ***  Pt pain level *** at initiation of water walking. Patient entered water for aquatic therapy for first time and was introduced to principles and therapeutic effects of water as they ambulated and acclimated to pool.  Therapeutic Exercise: Walking forward/backwards/side stepping Lunge stepping forwards x2 laps Side stepping lunge x2 laps Runners stretch on bottom step x30" BIL Hamstring stretch on bottom step x30" BIL Figure 4 squat stretch, BIL UE support 2x30" BIL Standing thoracic rotation with hands in neutral for resistance x10 BIL STS from 3rd step from bottom At edge of pool, pt performed LE exercise: Hip abd/add x20 BIL Hip ext/flex with knee straight x 20 BIL Hip Circles CC/CCW 2x10 each BIL Marching hip flexion to knee extension 2x10 BIL Hamstring curl x20 BIL Squats 2x20 Step ups on submerged step 2x10 BIL Step up and overs on submerged step 2x10 BIL Sitting on bench in water: Bicycle kicks x1' Reverse bicycle kicks x1' Flutter kicks x1' Scissor kicks x1' Kickboard push/pull x1' Kickboard push downs x1'  Pt requires the buoyancy of water for active assisted exercises with buoyancy supported for strengthening and AROM exercises. Hydrostatic pressure also supports joints by unweighting joint load by at least 50 % in 3-4 feet depth water. 80% in chest to neck deep water. Water will provide assistance with movement using the current and laminar flow while the buoyancy reduces weight bearing. Pt requires the viscosity of the water for resistance with strengthening exercises.   [place] Adult PT Treatment:                                                DATE: 01/23/2022 Therapeutic Exercise: Nustep L5 x 5  min while taking subjective Bridge 2x10 PPT 3s x10 PPT with ball 2x10 3s Seated hamstring stretch 2x30" STS 2x10 Seated hamstring 2x10 GTB LAQ 2x10 4# Seated clamshell GTB  [facility] Adult PT Treatment:                                                DATE: 01/21/2022 Therapeutic Exercise: Nustep L2 8 min PPT 3s x10 Curlups L1 15x 90/90 30s x2 Seated hamstring stretch 30s x2 B Supine march w/PPT 30s x2 Supine hip fallout RTB 15x Bridge 15x Hip flexor stretch 30s x2 Clams RTB 15x B                                                                                                                        PATIENT EDUCATION:  Education details: Discussed eval findings, rehab rationale and POC and patient is in agreement  Person educated: Patient Education method: Explanation Education comprehension: verbalized understanding   HOME EXERCISE PROGRAM: Access Code: [email or web address] URL: [email or web address] Date: 01/14/2022 Prepared by: Sharlynn Oliphant   Exercises - Supine Posterior Pelvic Tilt  - 2 x daily - 5 x weekly - 2 sets - 10 reps - 3s hold - Supine 90/90 Abdominal Bracing  - 2 x daily - 5  x weekly - 2 sets - 2 reps - 30s hold - Curl Up with Arms Crossed  - 2 x daily - 5 x weekly - 2 sets - 10 reps   ASSESSMENT:   CLINICAL IMPRESSION:  ***  Pt was able to complete all prescribed exercises with no adverse effect. Therapy focused on improving core and proximal hip strength in order to decrease pain and improve comfort. She is progressing well with therapy, will continue to progress as able per POC.     OBJECTIVE IMPAIRMENTS: decreased activity tolerance, decreased knowledge of condition, decreased mobility, decreased ROM, decreased strength, impaired flexibility, obesity, and pain.    ACTIVITY LIMITATIONS: lifting, bending, sitting, standing, sleeping, and bed mobility   PERSONAL FACTORS: Fitness and Time since onset of injury/illness/exacerbation are also affecting  patient's functional outcome.      GOALS: Goals reviewed with patient? No   SHORT TERM GOALS: Target date: 01/31/22   Patient to demonstrate independence in HEP  Baseline:[id number] Goal status: INITIAL   2.  Assess 5x STS test and establish goal as needed. Baseline: UTA due to pain Goal status: INITIAL   3.  Increase core strength to 3+ as evidenced by ability to perform 15 curl ups Baseline: 7 Goal status: INITIAL     LONG TERM GOALS: Target date: 02/28/22     Increase FOTO score to 60  Baseline: 42 Goal status: INITIAL   2.  Increase core strength to 3+/5 Baseline: 3/5 Goal status: INITIAL   3.  Increase R SB ROM to 75% Baseline: 50% Goal status: INITIAL   4.  Decrease worst pain to 6/10 Baseline: 10/10 Goal status: INITIAL       PLAN:   PT FREQUENCY: 1-2x/week   PT DURATION: 6 weeks   PLANNED INTERVENTIONS: Therapeutic exercises, Therapeutic activity, Neuromuscular re-education, Balance training, Gait training, Patient/Family education, Self Care, Joint mobilization, Stair training, Aquatic Therapy, Dry Needling, Electrical stimulation, Manual therapy, and Re-evaluation.   PLAN FOR NEXT SESSION: HEP review and update, TPDN as appropriate, stretching, core strength and stabilization tasks, flexibility activities      Margarette Canada, PTA 02/13/2022, 10:19 AM

## 2022-02-14 ENCOUNTER — Telehealth: Payer: Self-pay

## 2022-02-14 ENCOUNTER — Ambulatory Visit: Payer: BC Managed Care – PPO

## 2022-02-14 NOTE — Telephone Encounter (Signed)
Attempted to call about missed appointment, was sent to VM but mailbox was full, unable to leave message. Patient has no further PT appointments scheduled at this time.  Margarette Canada, PTA 02/14/22 4:22 PM

## 2022-05-19 ENCOUNTER — Ambulatory Visit: Payer: BC Managed Care – PPO | Admitting: Family Medicine

## 2022-05-19 ENCOUNTER — Encounter: Payer: Self-pay | Admitting: Family Medicine

## 2022-05-19 VITALS — BP 138/86 | HR 85 | Temp 97.8°F | Ht 64.0 in | Wt 360.2 lb

## 2022-05-19 DIAGNOSIS — Z6841 Body Mass Index (BMI) 40.0 and over, adult: Secondary | ICD-10-CM | POA: Diagnosis not present

## 2022-05-19 MED ORDER — WEGOVY 0.5 MG/0.5ML ~~LOC~~ SOAJ: 0.5000 mg | SUBCUTANEOUS | 2 refills | Status: DC

## 2022-05-19 NOTE — Progress Notes (Signed)
Subjective  CC:  Chief Complaint  Patient presents with   Morbid obesity    HPI: Melissa Erickson is a 42 y.o. female who presents to the office today to address the problems listed above in the chief complaint. Obesity: failed phentermine in October. Started wegovy 2 days ago: could not get prescription filled for months due to shortage. Tolerating well so far; no nausea and mild appetite suppression.    Wt Readings from Last 3 Encounters:  05/19/22 (!) 360 lb 3.2 oz (163.4 kg)  11/25/21 (!) 348 lb (157.9 kg)  08/23/21 (!) 349 lb 3.2 oz (158.4 kg)    Assessment  1. Morbid obesity with BMI of 50.0-59.9, adult      Plan  Morbid obesity:  now on wegovy 2.5; will increase to 5mg  next month. Recheck 3 months.   Follow up: 3 mo for cpe  Visit date not found  No orders of the defined types were placed in this encounter.  No orders of the defined types were placed in this encounter.     I reviewed the patients updated PMH, FH, and SocHx.    Patient Active Problem List   Diagnosis Date Noted   Gastric bypass status for obesity 07/21/2017    Priority: High   History of obstructive sleep apnea 08/08/2013    Priority: High   Morbid obesity with BMI of 50.0-59.9, adult 06/14/2012    Priority: High   GAD (generalized anxiety disorder) 06/19/2021    Priority: Medium    PCOS (polycystic ovarian syndrome) 07/21/2017    Priority: Medium    Suppurative hidradenitis 07/21/2017    Priority: Medium    Spondylosis of lumbar region without myelopathy or radiculopathy 07/21/2017    Priority: Medium    Primary osteoarthritis of both hips 07/21/2017    Priority: Medium    NSTEMI (non-ST elevated myocardial infarction), type 2 secondary to demand ischemia sue to sepsis 07/20/2013    Priority: Medium    History of ARDS 07/18/2013    Priority: Medium    Diastolic dysfunction without heart failure 07/18/2013    Priority: Medium    Asthma, mild intermittent 03/11/2013    Priority:  Medium    Migraine 06/14/2012    Priority: Medium    Iron deficiency 08/23/2021    Priority: Low   Vitamin D deficiency 08/23/2021    Priority: Low   Seasonal allergic rhinitis due to pollen 06/21/2018    Priority: Low   Current Meds  Medication Sig   albuterol (VENTOLIN HFA) 108 (90 Base) MCG/ACT inhaler Inhale 2 puffs into the lungs every 4 (four) hours as needed for wheezing or shortness of breath.   ALPRAZolam (XANAX) 0.5 MG tablet Take 0.5 mg by mouth 3 (three) times daily as needed.   Multiple Vitamins-Minerals (MULTIVITAMIN WITH MINERALS) tablet Take 1 tablet by mouth daily.   sertraline (ZOLOFT) 100 MG tablet Take 1 tablet (100 mg total) by mouth daily.   SUMAtriptan (IMITREX) 25 MG tablet Take 1 tablet (25 mg total) by mouth once as needed for up to 1 dose for migraine. May repeat in 2 hours if headache persists or recurs.   WEGOVY 0.25 MG/0.5ML SOAJ Inject 0.25 mg into the skin once a week.    Allergies: Patient has No Known Allergies. Family History: Patient family history includes Alcohol abuse in her father; Allergic rhinitis in her daughter; Alzheimer's disease in her maternal grandmother; COPD in her maternal grandfather; Cancer in her maternal grandfather, paternal grandfather, and paternal grandmother;  Cerebral aneurysm in her paternal grandmother; Diabetes in her maternal grandfather and mother; Early death in her father and paternal grandmother; Healthy in her brother; Heart attack in her maternal grandmother; Miscarriages / [place] in her mother; Sarcoidosis in her father; Stroke in her mother. Social History:  Patient  reports that she has never smoked. She has never used smokeless tobacco. She reports current alcohol use. She reports that she does not use drugs.  Review of Systems: Constitutional: Negative for fever malaise or anorexia Cardiovascular: negative for chest pain Respiratory: negative for SOB or persistent cough Gastrointestinal: negative for  abdominal pain  Objective  Vitals: Temp 97.8 F (36.6 C)   Ht 5\' 4"  (1.626 m)   Wt (!) 360 lb 3.2 oz (163.4 kg)   LMP 07/22/2019   BMI 61.83 kg/m  General: no acute distress , A&Ox3   Commons side effects, risks, benefits, and alternatives for medications and treatment plan prescribed today were discussed, and the patient expressed understanding of the given instructions. Patient is instructed to call or message via MyChart if he/she has any questions or concerns regarding our treatment plan. No barriers to understanding were identified. We discussed Red Flag symptoms and signs in detail. Patient expressed understanding regarding what to do in case of urgent or emergency type symptoms.  Medication list was reconciled, printed and provided to the patient in AVS. Patient instructions and summary information was reviewed with the patient as documented in the AVS. This note was prepared with assistance of Dragon voice recognition software. Occasional wrong-word or sound-a-like substitutions may have occurred due to the inherent limitations of voice recognition software

## 2022-05-19 NOTE — Patient Instructions (Signed)
Please return in 3 months for your complete physical.   If you have any questions or concerns, please don't hesitate to send me a message via MyChart or call the office at [phone]. Thank you for visiting with [place] today! It's our pleasure caring for you.

## 2022-05-30 ENCOUNTER — Encounter: Payer: Self-pay | Admitting: Family Medicine

## 2022-05-30 ENCOUNTER — Ambulatory Visit
Admission: RE | Admit: 2022-05-30 | Discharge: 2022-05-30 | Disposition: A | Discharge status: Home / Self Care | Payer: BC Managed Care – PPO | Source: Ambulatory Visit | Attending: Urgent Care | Admitting: Urgent Care

## 2022-05-30 VITALS — BP 138/86 | HR 80 | Temp 98.0°F | Resp 20

## 2022-05-30 DIAGNOSIS — M545 Low back pain, unspecified: Secondary | ICD-10-CM

## 2022-05-30 MED ORDER — PREDNISONE 10 MG (21) PO TBPK: ORAL_TABLET | Freq: Every day | ORAL | 0 refills | Status: DC

## 2022-05-30 MED ORDER — CYCLOBENZAPRINE HCL 10 MG PO TABS: 10.0000 mg | ORAL_TABLET | Freq: Two times a day (BID) | ORAL | 0 refills | Status: DC | PRN

## 2022-05-30 NOTE — ED Provider Notes (Signed)
Renaldo Fiddler    CSN: [id number] Arrival date & time: 05/30/22  1839      History   Chief Complaint Chief Complaint  Patient presents with   Back Pain    Fall on yesterday - Entered by patient    HPI Melissa Erickson is a 42 y.o. female.    Back Pain   Presents to UC with c/o back pain following a fall. Landed on her rear when she tried to sit and "missed the chair". She took ibuprofen 1600 mg at 1700.  PMH includes gastric bypass.  She endorses R leg neuropathy at baseline.  Past Medical History:  Diagnosis Date   Allergy    Asthma    Chicken pox    Environmental allergies    Seasonal   Essential hypertension 07/18/2013   Gallstones    Heart attack    NSTEMI 07/2013, demand ischemia in setting of ARDS, post-gastric sleeve    History of congestive heart failure 2015   History of pneumonia    Leiomyoma of uterus, unspecified 07/26/2019   Migraine    Polycystic ovarian syndrome    Sleep apnea    Suppurative hidradenitis 07/21/2017   Ventral hernia     Patient Active Problem List   Diagnosis Date Noted   Iron deficiency 08/23/2021   Vitamin D deficiency 08/23/2021   GAD (generalized anxiety disorder) 06/19/2021   Seasonal allergic rhinitis due to pollen 06/21/2018   PCOS (polycystic ovarian syndrome) 07/21/2017   Suppurative hidradenitis 07/21/2017   Gastric bypass status for obesity 07/21/2017   Spondylosis of lumbar region without myelopathy or radiculopathy 07/21/2017   Primary osteoarthritis of both hips 07/21/2017   History of obstructive sleep apnea 08/08/2013   NSTEMI (non-ST elevated myocardial infarction), type 2 secondary to demand ischemia sue to sepsis 07/20/2013   History of ARDS 07/18/2013   Diastolic dysfunction without heart failure 07/18/2013   Asthma, mild intermittent 03/11/2013   Migraine 06/14/2012   Morbid obesity with BMI of 50.0-59.9, adult 06/14/2012    Past Surgical History:  Procedure Laterality Date   BREAST BIOPSY Right  2001   Normal   CESAREAN SECTION  2007   CHOLECYSTECTOMY     ESOPHAGOGASTRODUODENOSCOPY N/A 07/19/2013   Procedure: ESOPHAGOGASTRODUODENOSCOPY (EGD);  Surgeon: Shelly Rubenstein, MD;  Location: [facility] OR;  Service: General;  Laterality: N/A;   HYSTERECTOMY ABDOMINAL WITH SALPINGECTOMY N/A 07/26/2019   Procedure: Hysterectomy Abdominal With Salpingectomy;  Surgeon: Essie Hart, MD;  Location: [facility];  Service: Gynecology;  Laterality: N/A;   LAPAROSCOPIC GASTRIC SLEEVE RESECTION     LAPAROSCOPY N/A 07/19/2013   Procedure: LAPAROSCOPY DIAGNOSTIC;  Surgeon: Shelly Rubenstein, MD;  Location: [facility] OR;  Service: General;  Laterality: N/A;   TOTAL LAPAROSCOPIC HYSTERECTOMY WITH SALPINGECTOMY Bilateral 07/26/2019   Procedure: Attempted TOTAL LAPAROSCOPIC HYSTERECTOMY WITH SALPINGECTOMY;  Surgeon: Essie Hart, MD;  Location: [facility] OR;  Service: Gynecology;  Laterality: Bilateral;    OB History   No obstetric history on file.      Home Medications    Prior to Admission medications   Medication Sig Start Date End Date Taking? Authorizing Provider  albuterol (VENTOLIN HFA) 108 (90 Base) MCG/ACT inhaler Inhale 2 puffs into the lungs every 4 (four) hours as needed for wheezing or shortness of breath. 02/23/19   Willow Ora, MD  ALPRAZolam Prudy Feeler) 0.5 MG tablet Take 0.5 mg by mouth 3 (three) times daily as needed. 06/01/19   [provider]  Multiple Vitamins-Minerals (MULTIVITAMIN WITH MINERALS) tablet  Take 1 tablet by mouth daily.    [provider]  sertraline (ZOLOFT) 100 MG tablet Take 1 tablet (100 mg total) by mouth daily. 06/19/21   Willow Ora, MD  SUMAtriptan (IMITREX) 25 MG tablet Take 1 tablet (25 mg total) by mouth once as needed for up to 1 dose for migraine. May repeat in 2 hours if headache persists or recurs. 02/23/19   Willow Ora, MD  WEGOVY 0.5 MG/0.5ML SOAJ Inject 0.5 mg into the skin once a week. 05/19/22   Willow Ora, MD    Family History Family History  Problem  Relation Age of Onset   Diabetes Mother    Stroke Mother    Miscarriages / [place] Mother    Sarcoidosis Father    Alcohol abuse Father    Early death Father    Healthy Brother    Alzheimer's disease Maternal Grandmother    Heart attack Maternal Grandmother    Diabetes Maternal Grandfather    COPD Maternal Grandfather    Cancer Maternal Grandfather    Cancer Paternal Grandmother    Cerebral aneurysm Paternal Grandmother    Early death Paternal Grandmother    Cancer Paternal Grandfather    Allergic rhinitis Daughter     Social History Social History   Tobacco Use   Smoking status: Never   Smokeless tobacco: Never  Vaping Use   Vaping Use: Never used  Substance Use Topics   Alcohol use: Yes    Comment: occ   Drug use: Never     Allergies   Patient has no known allergies.   Review of Systems Review of Systems  Musculoskeletal:  Positive for back pain.     Physical Exam Triage Vital Signs ED Triage Vitals  Enc Vitals Group     BP      Pulse      Resp      Temp      Temp src      SpO2      Weight      Height      Head Circumference      Peak Flow      Pain Score      Pain Loc      Pain Edu?      Excl. in GC?    No data found.  Updated Vital Signs LMP 07/22/2019   Visual Acuity Right Eye Distance:   Left Eye Distance:   Bilateral Distance:    Right Eye Near:   Left Eye Near:    Bilateral Near:     Physical Exam Vitals reviewed.  Constitutional:      Appearance: Normal appearance.  Musculoskeletal:     Lumbar back: Signs of trauma, spasms and tenderness present. Decreased range of motion.  Skin:    General: Skin is warm and dry.  Neurological:     General: No focal deficit present.     Mental Status: She is alert and oriented to person, place, and time.  Psychiatric:        Mood and Affect: Mood normal.        Behavior: Behavior normal.      UC Treatments / Results  Labs (all labs ordered are listed, but only abnormal  results are displayed) Labs Reviewed - No data to display  EKG   Radiology No results found.  Procedures Procedures (including critical care time)  Medications Ordered in UC Medications - No data to display  Initial Impression / Assessment  and Plan / UC Course  I have reviewed the triage vital signs and the nursing notes.  Pertinent labs & imaging results that were available during my care of the patient were reviewed by me and considered in my medical decision making (see chart for details).   Sided lower back pain, possibly muscle spasms following a fall from a chair.  Prescribing cyclobenzaprine as a relaxant to assist with sleep.  Cautioned patient on using ibuprofen or other NSAIDs given her history of gastric bypass, and certainly to limit doses if she does decide to take it.  Will prescribe a course of prednisone to reduce inflammation and asked her to use famotidine 30 minutes prior to taking the prednisone dose.  Counseled patient on potential for adverse effects with medications prescribed/recommended today, ER and return-to-clinic precautions discussed, patient verbalized understanding and agreement with care plan.   Final Clinical Impressions(s) / UC Diagnoses   Final diagnoses:  None   Discharge Instructions   None    ED Prescriptions   None    PDMP not reviewed this encounter.   Charma Igo, [place] 05/30/22 [phone]

## 2022-05-30 NOTE — ED Triage Notes (Signed)
Patient presents to UC for back pain from a fall yesterday. Trying to sit down in her chair she missed the chair and fell over. Took 1600 mg ibuprofen at 1700.

## 2022-05-30 NOTE — Discharge Instructions (Addendum)
Take cyclobenzaprine at bedtime. This medication will make cause sedation.  Recommend you obtain famotidine (an OTC anti-acid medication) and take 30 minutes prior to taking prednisone each morning. This will help protect your stomach from a possible side effect of over-production of acid.

## 2022-06-02 ENCOUNTER — Encounter: Payer: Self-pay | Admitting: *Deleted

## 2022-08-26 DIAGNOSIS — Z0289 Encounter for other administrative examinations: Secondary | ICD-10-CM

## 2022-08-27 ENCOUNTER — Encounter (INDEPENDENT_AMBULATORY_CARE_PROVIDER_SITE_OTHER): Payer: BC Managed Care – PPO | Admitting: Family Medicine

## 2022-09-09 ENCOUNTER — Ambulatory Visit (INDEPENDENT_AMBULATORY_CARE_PROVIDER_SITE_OTHER): Payer: BC Managed Care – PPO | Admitting: Internal Medicine

## 2022-09-10 ENCOUNTER — Ambulatory Visit (INDEPENDENT_AMBULATORY_CARE_PROVIDER_SITE_OTHER): Payer: BC Managed Care – PPO | Admitting: Internal Medicine

## 2022-09-10 ENCOUNTER — Encounter (INDEPENDENT_AMBULATORY_CARE_PROVIDER_SITE_OTHER): Payer: Self-pay | Admitting: Internal Medicine

## 2022-09-10 VITALS — BP 135/83 | HR 72 | Temp 97.9°F | Ht 65.0 in | Wt 361.0 lb

## 2022-09-10 DIAGNOSIS — I4510 Unspecified right bundle-branch block: Secondary | ICD-10-CM

## 2022-09-10 DIAGNOSIS — G4733 Obstructive sleep apnea (adult) (pediatric): Secondary | ICD-10-CM | POA: Diagnosis not present

## 2022-09-10 DIAGNOSIS — Z9884 Bariatric surgery status: Secondary | ICD-10-CM

## 2022-09-10 DIAGNOSIS — R5383 Other fatigue: Secondary | ICD-10-CM

## 2022-09-10 DIAGNOSIS — R7303 Prediabetes: Secondary | ICD-10-CM

## 2022-09-10 DIAGNOSIS — F32A Depression, unspecified: Secondary | ICD-10-CM

## 2022-09-10 DIAGNOSIS — Z1331 Encounter for screening for depression: Secondary | ICD-10-CM

## 2022-09-10 DIAGNOSIS — R948 Abnormal results of function studies of other organs and systems: Secondary | ICD-10-CM

## 2022-09-10 DIAGNOSIS — Z723 Lack of physical exercise: Secondary | ICD-10-CM | POA: Insufficient documentation

## 2022-09-10 DIAGNOSIS — R0602 Shortness of breath: Secondary | ICD-10-CM | POA: Diagnosis not present

## 2022-09-10 DIAGNOSIS — E559 Vitamin D deficiency, unspecified: Secondary | ICD-10-CM

## 2022-09-10 DIAGNOSIS — Z6841 Body Mass Index (BMI) 40.0 and over, adult: Secondary | ICD-10-CM

## 2022-09-10 NOTE — Assessment & Plan Note (Signed)
Most recent vitamin D levels  Lab Results  Component Value Date   VD25OH 18.21 (L) 08/23/2021   VD25OH 14.09 (L) 06/19/2021   VD25OH 14.87 (L) 02/23/2019     Deficiency state associated with adiposity and may result in leptin resistance, weight gain and fatigue.   Plan: Check vitamin D levels today

## 2022-09-10 NOTE — Assessment & Plan Note (Signed)
Found incidentally on EKG.  She is asymptomatic.  She had a non-STEMI secondary to sepsis postoperatively in 2015.  So this could be ischemic, structural or degenerative.  We will order a contrast-enhanced echocardiogram.

## 2022-09-10 NOTE — Assessment & Plan Note (Addendum)
Gastric Sleeve 2015, [name]. Complicated with postoperative pneumonia Presurgical weight 426, nadir 310 at 12 months with gradual weight regain starting 2017.  Patient likely experiencing weight regain secondary to metabolic and behavioral maladaptations.  Was referred to our clinic for assistance with pharmacological management.  She has an abnormal metabolism and untreated OSA.  She is also on obesogenic medication

## 2022-09-10 NOTE — Assessment & Plan Note (Signed)
Patient has a slower than predicted metabolism. IC 1166 vs. calculated 2206.This may contribute to weight gain, chronic fatigue and difficulty losing weight. Likely secondary to metabolic adaptations following gastric bypass, loss of muscle mass, untreated sleep apnea and chronic skipping of meals with inadequate protein intake.  We reviewed measures to improve metabolism including progressive strengthening exercises, increasing protein intake at every meal and maintaining adequate hydration and sleep.

## 2022-09-10 NOTE — Assessment & Plan Note (Signed)
We discussed the risk associated with low volume of physical activity.  Advised to think about ways to increase physical activity throughout the day and also engaging in exercise.  She feels limited due to back and knee problems we discussed other low impact ways to get exercise.

## 2022-09-10 NOTE — Assessment & Plan Note (Signed)
History of gastric bypass, has lost lower than predicted metabolism, chronic skipping of meals, high processed sugar intake and low levels of physical activity.  She also has untreated sleep apnea and is on obesogenic medication.  She has tried incretin therapy on a few occasions but trials have been limited due to lapse in coverage and cost.  We need to first work on nutritional, behavioral strategies, increased physical activity levels as these will help improve her metabolism.  I might obtain an upper GI series to take a look at her stomach remnant, to make sure there are no complications that may result in weight gain.

## 2022-09-10 NOTE — Assessment & Plan Note (Signed)
Most recent A1c is  Lab Results  Component Value Date   HGBA1C 5.8 08/23/2021   HGBA1C 5.7 02/23/2019    Patient aware of disease state and risk of progression. This may contribute to abnormal cravings, fatigue and diabetic complications without having diabetes.   We reviewed treatment options which includes losing 7 to 10% of body weight, increasing physical activity to a goal of 150 minutes a week at moderate intensity.  She may also be a candidate for pharmacoprophylaxis with metformin or incretin mimetic.

## 2022-09-10 NOTE — Progress Notes (Signed)
Chief Complaint:   Melissa Erickson (MR# [id number]) is a 42 y.o. female who presents for evaluation and treatment of Melissa and related comorbidities. Current BMI is Body mass index is 60.07 kg/m. [name] has been struggling with her weight for many years and has been unsuccessful in either losing weight, maintaining weight loss, or reaching her healthy weight goal.  Melissa Erickson is currently in the action stage of change and ready to dedicate time achieving and maintaining a healthier weight. Melissa Erickson is interested in becoming our patient and working on intensive lifestyle modifications including (but not limited to) diet and exercise for weight loss.  Melissa Erickson's habits were reviewed today and are as follows: Her family eats meals together, she thinks her family will eat healthier with her, her desired weight loss is 111 lbs, she has been heavy most of her life, she started gaining weight at age 50, her heaviest weight ever was 426 pounds, she is a picky eater and doesn't like to eat healthier foods, she has significant food cravings issues, she snacks frequently in the evenings, she skips meals frequently, she is frequently drinking liquids with calories, she frequently makes poor food choices, and she struggles with emotional eating.  Depression Screen Melissa Erickson's Food and Mood (modified PHQ-9) score was 17.  Subjective:   1. Other fatigue Melissa Erickson admits to daytime somnolence and admits to waking up still tired. Patient has a history of symptoms of daytime fatigue and morning fatigue. Melissa Erickson generally gets 5 or 6 hours of sleep per night, and states that she has nightime awakenings. Snoring is present. Apneic episodes are present. Epworth Sleepiness Score is 17.   2. SOB (shortness of breath) on exertion Melissa Erickson notes increasing shortness of breath with exercising and seems to be worsening over time with weight gain. She notes getting out of breath sooner with activity than she used to. This has not gotten  worse recently. Melissa Erickson denies shortness of breath at rest or orthopnea.  3. Gastric bypass status for Melissa Gastric Sleeve 2015, [name]. Complicated with postoperative pneumonia Presurgical weight 426, nadir 310 at 12 months with gradual weight regain starting 2017.  4. OSA (obstructive sleep apnea) Sleep study 2013, stopped PAP after weight loss never retested.  Has an elevated Epworth therefore I suspect still has sleep disordered breathing.  5. Prediabetes Patient aware of disease state and risk of progression. This may contribute to abnormal cravings, fatigue and diabetic complications without having diabetes.   Most recent A1c is  Lab Results  Component Value Date   HGBA1C 5.8 08/23/2021   HGBA1C 5.7 02/23/2019   6. Vitamin D deficiency Deficiency state associated with adiposity and may result in leptin resistance, weight gain and fatigue.   Most recent vitamin D levels  Lab Results  Component Value Date   VD25OH 18.21 (L) 08/23/2021   VD25OH 14.09 (L) 06/19/2021   VD25OH 14.87 (L) 02/23/2019   7. RBBB (right bundle branch block) Found incidentally on EKG.  She is asymptomatic.  She had a non-STEMI secondary to sepsis postoperatively in 2015.  So this could be ischemic, structural or degenerative.   8. Abnormal metabolism Patient has a slower than predicted metabolism. IC 1166 vs. calculated 2206.This may contribute to weight gain, chronic fatigue and difficulty losing weight. Likely secondary to metabolic adaptations following gastric bypass, loss of muscle mass, untreated sleep apnea and chronic skipping of meals with inadequate protein intake.   9. Physically inactive She feels limited due to back and  knee problems we discussed other low impact ways to get exercise.  Assessment/Plan:   1. Other fatigue Melissa Erickson does feel that her weight is causing her energy to be lower than it should be. Fatigue may be related to Melissa, depression or many other causes. Labs  will be ordered, and in the meanwhile, Melissa Erickson will focus on self care including making healthy food choices, increasing physical activity and focusing on stress reduction.  - EKG 12-Lead - Vitamin B12 - CBC with Differential/Platelet  2. SOB (shortness of breath) on exertion Melissa Erickson does feel that she gets out of breath more easily that she used to when she exercises. Melissa Erickson's shortness of breath appears to be Melissa related and exercise induced. She has agreed to work on weight loss and gradually increase exercise to treat her exercise induced shortness of breath. Will continue to monitor closely.  3. Gastric bypass status for Melissa Patient likely experiencing weight regain secondary to metabolic and behavioral maladaptations.  Was referred to our clinic for assistance with pharmacological management.  She has an abnormal metabolism and untreated OSA.  She is also on obesogenic medication.  4. OSA (obstructive sleep apnea) I recommend that she talk to her PCP about getting a referral for a sleep study.  We discussed the risk associated with untreated sleep apnea and its effect on weight.  Losing 15% of body weight may improve condition.  5. Prediabetes We reviewed treatment options which includes losing 7 to 10% of body weight, increasing physical activity to a goal of 150 minutes a week at moderate intensity. She may also be a candidate for pharmacoprophylaxis with metformin or incretin mimetic.   - Comprehensive metabolic panel - Hemoglobin A1c - Insulin, random - Lipid Panel With LDL/HDL Ratio  6. Vitamin D deficiency Check vitamin D levels today.  - VITAMIN D 25 Hydroxy (Vit-D Deficiency, Fractures)  7. RBBB (right bundle branch block) We will order a contrast-enhanced echocardiogram.  - ECHOCARDIOGRAM COMPLETE; Future  8. Abnormal metabolism We reviewed measures to improve metabolism including progressive strengthening exercises, increasing protein intake at every meal and  maintaining adequate hydration and sleep.   9. Physically inactive We discussed the risk associated with low volume of physical activity.  Advised to think about ways to increase physical activity throughout the day and also engaging in exercise.   10. Depression screen Mio had a positive depression screening. Depression is commonly associated with Melissa and often results in emotional eating behaviors. We will monitor this closely and work on CBT to help improve the non-hunger eating patterns. Referral to Psychology may be required if no improvement is seen as she continues in our clinic.  11. Morbid Melissa with BMI of 50.0-59.9, adult (HCC) History of gastric bypass, has lost lower than predicted metabolism, chronic skipping of meals, high processed sugar intake and low levels of physical activity.  She also has untreated sleep apnea and is on obesogenic medication.  She has tried incretin therapy on a few occasions but trials have been limited due to lapse in coverage and cost.  We need to first work on nutritional, behavioral strategies, increased physical activity levels as these will help improve her metabolism.  I might obtain an upper GI series to take a look at her stomach remnant, to make sure there are no complications that may result in weight gain.  - TSH  Danyah is currently in the action stage of change and her goal is to continue with weight loss efforts. I recommend Derry begin  the structured treatment plan as follows:  She has agreed to the Category 2 Plan.  Isocaloric.   Exercise goals: All adults should avoid inactivity. Some physical activity is better than none, and adults who participate in any amount of physical activity gain some health benefits.   Behavioral modification strategies: increasing lean protein intake, decreasing simple carbohydrates, increasing vegetables, increasing water intake, decreasing liquid calories, increasing high fiber foods, decreasing eating  out, no skipping meals, meal planning and cooking strategies, keeping healthy foods in the home, better snacking choices, planning for success, and decreasing junk food.  She was informed of the importance of frequent follow-up visits to maximize her success with intensive lifestyle modifications for her multiple health conditions. She was informed we would discuss her lab results at her next visit unless there is a critical issue that needs to be addressed sooner. Deirdre agreed to keep her next visit at the agreed upon time to discuss these results.  Objective:   Blood pressure 135/83, pulse 72, temperature 97.9 F (36.6 C), height 5\' 5"  (1.651 m), weight (!) 361 lb (163.7 kg), last menstrual period 07/22/2019, SpO2 97%. Body mass index is 60.07 kg/m.  EKG: Normal sinus rhythm, rate 71 BPM.  Indirect Calorimeter completed today shows a VO2 of 169 and a REE of 1166.  Her calculated basal metabolic rate is 4098 thus her basal metabolic rate is worse than expected.  General: Cooperative, alert, well developed, in no acute distress. HEENT: Conjunctivae and lids unremarkable. Cardiovascular: Regular rhythm.  Lungs: Normal work of breathing. Neurologic: No focal deficits.   Lab Results  Component Value Date   CREATININE 0.75 10/27/2021   BUN 9 10/27/2021   NA 138 10/27/2021   K 3.5 10/27/2021   CL 105 10/27/2021   CO2 25 10/27/2021   Lab Results  Component Value Date   ALT 11 10/27/2021   AST 10 (L) 10/27/2021   ALKPHOS 53 10/27/2021   BILITOT 0.5 10/27/2021   Lab Results  Component Value Date   HGBA1C 5.8 08/23/2021   HGBA1C 5.7 02/23/2019   No results found for: "INSULIN" Lab Results  Component Value Date   TSH 1.07 06/19/2021   Lab Results  Component Value Date   CHOL 208 (H) 06/19/2021   HDL 40.10 06/19/2021   LDLCALC 147 (H) 06/19/2021   TRIG 102.0 06/19/2021   CHOLHDL 5 06/19/2021   Lab Results  Component Value Date   WBC 7.1 10/27/2021   HGB 12.7 10/27/2021    HCT 40.1 10/27/2021   MCV 85.7 10/27/2021   PLT 196 10/27/2021   Lab Results  Component Value Date   IRON 62 08/23/2021   TIBC 334 08/23/2021   FERRITIN 16 08/23/2021   Attestation Statements:   Reviewed by clinician on day of visit: allergies, medications, problem list, medical history, surgical history, family history, social history, and previous encounter notes.  I have spent 60 minutes in the care of the patient today including: preparing to see patient (e.g. review and interpretation of tests, old notes ), obtaining and/or reviewing separately obtained history, counseling and educating the patient, ordering medications, test and procedures, documenting clinical information in the electronic or other health care record, and independently interpreting results and communicating results to the patient,family, or caregiver.  Trude Mcburney, am acting as transcriptionist for Worthy Rancher, MD.  I have reviewed the above documentation for accuracy and completeness, and I agree with the above. -Worthy Rancher, MD

## 2022-09-10 NOTE — Assessment & Plan Note (Addendum)
Sleep study 2013, stopped PAP after weight loss never retested.  Has an elevated Epworth therefore I suspect still has sleep disordered breathing.  I recommend that she talk to her PCP about getting a referral for a sleep study.  We discussed the risk associated with untreated sleep apnea and its effect on weight.  Losing 15% of body weight may improve condition.

## 2022-09-11 LAB — HEMOGLOBIN A1C: Est. average glucose Bld gHb Est-mCnc: 126 mg/dL

## 2022-09-11 LAB — INSULIN, RANDOM: INSULIN: 27.6 u[IU]/mL — ABNORMAL HIGH (ref 2.6–24.9)

## 2022-09-11 LAB — CBC WITH DIFFERENTIAL/PLATELET
Basophils Absolute: 0 10*3/uL (ref 0.0–0.2)
Basos: 1 %
EOS (ABSOLUTE): 0.1 10*3/uL (ref 0.0–0.4)
Eos: 2 %
Hematocrit: 41.9 % (ref 34.0–46.6)
Hemoglobin: 13.3 g/dL (ref 11.1–15.9)
Immature Grans (Abs): 0 10*3/uL (ref 0.0–0.1)
Lymphs: 32 %
MCH: 26.7 pg (ref 26.6–33.0)
MCHC: 31.7 g/dL (ref 31.5–35.7)
MCV: 84 fL (ref 79–97)
Monocytes Absolute: 0.3 10*3/uL (ref 0.1–0.9)
Neutrophils Absolute: 3.4 10*3/uL (ref 1.4–7.0)
RBC: 4.99 x10E6/uL (ref 3.77–5.28)
RDW: 13.7 % (ref 11.7–15.4)
WBC: 5.7 10*3/uL (ref 3.4–10.8)

## 2022-09-11 LAB — COMPREHENSIVE METABOLIC PANEL
AST: 10 IU/L (ref 0–40)
Albumin: 3.7 g/dL — ABNORMAL LOW (ref 3.9–4.9)
Alkaline Phosphatase: 77 IU/L (ref 44–121)
BUN/Creatinine Ratio: 16 (ref 9–23)
BUN: 11 mg/dL (ref 6–24)
CO2: 26 mmol/L (ref 20–29)
Calcium: 9.2 mg/dL (ref 8.7–10.2)
Chloride: 104 mmol/L (ref 96–106)
Creatinine, Ser: 0.69 mg/dL (ref 0.57–1.00)
Globulin, Total: 3 g/dL (ref 1.5–4.5)
Glucose: 95 mg/dL (ref 70–99)
Potassium: 4.4 mmol/L (ref 3.5–5.2)
Sodium: 141 mmol/L (ref 134–144)
Total Protein: 6.7 g/dL (ref 6.0–8.5)

## 2022-09-11 LAB — TSH: TSH: 1.68 u[IU]/mL (ref 0.450–4.500)

## 2022-09-11 LAB — LIPID PANEL WITH LDL/HDL RATIO
Cholesterol, Total: 209 mg/dL — ABNORMAL HIGH (ref 100–199)
LDL Chol Calc (NIH): 143 mg/dL — ABNORMAL HIGH (ref 0–99)
LDL/HDL Ratio: 3.3 ratio — ABNORMAL HIGH (ref 0.0–3.2)
VLDL Cholesterol Cal: 22 mg/dL (ref 5–40)

## 2022-09-11 LAB — VITAMIN B12: Vitamin B-12: 828 pg/mL (ref 232–1245)

## 2022-09-11 LAB — VITAMIN D 25 HYDROXY (VIT D DEFICIENCY, FRACTURES): Vit D, 25-Hydroxy: 9 ng/mL — ABNORMAL LOW (ref 30.0–100.0)

## 2022-09-29 ENCOUNTER — Other Ambulatory Visit: Payer: Self-pay | Admitting: Family Medicine

## 2022-09-29 ENCOUNTER — Ambulatory Visit (INDEPENDENT_AMBULATORY_CARE_PROVIDER_SITE_OTHER): Payer: BC Managed Care – PPO | Admitting: Internal Medicine

## 2022-09-29 ENCOUNTER — Encounter (INDEPENDENT_AMBULATORY_CARE_PROVIDER_SITE_OTHER): Payer: Self-pay | Admitting: Internal Medicine

## 2022-09-29 ENCOUNTER — Encounter: Payer: Self-pay | Admitting: Family Medicine

## 2022-09-29 ENCOUNTER — Other Ambulatory Visit (INDEPENDENT_AMBULATORY_CARE_PROVIDER_SITE_OTHER): Payer: Self-pay | Admitting: Internal Medicine

## 2022-09-29 VITALS — BP 128/76 | HR 83 | Temp 98.1°F | Ht 65.0 in | Wt 366.0 lb

## 2022-09-29 DIAGNOSIS — R948 Abnormal results of function studies of other organs and systems: Secondary | ICD-10-CM | POA: Diagnosis not present

## 2022-09-29 DIAGNOSIS — E669 Obesity, unspecified: Secondary | ICD-10-CM

## 2022-09-29 DIAGNOSIS — G4733 Obstructive sleep apnea (adult) (pediatric): Secondary | ICD-10-CM

## 2022-09-29 DIAGNOSIS — E7800 Pure hypercholesterolemia, unspecified: Secondary | ICD-10-CM

## 2022-09-29 DIAGNOSIS — R7303 Prediabetes: Secondary | ICD-10-CM

## 2022-09-29 DIAGNOSIS — I4510 Unspecified right bundle-branch block: Secondary | ICD-10-CM | POA: Diagnosis not present

## 2022-09-29 DIAGNOSIS — E559 Vitamin D deficiency, unspecified: Secondary | ICD-10-CM

## 2022-09-29 DIAGNOSIS — Z6841 Body Mass Index (BMI) 40.0 and over, adult: Secondary | ICD-10-CM

## 2022-09-29 DIAGNOSIS — Z9884 Bariatric surgery status: Secondary | ICD-10-CM

## 2022-09-29 MED ORDER — METFORMIN HCL ER 500 MG PO TB24
500.0000 mg | ORAL_TABLET | Freq: Two times a day (BID) | ORAL | 0 refills | Status: DC
Start: 2022-09-29 — End: 2022-12-10

## 2022-09-29 MED ORDER — VITAMIN D (ERGOCALCIFEROL) 1.25 MG (50000 UNIT) PO CAPS
50000.0000 [IU] | ORAL_CAPSULE | ORAL | 0 refills | Status: DC
Start: 2022-09-29 — End: 2022-12-10

## 2022-09-29 NOTE — Progress Notes (Unsigned)
Office: [phone]  /  Fax: [phone]  WEIGHT SUMMARY AND BIOMETRICS  Vitals Temp: 98.1 F (36.7 C) BP: 128/76 Pulse Rate: 83 SpO2: 97 %   Anthropometric Measurements Height: 5\' 5"  (1.651 m) Weight: (!) 366 lb (166 kg) BMI (Calculated): 60.91 Weight at Last Visit: 361 lb Weight Lost Since Last Visit: 0 lb Weight Gained Since Last Visit: 5 lb Starting Weight: 361 lb Peak Weight: 426 lb   Body Composition  Body Fat %: 62 % Fat Mass (lbs): 227 lbs Muscle Mass (lbs): 132.2 lbs Visceral Fat Rating : 27   The ASCVD Risk score (Arnett DK, et al., 2019) failed to calculate for the following reasons:   The patient has a prior MI or stroke diagnosis   RMR: [id number]  Today's Visit #: 2  Starting Date: 09/10/22   HPI  Chief Complaint: OBESITY  Melissa Erickson is here to discuss her progress with her obesity treatment plan. She is on the the Category 2 Plan and states she is following her eating plan approximately 50 % of the time. She states she is exercising 20 minutes 2 times per week.  Interval History:  Since last office visit she has gained 5 lbs. She reports working on implementation of reduced calorie nutritional plan She has been working on not skipping meals, increasing protein intake at every meal, drinking more water, avoiding and / or reducing liquid calories, and avoiding or reducing simple and processed carbohydrates Has stopped gabapentin.  Orixegenic Control: {ACTIONS;DENIES/REPORTS:21021675::"Denies"} problems with appetite and hunger signals.  {ACTIONS;DENIES/REPORTS:21021675::"Denies"} problems with satiety and satiation.  {ACTIONS;DENIES/REPORTS:21021675::"Denies"} problems with eating patterns and portion control.  {ACTIONS;DENIES/REPORTS:21021675::"Denies"} abnormal cravings. {ACTIONS;DENIES/REPORTS:21021675::"Denies"} feeling deprived or restricted.   Barriers identified: low volume of physical acitivity, orthopedic problems, medical conditions or  chronic pain affecting mobility, medical comorbidities, and slow metabolism for age.   Pharmacotherapy for weight loss: She is currently taking no anti-obesity medication.    ASSESSMENT AND PLAN  TREATMENT PLAN FOR OBESITY:  Recommended Dietary Goals  Melissa Erickson is currently in the action stage of change. As such, her goal is to continue weight management plan. She has agreed to: {EMWTLOSSPLAN:29297::"continue current plan"}  Behavioral Intervention  We discussed the following Behavioral Modification Strategies today: {EMWMwtlossstrategies:28914::"increasing lean protein intake","decreasing simple carbohydrates ","increasing vegetables","increasing lower glycemic fruits","increasing water intake","continue to practice mindfulness when eating","planning for success"}.  Additional resources provided today: {EMadditionalresources:29169::"None"}  Recommended Physical Activity Goals  Melissa Erickson has been advised to work up to 150 minutes of moderate intensity aerobic activity a week and strengthening exercises 2-3 times per week for cardiovascular health, weight loss maintenance and preservation of muscle mass.   She has agreed to :  {EMEXERCISE:28847::"Think about ways to increase daily physical activity and overcoming barriers to exercise"}  Pharmacotherapy We discussed various medication options to help Melissa Erickson with her weight loss efforts and we both agreed to : {EMagreedrx:29170::"continue with nutritional and behavioral strategies"}  ASSOCIATED CONDITIONS ADDRESSED TODAY  Gastric bypass status for obesity  OSA (obstructive sleep apnea)  Prediabetes -     metFORMIN HCl ER; Take 1 tablet (500 mg total) by mouth 2 (two) times daily with a meal.  Dispense: 60 tablet; Refill: 0  Abnormal metabolism  RBBB (right bundle branch block)  Pure hypercholesterolemia  Vitamin D deficiency -     Vitamin D (Ergocalciferol); Take 1 capsule (50,000 Units total) by mouth every 7 (seven) days.  Dispense:  16 capsule; Refill: 0    PHYSICAL EXAM:  Blood pressure 128/76, pulse 83, temperature 98.1 F (36.7  C), height 5\' 5"  (1.651 m), weight (!) 366 lb (166 kg), last menstrual period 07/22/2019, SpO2 97%. Body mass index is 60.91 kg/m.  General: She is overweight, cooperative, alert, well developed, and in no acute distress. PSYCH: Has normal mood, affect and thought process.   HEENT: EOMI, sclerae are anicteric. Lungs: Normal breathing effort, no conversational dyspnea. Extremities: No edema.  Neurologic: No gross sensory or motor deficits. No tremors or fasciculations noted.    DIAGNOSTIC DATA REVIEWED:  BMET    Component Value Date/Time   NA 141 09/10/2022 0857   K 4.4 09/10/2022 0857   CL 104 09/10/2022 0857   CO2 26 09/10/2022 0857   GLUCOSE 95 09/10/2022 0857   GLUCOSE 140 (H) 10/27/2021 0854   BUN 11 09/10/2022 0857   CREATININE 0.69 09/10/2022 0857   CALCIUM 9.2 09/10/2022 0857   GFRNONAA >60 10/27/2021 0854   GFRAA >60 07/26/2019 0714   Lab Results  Component Value Date   HGBA1C 6.0 (H) 09/10/2022   HGBA1C 5.7 02/23/2019   Lab Results  Component Value Date   INSULIN 27.6 (H) 09/10/2022   Lab Results  Component Value Date   TSH 1.680 09/10/2022   CBC    Component Value Date/Time   WBC 5.7 09/10/2022 0857   WBC 7.1 10/27/2021 0854   RBC 4.99 09/10/2022 0857   RBC 4.68 10/27/2021 0854   HGB 13.3 09/10/2022 0857   HCT 41.9 09/10/2022 0857   PLT 200 09/10/2022 0857   MCV 84 09/10/2022 0857   MCH 26.7 09/10/2022 0857   MCH 27.1 10/27/2021 0854   MCHC 31.7 09/10/2022 0857   MCHC 31.7 10/27/2021 0854   RDW 13.7 09/10/2022 0857   Iron Studies    Component Value Date/Time   IRON 62 08/23/2021 1156   TIBC 334 08/23/2021 1156   FERRITIN 16 08/23/2021 1156   IRONPCTSAT 19 08/23/2021 1156   Lipid Panel     Component Value Date/Time   CHOL 209 (H) 09/10/2022 0857   TRIG 122 09/10/2022 0857   HDL 44 09/10/2022 0857   CHOLHDL 5 06/19/2021 0851   VLDL  20.4 06/19/2021 0851   LDLCALC 143 (H) 09/10/2022 0857   Hepatic Function Panel     Component Value Date/Time   PROT 6.7 09/10/2022 0857   ALBUMIN 3.7 (L) 09/10/2022 0857   AST 10 09/10/2022 0857   ALT 12 09/10/2022 0857   ALKPHOS 77 09/10/2022 0857   BILITOT 0.3 09/10/2022 0857      Component Value Date/Time   TSH 1.680 09/10/2022 0857   Nutritional Lab Results  Component Value Date   VD25OH 9.0 (L) 09/10/2022   VD25OH 18.21 (L) 08/23/2021   VD25OH 14.09 (L) 06/19/2021     Return in about 3 weeks (around 10/20/2022) for For Weight Mangement with Dr. Rikki Spearing 40 minutes - complex.Marland Kitchen She was informed of the importance of frequent follow up visits to maximize her success with intensive lifestyle modifications for her multiple health conditions.   ATTESTASTION STATEMENTS:  Reviewed by clinician on day of visit: allergies, medications, problem list, medical history, surgical history, family history, social history, and previous encounter notes.     Worthy Rancher, MD

## 2022-09-29 NOTE — Addendum Note (Signed)
Addended by: Paulla Fore A on: 09/29/2022 10:37 AM   Modules accepted: Orders

## 2022-09-30 ENCOUNTER — Other Ambulatory Visit: Payer: Self-pay

## 2022-09-30 MED ORDER — SERTRALINE HCL 100 MG PO TABS: 100.0000 mg | ORAL_TABLET | Freq: Every day | ORAL | 3 refills | Status: DC

## 2022-09-30 NOTE — Assessment & Plan Note (Signed)
Most recent vitamin D levels  Lab Results  Component Value Date   VD25OH 9.0 (L) 09/10/2022   VD25OH 18.21 (L) 08/23/2021   VD25OH 14.09 (L) 06/19/2021     Deficiency state associated with adiposity and may result in leptin resistance, weight gain and fatigue.   Plan: After discussion of benefits, alternative treatment options and side effects patient will be started on vitamin D2 50,000 units 1 tablet weekly for 3-4 months. for a treatment goal level of 50-60 mg/dl. Check levels at that time for response monitoring.

## 2022-09-30 NOTE — Assessment & Plan Note (Signed)
Sleep study 2013, stopped PAP after weight loss never retested.  Has an elevated Epworth therefore I suspect still has sleep disordered breathing.  She has followed up with her PCP and tells me that has been referred for a sleep study.  We discussed the risk associated with untreated sleep apnea and its effect on weight.  Losing 15% of body weight may improve condition.

## 2022-09-30 NOTE — Assessment & Plan Note (Signed)
Most recent A1c is  Lab Results  Component Value Date   HGBA1C 6.0 (H) 09/10/2022   HGBA1C 5.7 02/23/2019    Patient aware of disease state and risk of progression. This may contribute to abnormal cravings, fatigue and diabetic complications without having diabetes.   We have discussed treatment options which include: losing 7 to 10% of body weight, increasing physical activity to a goal of 150 minutes a week at moderate intensity.  Advised to maintain a diet low on simple and processed carbohydrates.  After discussion of benefits and side effects she will be started on metformin XR 500 mg twice a day for pharmacoprophylaxis.

## 2022-09-30 NOTE — Assessment & Plan Note (Signed)
 Patient has a slower than predicted metabolism. IC 1166 vs. calculated 2206.This may contribute to weight gain, chronic fatigue and difficulty losing weight. Likely secondary to metabolic adaptations following gastric bypass, loss of muscle mass, untreated sleep apnea and chronic skipping of meals with inadequate protein intake.  We reviewed measures to improve metabolism including progressive strengthening exercises, increasing protein intake at every meal and maintaining adequate hydration and sleep.

## 2022-09-30 NOTE — Assessment & Plan Note (Signed)
LDL is not at goal. Elevated LDL may be secondary to nutrition, genetics and spillover effect from excess adiposity. Recommended LDL goal is <70 to reduce the risk of fatty streaks and the progression to obstructive ASCVD in the future.   Her 10 year risk is: The ASCVD Risk score (Arnett DK, et al., 2019) failed to calculate for the following reasons:   The patient has a prior MI or stroke diagnosis  Lab Results  Component Value Date   CHOL 209 (H) 09/10/2022   HDL 44 09/10/2022   LDLCALC 143 (H) 09/10/2022   TRIG 122 09/10/2022   CHOLHDL 5 06/19/2021    Continue weight loss therapy, losing 10% or more of body weight may improve condition. Also advised to reduce saturated fats in diet to less than 10% of daily calories.  Will review records further as there is question of a non-ST elevation MI but she is not on statin therapy.  I could not find heart catheterization on file patient does not recall events of her hospitalization.  Might have been stress related to ischemia.

## 2022-09-30 NOTE — Assessment & Plan Note (Signed)
Gastric Sleeve 2015, [name]. Complicated with postoperative pneumonia Presurgical weight 426, nadir 310 at 12 months with gradual weight regain starting 2017.  Patient likely experiencing weight regain secondary to metabolic and behavioral maladaptations.  Was referred to our clinic for assistance with pharmacological management.  She has an abnormal metabolism and untreated OSA.  She is also on obesogenic medication.  Her gabapentin was recently discontinued.

## 2022-09-30 NOTE — Telephone Encounter (Signed)
LAST APPOINTMENT DATE: 05/19/2022   NEXT APPOINTMENT DATE: Visit date not found    LAST REFILL: 06/01/2019 by historical  provider

## 2022-09-30 NOTE — Assessment & Plan Note (Addendum)
Found incidentally on EKG.  She is asymptomatic.  She had a non-STEMI secondary to sepsis postoperatively in 2015.  So this could be ischemic, structural or degenerative.  Echocardiogram is being scheduled.

## 2022-10-01 MED ORDER — ALPRAZOLAM 0.5 MG PO TABS: 0.5000 mg | ORAL_TABLET | Freq: Every day | ORAL | 0 refills | Status: DC | PRN

## 2022-10-02 ENCOUNTER — Ambulatory Visit (HOSPITAL_COMMUNITY)
Admission: RE | Admit: 2022-10-02 | Discharge: 2022-10-02 | Disposition: A | Discharge status: Home / Self Care | Payer: BC Managed Care – PPO | Source: Ambulatory Visit | Attending: Internal Medicine | Admitting: Internal Medicine

## 2022-10-02 DIAGNOSIS — I4510 Unspecified right bundle-branch block: Secondary | ICD-10-CM | POA: Diagnosis not present

## 2022-10-02 DIAGNOSIS — G4730 Sleep apnea, unspecified: Secondary | ICD-10-CM | POA: Insufficient documentation

## 2022-10-02 LAB — ECHOCARDIOGRAM COMPLETE
AR max vel: 2.61 cm2
AV Area VTI: 3.1 cm2
AV Area mean vel: 3.06 cm2
AV Mean grad: 6 mmHg
AV Peak grad: 11.8 mmHg
Ao pk vel: 1.72 m/s
Area-P 1/2: 3.27 cm2
S' Lateral: 2.7 cm

## 2022-10-28 ENCOUNTER — Ambulatory Visit (INDEPENDENT_AMBULATORY_CARE_PROVIDER_SITE_OTHER): Payer: BC Managed Care – PPO | Admitting: Internal Medicine

## 2022-10-29 ENCOUNTER — Ambulatory Visit (INDEPENDENT_AMBULATORY_CARE_PROVIDER_SITE_OTHER): Payer: BC Managed Care – PPO | Admitting: Internal Medicine

## 2022-12-02 ENCOUNTER — Ambulatory Visit (INDEPENDENT_AMBULATORY_CARE_PROVIDER_SITE_OTHER): Payer: BC Managed Care – PPO | Admitting: Family Medicine

## 2022-12-10 ENCOUNTER — Other Ambulatory Visit (INDEPENDENT_AMBULATORY_CARE_PROVIDER_SITE_OTHER): Payer: Self-pay | Admitting: Internal Medicine

## 2022-12-10 ENCOUNTER — Ambulatory Visit (INDEPENDENT_AMBULATORY_CARE_PROVIDER_SITE_OTHER): Payer: BC Managed Care – PPO | Admitting: Internal Medicine

## 2022-12-10 ENCOUNTER — Encounter (INDEPENDENT_AMBULATORY_CARE_PROVIDER_SITE_OTHER): Payer: Self-pay | Admitting: Internal Medicine

## 2022-12-10 VITALS — BP 136/82 | HR 75 | Temp 97.6°F | Ht 66.0 in | Wt 359.0 lb

## 2022-12-10 DIAGNOSIS — E559 Vitamin D deficiency, unspecified: Secondary | ICD-10-CM

## 2022-12-10 DIAGNOSIS — G4733 Obstructive sleep apnea (adult) (pediatric): Secondary | ICD-10-CM

## 2022-12-10 DIAGNOSIS — R7303 Prediabetes: Secondary | ICD-10-CM

## 2022-12-10 DIAGNOSIS — Z9884 Bariatric surgery status: Secondary | ICD-10-CM

## 2022-12-10 DIAGNOSIS — Z6841 Body Mass Index (BMI) 40.0 and over, adult: Secondary | ICD-10-CM

## 2022-12-10 MED ORDER — METFORMIN HCL ER 500 MG PO TB24
500.0000 mg | ORAL_TABLET | Freq: Two times a day (BID) | ORAL | 0 refills | Status: AC
Start: 2022-12-10 — End: ?

## 2022-12-10 MED ORDER — ZEPBOUND 2.5 MG/0.5ML ~~LOC~~ SOAJ
2.5000 mg | SUBCUTANEOUS | 0 refills | Status: DC
Start: 2022-12-10 — End: 2023-02-17

## 2022-12-10 MED ORDER — VITAMIN D (ERGOCALCIFEROL) 1.25 MG (50000 UNIT) PO CAPS
50000.0000 [IU] | ORAL_CAPSULE | ORAL | 0 refills | Status: DC
Start: 2022-12-10 — End: 2023-04-16

## 2022-12-10 NOTE — Assessment & Plan Note (Signed)
She has completed 3 to 4 months of supplementation because of marked deficiency I will like for her to continue for another 3 months.  We will recheck vitamin D and hemoglobin A1c at that time.

## 2022-12-10 NOTE — Assessment & Plan Note (Addendum)
Sleep study 2013, stopped PAP after weight loss never retested.  Has an elevated Epworth therefore I suspect still has sleep disordered breathing.    We again reviewed symptoms of OSA and risk associated with condition.  OSA will also affect her ability to lose weight as it affects hunger hormones as well as metabolism.  She was advised to follow-up with her primary care team about getting a home sleep study.  Losing 15% of body weight may improve condition.

## 2022-12-10 NOTE — Assessment & Plan Note (Addendum)
Gastric Sleeve 2015, [name]. Complicated with postoperative pneumonia Presurgical weight 426, nadir 310 at 12 months with gradual weight regain starting 2017.

## 2022-12-10 NOTE — Progress Notes (Signed)
Office: [phone]  /  Fax: [phone]  WEIGHT SUMMARY AND BIOMETRICS  Vitals Temp: 97.6 F (36.4 C) BP: 136/82 Pulse Rate: 75 SpO2: 98 %   Anthropometric Measurements Height: 5\' 6"  (1.676 m) Weight: (!) 359 lb (162.8 kg) BMI (Calculated): 57.97 Weight at Last Visit: 366 lb Weight Lost Since Last Visit: 7 lb Weight Gained Since Last Visit: 0 lb Starting Weight: 361lb Total Weight Loss (lbs): 2 lb (0.907 kg)   Body Composition  Body Fat %: 59.8 % Fat Mass (lbs): 214.8 lbs Muscle Mass (lbs): 137.2 lbs Total Body Water (lbs): 0 lbs Visceral Fat Rating : 24    RMR: 1166  Today's Visit #: 3  Starting Date: 09/29/22   HPI  Chief Complaint: OBESITY  Melissa Erickson is here to discuss her progress with her obesity treatment plan. She is on the the Category 2 Plan and states she is following her eating plan approximately 80 % of the time. She states she is being more active.exercising.  Interval History:  Since last office visit she has lost 7 pounds. She reports good adherence to reduced calorie nutritional plan. She has been working on reading food labels, not skipping meals, increasing protein intake at every meal, drinking more water, making healthier choices, reducing portion sizes, and incorporating more whole foods  Orexigenic Control: Reports problems with appetite and hunger signals.  Reports problems with satiety and satiation.  Denies problems with eating patterns and portion control.  Denies abnormal cravings. Denies feeling deprived or restricted.   Barriers identified: orthopedic problems, medical conditions or chronic pain affecting mobility, presence of obesogenic drugs, slow metabolism for age, and sleep apnea.   Pharmacotherapy for weight loss: She is currently taking no anti-obesity medication.    ASSESSMENT AND PLAN  TREATMENT PLAN FOR OBESITY:  Recommended Dietary Goals  Melissa Erickson is currently in the action stage of change. As such, her goal is  to continue weight management plan. She has agreed to: continue current plan  Behavioral Intervention  We discussed the following Behavioral Modification Strategies today: continue to work on maintaining a reduced calorie state, getting the recommended amount of protein, incorporating whole foods, making healthy choices, staying well hydrated and practicing mindfulness when eating..  Additional resources provided today: Handout on symptoms of sleep apnea, health risk and its effect on weight management  Recommended Physical Activity Goals  Melissa Erickson has been advised to work up to 150 minutes of moderate intensity aerobic activity a week and strengthening exercises 2-3 times per week for cardiovascular health, weight loss maintenance and preservation of muscle mass.   She has agreed to :  Think about enjoyable ways to increase daily physical activity and overcoming barriers to exercise and Increase physical activity in their day and reduce sedentary time (increase NEAT).  Pharmacotherapy We discussed various medication options to help Melissa Erickson with her weight loss efforts and we both agreed to : start anti-obesity medication.  In addition to reduced calorie nutrition plan (RCNP), behavioral strategies and physical activity, Melissa Erickson would benefit from pharmacotherapy to assist with hunger signals, satiety and cravings. This will reduce obesity-related health risks by inducing weight loss, and help reduce food consumption and adherence to [facility]) . It may also improve QOL by improving self-confidence and reduce the  setbacks associated with metabolic adaptations following gastric bypass.  She also has OSA, PCOS, prediabetes and hypercholesterolemia which increases patient risk for future complications.  After discussion of treatment options, mechanisms of action, benefits, side effects, contraindications and shared decision making she  is agreeable to starting [name] 2.5 mg once a week. Patient also made aware  that medication is indicated for long-term management of obesity and the risk of weight regain following discontinuation of treatment and hence the importance of adhering to medical weight loss plan.  We demonstrated use of device and patient using teach back method was able to demonstrate proper technique.  ASSOCIATED CONDITIONS ADDRESSED TODAY  OSA (obstructive sleep apnea) Assessment & Plan: Sleep study 2013, stopped PAP after weight loss never retested.  Has an elevated Epworth therefore I suspect still has sleep disordered breathing.    We again reviewed symptoms of OSA and risk associated with condition.  OSA will also affect her ability to lose weight as it affects hunger hormones as well as metabolism.  She was advised to follow-up with her primary care team about getting a home sleep study.  Losing 15% of body weight may improve condition.  Orders: -     [name]; Inject 2.5 mg into the skin once a week.  Dispense: 2 mL; Refill: 0  Vitamin D deficiency Assessment & Plan: She has completed 3 to 4 months of supplementation because of marked deficiency I will like for her to continue for another 3 months.  We will recheck vitamin D and hemoglobin A1c at that time.  Orders: -     Vitamin D (Ergocalciferol); Take 1 capsule (50,000 Units total) by mouth every 7 (seven) days.  Dispense: 16 capsule; Refill: 0  Prediabetes Assessment & Plan: Most recent A1c is  Lab Results  Component Value Date   HGBA1C 6.0 (H) 09/10/2022   HGBA1C 5.7 02/23/2019    Patient aware of disease state and risk of progression. This may contribute to abnormal cravings, fatigue and diabetic complications without having diabetes.   She is working on reducing simple and added carbs in her diet  She is currently on metformin for pharmacoprophylaxis without any adverse effects and will continue medication.  I feel the patient is also a good candidate for GLP-1 as she is burdened by multiple obesity related  medical conditions.  We will prescribe [name] 2.5 mg once a week   Orders: -     metFORMIN HCl ER; Take 1 tablet (500 mg total) by mouth 2 (two) times daily with a meal.  Dispense: 60 tablet; Refill: 0 -     [name]; Inject 2.5 mg into the skin once a week.  Dispense: 2 mL; Refill: 0  Gastric bypass status for obesity Assessment & Plan: Gastric Sleeve 2015, [name]. Complicated with postoperative pneumonia Presurgical weight 426, nadir 310 at 12 months with gradual weight regain starting 2017.  Orders: -     [name]; Inject 2.5 mg into the skin once a week.  Dispense: 2 mL; Refill: 0  Morbid obesity with BMI of 50.0-59.9, adult [facility]) Assessment & Plan: See obesity treatment plan  Orders: -     [name]; Inject 2.5 mg into the skin once a week.  Dispense: 2 mL; Refill: 0    PHYSICAL EXAM:  Blood pressure 136/82, pulse 75, temperature 97.6 F (36.4 C), height 5\' 6"  (1.676 m), weight (!) 359 lb (162.8 kg), last menstrual period 07/22/2019, SpO2 98%. Body mass index is 57.94 kg/m.  General: She is overweight, cooperative, alert, well developed, and in no acute distress. PSYCH: Has normal mood, affect and thought process.   HEENT: EOMI, sclerae are anicteric. Lungs: Normal breathing effort, no conversational dyspnea. Extremities: No edema.  Neurologic: No gross sensory or motor deficits.  No tremors or fasciculations noted.    DIAGNOSTIC DATA REVIEWED:  BMET    Component Value Date/Time   NA 141 09/10/2022 0857   K 4.4 09/10/2022 0857   CL 104 09/10/2022 0857   CO2 26 09/10/2022 0857   GLUCOSE 95 09/10/2022 0857   GLUCOSE 140 (H) 10/27/2021 0854   BUN 11 09/10/2022 0857   CREATININE 0.69 09/10/2022 0857   CALCIUM 9.2 09/10/2022 0857   GFRNONAA >60 10/27/2021 0854   GFRAA >60 07/26/2019 0714   Lab Results  Component Value Date   HGBA1C 6.0 (H) 09/10/2022   HGBA1C 5.7 02/23/2019   Lab Results  Component Value Date   INSULIN 27.6 (H) 09/10/2022   Lab  Results  Component Value Date   TSH 1.680 09/10/2022   CBC    Component Value Date/Time   WBC 5.7 09/10/2022 0857   WBC 7.1 10/27/2021 0854   RBC 4.99 09/10/2022 0857   RBC 4.68 10/27/2021 0854   HGB 13.3 09/10/2022 0857   HCT 41.9 09/10/2022 0857   PLT 200 09/10/2022 0857   MCV 84 09/10/2022 0857   MCH 26.7 09/10/2022 0857   MCH 27.1 10/27/2021 0854   MCHC 31.7 09/10/2022 0857   MCHC 31.7 10/27/2021 0854   RDW 13.7 09/10/2022 0857   Iron Studies    Component Value Date/Time   IRON 62 08/23/2021 1156   TIBC 334 08/23/2021 1156   FERRITIN 16 08/23/2021 1156   IRONPCTSAT 19 08/23/2021 1156   Lipid Panel     Component Value Date/Time   CHOL 209 (H) 09/10/2022 0857   TRIG 122 09/10/2022 0857   HDL 44 09/10/2022 0857   CHOLHDL 5 06/19/2021 0851   VLDL 20.4 06/19/2021 0851   LDLCALC 143 (H) 09/10/2022 0857   Hepatic Function Panel     Component Value Date/Time   PROT 6.7 09/10/2022 0857   ALBUMIN 3.7 (L) 09/10/2022 0857   AST 10 09/10/2022 0857   ALT 12 09/10/2022 0857   ALKPHOS 77 09/10/2022 0857   BILITOT 0.3 09/10/2022 0857      Component Value Date/Time   TSH 1.680 09/10/2022 0857   Nutritional Lab Results  Component Value Date   VD25OH 9.0 (L) 09/10/2022   VD25OH 18.21 (L) 08/23/2021   VD25OH 14.09 (L) 06/19/2021     Return in about 3 weeks (around 12/31/2022) for For Weight Mangement with Dr. Rikki Spearing.Marland Kitchen She was informed of the importance of frequent follow up visits to maximize her success with intensive lifestyle modifications for her multiple health conditions.   ATTESTASTION STATEMENTS:  Reviewed by clinician on day of visit: allergies, medications, problem list, medical history, surgical history, family history, social history, and previous encounter notes.     Worthy Rancher, MD

## 2022-12-10 NOTE — Assessment & Plan Note (Signed)
Most recent A1c is  Lab Results  Component Value Date   HGBA1C 6.0 (H) 09/10/2022   HGBA1C 5.7 02/23/2019    Patient aware of disease state and risk of progression. This may contribute to abnormal cravings, fatigue and diabetic complications without having diabetes.   She is working on reducing simple and added carbs in her diet  She is currently on metformin for pharmacoprophylaxis without any adverse effects and will continue medication.  I feel the patient is also a good candidate for GLP-1 as she is burdened by multiple obesity related medical conditions.  We will prescribe [name] 2.5 mg once a week

## 2022-12-10 NOTE — Assessment & Plan Note (Signed)
 See obesity treatment plan

## 2022-12-11 ENCOUNTER — Encounter (INDEPENDENT_AMBULATORY_CARE_PROVIDER_SITE_OTHER): Payer: Self-pay | Admitting: Internal Medicine

## 2022-12-11 ENCOUNTER — Telehealth: Payer: Self-pay

## 2022-12-11 NOTE — Telephone Encounter (Signed)
Started PA for [name] via covermymeds.

## 2022-12-22 NOTE — Telephone Encounter (Signed)
PA for [name] came back NA, stating too soon to refill.   N/A This drug is too soon to refill. Please advise the pharmacy to either resubmit on the next fill date or contact the Pharmacy Help Line at [phone] for assistance

## 2022-12-23 ENCOUNTER — Other Ambulatory Visit: Payer: Self-pay | Admitting: Family Medicine

## 2022-12-23 DIAGNOSIS — Z1231 Encounter for screening mammogram for malignant neoplasm of breast: Secondary | ICD-10-CM

## 2023-01-08 ENCOUNTER — Ambulatory Visit (INDEPENDENT_AMBULATORY_CARE_PROVIDER_SITE_OTHER): Payer: BC Managed Care – PPO | Admitting: Internal Medicine

## 2023-01-12 ENCOUNTER — Ambulatory Visit: Payer: BC Managed Care – PPO

## 2023-02-12 ENCOUNTER — Encounter: Payer: BC Managed Care – PPO | Admitting: Family Medicine

## 2023-02-17 ENCOUNTER — Other Ambulatory Visit (HOSPITAL_COMMUNITY): Payer: Self-pay

## 2023-02-17 ENCOUNTER — Encounter (INDEPENDENT_AMBULATORY_CARE_PROVIDER_SITE_OTHER): Payer: Self-pay | Admitting: Internal Medicine

## 2023-02-17 ENCOUNTER — Ambulatory Visit (INDEPENDENT_AMBULATORY_CARE_PROVIDER_SITE_OTHER): Payer: BC Managed Care – PPO | Admitting: Internal Medicine

## 2023-02-17 VITALS — BP 111/57 | HR 99 | Temp 98.2°F | Ht 66.0 in | Wt 355.0 lb

## 2023-02-17 DIAGNOSIS — R7303 Prediabetes: Secondary | ICD-10-CM

## 2023-02-17 DIAGNOSIS — Z6841 Body Mass Index (BMI) 40.0 and over, adult: Secondary | ICD-10-CM

## 2023-02-17 DIAGNOSIS — Z9884 Bariatric surgery status: Secondary | ICD-10-CM

## 2023-02-17 DIAGNOSIS — G4733 Obstructive sleep apnea (adult) (pediatric): Secondary | ICD-10-CM | POA: Diagnosis not present

## 2023-02-17 MED ORDER — ZEPBOUND 2.5 MG/0.5ML ~~LOC~~ SOAJ
2.5000 mg | SUBCUTANEOUS | 0 refills | Status: DC
Start: 2023-02-17 — End: 2023-04-07
  Filled 2023-02-17: qty 2, 28d supply, fill #0

## 2023-02-17 NOTE — Progress Notes (Signed)
 Office: [phone]  /  Fax: [phone]  Weight Summary And Biometrics  Vitals Temp: 98.2 F (36.8 C) BP: (!) 111/57 Pulse Rate: 99 SpO2: 99 %   Anthropometric Measurements Height: 5' 6 (1.676 m) Weight: (!) 355 lb (161 kg) BMI (Calculated): 57.33 Weight at Last Visit: 359 lb Weight Lost Since Last Visit: 4 lb Weight Gained Since Last Visit: 0 Starting Weight: 361 lb Total Weight Loss (lbs): 6 lb (2.722 kg) Peak Weight: 420 lb   Body Composition  Body Fat %: 57.8 % Fat Mass (lbs): 205.6 lbs Muscle Mass (lbs): 142.6 lbs Visceral Fat Rating : 23    No data recorded Today's Visit #: 4  Starting Date: 09/29/22   Subjective   Chief Complaint: Obesity  Melissa Erickson is here to discuss her progress with her obesity treatment plan. She is on the the Category 2 Plan and states she is following her eating plan approximately 65-70 % of the time. She states she is not exercising due to back issues.  Interval History:   Discussed the use of AI scribe software for clinical note transcription with the patient, who gave verbal consent to proceed.  History of Present Illness   The patient, who has a history of gastric bypass surgery and prediabetes, presented for a follow-up visit regarding weight management. She reported a surprising weight loss of 4-5 pounds over the holiday season, despite her expectation of weight gain. The patient also mentioned recurring back problems, which have led to a decrease in her physical activity levels.  The patient has been experiencing sleep issues and has not had a sleep study since 2015. She reported a history of severe sleep apnea, which was managed with CPAP therapy. However, after significant weight loss post-gastric bypass, it was noted in her medical records that she no longer required CPAP.  Regarding her diet, the patient reported a shift towards whole foods and a reduction in carbohydrate intake. She has been avoiding bread and sugar,  and significantly cut back on soda consumption. She expressed a need for better meal preparation, particularly for her lunches, to avoid resorting to school lunches due to time constraints in the morning.  The patient's previous attempts to get this medication were unsuccessful due to high out-of-pocket costs. The patient is transitioning to a new insurance provider, [employer], and plans to update her insurance information at the pharmacy.  In summary, the patient is actively engaged in weight management efforts, despite some challenges with physical activity due to back problems and dietary management due to time constraints. She is also dealing with sleep issues, potentially related to her history of severe sleep apnea. The patient is seeking to optimize her insurance coverage for weight loss medications to support her weight management efforts.      Orexigenic Control:  Reports problems with appetite and hunger signals.  Reports problems with satiety and satiation.  Denies problems with eating patterns and portion control.  Denies abnormal cravings. Denies feeling deprived or restricted.   Barriers identified: strong hunger signals and impaired satiety / inhibitory control, low volume of physical activity at present , slow metabolism for age, sleep apnea, and history of gastric bypass .   Pharmacotherapy for weight loss: She is currently taking no anti-obesity medication.   Assessment and Plan   Treatment Plan For Obesity:  Recommended Dietary Goals  Tailor is currently in the action stage of change. As such, her goal is to continue weight management plan. She has agreed to: continue current plan  Behavioral Intervention  We discussed the following Behavioral Modification Strategies today: continue to work on maintaining a reduced calorie state, getting the recommended amount of protein, incorporating whole foods, making healthy choices, staying well hydrated and practicing mindfulness  when eating..  Additional resources provided today: None  Recommended Physical Activity Goals  Kynsleigh has been advised to work up to 150 minutes of moderate intensity aerobic activity a week and strengthening exercises 2-3 times per week for cardiovascular health, weight loss maintenance and preservation of muscle mass.   She has agreed to :  Think about enjoyable ways to increase daily physical activity and overcoming barriers to exercise and Increase physical activity in their day and reduce sedentary time (increase NEAT).  Pharmacotherapy  We discussed various medication options to help Ellee with her weight loss efforts and we both agreed to : start anti-obesity medication.  In addition to reduced calorie nutrition plan (RCNP), behavioral strategies and physical activity, Myrical would benefit from pharmacotherapy to assist with hunger signals, satiety and cravings. This will reduce obesity-related health risks by inducing weight loss, and help reduce food consumption and adherence to [facility]) . It may also improve QOL by improving self-confidence and reduce the  setbacks associated with metabolic adaptations.  Patient also has a history of gastric bypass with metabolic adaptations and has severe obstructive sleep apnea.  She is a good candidate for GLP-1 therapy particularly [name]  which is now FDA approved for moderate to severe sleep apnea.  After discussion of treatment options, mechanisms of action, benefits, side effects, contraindications and shared decision making she is agreeable to starting [name]  2.5 mg once a week. Patient also made aware that medication is indicated for long-term management of obesity and the risk of weight regain following discontinuation of treatment and hence the importance of adhering to medical weight loss plan.  We demonstrated use of device and patient using teach back method was able to demonstrate proper technique.  Associated Conditions Addressed Today  OSA  (obstructive sleep apnea) Assessment & Plan: Sleep study 2013 according to patient she had severe sleep apnea.  This was treated with PAP therapy initially and then gastric bypass but she still has symptoms of sleep disordered breathing and a high Epworth score.  She has been trying to get sleep study scheduled as she has gained weight with primary care team but has encountered some delays because of rescheduling.  We will go ahead and refer patient [facility].  We again reviewed symptoms of OSA and risk associated with condition.  OSA will also affect her ability to lose weight as it affects hunger hormones as well as metabolism.  Losing 15% of body weight may improve condition.  She also benefits from [name] .  Medication is now FDA approved for the management of moderate to severe sleep apnea.  After discussion of benefits and side effects she will be started on [name]  2.5 mg once a week  Orders: -     Ambulatory referral to Sleep Studies -     [name] ; Inject 2.5 mg into the skin once a week.  Dispense: 2 mL; Refill: 0  Morbid obesity with BMI of 50.0-59.9, adult [facility]) Assessment & Plan: Presents for medical weight management. Lost 4-5 pounds over the holidays. Discussed meal preparation, low-carb, high-protein diet, avoiding liquid calories, and focusing on whole foods. Discussed [name]  for weight loss and sleep apnea improvement. Need to update insurance information for coverage. - Send prescription for [name]  2.5 mg  to [facility] on [address] - Advise to update insurance information at the pharmacy - Contact pharmacy on Thursday to check prescription status - Encourage meal preparation and planning, especially on weekends - Refer to [email or web address] for healthy, high-protein, low-carb recipes - Schedule follow-up appointment in 3 weeks  Orders: -     Ambulatory referral to Sleep Studies -     [name] ; Inject 2.5 mg  into the skin once a week.  Dispense: 2 mL; Refill: 0  Prediabetes Assessment & Plan: Prediabetes Discussed maintaining a low-carb, high-protein diet to manage blood sugar levels and prevent progression to diabetes. Emphasized whole foods and avoiding simple carbohydrates. - Encourage low-carb, high-protein diet - Monitor blood sugar levels regularly -Start [name]  for pharmacoprophylaxis  Orders: -     [name] ; Inject 2.5 mg into the skin once a week.  Dispense: 2 mL; Refill: 0  Gastric bypass status for obesity Assessment & Plan: Gastric Sleeve 2015, [name]. Complicated with postoperative pneumonia Presurgical weight 426, nadir 310 at 12 months with gradual weight regain starting 2017.  Orders: -     [name] ; Inject 2.5 mg into the skin once a week.  Dispense: 2 mL; Refill: 0        General Health Maintenance Discussed maintaining a healthy diet and regular physical activity. Emphasized meal preparation and avoiding liquid calories. - Encourage regular physical activity as tolerated - Advise on meal preparation and planning - Refer to [email or web address] for healthy recipes  Follow-up - Schedule follow-up appointment in 3 weeks - Send a message in the portal if there are any issues with the medication or sleep study.       Objective   Physical Exam:  Blood pressure (!) 111/57, pulse 99, temperature 98.2 F (36.8 C), height 5' 6 (1.676 m), weight (!) 355 lb (161 kg), last menstrual period 07/22/2019, SpO2 99%. Body mass index is 57.3 kg/m.  General: She is overweight, cooperative, alert, well developed, and in no acute distress. PSYCH: Has normal mood, affect and thought process.   HEENT: EOMI, sclerae are anicteric. Lungs: Normal breathing effort, no conversational dyspnea. Extremities: No edema.  Neurologic: No gross sensory or motor deficits. No tremors or fasciculations noted.    Diagnostic Data Reviewed:  BMET    Component Value Date/Time   NA  141 09/10/2022 0857   K 4.4 09/10/2022 0857   CL 104 09/10/2022 0857   CO2 26 09/10/2022 0857   GLUCOSE 95 09/10/2022 0857   GLUCOSE 140 (H) 10/27/2021 0854   BUN 11 09/10/2022 0857   CREATININE 0.69 09/10/2022 0857   CALCIUM  9.2 09/10/2022 0857   GFRNONAA >60 10/27/2021 0854   GFRAA >60 07/26/2019 0714   Lab Results  Component Value Date   HGBA1C 6.0 (H) 09/10/2022   HGBA1C 5.7 02/23/2019   Lab Results  Component Value Date   INSULIN  27.6 (H) 09/10/2022   Lab Results  Component Value Date   TSH 1.680 09/10/2022   CBC    Component Value Date/Time   WBC 5.7 09/10/2022 0857   WBC 7.1 10/27/2021 0854   RBC 4.99 09/10/2022 0857   RBC 4.68 10/27/2021 0854   HGB 13.3 09/10/2022 0857   HCT 41.9 09/10/2022 0857   PLT 200 09/10/2022 0857   MCV 84 09/10/2022 0857   MCH 26.7 09/10/2022 0857   MCH 27.1 10/27/2021 0854   MCHC 31.7 09/10/2022 0857   MCHC 31.7 10/27/2021 0854   RDW 13.7 09/10/2022 0857   Iron Studies  Component Value Date/Time   IRON 62 08/23/2021 1156   TIBC 334 08/23/2021 1156   FERRITIN 16 08/23/2021 1156   IRONPCTSAT 19 08/23/2021 1156   Lipid Panel     Component Value Date/Time   CHOL 209 (H) 09/10/2022 0857   TRIG 122 09/10/2022 0857   HDL 44 09/10/2022 0857   CHOLHDL 5 06/19/2021 0851   VLDL 20.4 06/19/2021 0851   LDLCALC 143 (H) 09/10/2022 0857   Hepatic Function Panel     Component Value Date/Time   PROT 6.7 09/10/2022 0857   ALBUMIN 3.7 (L) 09/10/2022 0857   AST 10 09/10/2022 0857   ALT 12 09/10/2022 0857   ALKPHOS 77 09/10/2022 0857   BILITOT 0.3 09/10/2022 0857      Component Value Date/Time   TSH 1.680 09/10/2022 0857   Nutritional Lab Results  Component Value Date   VD25OH 9.0 (L) 09/10/2022   VD25OH 18.21 (L) 08/23/2021   VD25OH 14.09 (L) 06/19/2021    Follow-Up   Return in about 3 weeks (around 03/10/2023) for For Weight Mangement with Dr. Francyne.SABRA She was informed of the importance of frequent follow up visits  to maximize her success with intensive lifestyle modifications for her multiple health conditions.  Attestation Statement   Reviewed by clinician on day of visit: allergies, medications, problem list, medical history, surgical history, family history, social history, and previous encounter notes.     Lucas Francyne, MD

## 2023-02-18 NOTE — Assessment & Plan Note (Signed)
 Sleep study 2013 according to patient she had severe sleep apnea.  This was treated with PAP therapy initially and then gastric bypass but she still has symptoms of sleep disordered breathing and a high Epworth score.  She has been trying to get sleep study scheduled as she has gained weight with primary care team but has encountered some delays because of rescheduling.  We will go ahead and refer [facility].  We again reviewed symptoms of OSA and risk associated with condition.  OSA will also affect her ability to lose weight as it affects hunger hormones as well as metabolism.  Losing 15% of body weight may improve condition.  She also benefits from [name] .  Medication is now FDA approved for the management of moderate to severe sleep apnea.  After discussion of benefits and side effects she will be started on [name]  2.5 mg once a week

## 2023-02-18 NOTE — Assessment & Plan Note (Signed)
 Presents for medical weight management. Lost 4-5 pounds over the holidays. Discussed meal preparation, low-carb, high-protein diet, avoiding liquid calories, and focusing on whole foods. Discussed [name]  for weight loss and sleep apnea improvement. Need to update insurance information for coverage. - Send prescription for [name]  2.5 mg to [facility] on [address] - Advise to update insurance information at the pharmacy - Contact pharmacy on Thursday to check prescription status - Encourage meal preparation and planning, especially on weekends - Refer to [email or web address] for healthy, high-protein, low-carb recipes - Schedule follow-up appointment in 3 weeks

## 2023-02-18 NOTE — Assessment & Plan Note (Signed)
Gastric Sleeve 2015, [name]. Complicated with postoperative pneumonia Presurgical weight 426, nadir 310 at 12 months with gradual weight regain starting 2017.

## 2023-02-18 NOTE — Assessment & Plan Note (Signed)
 Prediabetes Discussed maintaining a low-carb, high-protein diet to manage blood sugar levels and prevent progression to diabetes. Emphasized whole foods and avoiding simple carbohydrates. - Encourage low-carb, high-protein diet - Monitor blood sugar levels regularly -Start [name]  for pharmacoprophylaxis

## 2023-02-25 ENCOUNTER — Other Ambulatory Visit (HOSPITAL_COMMUNITY): Payer: Self-pay

## 2023-02-26 ENCOUNTER — Encounter (INDEPENDENT_AMBULATORY_CARE_PROVIDER_SITE_OTHER): Payer: Self-pay | Admitting: Internal Medicine

## 2023-02-27 ENCOUNTER — Other Ambulatory Visit (HOSPITAL_COMMUNITY): Payer: Self-pay

## 2023-03-04 ENCOUNTER — Ambulatory Visit: Payer: Self-pay | Admitting: Family Medicine

## 2023-03-04 ENCOUNTER — Encounter: Payer: Self-pay | Admitting: Family Medicine

## 2023-03-04 VITALS — BP 124/88 | HR 90 | Temp 97.7°F | Ht 66.0 in | Wt 355.6 lb

## 2023-03-04 DIAGNOSIS — F411 Generalized anxiety disorder: Secondary | ICD-10-CM | POA: Diagnosis not present

## 2023-03-04 DIAGNOSIS — E559 Vitamin D deficiency, unspecified: Secondary | ICD-10-CM

## 2023-03-04 DIAGNOSIS — L299 Pruritus, unspecified: Secondary | ICD-10-CM | POA: Diagnosis not present

## 2023-03-04 DIAGNOSIS — Z6841 Body Mass Index (BMI) 40.0 and over, adult: Secondary | ICD-10-CM

## 2023-03-04 NOTE — Progress Notes (Signed)
 Subjective  CC:  Chief Complaint  Patient presents with   medication change    HPI: Melissa Erickson is a 43 y.o. female who presents to the office today to address the problems listed above in the chief complaint. Discussed the use of AI scribe software for clinical note transcription with the patient, who gave verbal consent to proceed.  History of Present Illness   The patient presents with a recent onset of generalized itching, which she attributes to her nightly dose of Sertraline . The itching, described as 'all over,' begins within 20-30 minutes of taking the medication and persists until she falls asleep. She denies any associated rash or other symptoms. The patient has been on Sertraline  for for several years and dose was increased to 100mg  nightly for GAD and secondary insomnia in 06/2021. the itching started approximately five to six days ago.  The patient also mentions a history of low Vitamin D  levels, which were last checked in July and were being managed with a supplement. She is due for a follow-up with her wellness provider in two weeks. The patient also mentions a pending sleep study but provides no further details.  [facility]: I reviewed recent notes. I reviewed labs from July   Assessment  1. Pruritus   2. Vitamin D  deficiency   3. Morbid obesity with BMI of 50.0-59.9, adult (HCC) Chronic  4. GAD (generalized anxiety disorder)      Plan  Assessment and Plan    pruritus Reports generalized itching starting 5-6 days ago, occurring within 30 minutes of taking sertraline  at night. No associated rash. Itching resolves by morning. On sertraline  for sleep and anxiety. Suspects sertraline  as the cause but acknowledges the possibility of dry skin. Discussed risks of abrupt discontinuation, including withdrawal symptoms, and benefits of identifying the cause of itching. Advised to use Benadryl  during the weaning process. - Wean off sertraline  over two  weeks: take every other day for a week, then twice a week (Monday and Thursday) for the second week, then stop - Use Benadryl  on the nights sertraline  is taken - Monitor for any rash or swelling; if these occur, stop sertraline  immediately - Use a good moisturizer to address potential dry skin - will need to monitor mood and adjust medications if needed  Vitamin D  deficiency Vitamin D  level was 9 in July. Completed a 12-week course of supplementation. Currently on an over-the-counter dose daily. Rechecking vitamin D  levels to determine if further supplementation is needed. Discussed the importance of monitoring vitamin D  levels to avoid potential toxicity from over-supplementation. - Recheck vitamin D  levels at the upcoming appointment with [facility] - Do not refill vitamin D  until levels are rechecked  General Health Maintenance Due for a physical examination and routine blood work. Last physical was in July 2023. - Schedule a physical examination - Check blood work during the physical examination  Follow-up - Follow up with [facility] in two weeks for vitamin D  recheck.        No orders of the defined types were placed in this encounter.  No orders of the defined types were placed in this encounter.    I reviewed the patients updated PMH, FH, and SocHx.    Patient Active Problem List   Diagnosis Date Noted   OSA (obstructive sleep apnea) 09/10/2022    Priority: High   Prediabetes 09/10/2022    Priority: High   Gastric bypass status for obesity 07/21/2017  Priority: High   History of obstructive sleep apnea 08/08/2013    Priority: High   History of non-ST elevation myocardial infarction (NSTEMI) 07/20/2013    Priority: High   Morbid obesity with BMI of 50.0-59.9, adult (HCC) 06/14/2012    Priority: High   GAD (generalized anxiety disorder) 06/19/2021    Priority: Medium    PCOS (polycystic ovarian syndrome) 07/21/2017    Priority:  Medium    Suppurative hidradenitis 07/21/2017    Priority: Medium    Spondylosis of lumbar region without myelopathy or radiculopathy 07/21/2017    Priority: Medium    Primary osteoarthritis of both hips 07/21/2017    Priority: Medium    NSTEMI (non-ST elevated myocardial infarction), type 2 secondary to demand ischemia sue to sepsis 07/20/2013    Priority: Medium    History of ARDS 07/18/2013    Priority: Medium    Diastolic dysfunction without heart failure 07/18/2013    Priority: Medium    Asthma, mild intermittent 03/11/2013    Priority: Medium    Migraine 06/14/2012    Priority: Medium    Iron deficiency 08/23/2021    Priority: Low   Vitamin D  deficiency 08/23/2021    Priority: Low   Seasonal allergic rhinitis due to pollen 06/21/2018    Priority: Low   RBBB (right bundle branch block) 09/10/2022   No outpatient medications have been marked as taking for the 03/04/23 encounter (Office Visit) with Luevenia Saha, MD.    Allergies: Patient is allergic to nsaids. Family History: Patient family history includes Alcohol  abuse in her father; Allergic rhinitis in her daughter; Alzheimer's disease in her maternal grandmother; COPD in her maternal grandfather; Cancer in her maternal grandfather, paternal grandfather, and paternal grandmother; Cerebral aneurysm in her paternal grandmother; Diabetes in her maternal grandfather and mother; Early death in her father and paternal grandmother; Healthy in her brother; Heart attack in her maternal grandmother; Miscarriages / [place] in her mother; Sarcoidosis in her father; Stroke in her mother. Social History:  Patient  reports that she has never smoked. She has never used smokeless tobacco. She reports current alcohol  use. She reports that she does not use drugs.  Review of Systems: Constitutional: Negative for fever malaise or anorexia Cardiovascular: negative for chest pain Respiratory: negative for SOB or persistent  cough Gastrointestinal: negative for abdominal pain  Objective  Vitals: BP 124/88   Pulse 90   Temp 97.7 F (36.5 C)   Ht 5\' 6"  (1.676 m)   Wt (!) 355 lb 9.6 oz (161.3 kg)   LMP 07/22/2019   SpO2 98%   BMI 57.40 kg/m  General: no acute distress , A&Ox3 Skin:  Warm, no rashes  Commons side effects, risks, benefits, and alternatives for medications and treatment plan prescribed today were discussed, and the patient expressed understanding of the given instructions. Patient is instructed to call or message via MyChart if he/she has any questions or concerns regarding our treatment plan. No barriers to understanding were identified. We discussed Red Flag symptoms and signs in detail. Patient expressed understanding regarding what to do in case of urgent or emergency type symptoms.  Medication list was reconciled, printed and provided to the patient in AVS. Patient instructions and summary information was reviewed with the patient as documented in the AVS. This note was prepared with assistance of Dragon voice recognition software. Occasional wrong-word or sound-a-like substitutions may have occurred due to the inherent limitations of voice recognition software

## 2023-03-04 NOTE — Patient Instructions (Addendum)
 Please return for your annual complete physical; please come fasting.  If you have any questions or concerns, please don't hesitate to send me a message via MyChart or call the office at [phone]. Thank you for visiting with us  today! It's our pleasure caring for you.   VISIT SUMMARY:  During today's visit, we discussed your recent onset of generalized itching, which you believe is related to your nightly dose of Sertraline . We also reviewed your history of low Vitamin D  levels and your upcoming follow-up appointment. Additionally, we talked about the need for a physical examination and routine blood work.  YOUR PLAN:  -PRURITUS, possible SERTRALINE -INDUCED : Sertraline -induced pruritus means that the itching you are experiencing may be caused by your Sertraline  medication. To address this, we will gradually reduce your Sertraline  dose over two weeks: take it every other day for the first week, then twice a week (Monday and Thursday) for the second week, and then stop. You can use Benadryl  on the nights you take Sertraline  to help with the itching. Please monitor for any rash or swelling, and if these occur, stop taking Sertraline  immediately. Additionally, use a good moisturizer to help with potential dry skin.  -VITAMIN D  DEFICIENCY: Vitamin D  deficiency means that your body has low levels of Vitamin D , which is important for bone health and overall well-being. Your Vitamin D  level was very low in July, and you completed a 12-week course of supplementation. You are currently taking an over-the-counter dose daily. We will recheck your Vitamin D  levels at your upcoming appointment to determine if you need further supplementation. Please do not refill your Vitamin D  until we have rechecked your levels.  -GENERAL HEALTH MAINTENANCE: You are due for a physical examination and routine blood work, as your last physical was in July 2023. It is important to schedule a physical examination and have your  blood work checked during this appointment to ensure your overall health is monitored.  INSTRUCTIONS:  Please follow up with [facility] in two weeks for your Vitamin D  recheck. Additionally, schedule a physical examination and have your routine blood work done during this appointment.

## 2023-03-09 ENCOUNTER — Institutional Professional Consult (permissible substitution): Payer: 59 | Admitting: Neurology

## 2023-03-09 ENCOUNTER — Encounter: Payer: Self-pay | Admitting: Family Medicine

## 2023-03-19 ENCOUNTER — Institutional Professional Consult (permissible substitution): Payer: 59 | Admitting: Neurology

## 2023-03-19 ENCOUNTER — Encounter: Payer: Self-pay | Admitting: Neurology

## 2023-03-23 ENCOUNTER — Ambulatory Visit (INDEPENDENT_AMBULATORY_CARE_PROVIDER_SITE_OTHER): Payer: 59 | Admitting: Internal Medicine

## 2023-04-06 ENCOUNTER — Other Ambulatory Visit: Payer: Self-pay | Admitting: Family Medicine

## 2023-04-06 ENCOUNTER — Other Ambulatory Visit (INDEPENDENT_AMBULATORY_CARE_PROVIDER_SITE_OTHER): Payer: Self-pay | Admitting: Internal Medicine

## 2023-04-06 DIAGNOSIS — E559 Vitamin D deficiency, unspecified: Secondary | ICD-10-CM

## 2023-04-06 NOTE — Telephone Encounter (Signed)
03/04/2023 LOV  10/01/2022 Fill date  30/0 refills

## 2023-04-07 ENCOUNTER — Encounter: Payer: Self-pay | Admitting: Family Medicine

## 2023-04-07 ENCOUNTER — Encounter: Payer: Self-pay | Admitting: Neurology

## 2023-04-07 ENCOUNTER — Ambulatory Visit: Payer: 59 | Admitting: Neurology

## 2023-04-07 VITALS — BP 146/92 | HR 75 | Ht 65.0 in | Wt 355.0 lb

## 2023-04-07 DIAGNOSIS — G4733 Obstructive sleep apnea (adult) (pediatric): Secondary | ICD-10-CM | POA: Diagnosis not present

## 2023-04-07 DIAGNOSIS — E662 Morbid (severe) obesity with alveolar hypoventilation: Secondary | ICD-10-CM

## 2023-04-07 DIAGNOSIS — E559 Vitamin D deficiency, unspecified: Secondary | ICD-10-CM

## 2023-04-07 DIAGNOSIS — I214 Non-ST elevation (NSTEMI) myocardial infarction: Secondary | ICD-10-CM | POA: Diagnosis not present

## 2023-04-07 DIAGNOSIS — E66813 Obesity, class 3: Secondary | ICD-10-CM | POA: Diagnosis not present

## 2023-04-07 DIAGNOSIS — I5189 Other ill-defined heart diseases: Secondary | ICD-10-CM

## 2023-04-07 DIAGNOSIS — E611 Iron deficiency: Secondary | ICD-10-CM

## 2023-04-07 DIAGNOSIS — Z6841 Body Mass Index (BMI) 40.0 and over, adult: Secondary | ICD-10-CM

## 2023-04-07 DIAGNOSIS — Z9884 Bariatric surgery status: Secondary | ICD-10-CM

## 2023-04-07 MED ORDER — ALPRAZOLAM 0.5 MG PO TABS: 0.5000 mg | ORAL_TABLET | Freq: Every day | ORAL | 0 refills | Status: DC | PRN

## 2023-04-07 NOTE — Patient Instructions (Signed)
Obesity-Hypoventilation Syndrome Obesity-hypoventilation syndrome (OHS) is a condition in which a person cannot efficiently move air in and out of the lungs (ventilate). This condition causes a buildup of carbon dioxidelevels in the blood and a drop in oxygen levels. OHS can increase the risk for: Cor pulmonale, or right-sided heart failure. Left-sided heart failure. Pulmonary hypertension, or high blood pressure in the arteries in the lungs. Too many red blood cells in the body. Disability or death. What are the causes? This condition may be caused by: Being obese with a BMI (body mass index) greater than or equal to 30 kg/m2. Having too much fat around the abdomen, chest, and neck. The brain being unable to properly manage the high carbon dioxide and low oxygen levels. Hormones made by fat cells. These hormones may interfere with breathing. Sleep apnea. This is when breathing stops, pauses, or is shallow during sleep. What are the signs or symptoms? Symptoms of this condition include: Feeling sleepy during the day. Headaches. These may be worse in the morning. Shortness of breath. Snoring, choking, gasping, or trouble breathing during sleep. Poor concentration or poor memory. Mood changes or feeling irritable. Depression. How is this diagnosed? This condition may be diagnosed by: BMI measurement. Blood tests to measure blood levels of serum bicarbonate, carbon dioxide, and oxygen. Pulse oximetry to measure the amount of oxygen in your blood. This uses a small device that is placed on your finger, earlobe, or toe. Polysomnogram, or sleep study, to check your breathing patterns and levels of oxygen and carbon dioxide while you sleep. You may also have other tests, including: A chest X-ray to rule out other breathing problems. Lung tests, or pulmonary function tests, to rule out other breathing problems. Electrocardiogram (ECG) or echocardiogram to check for signs of heart  failure. How is this treated? This condition may be treated with: A device such as a continuous positive airway pressure (CPAP) machine or a bi-level positive airway pressure (BIPAP) machine. These devices deliver pressure and sometimes oxygen to make breathing easier. A mask may be placed over your nose or mouth. Oxygen if your blood oxygen levels are low. A weight-loss program. Bariatric, or weight-loss, surgery. Tracheostomy. A tube is placed in the windpipe through the neck to help with breathing. Follow these instructions at home:  Medicines Take over-the-counter and prescription medicines only as told by your health care provider. Ask your health care provider what medicines are safe for you. You may be told to avoid medicines such as sedatives and narcotics. These can affect breathing and make OHS worse. Sleeping habits If you are prescribed a CPAP or a BIPAP machine, make sure you understand how to use it. Use your CPAP or BIPAP machine only as told by your health care provider. Try to get at least 8 hours of sleep every night. Eating and drinking  Eat foods that are high in fiber, such as beans, whole grains, and fresh fruits and vegetables. Limit foods that are high in fat and processed sugars, such as fried or sweet foods. Drink enough fluid to keep your urine pale yellow. Do not drink alcohol if: Your health care provider tells you not to drink. You are pregnant, may be pregnant, or are planning to become pregnant. General instructions Follow a diet and exercise plan that helps you reach and keep a healthy weight as told by your health care provider. Exercise regularly as told by your health care provider. Do not use any products that contain nicotine or tobacco. These products  include cigarettes, chewing tobacco, and vaping devices, such as e-cigarettes. If you need help quitting, ask your health care provider. Keep all follow-up visits. This is important. Contact a health  care provider if: You develop new or worsening shortness of breath. You are having trouble waking up or staying awake. You are confused. You develop a cough. You have a fever. Get help right away if: You have chest pain. You have fast or irregular heartbeats. You are dizzy or you faint. You have any symptoms of a stroke. "BE FAST" is an easy way to remember the main warning signs of a stroke: B - Balance. Signs are dizziness, sudden trouble walking, or loss of balance. E - Eyes. Signs are trouble seeing or a sudden change in vision. F - Face. Signs are sudden weakness or numbness of the face, or the face or eyelid drooping on one side. A - Arms. Signs are weakness or numbness in an arm. This happens suddenly and usually on one side of the body. S - Speech. Signs are sudden trouble speaking, slurred speech, or trouble understanding what people say. T - Time. Time to call emergency services. Write down what time symptoms started. You have other signs of a stroke, such as: A sudden, severe headache with no known cause. Nausea or vomiting. Seizure. These symptoms may be an emergency. Get help right away. Call 911. Do not wait to see if the symptoms will go away. Do not drive yourself to the hospital. Summary Obesity-hypoventilation syndrome (OHS) causes a buildup of carbon dioxidelevels in the blood and a drop in oxygen levels. OHS can increase the risk for heart failure, pulmonary hypertension, disability, and death. Follow your diet and exercise plan as told by your health care provider. Get help right away if you have any symptoms of a stroke. This information is not intended to replace advice given to you by your health care provider. Make sure you discuss any questions you have with your health care provider. Document Revised: 09/11/2020 Document Reviewed: 09/11/2020 Elsevier Patient Education  2024 [employer]Living With Sleep Apnea Sleep apnea is a condition that affects your  breathing while you're sleeping. Your tongue or the tissue in your throat may block the flow of air while you sleep. You may have shallow breathing or stop breathing for short periods of time. The breaks in breathing interrupt the deep sleep that you need to feel rested. Even if you don't wake up from the gaps in breathing, you may feel tired during the day. People with sleep apnea may snore loudly. You may have a headache in the morning and feel anxious or depressed. How can sleep apnea affect me? Sleep apnea increases your chances of being very tired during the day. This is called daytime fatigue. Sleep apnea can also increase your risk of: Heart attack. Stroke. Obesity. Type 2 diabetes. Heart failure. Irregular heartbeat. High blood pressure. If you are very tired during the day, you may be more likely to: Not do well in school or at work. Fall asleep while driving. Have trouble paying attention. Develop depression or anxiety. Have problems having sex. This is called sexual dysfunction. What actions can I take to manage sleep apnea? Sleep apnea treatment  If you were given a device to open your airway while you sleep, use it only as told by your health care provider. You may be given: An oral appliance. This is a mouthpiece that shifts your lower jaw forward. A continuous positive airway pressure (CPAP) device.  This blows air through a mask. A nasal expiratory positive airway pressure (EPAP) device. This has valves that you put into each nostril. A bi-level positive airway pressure (BIPAP) device. This blows air through a mask when you breathe in and breathe out. You may need surgery if other treatments don't work for you. Sleep habits Go to sleep and wake up at the same time every day. This helps set your internal clock for sleeping. If you stay up later than usual on weekends, try to get up in the morning within 2 hours of the time you usually wake up. Try to get at least 7-9 hours  of sleep each night. Stop using a computer, tablet, and mobile phone a few hours before bedtime. Do not take long naps during the day. If you nap, limit it to 30 minutes. Have a relaxing bedtime routine. Reading or listening to music may relax you and help you sleep. Use your bedroom only for sleep. Keep your television and computer out of your bedroom. Keep your bedroom cool, dark, and quiet. Use a supportive mattress and pillows. Follow your provider's instructions for other changes to sleep habits. Nutrition Do not eat big meals in the evening. Do not have caffeine in the later part of the day. The effects of caffeine can last for more than 5 hours. Follow your provider's instructions for any changes to what you eat and drink. Lifestyle Do not drink alcohol before bedtime. Alcohol can cause you to fall asleep at first, but then it can cause you to wake up in the middle of the night and have trouble getting back to sleep. Do not smoke, vape, or use nicotine or tobacco. Medicines Take over-the-counter and prescription medicines only as told by your provider. Do not use over-the-counter sleep medicine. You may become dependent on this medicine, and it can make sleep apnea worse. Do not take medicines, such as sedatives and narcotics, unless told to by your provider. Activity Exercise on most days, but avoid exercising in the evening. Exercising near bedtime can interfere with sleeping. If possible, spend time outside every day. Natural light helps with your internal clock. General information Lose weight if you need to. Stay at a healthy weight. If you are having surgery, make sure to tell your provider that you have sleep apnea. You may need to bring your device with you. Keep all follow-up visits. Your provider will want to check on your condition. Where to find more information National [employer], Lung, and [employer] Institute: [employer] This information is not intended to replace advice  given to you by your health care provider. Make sure you discuss any questions you have with your health care provider. Document Revised: 05/28/2022 Document Reviewed: 05/28/2022 Elsevier Patient Education  2024 [employer].

## 2023-04-07 NOTE — Progress Notes (Signed)
SLEEP MEDICINE CLINIC    Provider:  Melvyn Novas, MD  Primary Care Physician:  Willow Ora, MD [address] [place] [address]     Referring Provider: Worthy Rancher, [place] [address] [place],  [place] [address]          Chief Complaint according to patient   Patient presents with:     New Patient (Initial Visit)           HISTORY OF PRESENT ILLNESS:  Melissa Erickson is a 43 y.o. female patient who is seen upon referral on 04/07/2023 from Dr Worthy Rancher, MD  for a sleep consultation.  In 2013 she had been dx with OSA and  started CPAP,she underwent gastric bypass surgery  in 2015  but had complications. She lost 70 pounds but gained  it back.  After surgery she had developed respiratory failure  and was intubated and had to go to rehab.  Her weight and wellness team placed her on Zebound, but her insurance wouldn't cover.   Here to help  documentation for pre-authorization,  needing the dx of OSA revisited.     Chief concern according to patient :  " never do I feel refreshed and restored, I wake up and want to go back  to sleep"   I doze off in PM classes, and I have slept at the stop lights. I have the pleasure of seeing Melissa Erickson 04/07/23 a right -handed female with a possible sleep disorder.   The patient had the first sleep study in the year 2013 in [place].    Sleep relevant medical history: Nocturia  one time,  Enuresis  10 days ago.  Sinus cyst surgery-    Family medical /sleep history: No other family member on CPAP with OSA.  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  The patient currently works  in daytime .  Tobacco use; none .  ETOH use ; socially, less than 1 drink a months ,  Caffeine intake in form of Coffee( 1-2 cups in AM ) Soda( pe[si 1 can in PM ) Tea ( dinner) no energy drinks Exercise in form of = too fatigued.       Sleep habits  are as follows: The patient's dinner time is between 5-6 PM. The patient goes to bed at 9-10 PM and continues to sleep for just 2 hours and agin back to sleep for another 2 hours, total  5 hours, wakes for 1 bathroom break, the first time at 2 AM.   The preferred sleep position is side/ prone , with the support of 2 pillows. Dreams are reportedly rare.   The patient wakes up spontaneously/ 6.30 with an alarm. 6.30  AM is the usual rise time. She reports not feeling refreshed or restored in AM, with symptoms such as dry mouth, morning headaches, and residual fatigue. Naps are taken frequently, when ever possible - every weekend 3-4 hours and are more refreshing than nocturnal sleep.    Review of Systems: Out of a complete 14 system review, the patient complains of only the following symptoms, and all other reviewed systems are negative.:  Fatigue, sleepiness , snoring, fragmented sleep, Insomnia, Nocturia    How likely are you to doze in the following situations: 0 = not likely, 1 = slight chance, 2 = moderate chance, 3 =  high chance   Sitting and Reading? Watching Television? Sitting inactive in a public place (theater or meeting)? As a passenger in a car for an hour without a break? Lying down in the afternoon when circumstances permit? Sitting and talking to someone? Sitting quietly after lunch without alcohol? In a car, while stopped for a few minutes in traffic?   Total = 12/ 24 points   FSS endorsed at 30/ 63 points.     Social History   Socioeconomic History   Marital status: Single    Spouse name: Not on file   Number of children: One daughter, born  2007    Years of education: Masters degree    Highest education level: Not on file  Occupational History   Occupation: Corporate investment banker, call center positions    Employer: [employer]    Comment: Librarian, academic   Occupation: Runner, broadcasting/film/video  Tobacco Use   Smoking status: Never   Smokeless tobacco: Never  Vaping Use   Vaping status:  Never Used  Substance and Sexual Activity   Alcohol use: Yes    Comment: occ   Drug use: Never   Sexual activity: Yes    Birth control/protection: None  Other Topics Concern   Not on file  Social History Narrative   ** Merged History Encounter **       Lives with long term boyfriend and daughter.   Social Drivers of Corporate investment banker Strain: Not on file  Food Insecurity: No Food Insecurity (08/03/2020)   Received from [facility], [facility]   Hunger Vital Sign    Worried About Running Out of Food in the Last Year: Never true    Ran Out of Food in the Last Year: Never true  Transportation Needs: Not on file  Physical Activity: Not on file  Stress: Not on file  Social Connections: Unknown (06/19/2021)   Received from [facility], [facility]   Social Network    Social Network: Not on file    Family History  Problem Relation Age of Onset   Diabetes Mother    Stroke Mother    Miscarriages / [place] Mother    Sarcoidosis Father    Alcohol abuse Father    Early death Father    Healthy Brother    Alzheimer's disease Maternal Grandmother    Heart attack Maternal Grandmother    Diabetes Maternal Grandfather    COPD Maternal Grandfather    Cancer Maternal Grandfather    Cancer Paternal Grandmother    Cerebral aneurysm Paternal Grandmother    Early death Paternal Grandmother    Cancer Paternal Grandfather    Allergic rhinitis Daughter     Past Medical History:  Diagnosis Date   Allergy    Asthma    Back pain    Chicken pox    Edema of both lower legs    Environmental allergies    Seasonal   Essential hypertension 07/18/2013   Gallstones    Heart attack (HCC)    NSTEMI 07/2013, demand ischemia in setting of ARDS, post-gastric sleeve    History of congestive heart failure 2015   History of pneumonia    Joint pain    Leiomyoma of uterus, unspecified 07/26/2019   Migraine    Polycystic ovarian syndrome    Sleep apnea    SOB (shortness of  breath)    Suppurative hidradenitis 07/21/2017   Ventral hernia    Vitamin D deficiency     Past Surgical History:  Procedure Laterality  Date   BREAST BIOPSY Right 2001   Normal   CESAREAN SECTION  2007   CHOLECYSTECTOMY     ESOPHAGOGASTRODUODENOSCOPY N/A 07/19/2013   Procedure: ESOPHAGOGASTRODUODENOSCOPY (EGD);  Surgeon: Shelly Rubenstein, MD;  Location: [facility] OR;  Service: General;  Laterality: N/A;   HYSTERECTOMY ABDOMINAL WITH SALPINGECTOMY N/A 07/26/2019   Procedure: Hysterectomy Abdominal With Salpingectomy;  Surgeon: Essie Hart, MD;  Location: [facility] OR;  Service: Gynecology;  Laterality: N/A;   LAPAROSCOPIC GASTRIC SLEEVE RESECTION     LAPAROSCOPY N/A 07/19/2013   Procedure: LAPAROSCOPY DIAGNOSTIC;  Surgeon: Shelly Rubenstein, MD;  Location: [facility] OR;  Service: General;  Laterality: N/A;   TOTAL LAPAROSCOPIC HYSTERECTOMY WITH SALPINGECTOMY Bilateral 07/26/2019   Procedure: Attempted TOTAL LAPAROSCOPIC HYSTERECTOMY WITH SALPINGECTOMY;  Surgeon: Essie Hart, MD;  Location: [facility] OR;  Service: Gynecology;  Laterality: Bilateral;     Current Outpatient Medications on File Prior to Visit  Medication Sig Dispense Refill   albuterol (VENTOLIN HFA) 108 (90 Base) MCG/ACT inhaler Inhale 2 puffs into the lungs every 4 (four) hours as needed for wheezing or shortness of breath. 18 g 2   ALPRAZolam (XANAX) 0.5 MG tablet Take 1 tablet (0.5 mg total) by mouth daily as needed for anxiety. 30 tablet 0   metFORMIN (GLUCOPHAGE-XR) 500 MG 24 hr tablet Take 1 tablet (500 mg total) by mouth 2 (two) times daily with a meal. 60 tablet 0   Multiple Vitamins-Minerals (MULTIVITAMIN WITH MINERALS) tablet Take 1 tablet by mouth daily.     sertraline (ZOLOFT) 100 MG tablet Take 1 tablet (100 mg total) by mouth daily. 90 tablet 3   Vitamin D, Ergocalciferol, (DRISDOL) 1.25 MG (50000 UNIT) CAPS capsule Take 1 capsule (50,000 Units total) by mouth every 7 (seven) days. 16 capsule 0   No current facility-administered  medications on file prior to visit.    Allergies  Allergen Reactions   Nsaids     Gastric Bypass surgery      DIAGNOSTIC DATA (LABS, IMAGING, TESTING) - I reviewed patient records, labs, notes, testing and imaging myself where available.  Lab Results  Component Value Date   WBC 5.7 09/10/2022   HGB 13.3 09/10/2022   HCT 41.9 09/10/2022   MCV 84 09/10/2022   PLT 200 09/10/2022      Component Value Date/Time   NA 141 09/10/2022 0857   K 4.4 09/10/2022 0857   CL 104 09/10/2022 0857   CO2 26 09/10/2022 0857   GLUCOSE 95 09/10/2022 0857   GLUCOSE 140 (H) 10/27/2021 0854   BUN 11 09/10/2022 0857   CREATININE 0.69 09/10/2022 0857   CALCIUM 9.2 09/10/2022 0857   PROT 6.7 09/10/2022 0857   ALBUMIN 3.7 (L) 09/10/2022 0857   AST 10 09/10/2022 0857   ALT 12 09/10/2022 0857   ALKPHOS 77 09/10/2022 0857   BILITOT 0.3 09/10/2022 0857   GFRNONAA >60 10/27/2021 0854   GFRAA >60 07/26/2019 0714   Lab Results  Component Value Date   CHOL 209 (H) 09/10/2022   HDL 44 09/10/2022   LDLCALC 143 (H) 09/10/2022   TRIG 122 09/10/2022   CHOLHDL 5 06/19/2021   Lab Results  Component Value Date   HGBA1C 6.0 (H) 09/10/2022   Lab Results  Component Value Date   VITAMINB12 828 09/10/2022   Lab Results  Component Value Date   TSH 1.680 09/10/2022    PHYSICAL EXAM:  Today's Vitals   04/07/23 1524  BP: (!) 146/92  Pulse: 75  Weight: (!) 355 lb (161  kg)  Height: 5\' 5"  (1.651 m)   Body mass index is 59.08 kg/m.   Wt Readings from Last 3 Encounters:  04/07/23 (!) 355 lb (161 kg)  03/04/23 (!) 355 lb 9.6 oz (161.3 kg)  02/17/23 (!) 355 lb (161 kg)     Ht Readings from Last 3 Encounters:  04/07/23 5\' 5"  (1.651 m)  03/04/23 5\' 6"  (1.676 m)  02/17/23 5\' 6"  (1.676 m)      General: The patient is awake, alert and appears not in acute distress. The patient is well groomed. Head: Normocephalic, atraumatic. Neck is supple.  Mallampati  3 plus, or 42,  neck circumference:21  inches .  Nasal airflow fully  patent.   Retrognathia is not seen.  Dental status: biological teeth  Cardiovascular:  Regular rate and cardiac rhythm by pulse,  without distended neck veins. Respiratory: Lungs are clear to auscultation.  Skin:  With evidence of  mild ankle edema, no rash. Trunk: The patient's posture is erect.   NEUROLOGIC EXAM: The patient is awake and alert, oriented to place and time.   Memory subjective described as intact.  Attention span & concentration ability appears normal.  Speech is fluent,  without  dysarthria, dysphonia or aphasia.  Mood and affect are appropriate.   Cranial nerves: no loss of smell or taste reported  Pupils are equal and briskly reactive to light. Funduscopic exam def.  Extraocular movements in vertical and horizontal planes were intact and without nystagmus. No Diplopia. Visual fields by finger perimetry are intact. Hearing was intact to soft voice and finger rubbing.    Facial sensation intact to fine touch.  Facial motor strength is symmetric and tongue and uvula move midline.  Neck ROM : rotation, tilt and flexion extension were normal for age and shoulder shrug was symmetrical.    Motor exam:  Symmetric bulk, tone and ROM.   Normal tone without cog wheeling, symmetric grip strength .   Sensory:  Fine touch, pinprick and vibration were tested  and  normal.  Proprioception tested in the upper extremities was normal.   Coordination: Rapid alternating movements in the fingers/hands were of normal speed.  The Finger-to-nose maneuver was intact without evidence of ataxia, dysmetria or tremor.   Gait and station: Patient could rise unassisted from a seated position, walked without assistive device.  Stance is of  wider base and the patient turned with 3 steps.  Toe and heel walk were deferred.  Deep tendon reflexes: in the  upper and lower extremities are symmetric and intact.  Babinski response was deferred .    ASSESSMENT AND  PLAN 43 y.o. year old female  here with:    1) history of OSA when she was at peak weight in 2013,  2 years later she underwent gastric bypass and lost 7-80 pounds , which she has now regained. Her iron and Vit D levels were critically low, lack of absorption.    2)  Weight and neck size and airway anatomy all pointing to high risk for apnea and hypoventilation ( OHV) and hypoxemia  . BMI is 59.    3)  Needing to get an in lab sleep study,  but [employer] will deny. I feel no other option as to order a HST for her.    HST ordered.   I plan to follow up either personally or through our NP within 4-6 months.   I would like to thank Willow Ora, MD and [place], [place] [address] [place],  [place] [address] for allowing me to meet with and to take care of this pleasant patient.     After spending a total time of  36  minutes face to face and additional time for physical and neurologic examination, review of laboratory studies,  personal review of imaging studies, reports and results of other testing and review of referral information / records as far as provided in visit,   Electronically signed by: Melvyn Novas, MD 04/07/2023 3:31 PM  [facility] and [facility] Sleep Board certified by The [employer] of Sleep Medicine and Diplomate of the [employer] of Sleep Medicine. Board certified In Neurology through the ABPN, Fellow of the [employer] of Neurology.

## 2023-04-08 ENCOUNTER — Encounter (INDEPENDENT_AMBULATORY_CARE_PROVIDER_SITE_OTHER): Payer: Self-pay | Admitting: Internal Medicine

## 2023-04-08 NOTE — Telephone Encounter (Signed)
Spoke with pt and requested Rx has been sent and pt has picked up

## 2023-04-15 ENCOUNTER — Encounter: Payer: Self-pay | Admitting: Family Medicine

## 2023-04-15 ENCOUNTER — Ambulatory Visit (INDEPENDENT_AMBULATORY_CARE_PROVIDER_SITE_OTHER): Payer: 59 | Admitting: Family Medicine

## 2023-04-15 VITALS — BP 138/88 | HR 80 | Temp 97.7°F | Ht 65.0 in | Wt 354.0 lb

## 2023-04-15 DIAGNOSIS — R7303 Prediabetes: Secondary | ICD-10-CM

## 2023-04-15 DIAGNOSIS — E559 Vitamin D deficiency, unspecified: Secondary | ICD-10-CM | POA: Diagnosis not present

## 2023-04-15 DIAGNOSIS — R030 Elevated blood-pressure reading, without diagnosis of hypertension: Secondary | ICD-10-CM | POA: Diagnosis not present

## 2023-04-15 DIAGNOSIS — Z6841 Body Mass Index (BMI) 40.0 and over, adult: Secondary | ICD-10-CM | POA: Diagnosis not present

## 2023-04-15 DIAGNOSIS — L853 Xerosis cutis: Secondary | ICD-10-CM | POA: Diagnosis not present

## 2023-04-15 DIAGNOSIS — F411 Generalized anxiety disorder: Secondary | ICD-10-CM

## 2023-04-15 DIAGNOSIS — E611 Iron deficiency: Secondary | ICD-10-CM

## 2023-04-15 DIAGNOSIS — Z0001 Encounter for general adult medical examination with abnormal findings: Secondary | ICD-10-CM

## 2023-04-15 DIAGNOSIS — Z9884 Bariatric surgery status: Secondary | ICD-10-CM | POA: Diagnosis not present

## 2023-04-15 LAB — COMPREHENSIVE METABOLIC PANEL
ALT: 14 U/L (ref 0–35)
AST: 16 U/L (ref 0–37)
Albumin: 3.7 g/dL (ref 3.5–5.2)
Alkaline Phosphatase: 64 U/L (ref 39–117)
BUN: 10 mg/dL (ref 6–23)
CO2: 26 meq/L (ref 19–32)
Calcium: 8.9 mg/dL (ref 8.4–10.5)
Chloride: 103 meq/L (ref 96–112)
Creatinine, Ser: 0.75 mg/dL (ref 0.40–1.20)
GFR: 96.93 mL/min (ref >60.00)
Glucose, Bld: 109 mg/dL — ABNORMAL HIGH (ref 70–99)
Potassium: 3.8 meq/L (ref 3.5–5.1)
Sodium: 137 meq/L (ref 135–145)
Total Bilirubin: 0.4 mg/dL (ref 0.2–1.2)
Total Protein: 7.7 g/dL (ref 6.0–8.3)

## 2023-04-15 LAB — LIPID PANEL
Cholesterol: 206 mg/dL — ABNORMAL HIGH (ref 0–200)
HDL: 38.5 mg/dL — ABNORMAL LOW (ref >39.00)
LDL Cholesterol: 144 mg/dL — ABNORMAL HIGH (ref 0–99)
NonHDL: 167.38
Total CHOL/HDL Ratio: 5
Triglycerides: 117 mg/dL (ref 0.0–149.0)
VLDL: 23.4 mg/dL (ref 0.0–40.0)

## 2023-04-15 LAB — CBC WITH DIFFERENTIAL/PLATELET
Basophils Absolute: 0 10*3/uL (ref 0.0–0.1)
Basophils Relative: 0.5 % (ref 0.0–3.0)
Eosinophils Absolute: 0.1 10*3/uL (ref 0.0–0.7)
Eosinophils Relative: 1.1 % (ref 0.0–5.0)
HCT: 40.7 % (ref 36.0–46.0)
Hemoglobin: 13.1 g/dL (ref 12.0–15.0)
Lymphocytes Relative: 26 % (ref 12.0–46.0)
Lymphs Abs: 1.9 10*3/uL (ref 0.7–4.0)
MCHC: 32.3 g/dL (ref 30.0–36.0)
MCV: 84.2 fL (ref 78.0–100.0)
Monocytes Absolute: 0.4 10*3/uL (ref 0.1–1.0)
Monocytes Relative: 6 % (ref 3.0–12.0)
Neutro Abs: 4.8 10*3/uL (ref 1.4–7.7)
Neutrophils Relative %: 66.4 % (ref 43.0–77.0)
Platelets: 220 10*3/uL (ref 150.0–400.0)
RBC: 4.84 Mil/uL (ref 3.87–5.11)
RDW: 14.2 % (ref 11.5–15.5)
WBC: 7.2 10*3/uL (ref 4.0–10.5)

## 2023-04-15 LAB — VITAMIN D 25 HYDROXY (VIT D DEFICIENCY, FRACTURES): VITD: 14.57 ng/mL — ABNORMAL LOW (ref 30.00–100.00)

## 2023-04-15 LAB — HEMOGLOBIN A1C: Hgb A1c MFr Bld: 5.9 % (ref 4.6–6.5)

## 2023-04-15 LAB — TSH: TSH: 1.02 u[IU]/mL (ref 0.35–5.50)

## 2023-04-15 MED ORDER — ALBUTEROL SULFATE HFA 108 (90 BASE) MCG/ACT IN AERS: 2.0000 | INHALATION_SPRAY | RESPIRATORY_TRACT | 2 refills | Status: DC | PRN

## 2023-04-15 NOTE — Progress Notes (Signed)
 Subjective  Chief Complaint  Patient presents with   Annual Exam    Pt here for Annual Exam and is not currently fasting    Obesity    HPI: Melissa Erickson is a 43 y.o. female who presents to [facility] today for a Female Wellness Visit. She also has the concerns and/or needs as listed above in the chief complaint. These will be addressed in addition to the Health Maintenance Visit.   Wellness Visit: annual visit with health maintenance review and exam  HM: due mammogram. Reviewed healthy weight and wellness notes. Progressing satisfactorily. She reports she had the flu and pneumonia shot last fall at [facility] pharmacy: need to clarify which vaccines she received. May be eligible for prevnar 20. Other imms current. Next year to start CRC screens.  Chronic disease f/u and/or acute problem visit: (deemed necessary to be done in addition to the wellness visit): Discussed the use of AI scribe software for clinical note transcription with the patient, who gave verbal consent to proceed.  History of Present Illness   Melissa Erickson is a 43 year old female who presents for a follow-up visit.  She experiences itching, initially suspecting it might be related to sertraline use, but the itching resolved without discontinuing the medication. She suspects food allergies, noting oral discomfort with certain foods like pineapple and citrus fruits.  She has a history of asthma but has not used her inhaler in a long time. No wheezing, tightness, or shortness of breath currently. Her inhaler is likely expired, and she plans to have it refilled for precautionary purposes, especially with the upcoming summer season.  She is undergoing evaluation for sleep issues and has completed an initial sleep study. She is awaiting further instructions from her insurance company to proceed with the study. She mentions low energy levels, suspecting a possible vitamin D deficiency.  She  reports extremely dry skin, trying various lotions without success. She showers daily with hot water, which may contribute to the dryness.  She is currently taking metformin for weight mgt and pcos and needs a refill. 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  She received a pneumonia shot this year and is unsure if she needs another dose. She plans to verify the type of vaccine received with the pharmacy.       Assessment  1. Encounter for well adult exam with abnormal findings   2. Gastric bypass status for obesity   3. Morbid obesity with BMI of 50.0-59.9, adult (HCC)   4. Prediabetes   5. GAD (generalized anxiety disorder)   6. Iron deficiency   7. Vitamin D deficiency   8. Xerosis cutis   9. Prehypertension      Plan  Female Wellness Visit: Age appropriate Health Maintenance and Prevention measures were discussed with patient. Included topics are cancer screening recommendations, ways to keep healthy (see AVS) including dietary and exercise recommendations, regular eye and dental care, use of seat belts, and avoidance of moderate alcohol use and tobacco use.  BMI: discussed patient's BMI and encouraged positive lifestyle modifications to help get to or maintain a target BMI. HM needs and immunizations were addressed and ordered. See below for orders. See HM and immunization section for updates. Routine labs and screening tests ordered including cmp, cbc and lipids where appropriate. Discussed recommendations regarding  Vit D and calcium supplementation (see AVS)  Chronic disease management visit and/or acute problem visit: Assessment and Plan    Allergic Reaction Recent itching resolved without intervention. Patient suspects food allergies. Currently taking Zyrtec nightly. -start Zyrtec nightly. -Consider referral to  allergist if symptoms persist or worsen.  Asthma No recent symptoms or need for inhaler use. Inhaler likely expired. -Refill generic inhaler for as-needed use.  Obstructive Sleep Apnea Undergoing re-evaluation for symptoms. -Await results of sleep study.  Prediabetes and PCOS On Metformin per healthy weight and wellness -check a1c  Prehypertension Blood pressure slightly elevated but not meeting criteria for treatment. -Monitor blood pressure closely. - low salt diet and weight loss recommended  Dry Skin Severe dry skin, possibly due to bathing practices and winter weather. -Recommend using Cetaphil or Lubriderm lotion twice daily. -Advise cooler, less frequent showers.  General Health Maintenance -Check blood work including iron levels, vitamin D, kidney and liver function, cholesterol, and glucose. -Continue with mammogram screenings. -Check with pharmacy regarding recent pneumonia vaccination. -Refill Xanax prescription for as-needed use for work-related stress.  Herpes Simplex Virus Positive antibody test in the past but no symptoms of active infection. -No treatment needed at this time.       Follow up: 12 mo for cpe  Orders Placed This Encounter  Procedures   CBC with Differential/Platelet   Comprehensive metabolic panel   Lipid panel   Hemoglobin A1c   TSH   VITAMIN D 25 Hydroxy (Vit-D Deficiency, Fractures)   Iron, TIBC and Ferritin Panel   No orders of the defined types were placed in this encounter.     Body mass index is 58.91 kg/m. Wt Readings from Last 3 Encounters:  04/15/23 (!) 354 lb (160.6 kg)  04/07/23 (!) 355 lb (161 kg)  03/04/23 (!) 355 lb 9.6 oz (161.3 kg)     Patient Active Problem List   Diagnosis Date Noted Date Diagnosed   OSA (obstructive sleep apnea) 09/10/2022     Priority: High   Prediabetes 09/10/2022     Priority: High   Gastric bypass status for obesity 07/21/2017     Priority: High    Gastric Sleeve 2015, [facility]  [name]. Complicated with postoperative pneumonia Presurgical weight 426, nadir 310 at 12 months with gradual weight regain starting 2017.    History of obstructive sleep apnea 08/08/2013     Priority: High   History of non-ST elevation myocardial infarction (NSTEMI) 07/20/2013     Priority: High    Formatting of this note might be different from the original. Overview: Demand type due to Sepsis/ARDS, nonSTEMI - no further cards eval needed.    Morbid obesity with BMI of 50.0-59.9, adult (HCC) 06/14/2012     Priority: High    H/o gastric bypass; failed phentermine: no appetite suppression or weight loss 2023    GAD (generalized anxiety disorder) 06/19/2021     Priority: Medium     Zoloft 50 increased to 100 in 2023; thought had allergic reaction in 2025.Marland KitchenMarland KitchenMarland Kitchen weaned.     PCOS (polycystic ovarian syndrome) 07/21/2017     Priority: Medium    Suppurative hidradenitis 07/21/2017     Priority: Medium    Spondylosis of lumbar region without myelopathy or radiculopathy 07/21/2017     Priority: Medium    Primary osteoarthritis of both hips 07/21/2017     Priority: Medium    NSTEMI (non-ST elevated myocardial infarction), type 2 secondary to demand ischemia sue to sepsis 07/20/2013     Priority: Medium  History of ARDS 07/18/2013     Priority: Medium     ARDS requiring intubation after gastric sleeve bypass 06/2013     Diastolic dysfunction without heart failure 07/18/2013     Priority: Medium    Asthma, mild intermittent 03/11/2013     Priority: Medium     Started Advair 250/500 02/2013    Migraine 06/14/2012     Priority: Medium    Iron deficiency 08/23/2021     Priority: Low   Vitamin D deficiency 08/23/2021     Priority: Low   Seasonal allergic rhinitis due to pollen 06/21/2018     Priority: Low   RBBB (right bundle branch block) 09/10/2022    Health Maintenance  Topic Date Due   Pneumococcal Vaccine 43-2 Years old (2 of 2 - PCV) 04/01/2014   COVID-19 Vaccine (4 -  2024-25 season) 05/01/2023 (Originally 10/19/2022)   DTaP/Tdap/Td (2 - Td or Tdap) 09/13/2026   INFLUENZA VACCINE  Completed   Hepatitis C Screening  Completed   HIV Screening  Completed   HPV VACCINES  Aged Out   Immunization History  Administered Date(s) Administered   Influenza,inj,Quad PF,6+ Mos 01/21/2019, 11/25/2021, 02/01/2023   PFIZER Comirnaty(Gray Top)Covid-19 Tri-Sucrose Vaccine 04/15/2019, 05/07/2019, 11/28/2019   PPD Test 08/22/2018   Pneumococcal Polysaccharide-23 04/01/2013   Tdap 09/12/2016   We updated and reviewed the patient's past history in detail and it is documented below. Allergies: Patient is allergic to nsaids. Past Medical History Patient  has a past medical history of Allergy, Asthma, Back pain, Chicken pox, Edema of both lower legs, Environmental allergies, Essential hypertension (07/18/2013), Gallstones, Heart attack (HCC), History of congestive heart failure (2015), History of pneumonia, Joint pain, Leiomyoma of uterus, unspecified (07/26/2019), Migraine, Polycystic ovarian syndrome, Sleep apnea, SOB (shortness of breath), Suppurative hidradenitis (07/21/2017), Ventral hernia, and Vitamin D deficiency. Past Surgical History Patient  has a past surgical history that includes Cholecystectomy; Laparoscopic gastric sleeve resection; laparoscopy (N/A, 07/19/2013); Esophagogastroduodenoscopy (N/A, 07/19/2013); Cesarean section (2007); Breast biopsy (Right, 2001); Total laparoscopic hysterectomy with salpingectomy (Bilateral, 07/26/2019); and Hysterectomy abdominal with salpingectomy (N/A, 07/26/2019). Family History: Patient family history includes Alcohol abuse in her father; Allergic rhinitis in her daughter; Alzheimer's disease in her maternal grandmother; COPD in her maternal grandfather; Cancer in her maternal grandfather, paternal grandfather, and paternal grandmother; Cerebral aneurysm in her paternal grandmother; Diabetes in her maternal grandfather and mother; Early  death in her father and paternal grandmother; Healthy in her brother; Heart attack in her maternal grandmother; Miscarriages / [place] in her mother; Sarcoidosis in her father; Stroke in her mother. Social History:  Patient  reports that she has never smoked. She has never used smokeless tobacco. She reports current alcohol use. She reports that she does not use drugs.  Review of Systems: Constitutional: negative for fever or malaise Ophthalmic: negative for photophobia, double vision or loss of vision Cardiovascular: negative for chest pain, dyspnea on exertion, or new LE swelling Respiratory: negative for SOB or persistent cough Gastrointestinal: negative for abdominal pain, change in bowel habits or melena Genitourinary: negative for dysuria or gross hematuria, no abnormal uterine bleeding or disharge Musculoskeletal: negative for new gait disturbance or muscular weakness Integumentary: negative for new or persistent rashes, no breast lumps Neurological: negative for TIA or stroke symptoms Psychiatric: negative for SI or delusions Allergic/Immunologic: negative for hives  Patient Care Team    Relationship Specialty Notifications Start End  Willow Ora, MD PCP - General Family Medicine  07/21/17   Essie Hart, MD (  Inactive) Referring Physician Obstetrics and Gynecology  07/21/17     Objective  Vitals: BP 138/88   Pulse 80   Temp 97.7 F (36.5 C)   Ht 5\' 5"  (1.651 m)   Wt (!) 354 lb (160.6 kg)   LMP 07/22/2019   SpO2 98%   BMI 58.91 kg/m  General:  Well developed, well nourished, no acute distress  Psych:  Alert and orientedx3,normal mood and affect HEENT:  Normocephalic, atraumatic, non-icteric sclera,  supple neck without adenopathy, mass or thyromegaly Cardiovascular:  Normal S1, S2, RRR without gallop, rub or murmur Respiratory:  Good breath sounds bilaterally, CTAB with normal respiratory effort Gastrointestinal: normal bowel sounds, soft, non-tender, no noted masses.  No HSM MSK: extremities without edema, joints without erythema or swelling Neurologic:    Mental status is normal.  Gross motor and sensory exams are normal.  No tremor  Commons side effects, risks, benefits, and alternatives for medications and treatment plan prescribed today were discussed, and the patient expressed understanding of the given instructions. Patient is instructed to call or message via MyChart if he/she has any questions or concerns regarding our treatment plan. No barriers to understanding were identified. We discussed Red Flag symptoms and signs in detail. Patient expressed understanding regarding what to do in case of urgent or emergency type symptoms.  Medication list was reconciled, printed and provided to the patient in AVS. Patient instructions and summary information was reviewed with the patient as documented in the AVS. This note was prepared with assistance of Dragon voice recognition software. Occasional wrong-word or sound-a-like substitutions may have occurred due to the inherent limitations of voice recognition software

## 2023-04-15 NOTE — Patient Instructions (Signed)
 Please return in 12 months for your annual complete physical; please come fasting.   I will release your lab results to you on your MyChart account with further instructions. You may see the results before I do, but when I review them I will send you a message with my report or have my assistant call you if things need to be discussed. Please reply to my message with any questions. Thank you!   If you have any questions or concerns, please don't hesitate to send me a message via MyChart or call the office at [phone]. Thank you for visiting with Melissa Erickson today! It's our pleasure caring for you.   VISIT SUMMARY:  Today, we discussed several of your ongoing health concerns and made plans to address them. Your itching has resolved, but we will continue monitoring for potential food allergies. We also reviewed your asthma management, sleep issues, prediabetes, and dry skin. Additionally, we discussed your general health maintenance and vaccination status.  YOUR PLAN:  -ALLERGIC REACTION: You experienced itching that has since resolved, but you suspect food allergies. We will continue your nightly Zyrtec and consider referring you to an allergist if the symptoms return or worsen.  -ASTHMA: Asthma is a condition where your airways narrow and swell, making it difficult to breathe. Although you haven't had recent symptoms, we will refill your inhaler for precautionary use, especially with summer approaching.  -OBSTRUCTIVE SLEEP APNEA: Obstructive Sleep Apnea is a sleep disorder where breathing repeatedly stops and starts. You are currently undergoing re-evaluation and awaiting further instructions from your insurance company.  -PREDIABETES: Prediabetes means your blood sugar levels are higher than normal but not yet high enough to be diagnosed as diabetes. We will refill your Metformin prescription to help manage this condition.  -PREHYPERTENSION: Prehypertension is when your blood pressure is slightly  elevated but not high enough to require medication. We will monitor your blood pressure closely.  -DRY SKIN: Your extremely dry skin may be due to your bathing practices and the winter weather. We recommend using Cetaphil or Lubriderm lotion twice daily and taking cooler, less frequent showers.  -GENERAL HEALTH MAINTENANCE: We will check your blood work, including iron levels, vitamin D, kidney and liver function, cholesterol, and glucose. Continue with your mammogram screenings and verify your recent pneumonia vaccination with the pharmacy.  -HERPES SIMPLEX VIRUS: You have a past positive antibody test for Herpes Simplex Virus but no active symptoms. No treatment is needed at this time.  INSTRUCTIONS:  Please follow up with the pharmacy to verify the type of pneumonia vaccine you received. Await further instructions from your insurance company regarding your sleep study. Continue monitoring your blood pressure and use your inhaler as needed. Refill your Metformin and Xanax prescriptions as discussed.

## 2023-04-16 ENCOUNTER — Encounter: Payer: Self-pay | Admitting: Family Medicine

## 2023-04-16 LAB — IRON,TIBC AND FERRITIN PANEL
%SAT: 17 % (ref 16–45)
Ferritin: 22 ng/mL (ref 16–232)
Iron: 52 ug/dL (ref 40–190)
TIBC: 314 ug/dL (ref 250–450)

## 2023-04-16 MED ORDER — VITAMIN D (ERGOCALCIFEROL) 1.25 MG (50000 UNIT) PO CAPS: 50000.0000 [IU] | ORAL_CAPSULE | ORAL | 0 refills | Status: AC

## 2023-04-16 NOTE — Addendum Note (Signed)
 Addended by: Asencion Partridge on: 04/16/2023 02:41 PM   Modules accepted: Orders

## 2023-04-16 NOTE — Progress Notes (Signed)
 See mychart note Dear Ms. Noble, Your results look good overall. Your cholesterol is borderline; your diet should improve these numbers. We may consider adding cholesterol lowering medication in the future. I will monitor it for now. Your diabetes test results is now in range on the metformin.  Your iron levels are now in ranges as well.  However, your vitamin D remains very low.  I have reordered the weekly supplements to add to daily otc 2000 units. Please start these supplements to get this level up. Low levels can affect weight loss.  Sincerely, Dr. Mardelle Matte

## 2023-04-22 DIAGNOSIS — Z0279 Encounter for issue of other medical certificate: Secondary | ICD-10-CM

## 2023-05-01 ENCOUNTER — Ambulatory Visit: Admitting: Neurology

## 2023-05-01 DIAGNOSIS — G4733 Obstructive sleep apnea (adult) (pediatric): Secondary | ICD-10-CM | POA: Diagnosis not present

## 2023-05-01 DIAGNOSIS — E662 Morbid (severe) obesity with alveolar hypoventilation: Secondary | ICD-10-CM

## 2023-05-21 NOTE — Progress Notes (Signed)
 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  Her first sleep study in 2013 took place in [place] . Chief concern is that the patient does not get refreshing and restorative sleep and that she feels sleepy even in the morning right after waking up.     Epworth sleepiness score: 12/ 24 points   FSS endorsed at 30/ 63 points.  BMI:  59 kg/m Neck Circumference: 21"   FINDINGS: Sleep Summary:   Total Recording Time (hours, min):    8 hours 37 minutes   Total Sleep Time (hours, min): 4 hours 15 minutes effective sleep time Sleep efficiency %;    68%                                   Respiratory Indices by AASM : Calculated pAHI (per hour): 34.5/h                                               Positional respiratory activity  / snoring : Snoring was on and off present throughout the night with the highest volume in supine sleep.  Oxygen Saturation  in Sleep    Oxygen Saturation (%) Mean:   Mean oxygen saturation was 92%            O2 Saturation Range (%):        Ranging between a nadir at 69 and the maximal saturation of 99.5%                               O2 Saturation (minutes) <89%:     15 minutes      Pulse Rate in Sleep :   Pulse Mean (bpm): 82 bpm               Pulse Range:    Between 62 and 103 bpm in normal sinus rhythm.             IMPRESSION:  This HST confirms  the presence of a moderate degree of obstructive sleep apnea, reduced overall sleep efficiency and a moderate degree of sleep hypoxia. Snoring was detected and was mild to moderate with the highest volume while in supine sleep.  Treatment with positive airway pressure therapy is highly recommended.  The patient will be advised to proceed with an auto titration device at home.  This device will be ordered and the order sent to the durable medical equipment company that contracts with her health insurance.  A laboratory attended CPAP titration could be considered if the patient has trouble to tolerate CPAP, if the downloads on CPAP show central sleep apnea  arising or unexplained other insufficient control of obstructive sleep apnea.  Concomitant weight loss is recommended.  Please note, that untreated obstructive sleep apnea can carry additional perioperative morbidity and that patients with obstructive sleep apnea should receive perioperative positive airway pressure therapy.  It would be important to have surgeons and anesthesiologists made aware of the diagnosis.   RECOMMENDATION: The patient should be cautioned not to drive or work at heights or operate machinery or equipment when too tired or too sleepy to do so.  Review and reiteration of good sleep hygiene measures was part of the initial consultation, with unbiased material available online through the [employer] of health or the [facility] brochure:" Your guide to better sleep".   An order for a auto titration capable CPAP device has been issued.  A revisit will be offered within 30 to 90 days of CPAP therapy.    NTERPRETING PHYSICIAN:   Neomia Banner, MD  [facility] Sleep Board certified by The [employer] of Sleep Medicine and Diplomate of the [employer] of Sleep Medicine. Board certified In Neurology through the ABPN, Fellow of the [employer] of Neurology.

## 2023-05-25 ENCOUNTER — Encounter: Payer: Self-pay | Admitting: Neurology

## 2023-06-10 ENCOUNTER — Encounter: Payer: Self-pay | Admitting: Family Medicine

## 2023-06-11 ENCOUNTER — Telehealth: Payer: Self-pay

## 2023-06-11 NOTE — Telephone Encounter (Signed)
 Pt wanted to know if you are able to see her sleep study results and wanted a copy of it.

## 2023-06-12 ENCOUNTER — Encounter (INDEPENDENT_AMBULATORY_CARE_PROVIDER_SITE_OTHER): Payer: Self-pay | Admitting: Internal Medicine

## 2023-06-14 ENCOUNTER — Encounter: Payer: Self-pay | Admitting: Neurology

## 2023-06-14 ENCOUNTER — Other Ambulatory Visit: Payer: Self-pay | Admitting: Neurology

## 2023-06-14 DIAGNOSIS — G478 Other sleep disorders: Secondary | ICD-10-CM

## 2023-06-14 DIAGNOSIS — E66813 Obesity, class 3: Secondary | ICD-10-CM

## 2023-06-14 DIAGNOSIS — E662 Morbid (severe) obesity with alveolar hypoventilation: Secondary | ICD-10-CM

## 2023-06-14 DIAGNOSIS — G4733 Obstructive sleep apnea (adult) (pediatric): Secondary | ICD-10-CM

## 2023-06-14 NOTE — Procedures (Signed)
 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  Her first sleep study in 2013 took place in [place] . Chief concern is that the patient does not get refreshing and restorative sleep and that she feels sleepy even in the morning right after waking up.     Epworth sleepiness score: 12/ 24 points   FSS endorsed at 30/ 63 points.  BMI:  59 kg/m Neck Circumference: 21"   FINDINGS: Sleep Summary:   Total Recording Time (hours, min):    8 hours 37 minutes   Total Sleep Time (hours, min): 4 hours 15 minutes effective sleep time Sleep efficiency %;    68%                                   Respiratory Indices by AASM : Calculated pAHI (per hour): 34.5/h                                               Positional respiratory activity  / snoring : Snoring was on and off present throughout the night with the highest volume in supine sleep.  Oxygen Saturation  in Sleep    Oxygen Saturation (%) Mean:   Mean oxygen saturation was 92%            O2 Saturation Range (%):        Ranging between a nadir at 69 and the maximal saturation of 99.5%                               O2 Saturation (minutes) <89%:     15 minutes      Pulse Rate in Sleep :   Pulse Mean (bpm): 82 bpm               Pulse Range:    Between 62 and 103 bpm in normal sinus rhythm.             IMPRESSION:  This HST confirms the  presence of a moderate degree of obstructive sleep apnea, reduced overall sleep efficiency and a moderate degree of sleep hypoxia. Snoring was detected and was mild to moderate with the highest volume while in supine sleep.  Treatment with positive airway pressure therapy is highly recommended.  The patient will be advised to proceed with an auto titration device at home.  This device will be ordered and the order sent to the durable medical equipment company that contracts with her health insurance.  A laboratory attended CPAP titration could be considered if the patient has trouble to tolerate CPAP, if the downloads on CPAP show central sleep apnea arising  or unexplained other insufficient control of obstructive sleep apnea.  Concomitant weight loss is recommended.  Please note, that untreated obstructive sleep apnea can carry additional perioperative morbidity and that patients with obstructive sleep apnea should receive perioperative positive airway pressure therapy.  It would be important to have surgeons and anesthesiologists made aware of the diagnosis.   RECOMMENDATION: The patient should be cautioned not to drive or work at heights or operate machinery or equipment when too tired or too sleepy to do so.  Review and reiteration of good sleep hygiene measures was part of the initial consultation, with unbiased material available online through the [employer] of health or the [facility] brochure:" Your guide to better sleep".   An order for a auto titration capable CPAP device has been issued.  A revisit will be offered within 30 to 90 days of CPAP therapy.    NTERPRETING PHYSICIAN:   Neomia Banner, MD  [facility] Sleep Board certified by The [employer] of Sleep Medicine and Diplomate of the [employer] of Sleep Medicine. Board certified In Neurology through the ABPN, Fellow of the [employer] of Neurology.

## 2023-06-15 ENCOUNTER — Encounter (INDEPENDENT_AMBULATORY_CARE_PROVIDER_SITE_OTHER): Payer: Self-pay | Admitting: Internal Medicine

## 2023-06-15 NOTE — Telephone Encounter (Signed)
 Please see mychart message from pt today 06/15/23.

## 2023-08-03 ENCOUNTER — Encounter (INDEPENDENT_AMBULATORY_CARE_PROVIDER_SITE_OTHER): Payer: Self-pay | Admitting: Internal Medicine

## 2023-08-03 ENCOUNTER — Ambulatory Visit (INDEPENDENT_AMBULATORY_CARE_PROVIDER_SITE_OTHER): Admitting: Internal Medicine

## 2023-08-03 VITALS — BP 132/80 | HR 77 | Temp 98.3°F | Ht 66.0 in | Wt 360.0 lb

## 2023-08-03 DIAGNOSIS — Z6841 Body Mass Index (BMI) 40.0 and over, adult: Secondary | ICD-10-CM

## 2023-08-03 DIAGNOSIS — R948 Abnormal results of function studies of other organs and systems: Secondary | ICD-10-CM | POA: Diagnosis not present

## 2023-08-03 DIAGNOSIS — G4733 Obstructive sleep apnea (adult) (pediatric): Secondary | ICD-10-CM | POA: Diagnosis not present

## 2023-08-03 DIAGNOSIS — R7303 Prediabetes: Secondary | ICD-10-CM

## 2023-08-03 MED ORDER — ZEPBOUND 2.5 MG/0.5ML ~~LOC~~ SOAJ
2.5000 mg | SUBCUTANEOUS | 0 refills | Status: DC
Start: 2023-08-03 — End: 2023-09-21

## 2023-08-03 MED ORDER — METFORMIN HCL ER 500 MG PO TB24: 500.0000 mg | ORAL_TABLET | Freq: Two times a day (BID) | ORAL | 0 refills | Status: DC

## 2023-08-03 NOTE — Assessment & Plan Note (Signed)
 Continue with 1200-calorie meal plan Work on increasing volume of physical activity.  Encouraged to begin walking for goal of 150 minutes a week. Start [name]  2.5 mg once a week due to degree of obesity as well as associated complications listed. Repeat metabolic rate at the next office visit

## 2023-08-03 NOTE — Assessment & Plan Note (Signed)
 I reviewed most recent sleep study she had an AHI of 31 consistent with moderate sleep apnea she also has moderate hypoxia.  She is having problems tolerating PAP therapy and will continue to work with sleep specialist on modifications.  Patient would also benefit from GLP-1 aided weight loss to help reduce AHI and symptoms associated with disordered sleep breathing.

## 2023-08-03 NOTE — Assessment & Plan Note (Addendum)
 Patient has a slower than predicted metabolism. IC 1166 vs. calculated 2251. This may contribute to weight gain, chronic fatigue and difficulty losing weight.   We reviewed measures to improve metabolism including not skipping meals, progressive strengthening exercises, increasing protein intake at every meal and maintaining adequate hydration and sleep.  We again emphasized the importance of prioritizing physical activity considering slow metabolic rate.  Repeat IC at the next office visit to check on metabolic rate

## 2023-08-03 NOTE — Assessment & Plan Note (Signed)
 Most recent A1c is  Lab Results  Component Value Date   HGBA1C 5.9 04/15/2023   HGBA1C 5.7 02/23/2019    Patient aware of disease state and risk of progression. This may contribute to abnormal cravings, fatigue and diabetic complications without having diabetes.   Advised to maintain a diet low on simple and processed carbohydrates.  She is currently on metformin  for pharmacoprophylaxis.  She would be a good candidate for GLP-1 therapy considering degree of obesity and associated complications.

## 2023-08-03 NOTE — Progress Notes (Signed)
 Office: [phone]  /  Fax: [phone]  Weight Summary And Biometrics  Vitals Temp: 98.3 F (36.8 C) BP: 132/80 Pulse Rate: 77 SpO2: 97 %   Anthropometric Measurements Height: 5' 6 (1.676 m) Weight: (!) 360 lb (163.3 kg) BMI (Calculated): 58.13 Weight at Last Visit: 355 lb Weight Lost Since Last Visit: 0 lb Weight Gained Since Last Visit: 5 lb Starting Weight: 361 lb Total Weight Loss (lbs): 1 lb (0.454 kg) Peak Weight: 426 lb   Body Composition  Body Fat %: 58.8 % Fat Mass (lbs): 212 lbs Muscle Mass (lbs): 141.2 lbs Visceral Fat Rating : 24    RMR: 1166  Today's Visit #: 5  No data recorded   Subjective   Chief Complaint: Obesity  Melissa Erickson is here to discuss her progress with her obesity treatment plan. She is on the the Category 2 Plan and states she is following her eating plan approximately 65-70 % of the time. She states she is not exercising due to back issues.  Interval History:   Discussed the use of AI scribe software for clinical note transcription with the patient, who gave verbal consent to proceed.  History of Present Illness   Melissa Erickson is a 43 year old female who presents for medical weight management.  Since last office visit she has gained 5 pounds.  She has a history of high cholesterol and a heart attack, which occurred in the context of a gastric bypass surgery complicated by sepsis. She also has a history of polycystic ovarian syndrome and hidradenitis suppurativa.  She underwent a gastric bypass and gastric sleeve surgery in the past. She has prediabetes and a history of obstructive sleep apnea, which was previously severe but is now moderate with a calculated AHI of 34.5. She experiences moderate sleep hypoxia and snoring. She finds the PAP machine intolerable, feeling like she is 'choking' and has been using it inconsistently.  Her metabolic rate was previously measured at 1166 calories which is 50% slower than calculated. She  does not skip meals unless running late and experiences hunger shortly after breakfast but not after lunch or dinner. She was previously on Saxenda  and Wegovy , with weight loss noted on Wegovy  before insurance coverage discontinue coverage  She is not currently on any weight management medications and has not been taking metformin .   No difficulty controlling portions or feeling hungry all the time. She experiences jitteriness if she eats cereal for breakfast but not with other meals.       Barriers identified: , low volume of physical activity at present , slow metabolism for age, sleep apnea, and history of gastric bypass.   Pharmacotherapy for weight loss: She is currently taking Metformin  (off label use for incretin effect and / or insulin  resistance and / or diabetes prevention) with adequate clinical response  and without side effects..   Assessment and Plan   Treatment Plan For Obesity:  Recommended Dietary Goals  Melissa Erickson is currently in the action stage of change. As such, her goal is to continue weight management plan. She has agreed to: continue current plan  Behavioral Intervention  We discussed the following Behavioral Modification Strategies today: continue to work on maintaining a reduced calorie state, getting the recommended amount of protein, incorporating whole foods, making healthy choices, staying well hydrated and practicing mindfulness when eating..  Encouraged her to use AI for meal prepping  Additional resources provided today: None  Recommended Physical Activity Goals  Melissa Erickson has been advised to work  up to 150 minutes of moderate intensity aerobic activity a week and strengthening exercises 2-3 times per week for cardiovascular health, weight loss maintenance and preservation of muscle mass.   She has agreed to :  Think about enjoyable ways to increase daily physical activity and overcoming barriers to exercise and Increase physical activity in their day and reduce  sedentary time (increase NEAT).  Pharmacotherapy  We discussed various medication options to help Melissa Erickson with her weight loss efforts and we both agreed to : start anti-obesity medication.  In addition to reduced calorie nutrition plan (RCNP), behavioral strategies and physical activity, Melissa Erickson would benefit from pharmacotherapy to assist with hunger signals, satiety and cravings. This will reduce obesity-related health risks by inducing weight loss, and help reduce food consumption and adherence to [employer] [facility]) . It may also improve QOL by improving self-confidence and reduce the  setbacks associated with metabolic adaptations.    Patient also has a history of gastric bypass with metabolic adaptations and recent sleep study from March showed moderate sleep apnea with an AHI of 31 and moderate hypoxia.  She also has PCOS, history of MI and prediabetes.  Considering degree of obesity, metabolic adaptations associated with gastric bypass I feel that she is a good candidate for GLP-1 therapy particularly [name]  which is now FDA approved for moderate to severe sleep apnea.  She had been on Saxenda  and Wegovy  in the past with weight loss with the latter but this was discontinued due to lack of coverage.  She experienced weight regain following discontinuation  After discussion of treatment options, mechanisms of action, benefits, side effects, contraindications and shared decision making she is agreeable to starting [name]  2.5 mg once a week. Patient also made aware that medication is indicated for long-term management of obesity and the risk of weight regain following discontinuation of treatment and hence the importance of adhering to medical weight loss plan.  We demonstrated use of device and patient using teach back method was able to demonstrate proper technique.  Associated Conditions Addressed Today  Abnormal metabolism Assessment & Plan: Patient has a slower than predicted metabolism. IC 1166 vs.  calculated 2251. This may contribute to weight gain, chronic fatigue and difficulty losing weight.   We reviewed measures to improve metabolism including not skipping meals, progressive strengthening exercises, increasing protein intake at every meal and maintaining adequate hydration and sleep.  We again emphasized the importance of prioritizing physical activity considering slow metabolic rate.  Repeat IC at the next office visit to check on metabolic rate    Prediabetes Assessment & Plan: Most recent A1c is  Lab Results  Component Value Date   HGBA1C 5.9 04/15/2023   HGBA1C 5.7 02/23/2019    Patient aware of disease state and risk of progression. This may contribute to abnormal cravings, fatigue and diabetic complications without having diabetes.   Advised to maintain a diet low on simple and processed carbohydrates.  She is currently on metformin  for pharmacoprophylaxis.  She would be a good candidate for GLP-1 therapy considering degree of obesity and associated complications.   Orders: -     metFORMIN  HCl ER; Take 1 tablet (500 mg total) by mouth 2 (two) times daily with a meal.  Dispense: 60 tablet; Refill: 0  OSA (obstructive sleep apnea) - moderate Assessment & Plan: I reviewed most recent sleep study she had an AHI of 31 consistent with moderate sleep apnea she also has moderate hypoxia.  She is having problems tolerating PAP therapy and will continue  to work with sleep specialist on modifications.  Patient would also benefit from GLP-1 aided weight loss to help reduce AHI and symptoms associated with disordered sleep breathing.  Orders: -     [name] ; Inject 2.5 mg into the skin once a week.  Dispense: 2 mL; Refill: 0  Morbid obesity with BMI of 50.0-59.9, adult [facility]) Assessment & Plan: Continue with 1200-calorie meal plan Work on increasing volume of physical activity.  Encouraged to begin walking for goal of 150 minutes a week. Start [name]  2.5 mg once a week due  to degree of obesity as well as associated complications listed. Repeat metabolic rate at the next office visit  Orders: -     [name] ; Inject 2.5 mg into the skin once a week.  Dispense: 2 mL; Refill: 0             Objective   Physical Exam:  Blood pressure 132/80, pulse 77, temperature 98.3 F (36.8 C), height 5' 6 (1.676 m), weight (!) 360 lb (163.3 kg), last menstrual period 07/22/2019, SpO2 97%. Body mass index is 58.11 kg/m.  General: She is overweight, cooperative, alert, well developed, and in no acute distress. PSYCH: Has normal mood, affect and thought process.   HEENT: EOMI, sclerae are anicteric. Lungs: Normal breathing effort, no conversational dyspnea. Extremities: No edema.  Neurologic: No gross sensory or motor deficits. No tremors or fasciculations noted.    Diagnostic Data Reviewed:  BMET    Component Value Date/Time   NA 137 04/15/2023 1406   NA 141 09/10/2022 0857   K 3.8 04/15/2023 1406   CL 103 04/15/2023 1406   CO2 26 04/15/2023 1406   GLUCOSE 109 (H) 04/15/2023 1406   BUN 10 04/15/2023 1406   BUN 11 09/10/2022 0857   CREATININE 0.75 04/15/2023 1406   CALCIUM  8.9 04/15/2023 1406   GFRNONAA >60 10/27/2021 0854   GFRAA >60 07/26/2019 0714   Lab Results  Component Value Date   HGBA1C 5.9 04/15/2023   HGBA1C 5.7 02/23/2019   Lab Results  Component Value Date   INSULIN  27.6 (H) 09/10/2022   Lab Results  Component Value Date   TSH 1.02 04/15/2023   CBC    Component Value Date/Time   WBC 7.2 04/15/2023 1406   RBC 4.84 04/15/2023 1406   HGB 13.1 04/15/2023 1406   HGB 13.3 09/10/2022 0857   HCT 40.7 04/15/2023 1406   HCT 41.9 09/10/2022 0857   PLT 220.0 04/15/2023 1406   PLT 200 09/10/2022 0857   MCV 84.2 04/15/2023 1406   MCV 84 09/10/2022 0857   MCH 26.7 09/10/2022 0857   MCH 27.1 10/27/2021 0854   MCHC 32.3 04/15/2023 1406   RDW 14.2 04/15/2023 1406   RDW 13.7 09/10/2022 0857   Iron Studies    Component Value  Date/Time   IRON 52 04/15/2023 1406   TIBC 314 04/15/2023 1406   FERRITIN 22 04/15/2023 1406   IRONPCTSAT 17 04/15/2023 1406   Lipid Panel     Component Value Date/Time   CHOL 206 (H) 04/15/2023 1406   CHOL 209 (H) 09/10/2022 0857   TRIG 117.0 04/15/2023 1406   HDL 38.50 (L) 04/15/2023 1406   HDL 44 09/10/2022 0857   CHOLHDL 5 04/15/2023 1406   VLDL 23.4 04/15/2023 1406   LDLCALC 144 (H) 04/15/2023 1406   LDLCALC 143 (H) 09/10/2022 0857   Hepatic Function Panel     Component Value Date/Time   PROT 7.7 04/15/2023 1406   PROT 6.7 09/10/2022 0857  ALBUMIN 3.7 04/15/2023 1406   ALBUMIN 3.7 (L) 09/10/2022 0857   AST 16 04/15/2023 1406   ALT 14 04/15/2023 1406   ALKPHOS 64 04/15/2023 1406   BILITOT 0.4 04/15/2023 1406   BILITOT 0.3 09/10/2022 0857      Component Value Date/Time   TSH 1.02 04/15/2023 1406   Nutritional Lab Results  Component Value Date   VD25OH 14.57 (L) 04/15/2023   VD25OH 9.0 (L) 09/10/2022   VD25OH 18.21 (L) 08/23/2021    Follow-Up   Return in about 4 weeks (around 08/31/2023) for For Weight Mangement with Dr. Allie Area, Fasting and 30 minutes early for IC.Aaron Aas She was informed of the importance of frequent follow up visits to maximize her success with intensive lifestyle modifications for her multiple health conditions.  Attestation Statement   Reviewed by clinician on day of visit: allergies, medications, problem list, medical history, surgical history, family history, social history, and previous encounter notes.   I have spent 42 minutes in the care of the patient today including: 4 minutes before the visit reviewing and preparing the chart. 28 minutes face-to-face assessing and reviewing listed medical problems as outlined in obesity care plan, providing nutritional and behavioral counseling on topics outlined in the obesity care plan, counseling regarding anti-obesity medication as outlined in obesity care plan, independently interpreting test  results and goals of care, as described in assessment and plan, reviewing and discussing biometric information and progress, and ordering medications - see orders 10 minutes after the visit updating chart and documentation of encounter.    Ladd Picker, MD

## 2023-08-04 ENCOUNTER — Telehealth: Payer: Self-pay | Admitting: Neurology

## 2023-08-04 NOTE — Telephone Encounter (Signed)
 After speaking with POD 3 RN , phone rep was advised to still get pt scheduled because she has to still be seen.  Phone rep called pt but was unable to reach her and there was no option to leave a vm.  Additional attempts will be made to contact pt this week.

## 2023-08-04 NOTE — Telephone Encounter (Signed)
 Phone rep called pt to schedule her initial CPAP f/u.  Pt stated she is having issues with the mask and intends on contacting DME ., She will call back once she has been able to start using CPAP.

## 2023-08-04 NOTE — Telephone Encounter (Signed)
 Pt returning call to schedule initial CPAP  appt . Gave Pt multiple option pt states that she could not make it to any of the appt that were offered . 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 Pt states  she will call back when she get her work schedule

## 2023-08-05 NOTE — Telephone Encounter (Signed)
 Noted the patient has been advised of the importance of needing 31-90 days compliance visit.

## 2023-08-09 ENCOUNTER — Encounter: Payer: Self-pay | Admitting: Family Medicine

## 2023-08-10 MED ORDER — ALPRAZOLAM 0.5 MG PO TABS: 0.5000 mg | ORAL_TABLET | Freq: Every day | ORAL | 0 refills | Status: DC | PRN

## 2023-08-12 ENCOUNTER — Encounter (INDEPENDENT_AMBULATORY_CARE_PROVIDER_SITE_OTHER): Payer: Self-pay | Admitting: Internal Medicine

## 2023-08-20 ENCOUNTER — Telehealth (INDEPENDENT_AMBULATORY_CARE_PROVIDER_SITE_OTHER): Payer: Self-pay

## 2023-08-20 NOTE — Telephone Encounter (Signed)
 PA for [name] 2.5 has been submitted, awaiting PA questions.

## 2023-08-20 NOTE — Telephone Encounter (Signed)
 The PA for [name]  was closed; The response from the insurance company was as follows:   Information regarding your request Your PA has been resolved, no additional PA is required. For further inquiries please contact the number on the back of the member prescription card. (Message [phone])

## 2023-09-03 ENCOUNTER — Ambulatory Visit (INDEPENDENT_AMBULATORY_CARE_PROVIDER_SITE_OTHER): Admitting: Internal Medicine

## 2023-09-03 NOTE — Progress Notes (Signed)
 [facility]- [facility] Orthopaedic Spine Surgery Outpatient Visit   Melissa Erickson  DOB: 27-Aug-1980  MRN: [id number] 09/03/2023     Subjective:    Melissa Erickson is a 43 y.o. (DOB February 06, 1981) female.     Patient presents with  . Back Pain     HPI Patient presents with persisting low back pain with radiation into right glute and wrapping anteriorly over the right thigh. Pain follows the lateral aspect of the leg down to her knee.  Onset Spring 2024. Denies any injury or inciting event.  Associated with numbness and tingling in similar distribution.  Pain is worse with prolonged sitting standing and walking.  Denies any bowel or bladder dysfunction or saddle anesthesias.  She has had multiple TPI's and ESI's at [facility]. Most recently, around May 2025 she developed muscle spasms in her back that came on while driving. It was so bad that she had difficulty walking. She had a R L3-4 TFESI without relief. In fact, it caused her more pain. and presents today for second opinion.   PMH: MI June 2015, knee OA pain, gained 20lbs in the last year due to poor mobility   Patient has trialed Norco 5 twice daily prescribed elsewhere. She has also trialed meloxicam, cyclobenzaprine , gabapentin , Lyrica, tizanidine. Patient has also trialed aquatic therapy in the past without relief. Also had Dry needling with PT w/o relief. She also recently completing chiropractic care, acupuncture and cupping therapy.  INJECTIONS Multiple TPI's elsewhere Right L5-S1 TFESI without relief, elsewhere Right L3-4 TFESI 07/2023, elsewhere  IMAGING Lumbar XR taken today showing severe facet arthrosis in lumbar spine. There is also multilevel anterior bone spurring noted. L3-4, L4-5 and L5-S1 mild subtle spondylolisthesis with mild motion seen between flexion and extension views. There is also mild DDD throughout. No acute fractures.   Lumbar MRI5/2024 at [facility] uploaded today  and reviewed in office showing Moderate to severe right and moderate left L5-S1 neural foraminal narrowing with impingement and ventral displacement of exiting right L5 nerve roots.  Moderate bilateral L4-5 neuroforaminal narrowing.  Moderate right and mild/moderate left L3-4 neural foraminal narrowing.  Degenerative grade 1 anterolisthesis of L3 on L4, L4 on L5, and L5 on S1 with severe bilateral facet arthrosis and hypertrophy at L3-4 through L5-S1   Reviewed and updated this visit by provider: Tobacco  Allergies  Meds  Problems  Med Hx  Surg Hx  Fam Hx       Review of Systems  Constitutional:  Negative for activity change, appetite change, chills, diaphoresis, fatigue, fever and unexpected weight change.  HENT: Negative.    Eyes: Negative.   Respiratory: Negative.  Negative for shortness of breath.   Cardiovascular:  Negative for chest pain, palpitations and leg swelling.  Gastrointestinal: Negative.   Endocrine: Negative.   Genitourinary: Negative.   Musculoskeletal:  Positive for back pain. Negative for arthralgias, gait problem, joint swelling, myalgias and neck pain.  Skin:  Negative for color change and wound.  Allergic/Immunologic: Negative.   Neurological:  Negative for weakness, numbness and headaches.  Hematological: Negative.   Psychiatric/Behavioral: Negative.  Negative for hallucinations, self-injury and sleep disturbance.       Objective:   Vitals:   09/03/23 1436  BP: 136/84  Pulse: 85  Height: 5' 5 (1.651 m)  Weight: (!) 368 lb 3.2 oz (167 kg)  SpO2: 96%  BMI (Calculated): 61.3  PainSc:   6  PainLoc: Back    Constitutional: No distress.  Obese   Musculoskeletal:        General: No tenderness or edema.     Cervical back: Normal range of motion.     Lumbar back: Negative right straight leg raise test and negative left straight leg raise test.  Neurological: She is alert and oriented to person, place, and time.  Skin: Skin is warm and dry.  Right  Hip Exam   Tenderness  The patient is experiencing tenderness in the greater trochanter.  Range of Motion  The patient has normal right hip ROM.   Left Hip Exam   Tenderness  The patient is experiencing no tenderness.   Comments:  Left hip ROM restricted, without pain   Back Exam   Tenderness  The patient is experiencing tenderness in the lumbar.  Range of Motion  Extension:  normal  Flexion:  normal  Lateral bend right:  normal  Lateral bend left:  normal  Rotation right:  normal  Rotation left:  normal   Muscle Strength  The patient has normal back strength.  Tests  Straight leg raise right: negative Straight leg raise left: negative  Reflexes  Patellar:  normal Achilles:  normal Biceps:  normal Babinski's sign: normal   Other  Toe walk: normal Heel walk: normal Sensation: normal Gait: normal  Erythema: no back redness Scars: absent            Assessment / Plan:   ASSESSMENT 1. Lumbar spondylosis (Primary) -     XR Spine Lumbar Min 4 Views 2. Lumbar radiculopathy -     MRI Spine Lumbar WO Contrast; Future 3. Greater trochanteric bursitis of right hip -     AMB REFERRAL TO SPORTS MEDICINE 4. Spinal instabilities of lumbar region -     MRI Spine Lumbar WO Contrast; Future 5. Spondylolisthesis of lumbar region -     MRI Spine Lumbar WO Contrast; Future    PLAN Presentation c/w advanced facet disease with multilevel spondylolisthesis. Her MRI shows severe neural impingement at L5-S1 but that is not where her symptoms are. She did demonstrate pain in right GT bursa region suggesting that some of her pain is more c/w bursitis pain. We discussed trialing an US  guided steroid injection and patient expressed interest. Plan to arrange this with Dr. Tobie this upcoming Monday. She will require a long needle for access. Since she did say that her back symptoms have worsened since her last mri. Coupled with the fact that she has now developed severe  muscle spasms, I will also order an updated lumbar mri and have her follow up with Dr. Gust for mri review. At that point we can discuss next steps. Consider MBB for RFA. Questions were encouraged and answered. Patient was instructed to call with any new concerns or problems.   Risks, benefits, and alternatives of the medications and treatment plan prescribed today were discussed, and patient expressed understanding. Plan follow-up as discussed or as needed if any worsening symptoms or change in condition.    Elveria Hoit, PA   Documentation for time-based billing:  Total time spent of date of service was 45 minutes.  Patient care activities included preparing to see the patient such as reviewing the patient record, obtaining and/or reviewing separately obtained history, performing a medically appropriate history and physical examination, and documenting clinical information in the electronic or other health record.

## 2023-09-07 ENCOUNTER — Ambulatory Visit (INDEPENDENT_AMBULATORY_CARE_PROVIDER_SITE_OTHER): Admitting: Internal Medicine

## 2023-09-07 ENCOUNTER — Encounter (INDEPENDENT_AMBULATORY_CARE_PROVIDER_SITE_OTHER): Payer: Self-pay | Admitting: Internal Medicine

## 2023-09-07 VITALS — BP 134/84 | HR 77 | Temp 97.9°F | Ht 66.0 in | Wt 368.0 lb

## 2023-09-07 DIAGNOSIS — Z6841 Body Mass Index (BMI) 40.0 and over, adult: Secondary | ICD-10-CM

## 2023-09-07 DIAGNOSIS — Z903 Acquired absence of stomach [part of]: Secondary | ICD-10-CM

## 2023-09-07 DIAGNOSIS — I252 Old myocardial infarction: Secondary | ICD-10-CM | POA: Diagnosis not present

## 2023-09-07 DIAGNOSIS — R7303 Prediabetes: Secondary | ICD-10-CM | POA: Diagnosis not present

## 2023-09-07 DIAGNOSIS — R948 Abnormal results of function studies of other organs and systems: Secondary | ICD-10-CM

## 2023-09-07 DIAGNOSIS — G4733 Obstructive sleep apnea (adult) (pediatric): Secondary | ICD-10-CM | POA: Diagnosis not present

## 2023-09-07 MED ORDER — METFORMIN HCL ER 500 MG PO TB24: 500.0000 mg | ORAL_TABLET | Freq: Two times a day (BID) | ORAL | 0 refills | Status: DC

## 2023-09-07 NOTE — Assessment & Plan Note (Addendum)
 Improved.  Her resting energy expenditure today is 2174 calculated is 2200 this is improved from initial assessment of 1166

## 2023-09-07 NOTE — Assessment & Plan Note (Addendum)
 Most recent A1c is  Lab Results  Component Value Date   HGBA1C 5.9 04/15/2023   HGBA1C 5.7 02/23/2019    Patient aware of disease state and risk of progression. This may contribute to abnormal cravings, fatigue and diabetic complications without having diabetes.   Advised to maintain a diet low on simple and processed carbohydrates.  She is currently on metformin  for pharmacoprophylaxis.  She would be a good candidate for GLP-1 therapy considering degree of obesity and associated complications.

## 2023-09-07 NOTE — Assessment & Plan Note (Signed)
 Gastric Sleeve 2015, [name]. Complicated with postoperative pneumonia Presurgical weight 426, nadir 310 at 12 months with gradual weight regain starting 2017.  She is experiencing metabolic adaptations associated with gastric sleeve.  Likely loss of muscle as she still has a high body fat percentage despite weight loss following surgery.

## 2023-09-07 NOTE — Progress Notes (Signed)
 Office: [phone]  /  Fax: [phone]  Weight Summary and Body Composition Analysis (BIA)  Vitals Temp: 97.9 F (36.6 C) BP: 134/84 Pulse Rate: 77 SpO2: 98 %   Anthropometric Measurements Height: 5' 6 (1.676 m) Weight: (!) 368 lb (166.9 kg) BMI (Calculated): 59.43 Weight at Last Visit: 360 lb Weight Lost Since Last Visit: 0 lb Weight Gained Since Last Visit: 8 lb Starting Weight: 361 lb Total Weight Loss (lbs): 0 lb (0 kg) Peak Weight: 426 lb   Body Composition  Body Fat %: 60.9 % Fat Mass (lbs): 224.2 lbs Muscle Mass (lbs): 136.6 lbs Visceral Fat Rating : 25    RMR: 2174  Today's Visit #: 6  No data recorded  Subjective   Chief Complaint: Obesity  Interval History Discussed the use of AI scribe software for clinical note transcription with the patient, who gave verbal consent to proceed.   Discussed the use of AI scribe software for clinical note transcription with the patient, who gave verbal consent to proceed.  History of Present Illness  Melissa Erickson is a 43 year old female with prediabetes and sleep apnea who presents for medical weight management.  Since last office visit she has gained 8 pounds she reports following a 1200-calorie target with good adherence but is not tracking calories she reports eating more whole foods it is not getting the recommended amount of protein or maintaining adequate hydration denies skipping meals she is not exercising.  She experiences severe inflammation and pain in both hips due to bursitis, significantly impacting her mobility over the past year. Additionally, she suffers from sciatic nerve pain and bone spurs in her lower spine. She is scheduled to receive an injection in her hip to alleviate the pain.  She underwent gastric bypass surgery in 2015, initially losing weight but began regaining it in 2017. Her current weight is 368 pounds, with a body fat percentage of 60%, though she has not returned to her  presurgical weight of 426 pounds. She consumes three meals a day, typically consisting of an egg, oatmeal, and grapes for breakfast; grilled chicken breast, broccoli with cheese, and a small sweet potato for lunch; and the remainder of her lunch for dinner. Due to the gastric bypass, she often splits meals into two.  She has been prescribed [name]  for weight management but faces issues with insurance coverage, resulting in high out-of-pocket costs. She is currently taking metformin  for her prediabetes.  She uses a CPAP machine for sleep apnea, typically for five to six hours per night, and reports improved sleep quality. Her resting energy expenditure is 2174 calories.      Challenges affecting patient progress: none, low volume of physical activity at present , orthopedic problems, medical conditions or chronic pain affecting mobility, slow metabolism for age, and having difficulties with GLP-1 or AOM coverage.    Pharmacotherapy for weight management: She is currently taking Metformin  (off label use for incretin effect and / or insulin  resistance and / or diabetes prevention) with adequate clinical response  and without side effects..   Assessment and Plan   Treatment Plan For Obesity:  Recommended Dietary Goals  Melissa Erickson is currently in the action stage of change. As such, her goal is to continue weight management plan. She has agreed to: follow the Category 3 plan - 1500 kcal per day  Behavioral Health and Counseling  We discussed the following behavioral modification strategies today: increasing lean protein intake to established goals, decreasing simple carbohydrates , and  work on Psychologist, counselling.  Additional education and resources provided today: None  Recommended Physical Activity Goals  Melissa Erickson has been advised to work up to 150 minutes of moderate intensity aerobic activity a week and strengthening exercises 2-3 times per week for  cardiovascular health, weight loss maintenance and preservation of muscle mass.   She has agreed to :  Think about enjoyable ways to increase daily physical activity and overcoming barriers to exercise and Increase physical activity in their day and reduce sedentary time (increase NEAT).  Medical Interventions and Pharmacotherapy  We discussed various medication options to help Melissa Erickson with her weight loss efforts and we both agreed to : Continue metformin .  I will have staff follow-up on prior authorization for [name] .  Associated Conditions Impacted by Obesity Treatment  Assessment & Plan Prediabetes Most recent A1c is  Lab Results  Component Value Date   HGBA1C 5.9 04/15/2023   HGBA1C 5.7 02/23/2019    Patient aware of disease state and risk of progression. This may contribute to abnormal cravings, fatigue and diabetic complications without having diabetes.   Advised to maintain a diet low on simple and processed carbohydrates.  She is currently on metformin  for pharmacoprophylaxis.  She would be a good candidate for GLP-1 therapy considering degree of obesity and associated complications.  OSA (obstructive sleep apnea) - moderate She has moderate sleep apnea and uses a CPAP machine for 5-6 hours per night, improving sleep quality. - Continue using CPAP machine for at least 5-6 hours per night.  - Would benefit from GLP-1 treatment Morbid obesity with BMI of 50.0-59.9, adult (HCC) She is experiencing weight management challenges with a current weight of 368 lbs and a history of gastric bypass in 2015. Weight regain has occurred since 2017, with a body fat percentage of 60%. Mobility is limited due to musculoskeletal pain. Her metabolic rate has improved to 7825 kcal/day, but inadequate protein intake may contribute to muscle loss and weight management difficulties. She is on metformin  and prescribed [name] , but insurance coverage issues exist. A high-protein, low-carb diet and AI  tools for meal planning were discussed.  - Prescribe a 1500 calorie meal plan focusing on high protein and low carbohydrates. - Encourage the use of AI tools to create a personalized meal plan and grocery list. - Advise tracking protein intake to reach approximately 90 grams per day. - Investigate insurance coverage for [name]  and consider Wegovy  if not covered. - Schedule follow-up in one month to assess progress Abnormal metabolism Improved.  Her resting energy expenditure today is 2174 calculated is 2200 this is improved from initial assessment of 1166 H/O gastric sleeve Gastric Sleeve 2015, [name]. Complicated with postoperative pneumonia Presurgical weight 426, nadir 310 at 12 months with gradual weight regain starting 2017.  She is experiencing metabolic adaptations associated with gastric sleeve.  Likely loss of muscle as she still has a high body fat percentage despite weight loss following surgery. History of MI (myocardial infarction) Patient has a history of NSTEMI felt to be due to secondary ischemia.  She has multiple risk factors for cardiovascular disease including prediabetes and moderate sleep apnea.  She would benefit from weight loss assisted by GLP-1 to reduce cardiovascular risk.          Objective   Physical Exam:  Blood pressure 134/84, pulse 77, temperature 97.9 F (36.6 C), height 5' 6 (1.676 m), weight (!) 368 lb (166.9 kg), last menstrual period 07/22/2019, SpO2 98%. Body mass index  is 59.4 kg/m.  General: She is overweight, cooperative, alert, well developed, and in no acute distress. PSYCH: Has normal mood, affect and thought process.   HEENT: EOMI, sclerae are anicteric. Lungs: Normal breathing effort, no conversational dyspnea. Extremities: No edema.  Neurologic: No gross sensory or motor deficits. No tremors or fasciculations noted.    Diagnostic Data Reviewed:  BMET    Component Value Date/Time   NA 137 04/15/2023 1406   NA 141  09/10/2022 0857   K 3.8 04/15/2023 1406   CL 103 04/15/2023 1406   CO2 26 04/15/2023 1406   GLUCOSE 109 (H) 04/15/2023 1406   BUN 10 04/15/2023 1406   BUN 11 09/10/2022 0857   CREATININE 0.75 04/15/2023 1406   CALCIUM  8.9 04/15/2023 1406   GFRNONAA >60 10/27/2021 0854   GFRAA >60 07/26/2019 0714   Lab Results  Component Value Date   HGBA1C 5.9 04/15/2023   HGBA1C 5.7 02/23/2019   Lab Results  Component Value Date   INSULIN  27.6 (H) 09/10/2022   Lab Results  Component Value Date   TSH 1.02 04/15/2023   CBC    Component Value Date/Time   WBC 7.2 04/15/2023 1406   RBC 4.84 04/15/2023 1406   HGB 13.1 04/15/2023 1406   HGB 13.3 09/10/2022 0857   HCT 40.7 04/15/2023 1406   HCT 41.9 09/10/2022 0857   PLT 220.0 04/15/2023 1406   PLT 200 09/10/2022 0857   MCV 84.2 04/15/2023 1406   MCV 84 09/10/2022 0857   MCH 26.7 09/10/2022 0857   MCH 27.1 10/27/2021 0854   MCHC 32.3 04/15/2023 1406   RDW 14.2 04/15/2023 1406   RDW 13.7 09/10/2022 0857   Iron Studies    Component Value Date/Time   IRON 52 04/15/2023 1406   TIBC 314 04/15/2023 1406   FERRITIN 22 04/15/2023 1406   IRONPCTSAT 17 04/15/2023 1406   Lipid Panel     Component Value Date/Time   CHOL 206 (H) 04/15/2023 1406   CHOL 209 (H) 09/10/2022 0857   TRIG 117.0 04/15/2023 1406   HDL 38.50 (L) 04/15/2023 1406   HDL 44 09/10/2022 0857   CHOLHDL 5 04/15/2023 1406   VLDL 23.4 04/15/2023 1406   LDLCALC 144 (H) 04/15/2023 1406   LDLCALC 143 (H) 09/10/2022 0857   Hepatic Function Panel     Component Value Date/Time   PROT 7.7 04/15/2023 1406   PROT 6.7 09/10/2022 0857   ALBUMIN 3.7 04/15/2023 1406   ALBUMIN 3.7 (L) 09/10/2022 0857   AST 16 04/15/2023 1406   ALT 14 04/15/2023 1406   ALKPHOS 64 04/15/2023 1406   BILITOT 0.4 04/15/2023 1406   BILITOT 0.3 09/10/2022 0857      Component Value Date/Time   TSH 1.02 04/15/2023 1406   Nutritional Lab Results  Component Value Date   VD25OH 14.57 (L)  04/15/2023   VD25OH 9.0 (L) 09/10/2022   VD25OH 18.21 (L) 08/23/2021    Medications: Outpatient Encounter Medications as of 09/07/2023  Medication Sig   albuterol  (VENTOLIN  HFA) 108 (90 Base) MCG/ACT inhaler Inhale 2 puffs into the lungs every 4 (four) hours as needed for wheezing or shortness of breath.   ALPRAZolam  (XANAX ) 0.5 MG tablet Take 1 tablet (0.5 mg total) by mouth daily as needed for anxiety.   Multiple Vitamins-Minerals (MULTIVITAMIN WITH MINERALS) tablet Take 1 tablet by mouth daily.   sertraline  (ZOLOFT ) 100 MG tablet Take 1 tablet (100 mg total) by mouth daily.   tirzepatide  ([name] ) 2.5 MG/0.5ML Pen Inject 2.5  mg into the skin once a week.   [DISCONTINUED] metFORMIN  (GLUCOPHAGE -XR) 500 MG 24 hr tablet Take 1 tablet (500 mg total) by mouth 2 (two) times daily with a meal.   metFORMIN  (GLUCOPHAGE -XR) 500 MG 24 hr tablet Take 1 tablet (500 mg total) by mouth 2 (two) times daily with a meal.   No facility-administered encounter medications on file as of 09/07/2023.     Follow-Up   Return in about 4 weeks (around 10/05/2023) for For Weight Mangement with Dr. Francyne.SABRA She was informed of the importance of frequent follow up visits to maximize her success with intensive lifestyle modifications for her multiple health conditions.  Attestation Statement   Reviewed by clinician on day of visit: allergies, medications, problem list, medical history, surgical history, family history, social history, and previous encounter notes.     Lucas Francyne, MD

## 2023-09-07 NOTE — Assessment & Plan Note (Addendum)
 She is experiencing weight management challenges with a current weight of 368 lbs and a history of gastric bypass in 2015. Weight regain has occurred since 2017, with a body fat percentage of 60%. Mobility is limited due to musculoskeletal pain. Her metabolic rate has improved to 7825 kcal/day, but inadequate protein intake may contribute to muscle loss and weight management difficulties. She is on metformin  and prescribed [name] , but insurance coverage issues exist. A high-protein, low-carb diet and AI tools for meal planning were discussed.  - Prescribe a 1500 calorie meal plan focusing on high protein and low carbohydrates. - Encourage the use of AI tools to create a personalized meal plan and grocery list. - Advise tracking protein intake to reach approximately 90 grams per day. - Investigate insurance coverage for [name]  and consider Wegovy  if not covered. - Schedule follow-up in one month to assess progress

## 2023-09-07 NOTE — Assessment & Plan Note (Addendum)
 She has moderate sleep apnea and uses a CPAP machine for 5-6 hours per night, improving sleep quality. - Continue using CPAP machine for at least 5-6 hours per night.  - Would benefit from GLP-1 treatment

## 2023-09-08 ENCOUNTER — Ambulatory Visit: Payer: Self-pay | Admitting: General Surgery

## 2023-09-08 NOTE — H&P (Signed)
 REFERRING PHYSICIAN:  Axner, Laymon SAILOR, PA PROVIDER:  RICHERD SILVERSMITH, MD MRN: [id number] DOB: 1980/09/22 DATE OF ENCOUNTER: 09/08/2023 Subjective   Reason for Referral: Lipoma History of Present Illness: Melissa Erickson is a 43 y.o. female who is seen today as an office consultation for evaluation of back lipoma  Patient has had for many years. Not sure if it has grown in size. Does not typically bother her, only in the summer it sometimes forms blisters or irritation over the skin as it rubs against her clothing and this is why she would like it removed. Of note, has chronic back pain.    Review of Systems: A complete review of systems was obtained from the patient.  I have reviewed this information and discussed as appropriate with the patient.  ROS otherwise negative except as noted in HPI.   Medical History: Past Medical History: Diagnosis Date  Sleep apnea    There is no problem list on file for this patient.   Past Surgical History: Procedure Laterality Date  LAPAROSCOPIC GASTRIC BYPASS  2015  CESAREAN SECTION    CHOLECYSTECTOMY    HYSTERECTOMY      Allergies Allergen Reactions  Nsaids (Non-Steroidal Anti-Inflammatory Drug) Other (See Comments)   Gastric Bypass surgery   Current Outpatient Medications on File Prior to Visit Medication Sig Dispense Refill  sertraline  (ZOLOFT ) 100 MG tablet Take 100 mg by mouth once daily    No current facility-administered medications on file prior to visit.   Family History Problem Relation Age of Onset  Stroke Mother   Diabetes Mother     Social History  Tobacco Use Smoking Status Never Smokeless Tobacco Never    Social History  Socioeconomic History  Marital status: Unknown Tobacco Use  Smoking status: Never  Smokeless tobacco: Never Substance and Sexual Activity  Drug use: Never  Social Drivers of Catering manager Strain: Low Risk  (09/03/2023)  Received from [facility]  Overall Financial  Resource Strain (CARDIA)   How hard is it for you to pay for the very basics like food, housing, medical care, and heating?: Not hard at all Food Insecurity: No Food Insecurity (09/03/2023)  Received from [facility]  Hunger Vital Sign   Within the past 12 months, you worried that your food would run out before you got the money to buy more.: Never true   Within the past 12 months, the food you bought just didn't last and you didn't have money to get more.: Never true Transportation Needs: No Transportation Needs (09/03/2023)  Received from [facility] - Transportation   In the past 12 months, has lack of transportation kept you from medical appointments or from getting medications?: No   In the past 12 months, has lack of transportation kept you from meetings, work, or from getting things needed for daily living?: No Housing Stability: Low Risk  (09/03/2023)  Received from [facility] Stability Vital Sign   In the last 12 months, was there a time when you were not able to pay the mortgage or rent on time?: No   In the past 12 months, how many times have you moved where you were living?: 0   At any time in the past 12 months, were you homeless or living in a shelter (including now)?: No   Objective:  Vitals:  09/08/23 0921 BP: 130/87 Pulse: (!) 117 Temp: 37.1 C (98.7 F) SpO2: 93% Weight: (!) 168.6 kg (371 lb 9.6 oz) Height: 165.1  cm (5' 5) PainSc: 0-No pain   Body mass index is 61.84 kg/m.  Physical Exam: General: No acute distress, well appearing, morbidly obese HEENT: PERRL, hearing grossly normal, mucous membranes moist CV: Regular rate and rhythm Pulm: Normal work of breathing on room air Back: ~12x7cm lipoma to left back Extremities: Warm and well perfused Neuro: A&O x4, no focal neurologic deficits Psych: Appropriate mood and effect  Labs, Imaging and Diagnostic Testing: I personally reviewed notes from dermatologist  Assessment and  Plan:    Diagnoses and all orders for this visit:  Lipoma of back    - Will proceed with excision of back lipoma - We discussed risks of surgery including but not limited to: bleeding, infection, post op seroma, post-op hematoma, damage to surrounding structures, recurrence, pain. Patient voiced understanding and wishes to proceed with surgery.    I spent a total of 35 minutes in both face-to-face and non-face-to-face activities, excluding procedures performed, for this visit on the date of this encounter.  Melissa Silversmith, MD [facility]

## 2023-09-13 ENCOUNTER — Ambulatory Visit: Payer: Self-pay | Admitting: General Surgery

## 2023-09-14 ENCOUNTER — Encounter (INDEPENDENT_AMBULATORY_CARE_PROVIDER_SITE_OTHER): Payer: Self-pay | Admitting: Internal Medicine

## 2023-09-15 LAB — COMPREHENSIVE METABOLIC PANEL WITH GFR
Albumin: 4.2 (ref 3.5–5.0)
Calcium: 9.6 (ref 8.7–10.7)
Globulin: 3.3
eGFR: 68

## 2023-09-15 LAB — PROTEIN / CREATININE RATIO, URINE
Albumin, U: 6.6
Creatinine, Urine: 116.8

## 2023-09-15 LAB — VITAMIN D 25 HYDROXY (VIT D DEFICIENCY, FRACTURES): Vit D, 25-Hydroxy: 28.7

## 2023-09-15 LAB — BASIC METABOLIC PANEL WITH GFR
BUN: 16 (ref 4–21)
CO2: 23 — AB (ref 13–22)
Chloride: 105 (ref 99–108)
Creatinine: 0.9 (ref 0.5–1.1)
Glucose: 99
Potassium: 4.3 meq/L (ref 3.5–5.1)
Sodium: 143 (ref 137–147)

## 2023-09-15 LAB — HEPATIC FUNCTION PANEL
ALT: 14 U/L (ref 7–35)
AST: 18 (ref 13–35)
Alkaline Phosphatase: 77 (ref 25–125)
Bilirubin, Total: 0.4

## 2023-09-15 LAB — HEMOGLOBIN A1C: Hemoglobin A1C: 5.3

## 2023-09-15 LAB — LIPID PANEL
Cholesterol: 152 (ref 0–200)
HDL: 43 (ref 35–70)
LDL Cholesterol: 90
LDl/HDL Ratio: 3.5
Triglycerides: 106 (ref 40–160)

## 2023-09-15 LAB — CBC AND DIFFERENTIAL
HCT: 40 (ref 36–46)
Hemoglobin: 12.7 (ref 12.0–16.0)
Platelets: 231 K/uL (ref 150–400)
WBC: 4.6

## 2023-09-15 LAB — CBC: RBC: 4.26 (ref 3.87–5.11)

## 2023-09-16 ENCOUNTER — Encounter (INDEPENDENT_AMBULATORY_CARE_PROVIDER_SITE_OTHER): Payer: Self-pay

## 2023-09-21 ENCOUNTER — Other Ambulatory Visit (INDEPENDENT_AMBULATORY_CARE_PROVIDER_SITE_OTHER): Payer: Self-pay

## 2023-09-21 MED ORDER — WEGOVY 0.25 MG/0.5ML ~~LOC~~ SOAJ: 0.2500 mg | SUBCUTANEOUS | 0 refills | Status: DC

## 2023-09-26 ENCOUNTER — Encounter

## 2023-09-28 NOTE — Pre-Procedure Instructions (Signed)
 Surgical Instructions   Your procedure is scheduled on Friday, August 15th. Report to [facility] Main Entrance A at 08:45 A.M., then check in with the Admitting office. Any questions or running late day of surgery: call [phone]  Questions prior to your surgery date: call [phone], Monday-Friday, 8am-4pm. If you experience any cold or flu symptoms such as cough, fever, chills, shortness of breath, etc. between now and your scheduled surgery, please notify us  at the above number.     Remember:  Do not eat or drink after midnight the night before your surgery    Take these medicines the morning of surgery with A SIP OF WATER  sertraline  (ZOLOFT )    May take these medicines IF NEEDED: albuterol  (VENTOLIN  HFA)- bring inhaler with you on day of surgery ALPRAZolam  (XANAX )   One week prior to surgery, STOP taking any Aspirin (unless otherwise instructed by your surgeon) Aleve , Naproxen , Ibuprofen , Motrin , Advil , Goody's, BC's, all herbal medications, fish oil, and non-prescription vitamins.  WHAT DO I DO ABOUT MY DIABETES MEDICATION?   Do not take metFORMIN  (GLUCOPHAGE -XR) the morning of surgery.   HOW TO MANAGE YOUR DIABETES BEFORE AND AFTER SURGERY  Why is it important to control my blood sugar before and after surgery? Improving blood sugar levels before and after surgery helps healing and can limit problems. A way of improving blood sugar control is eating a healthy diet by:  Eating less sugar and carbohydrates  Increasing activity/exercise  Talking with your doctor about reaching your blood sugar goals High blood sugars (greater than 180 mg/dL) can raise your risk of infections and slow your recovery, so you will need to focus on controlling your diabetes during the weeks before surgery. Make sure that the doctor who takes care of your diabetes knows about your planned surgery including the date and location.  How do I manage my blood sugar before surgery? Check  your blood sugar at least 4 times a day, starting 2 days before surgery, to make sure that the level is not too high or low.  Check your blood sugar the morning of your surgery when you wake up and every 2 hours until you get to the Short Stay unit.  If your blood sugar is less than 70 mg/dL, you will need to treat for low blood sugar: Do not take insulin . Treat a low blood sugar (less than 70 mg/dL) with  cup of clear juice (cranberry or apple), 4 glucose tablets, OR glucose gel. Recheck blood sugar in 15 minutes after treatment (to make sure it is greater than 70 mg/dL). If your blood sugar is not greater than 70 mg/dL on recheck, call [phone] for further instructions. Report your blood sugar to the short stay nurse when you get to Short Stay.  If you are admitted to the hospital after surgery: Your blood sugar will be checked by the staff and you will probably be given insulin  after surgery (instead of oral diabetes medicines) to make sure you have good blood sugar levels. The goal for blood sugar control after surgery is 80-180 mg/dL.                     Do NOT Smoke (Tobacco/Vaping) for 24 hours prior to your procedure.  If you use a CPAP at night, you may bring your mask/headgear for your overnight stay.   You will be asked to remove any contacts, glasses, piercing's, hearing aid's, dentures/partials prior to surgery. Please bring cases for these items  if needed.    Patients discharged the day of surgery will not be allowed to drive home, and someone needs to stay with them for 24 hours.  SURGICAL WAITING ROOM VISITATION Patients may have no more than 2 support people in the waiting area - these visitors may rotate.   Pre-op nurse will coordinate an appropriate time for 1 ADULT support person, who may not rotate, to accompany patient in pre-op.  Children under the age of 99 must have an adult with them who is not the patient and must remain in the main waiting area with an  adult.  If the patient needs to stay at the hospital during part of their recovery, the visitor guidelines for inpatient rooms apply.  Please refer to the [facility] website for the visitor guidelines for any additional information.   If you received a COVID test during your pre-op visit  it is requested that you wear a mask when out in public, stay away from anyone that may not be feeling well and notify your surgeon if you develop symptoms. If you have been in contact with anyone that has tested positive in the last 10 days please notify you surgeon.      Pre-operative CHG Bathing Instructions   You can play a key role in reducing the risk of infection after surgery. Your skin needs to be as free of germs as possible. You can reduce the number of germs on your skin by washing with CHG (chlorhexidine  gluconate) soap before surgery. CHG is an antiseptic soap that kills germs and continues to kill germs even after washing.   DO NOT use if you have an allergy to chlorhexidine /CHG or antibacterial soaps. If your skin becomes reddened or irritated, stop using the CHG and notify one of our RNs at [phone].              TAKE A SHOWER THE NIGHT BEFORE SURGERY AND THE DAY OF SURGERY    Please keep in mind the following:  DO NOT shave, including legs and underarms, 48 hours prior to surgery.   You may shave your face before/day of surgery.  Place clean sheets on your bed the night before surgery Use a clean washcloth (not used since being washed) for each shower. DO NOT sleep with pet's night before surgery.  CHG Shower Instructions:  Wash your face and private area with normal soap. If you choose to wash your hair, wash first with your normal shampoo.  After you use shampoo/soap, rinse your hair and body thoroughly to remove shampoo/soap residue.  Turn the water OFF and apply half the bottle of CHG soap to a CLEAN washcloth.  Apply CHG soap ONLY FROM YOUR NECK DOWN TO YOUR TOES (washing  for 3-5 minutes)  DO NOT use CHG soap on face, private areas, open wounds, or sores.  Pay special attention to the area where your surgery is being performed.  If you are having back surgery, having someone wash your back for you may be helpful. Wait 2 minutes after CHG soap is applied, then you may rinse off the CHG soap.  Pat dry with a clean towel  Put on clean pajamas    Additional instructions for the day of surgery: DO NOT APPLY any lotions, deodorants, cologne, or perfumes.   Do not wear jewelry or makeup Do not wear nail polish, gel polish, artificial nails, or any other type of covering on natural nails (fingers and toes) Do not bring valuables to  the hospital. [facility] is not responsible for valuables/personal belongings. Put on clean/comfortable clothes.  Please brush your teeth.  Ask your nurse before applying any prescription medications to the skin.

## 2023-09-29 ENCOUNTER — Encounter (HOSPITAL_COMMUNITY)
Admission: RE | Admit: 2023-09-29 | Discharge: 2023-09-29 | Disposition: A | Discharge status: Home / Self Care | Source: Ambulatory Visit | Attending: General Surgery | Admitting: General Surgery

## 2023-09-29 ENCOUNTER — Encounter (HOSPITAL_COMMUNITY): Payer: Self-pay

## 2023-09-29 ENCOUNTER — Other Ambulatory Visit: Payer: Self-pay

## 2023-09-29 VITALS — BP 136/88 | HR 73 | Temp 100.0°F | Resp 16 | Ht 65.0 in | Wt 364.9 lb

## 2023-09-29 DIAGNOSIS — Z01812 Encounter for preprocedural laboratory examination: Secondary | ICD-10-CM | POA: Diagnosis present

## 2023-09-29 DIAGNOSIS — J45909 Unspecified asthma, uncomplicated: Secondary | ICD-10-CM | POA: Diagnosis not present

## 2023-09-29 DIAGNOSIS — E119 Type 2 diabetes mellitus without complications: Secondary | ICD-10-CM | POA: Diagnosis not present

## 2023-09-29 DIAGNOSIS — G4733 Obstructive sleep apnea (adult) (pediatric): Secondary | ICD-10-CM | POA: Insufficient documentation

## 2023-09-29 DIAGNOSIS — I4510 Unspecified right bundle-branch block: Secondary | ICD-10-CM | POA: Insufficient documentation

## 2023-09-29 DIAGNOSIS — Z01810 Encounter for preprocedural cardiovascular examination: Secondary | ICD-10-CM | POA: Diagnosis present

## 2023-09-29 DIAGNOSIS — Z6841 Body Mass Index (BMI) 40.0 and over, adult: Secondary | ICD-10-CM | POA: Insufficient documentation

## 2023-09-29 DIAGNOSIS — Z9884 Bariatric surgery status: Secondary | ICD-10-CM | POA: Insufficient documentation

## 2023-09-29 DIAGNOSIS — I252 Old myocardial infarction: Secondary | ICD-10-CM | POA: Insufficient documentation

## 2023-09-29 DIAGNOSIS — Z01818 Encounter for other preprocedural examination: Secondary | ICD-10-CM | POA: Diagnosis not present

## 2023-09-29 DIAGNOSIS — R600 Localized edema: Secondary | ICD-10-CM | POA: Diagnosis not present

## 2023-09-29 DIAGNOSIS — E66813 Obesity, class 3: Secondary | ICD-10-CM | POA: Insufficient documentation

## 2023-09-29 DIAGNOSIS — E282 Polycystic ovarian syndrome: Secondary | ICD-10-CM | POA: Insufficient documentation

## 2023-09-29 HISTORY — DX: Prediabetes: R73.03

## 2023-09-29 LAB — GLUCOSE, CAPILLARY: Glucose-Capillary: 77 mg/dL (ref 70–99)

## 2023-09-29 LAB — CBC
HCT: 43.6 % (ref 36.0–46.0)
Hemoglobin: 13.6 g/dL (ref 12.0–15.0)
MCH: 27 pg (ref 26.0–34.0)
MCHC: 31.2 g/dL (ref 30.0–36.0)
MCV: 86.5 fL (ref 80.0–100.0)
Platelets: 219 K/uL (ref 150–400)
RBC: 5.04 MIL/uL (ref 3.87–5.11)
RDW: 13.7 % (ref 11.5–15.5)
WBC: 7 K/uL (ref 4.0–10.5)
nRBC: 0 % (ref 0.0–0.2)

## 2023-09-29 LAB — BASIC METABOLIC PANEL WITH GFR
Anion gap: 9 (ref 5–15)
BUN: 11 mg/dL (ref 6–20)
CO2: 24 mmol/L (ref 22–32)
Calcium: 8.9 mg/dL (ref 8.9–10.3)
Chloride: 105 mmol/L (ref 98–111)
Creatinine, Ser: 0.8 mg/dL (ref 0.44–1.00)
GFR, Estimated: >60 mL/min (ref >60)
Glucose, Bld: 80 mg/dL (ref 70–99)
Potassium: 4 mmol/L (ref 3.5–5.1)
Sodium: 138 mmol/L (ref 135–145)

## 2023-09-29 NOTE — Pre-Procedure Instructions (Signed)
 Surgical Instructions   Your procedure is scheduled on Friday, August 15th. Report to [facility] Main Entrance A at 8:45 A.M., then check in with the Admitting office. Any questions or running late day of surgery: call [phone]  Questions prior to your surgery date: call [phone], Monday-Friday, 8am-4pm. If you experience any cold or flu symptoms such as cough, fever, chills, shortness of breath, etc. between now and your scheduled surgery, please notify us  at the above number.     Remember:  Do not eat or drink after midnight the night before your surgery    Take these medicines the morning of surgery with A SIP OF WATER  sertraline  (ZOLOFT )    May take these medicines IF NEEDED: albuterol  (VENTOLIN  HFA)- bring inhaler with you on day of surgery ALPRAZolam  (XANAX )    Do not take metFORMIN  (GLUCOPHAGE -XR) the morning of surgery.   One week prior to surgery, STOP taking any Aspirin (unless otherwise instructed by your surgeon) Aleve , Naproxen , Ibuprofen , Motrin , Advil , Goody's, BC's, all herbal medications, fish oil, and non-prescription vitamins.             Do NOT Smoke (Tobacco/Vaping) for 24 hours prior to your procedure.  If you use a CPAP at night, you may bring your mask/headgear for your overnight stay.   You will be asked to remove any contacts, glasses, piercing's, hearing aid's, dentures/partials prior to surgery. Please bring cases for these items if needed.    Patients discharged the day of surgery will not be allowed to drive home, and someone needs to stay with them for 24 hours.  SURGICAL WAITING ROOM VISITATION Patients may have no more than 2 support people in the waiting area - these visitors may rotate.   Pre-op nurse will coordinate an appropriate time for 1 ADULT support person, who may not rotate, to accompany patient in pre-op.  Children under the age of 24 must have an adult with them who is not the patient and must remain in the main waiting  area with an adult.  If the patient needs to stay at the hospital during part of their recovery, the visitor guidelines for inpatient rooms apply.  Please refer to the [facility] website for the visitor guidelines for any additional information.   If you received a COVID test during your pre-op visit  it is requested that you wear a mask when out in public, stay away from anyone that may not be feeling well and notify your surgeon if you develop symptoms. If you have been in contact with anyone that has tested positive in the last 10 days please notify you surgeon.      Pre-operative CHG Bathing Instructions   You can play a key role in reducing the risk of infection after surgery. Your skin needs to be as free of germs as possible. You can reduce the number of germs on your skin by washing with CHG (chlorhexidine  gluconate) soap before surgery. CHG is an antiseptic soap that kills germs and continues to kill germs even after washing.   DO NOT use if you have an allergy to chlorhexidine /CHG or antibacterial soaps. If your skin becomes reddened or irritated, stop using the CHG and notify one of our RNs at [phone].              TAKE A SHOWER THE NIGHT BEFORE SURGERY AND THE DAY OF SURGERY    Please keep in mind the following:  DO NOT shave, including legs and underarms, 48  hours prior to surgery.   You may shave your face before/day of surgery.  Place clean sheets on your bed the night before surgery Use a clean washcloth (not used since being washed) for each shower. DO NOT sleep with pet's night before surgery.  CHG Shower Instructions:  Wash your face and private area with normal soap. If you choose to wash your hair, wash first with your normal shampoo.  After you use shampoo/soap, rinse your hair and body thoroughly to remove shampoo/soap residue.  Turn the water OFF and apply half the bottle of CHG soap to a CLEAN washcloth.  Apply CHG soap ONLY FROM YOUR NECK DOWN TO YOUR  TOES (washing for 3-5 minutes)  DO NOT use CHG soap on face, private areas, open wounds, or sores.  Pay special attention to the area where your surgery is being performed.  If you are having back surgery, having someone wash your back for you may be helpful. Wait 2 minutes after CHG soap is applied, then you may rinse off the CHG soap.  Pat dry with a clean towel  Put on clean pajamas    Additional instructions for the day of surgery: DO NOT APPLY any lotions, deodorants, cologne, or perfumes.   Do not wear jewelry or makeup Do not wear nail polish, gel polish, artificial nails, or any other type of covering on natural nails (fingers and toes) Do not bring valuables to the hospital. [facility] is not responsible for valuables/personal belongings. Put on clean/comfortable clothes.  Please brush your teeth.  Ask your nurse before applying any prescription medications to the skin.

## 2023-09-29 NOTE — Progress Notes (Addendum)
 PCP - Dr. Lavern L. Jodie Cardiologist - Denies  PPM/ICD - Denies Device Orders - n/a Rep Notified - n/a  Chest x-ray - n/a EKG - 09/29/2023 Stress Test - Denies ECHO - 10/02/2022 Cardiac Cath - Denies  Sleep Study - +OSA. Pt wears CPAP when she remembers. Unknown pressure settings  Pt is Pre-DM. She is on metformin  for weight loss.  Last dose of GLP1 agonist- n/a - pt is no longer on Wegovy  due to insurance not covering medication.  GLP1 instructions: Last dose was over a week ago.  Blood Thinner Instructions: n/a Aspirin Instructions: n/a  NPO after midnight  COVID TEST- n/a   Anesthesia review: Yes. EKG review. Hx of demand ischemia in 2015 from PNA/Sepsis.   Patient denies shortness of breath, fever, cough and chest pain at PAT appointment. Pt denies any respiratory illness/infection in the last two months.    All instructions explained to the patient, with a verbal understanding of the material. Patient agrees to go over the instructions while at home for a better understanding. Patient also instructed to self quarantine after being tested for COVID-19. The opportunity to ask questions was provided.

## 2023-09-30 NOTE — Progress Notes (Signed)
 Anesthesia Chart Review:  43 year old female with pertinent history including asthma, PCOS, OSA on CPAP, lower extremity edema, class IV obesity BMI 60.  History of MI/demand ischemia following laparoscopic sleeve gastrectomy in June 2015.  She was admitted for acute respiratory failure (RA O2 sat 50's) 4 days post laparoscpic sleeve gastrectomy and required emergent intubation in the ED by anesthesiologist. Diagnosed with ARDS, diastolic CHF, type 2 NSTEMI (secondary to demand ischemia from respiratory failure/ARDS; No further cardiac workup necessary per cardiologist Dr. Jeffrie on 07/20/13). Diagnostic laparoscopy/EGD done on 07/19/13 which ruled out leak from sleeve gastrectomy. Imaging did not show evidence of PE or DVT. Extubated 07/22/13. Encouraged CPAP compliance (reported CPAP intolerance).   Echo 09/2022 showed hyperdynamic LV function with small gradient across AV, no SAM noted, EF 65 to 70%, normal RV, no significant valvular abnormalities.  Preop labs reviewed, WNL.  EKG 09/29/2023: Normal sinus rhythm.  Rate 72. Right bundle branch block  TTE 10/02/2022: 1. Poor quality study with poor acoustic windows Can consider cardiac MRI  if clinically indicated.   2. Hyperdyanic LV function with small gradient across AV No SAM noted .  Left ventricular ejection fraction, by estimation, is 65 to 70%. The left  ventricle has normal function. The left ventricle has no regional wall  motion abnormalities. Left  ventricular diastolic parameters were normal.   3. Right ventricular systolic function is normal. The right ventricular  size is normal.   4. The mitral valve is normal in structure. No evidence of mitral valve  regurgitation. No evidence of mitral stenosis.   5. The aortic valve is normal in structure. Aortic valve regurgitation is  not visualized. No aortic stenosis is present.   6. The inferior vena cava is normal in size with greater than 50%  respiratory variability, suggesting right  atrial pressure of 3 mmHg.     Lynwood Geofm RIGGERS [facility] Short Stay Center/Anesthesiology Phone [phone] 09/30/2023 3:29 PM

## 2023-09-30 NOTE — Anesthesia Preprocedure Evaluation (Addendum)
 Anesthesia Evaluation  Patient identified by MRN, date of birth, ID band Patient awake    Reviewed: Allergy & Precautions, H&P , NPO status , Patient's Chart, lab work & pertinent test results  Airway Mallampati: III  TM Distance: >3 FB Neck ROM: Full    Dental no notable dental hx.    Pulmonary asthma , sleep apnea    Pulmonary exam normal breath sounds clear to auscultation       Cardiovascular hypertension, + Past MI  Normal cardiovascular exam Rhythm:Regular Rate:Normal     Neuro/Psych  Headaches PSYCHIATRIC DISORDERS Anxiety        GI/Hepatic negative GI ROS, Neg liver ROS,,,  Endo/Other    Class 4 obesity  Renal/GU negative Renal ROS  negative genitourinary   Musculoskeletal  (+) Arthritis ,    Abdominal   Peds negative pediatric ROS (+)  Hematology negative hematology ROS (+)   Anesthesia Other Findings   Reproductive/Obstetrics negative OB ROS                              Anesthesia Physical Anesthesia Plan  ASA: 4  Anesthesia Plan: General   Post-op Pain Management: Tylenol  PO (pre-op)*   Induction: Intravenous  PONV Risk Score and Plan: 3 and Ondansetron , Dexamethasone  and Treatment may vary due to age or medical condition  Airway Management Planned: Oral ETT and Video Laryngoscope Planned  Additional Equipment: None  Intra-op Plan:   Post-operative Plan: Extubation in OR  Informed Consent: I have reviewed the patients History and Physical, chart, labs and discussed the procedure including the risks, benefits and alternatives for the proposed anesthesia with the patient or authorized representative who has indicated his/her understanding and acceptance.     Dental advisory given  Plan Discussed with: CRNA  Anesthesia Plan Comments: (PAT note by Lynwood Hope, PA-C: 43 year old female with pertinent history including asthma, PCOS, OSA on CPAP, lower extremity  edema, class IV obesity BMI 60.  History of MI/demand ischemia following laparoscopic sleeve gastrectomy in June 2015.  She was admitted for acute respiratory failure (RA O2 sat 50's) 4 days post laparoscpic sleeve gastrectomy and required emergent intubation in the ED by anesthesiologist. Diagnosed with ARDS, diastolic CHF, type 2 NSTEMI (secondary to demand ischemia from respiratory failure/ARDS; No further cardiac workup necessary per cardiologist Dr. Jeffrie on 07/20/13). Diagnostic laparoscopy/EGD done on 07/19/13 which ruled out leak from sleeve gastrectomy. Imaging did not show evidence of PE or DVT. Extubated 07/22/13. Encouraged CPAP compliance (reported CPAP intolerance).   Echo 09/2022 showed hyperdynamic LV function with small gradient across AV, no SAM noted, EF 65 to 70%, normal RV, no significant valvular abnormalities.  Preop labs reviewed, WNL.  EKG 09/29/2023: Normal sinus rhythm.  Rate 72. Right bundle branch block  TTE 10/02/2022: 1. Poor quality study with poor acoustic windows Can consider cardiac MRI  if clinically indicated.   2. Hyperdyanic LV function with small gradient across AV No SAM noted .  Left ventricular ejection fraction, by estimation, is 65 to 70%. The left  ventricle has normal function. The left ventricle has no regional wall  motion abnormalities. Left  ventricular diastolic parameters were normal.   3. Right ventricular systolic function is normal. The right ventricular  size is normal.   4. The mitral valve is normal in structure. No evidence of mitral valve  regurgitation. No evidence of mitral stenosis.   5. The aortic valve is normal in structure. Aortic valve  regurgitation is  not visualized. No aortic stenosis is present.   6. The inferior vena cava is normal in size with greater than 50%  respiratory variability, suggesting right atrial pressure of 3 mmHg.    )         Anesthesia Quick Evaluation

## 2023-10-02 ENCOUNTER — Ambulatory Visit (HOSPITAL_COMMUNITY): Payer: Self-pay | Admitting: Physician Assistant

## 2023-10-02 ENCOUNTER — Encounter (HOSPITAL_COMMUNITY): Payer: Self-pay | Admitting: General Surgery

## 2023-10-02 ENCOUNTER — Other Ambulatory Visit: Payer: Self-pay

## 2023-10-02 ENCOUNTER — Encounter (HOSPITAL_COMMUNITY)
Admission: RE | Disposition: A | Discharge status: Home / Self Care | Payer: Self-pay | Source: Home / Self Care | Attending: General Surgery

## 2023-10-02 ENCOUNTER — Ambulatory Visit (HOSPITAL_COMMUNITY)
Admission: RE | Admit: 2023-10-02 | Discharge: 2023-10-02 | Disposition: A | Discharge status: Home / Self Care | Attending: General Surgery | Admitting: General Surgery

## 2023-10-02 ENCOUNTER — Ambulatory Visit (HOSPITAL_BASED_OUTPATIENT_CLINIC_OR_DEPARTMENT_OTHER)

## 2023-10-02 DIAGNOSIS — F419 Anxiety disorder, unspecified: Secondary | ICD-10-CM | POA: Insufficient documentation

## 2023-10-02 DIAGNOSIS — I252 Old myocardial infarction: Secondary | ICD-10-CM | POA: Diagnosis not present

## 2023-10-02 DIAGNOSIS — G4730 Sleep apnea, unspecified: Secondary | ICD-10-CM | POA: Insufficient documentation

## 2023-10-02 DIAGNOSIS — Z6841 Body Mass Index (BMI) 40.0 and over, adult: Secondary | ICD-10-CM | POA: Insufficient documentation

## 2023-10-02 DIAGNOSIS — E6689 Other obesity not elsewhere classified: Secondary | ICD-10-CM | POA: Diagnosis not present

## 2023-10-02 DIAGNOSIS — D171 Benign lipomatous neoplasm of skin and subcutaneous tissue of trunk: Secondary | ICD-10-CM | POA: Insufficient documentation

## 2023-10-02 DIAGNOSIS — I10 Essential (primary) hypertension: Secondary | ICD-10-CM | POA: Insufficient documentation

## 2023-10-02 DIAGNOSIS — G8929 Other chronic pain: Secondary | ICD-10-CM | POA: Insufficient documentation

## 2023-10-02 DIAGNOSIS — M549 Dorsalgia, unspecified: Secondary | ICD-10-CM | POA: Diagnosis not present

## 2023-10-02 DIAGNOSIS — J45909 Unspecified asthma, uncomplicated: Secondary | ICD-10-CM | POA: Insufficient documentation

## 2023-10-02 DIAGNOSIS — M1990 Unspecified osteoarthritis, unspecified site: Secondary | ICD-10-CM | POA: Insufficient documentation

## 2023-10-02 HISTORY — PX: EXCISION MASS, BACK: SHX7560

## 2023-10-02 SURGERY — EXCISION MASS, BACK
Anesthesia: General

## 2023-10-02 MED ORDER — ACETAMINOPHEN 10 MG/ML IV SOLN: 1000.0000 mg | Freq: Once | INTRAVENOUS | Status: DC | PRN

## 2023-10-02 MED ORDER — PROPOFOL 10 MG/ML IV BOLUS
INTRAVENOUS | Status: DC | PRN
  Administered 2023-10-02: 200 mg via INTRAVENOUS

## 2023-10-02 MED ORDER — LACTATED RINGERS IV SOLN: INTRAVENOUS | Status: DC

## 2023-10-02 MED ORDER — ORAL CARE MOUTH RINSE: 15.0000 mL | Freq: Once | OROMUCOSAL | Status: AC

## 2023-10-02 MED ORDER — DROPERIDOL 2.5 MG/ML IJ SOLN: 0.6250 mg | Freq: Once | INTRAMUSCULAR | Status: DC | PRN

## 2023-10-02 MED ORDER — FENTANYL CITRATE (PF) 250 MCG/5ML IJ SOLN
INTRAMUSCULAR | Status: DC | PRN
  Administered 2023-10-02: 100 ug via INTRAVENOUS
  Administered 2023-10-02: 50 ug via INTRAVENOUS

## 2023-10-02 MED ORDER — PHENYLEPHRINE 80 MCG/ML (10ML) SYRINGE FOR IV PUSH (FOR BLOOD PRESSURE SUPPORT)
PREFILLED_SYRINGE | INTRAVENOUS | Status: AC
  Filled 2023-10-02: qty 10

## 2023-10-02 MED ORDER — OXYCODONE HCL 5 MG PO TABS: 5.0000 mg | ORAL_TABLET | Freq: Four times a day (QID) | ORAL | 0 refills | Status: DC | PRN

## 2023-10-02 MED ORDER — DEXAMETHASONE SODIUM PHOSPHATE 10 MG/ML IJ SOLN
INTRAMUSCULAR | Status: AC
  Filled 2023-10-02: qty 1

## 2023-10-02 MED ORDER — CEFAZOLIN SODIUM-DEXTROSE 3-4 GM/150ML-% IV SOLN
3.0000 g | INTRAVENOUS | Status: AC
  Administered 2023-10-02: 3 g via INTRAVENOUS
  Filled 2023-10-02: qty 150

## 2023-10-02 MED ORDER — DEXAMETHASONE SODIUM PHOSPHATE 10 MG/ML IJ SOLN
INTRAMUSCULAR | Status: DC | PRN
  Administered 2023-10-02: 10 mg via INTRAVENOUS

## 2023-10-02 MED ORDER — LIDOCAINE 2% (20 MG/ML) 5 ML SYRINGE
INTRAMUSCULAR | Status: DC | PRN
  Administered 2023-10-02: 80 mg via INTRAVENOUS

## 2023-10-02 MED ORDER — CHLORHEXIDINE GLUCONATE 0.12 % MT SOLN
15.0000 mL | Freq: Once | OROMUCOSAL | Status: AC
  Administered 2023-10-02: 15 mL via OROMUCOSAL
  Filled 2023-10-02: qty 15

## 2023-10-02 MED ORDER — ROCURONIUM BROMIDE 10 MG/ML (PF) SYRINGE
PREFILLED_SYRINGE | INTRAVENOUS | Status: AC
  Filled 2023-10-02: qty 10

## 2023-10-02 MED ORDER — BUPIVACAINE-EPINEPHRINE (PF) 0.25% -1:200000 IJ SOLN
INTRAMUSCULAR | Status: AC
  Filled 2023-10-02: qty 30

## 2023-10-02 MED ORDER — FENTANYL CITRATE (PF) 100 MCG/2ML IJ SOLN: 25.0000 ug | INTRAMUSCULAR | Status: DC | PRN

## 2023-10-02 MED ORDER — 0.9 % SODIUM CHLORIDE (POUR BTL) OPTIME
TOPICAL | Status: DC | PRN
Start: 2023-10-02 — End: 2023-10-02
  Administered 2023-10-02: 1000 mL

## 2023-10-02 MED ORDER — FENTANYL CITRATE (PF) 250 MCG/5ML IJ SOLN
INTRAMUSCULAR | Status: AC
  Filled 2023-10-02: qty 5

## 2023-10-02 MED ORDER — ESMOLOL HCL 100 MG/10ML IV SOLN
INTRAVENOUS | Status: DC | PRN
Start: 2023-10-02 — End: 2023-10-02
  Administered 2023-10-02: 20 mg via INTRAVENOUS

## 2023-10-02 MED ORDER — SUGAMMADEX SODIUM 200 MG/2ML IV SOLN
INTRAVENOUS | Status: DC | PRN
  Administered 2023-10-02: 400 mg via INTRAVENOUS

## 2023-10-02 MED ORDER — OXYCODONE HCL 5 MG/5ML PO SOLN: 5.0000 mg | Freq: Once | ORAL | Status: DC | PRN

## 2023-10-02 MED ORDER — PROPOFOL 10 MG/ML IV BOLUS
INTRAVENOUS | Status: AC
  Filled 2023-10-02: qty 20

## 2023-10-02 MED ORDER — CHLORHEXIDINE GLUCONATE CLOTH 2 % EX PADS: 6.0000 | MEDICATED_PAD | Freq: Once | CUTANEOUS | Status: DC

## 2023-10-02 MED ORDER — ACETAMINOPHEN 500 MG PO TABS
1000.0000 mg | ORAL_TABLET | ORAL | Status: AC
  Administered 2023-10-02: 1000 mg via ORAL
  Filled 2023-10-02: qty 2

## 2023-10-02 MED ORDER — MIDAZOLAM HCL 2 MG/2ML IJ SOLN
INTRAMUSCULAR | Status: DC | PRN
  Administered 2023-10-02: 2 mg via INTRAVENOUS

## 2023-10-02 MED ORDER — BUPIVACAINE-EPINEPHRINE (PF) 0.25% -1:200000 IJ SOLN
INTRAMUSCULAR | Status: DC | PRN
  Administered 2023-10-02: 30 mL

## 2023-10-02 MED ORDER — ONDANSETRON HCL 4 MG/2ML IJ SOLN
INTRAMUSCULAR | Status: DC | PRN
  Administered 2023-10-02: 4 mg via INTRAVENOUS

## 2023-10-02 MED ORDER — OXYCODONE HCL 5 MG PO TABS: 5.0000 mg | ORAL_TABLET | Freq: Once | ORAL | Status: DC | PRN

## 2023-10-02 MED ORDER — ONDANSETRON HCL 4 MG/2ML IJ SOLN
INTRAMUSCULAR | Status: AC
  Filled 2023-10-02: qty 2

## 2023-10-02 MED ORDER — SUCCINYLCHOLINE CHLORIDE 200 MG/10ML IV SOSY
PREFILLED_SYRINGE | INTRAVENOUS | Status: AC
  Filled 2023-10-02: qty 10

## 2023-10-02 MED ORDER — GABAPENTIN 300 MG PO CAPS
300.0000 mg | ORAL_CAPSULE | ORAL | Status: AC
  Administered 2023-10-02: 300 mg via ORAL
  Filled 2023-10-02: qty 1

## 2023-10-02 MED ORDER — MIDAZOLAM HCL 2 MG/2ML IJ SOLN
INTRAMUSCULAR | Status: AC
  Filled 2023-10-02: qty 2

## 2023-10-02 MED ORDER — ENOXAPARIN SODIUM 40 MG/0.4ML IJ SOSY
40.0000 mg | PREFILLED_SYRINGE | Freq: Once | INTRAMUSCULAR | Status: AC
  Administered 2023-10-02: 40 mg via SUBCUTANEOUS
  Filled 2023-10-02: qty 0.4

## 2023-10-02 MED ORDER — SUCCINYLCHOLINE CHLORIDE 200 MG/10ML IV SOSY
PREFILLED_SYRINGE | INTRAVENOUS | Status: DC | PRN
  Administered 2023-10-02: 160 mg via INTRAVENOUS

## 2023-10-02 MED ORDER — ROCURONIUM BROMIDE 10 MG/ML (PF) SYRINGE
PREFILLED_SYRINGE | INTRAVENOUS | Status: DC | PRN
  Administered 2023-10-02: 20 mg via INTRAVENOUS
  Administered 2023-10-02: 40 mg via INTRAVENOUS

## 2023-10-02 MED ORDER — LIDOCAINE 2% (20 MG/ML) 5 ML SYRINGE
INTRAMUSCULAR | Status: AC
Start: 2023-10-02 — End: 2023-10-02
  Filled 2023-10-02: qty 5

## 2023-10-02 MED ORDER — EPHEDRINE 5 MG/ML INJ
INTRAVENOUS | Status: AC
  Filled 2023-10-02: qty 5

## 2023-10-02 SURGICAL SUPPLY — 29 items
CANISTER SUCTION 3000ML PPV (SUCTIONS) ×2 IMPLANT
CHLORAPREP W/TINT 26 (MISCELLANEOUS) ×2 IMPLANT
COVER SURGICAL LIGHT HANDLE (MISCELLANEOUS) ×2 IMPLANT
DERMABOND ADVANCED .7 DNX12 (GAUZE/BANDAGES/DRESSINGS) ×2 IMPLANT
DRAPE LAPAROSCOPIC ABDOMINAL (DRAPES) IMPLANT
ELECTRODE REM PT RTRN 9FT ADLT (ELECTROSURGICAL) ×2 IMPLANT
GAUZE 4X4 16PLY ~~LOC~~+RFID DBL (SPONGE) IMPLANT
GAUZE SPONGE 4X4 12PLY STRL (GAUZE/BANDAGES/DRESSINGS) IMPLANT
GLOVE BIO SURGEON STRL SZ 6 (GLOVE) ×2 IMPLANT
GLOVE INDICATOR 6.5 STRL GRN (GLOVE) ×2 IMPLANT
GOWN STRL REUS W/ TWL LRG LVL3 (GOWN DISPOSABLE) ×2 IMPLANT
KIT BASIN OR (CUSTOM PROCEDURE TRAY) ×2 IMPLANT
KIT TURNOVER KIT B (KITS) ×2 IMPLANT
MARKER SKIN DUAL TIP RULER LAB (MISCELLANEOUS) ×2 IMPLANT
NDL 22X1.5 STRL (OR ONLY) (MISCELLANEOUS) ×2 IMPLANT
NDL HYPO 22X1.5 SAFETY MO (MISCELLANEOUS) IMPLANT
NEEDLE 22X1.5 STRL (OR ONLY) (MISCELLANEOUS) ×1 IMPLANT
NEEDLE HYPO 22X1.5 SAFETY MO (MISCELLANEOUS) ×1 IMPLANT
NS IRRIG 1000ML POUR BTL (IV SOLUTION) ×2 IMPLANT
PACK GENERAL/GYN (CUSTOM PROCEDURE TRAY) ×2 IMPLANT
PAD ARMBOARD POSITIONER FOAM (MISCELLANEOUS) ×4 IMPLANT
POSITIONER FOAM HEAD TRAC HOLE (MISCELLANEOUS) IMPLANT
SUT MON AB 4-0 PC3 18 (SUTURE) ×2 IMPLANT
SUT SILK 2 0 PERMA HAND 18 BK (SUTURE) IMPLANT
SUT VIC AB 3-0 SH 18 (SUTURE) IMPLANT
SUT VIC AB 3-0 SH 27X BRD (SUTURE) ×2 IMPLANT
SUT VIC AB 3-0 SH 8-18 (SUTURE) IMPLANT
SYR CONTROL 10ML LL (SYRINGE) ×2 IMPLANT
TOWEL GREEN STERILE FF (TOWEL DISPOSABLE) ×2 IMPLANT

## 2023-10-02 NOTE — H&P (Signed)
 HPI  Melissa Erickson is an 43 y.o. female who was seen in clinic on 09/08/23 for back lipoma.  Patient has had for many years. Not sure if it has grown in size. Does not typically bother her, only in the summer it sometimes forms blisters or irritation over the skin as it rubs against her clothing and this is why she would like it removed. Of note, has chronic back pain.   10 point review of systems is negative except as listed above in HPI.  Objective  Past Medical History: Past Medical History:  Diagnosis Date   Allergy    Asthma    Triggered with seasonal allergies   Back pain    Chicken pox    Edema of both lower legs    Environmental allergies    Seasonal   Essential hypertension 07/18/2013   Isolated incident. On no medications as of 2025   Gallstones    Heart attack (HCC)    NSTEMI 07/2013, demand ischemia in setting of ARDS, post-gastric sleeve    History of congestive heart failure 2015   History of pneumonia    Joint pain    Leiomyoma of uterus, unspecified 07/26/2019   Migraine    None in the past couple years as of 2025 - treated without medication   Pneumonia 2015   Polycystic ovarian syndrome    Pre-diabetes    Sleep apnea    SOB (shortness of breath)    Suppurative hidradenitis 07/21/2017   Ventral hernia    Vitamin D  deficiency     Past Surgical History: Past Surgical History:  Procedure Laterality Date   BREAST BIOPSY Right 2001   Normal   CESAREAN SECTION  2007   CHOLECYSTECTOMY  2007   ESOPHAGOGASTRODUODENOSCOPY N/A 07/19/2013   Procedure: ESOPHAGOGASTRODUODENOSCOPY (EGD);  Surgeon: Vicenta DELENA Poli, MD;  Location: [facility] OR;  Service: General;  Laterality: N/A;   HYSTERECTOMY ABDOMINAL WITH SALPINGECTOMY N/A 07/26/2019   Procedure: Hysterectomy Abdominal With Salpingectomy;  Surgeon: Bettina Muskrat, MD;  Location: [facility] OR;  Service: Gynecology;  Laterality: N/A;   LAPAROSCOPIC GASTRIC SLEEVE RESECTION  07/26/2013   LAPAROSCOPY N/A 07/19/2013    Procedure: LAPAROSCOPY DIAGNOSTIC;  Surgeon: Vicenta DELENA Poli, MD;  Location: [facility] OR;  Service: General;  Laterality: N/A;   TOTAL LAPAROSCOPIC HYSTERECTOMY WITH SALPINGECTOMY Bilateral 07/26/2019   Procedure: Attempted TOTAL LAPAROSCOPIC HYSTERECTOMY WITH SALPINGECTOMY;  Surgeon: Bettina Muskrat, MD;  Location: [facility] OR;  Service: Gynecology;  Laterality: Bilateral;    Family History:  Family History  Problem Relation Age of Onset   Diabetes Mother    Stroke Mother    Miscarriages / [place] Mother    Sarcoidosis Father    Alcohol  abuse Father    Early death Father    Healthy Brother    Alzheimer's disease Maternal Grandmother    Heart attack Maternal Grandmother    Diabetes Maternal Grandfather    COPD Maternal Grandfather    Cancer Maternal Grandfather    Cancer Paternal Grandmother    Cerebral aneurysm Paternal Grandmother    Early death Paternal Grandmother    Cancer Paternal Grandfather    Allergic rhinitis Daughter     Social History:  reports that she has never smoked. She has never used smokeless tobacco. She reports current alcohol  use. She reports that she does not use drugs.  Allergies: No Known Allergies  Medications: I have reviewed the patient's current medications.  Labs: Pertinent lab work personally reviewed.  Imaging: Pertinent imaging personally reviewed  Physical Exam Last menstrual period 07/22/2019. General: No acute distress, well appearing, morbidly obese HEENT: PERRL, hearing grossly normal, mucous membranes moist CV: Regular rate and rhythm Pulm: Normal work of breathing on room air Back: ~12x7cm lipoma to left back Extremities: Warm and well perfused Neuro: A&O x4, no focal neurologic deficits Psych: Appropriate mood and effect     Assessment   Melissa Erickson is an 43 y.o. female with back lipoma.  Plan  - Will proceed with excision of back lipoma - We discussed risks of surgery including but not limited to: bleeding, infection, post op  seroma, post-op hematoma, damage to surrounding structures, recurrence, pain. Patient voiced understanding and wishes to proceed with surgery.    Orie Silversmith, MD [facility] Surgery

## 2023-10-02 NOTE — Op Note (Signed)
 EXCISION MASS, BACK  Operative Note (CSN: [id number])  Service  Date of Surgery: 10/02/2023 Admit Date: 10/02/2023 Performing Service: General Surgeons and Role:    DEWAINE Ann Fine, MD - Primary  Op Note Pre-op Diagnosis: LIPOMA Post-op Diagnosis: Post-Op Diagnosis Codes:    * Lipoma of back [D17.1]  Procedure(s): EXCISION MASS, BACK  Findings: 10x5x7cm lipoma  Anesthesia: General Estimated Blood Loss: 25cc  Complications: None Specimens:  ID Type Source Tests Collected by Time Destination  1 : Back Lipoma Tissue PATH Soft tissue SURGICAL PATHOLOGY Ann Fine, MD 10/02/2023 1220     Procedure Details  Prior to the procedure, the risks, benefits, complications, treatment options, and expected outcomes were discussed with the patient and/or family, including but not limited to, the risks of bleeding, infection, post-op seroma, post-op hematoma, wound dehiscence, and recurrence.SABRA  Despite the risks, the patient has given informed consent for operative intervention.  The patient was taken to the Operating Room, identified as Melissa Erickson and the procedure verified as excision of back lipoma.  Identification pause was held and the above information confirmed.  General endotracheal anesthesia was induced. The patient was then placed in the prone position. The back was prepped with chloraprep and draped in the typical sterile fashion.  A formal preincision time out was performed.  An elliptical incision was made overlying the palpable lesion extending approximately 10cm using a #15 blade. The skin of the ellipsis was excised. Electrocautery was use for dissection of lipoma from surrounding tissues and lipoma then divided at posterior attachments. Lipoma removed and sent for pathology. Measured approximately 10x5x7cm. Wound bed was irrigated and hemostasis confirmed. There was excessive skin overlap due to the size of the defect so this excess skin was excised using #15 blade and  electrocautery. Wound bed irrigated again and hemostasis again confirmed. Wound closed in layers using 3-0 interrupted vicryls in subcutaneous tissue, interrupted deep dermals with 3-0 vicryl. Skin closed with 4-0 monocryl and dermabond.  Instrument, sponge, and needle counts were correct at the conclusion of the case.   [name], MD [facility] Surgery Date: 10/02/2023  Time: 1:09 PM

## 2023-10-02 NOTE — Anesthesia Postprocedure Evaluation (Signed)
 Anesthesia Post Note  Patient: Melissa Erickson  Procedure(s) Performed: EXCISION MASS, BACK     Patient location during evaluation: PACU Anesthesia Type: General Level of consciousness: awake and alert Pain management: pain level controlled Vital Signs Assessment: post-procedure vital signs reviewed and stable Respiratory status: spontaneous breathing, nonlabored ventilation, respiratory function stable and patient connected to nasal cannula oxygen Cardiovascular status: blood pressure returned to baseline and stable Postop Assessment: no apparent nausea or vomiting Anesthetic complications: no   No notable events documented.  Last Vitals:  Vitals:   10/02/23 1430 10/02/23 1445  BP: (!) 148/89 133/64  Pulse: 89 89  Resp: (!) 24 (!) 21  Temp:    SpO2: 96% 99%    Last Pain:  Vitals:   10/02/23 1445  TempSrc:   PainSc: 0-No pain                 Thom JONELLE Peoples

## 2023-10-02 NOTE — Anesthesia Procedure Notes (Signed)
 Procedure Name: Intubation Date/Time: 10/02/2023 11:42 AM  Performed by: Hedy Jarred, CRNAPre-anesthesia Checklist: Patient identified, Emergency Drugs available, Suction available, Patient being monitored and Timeout performed Patient Re-evaluated:Patient Re-evaluated prior to induction Oxygen Delivery Method: Circle system utilized Preoxygenation: Pre-oxygenation with 100% oxygen Induction Type: IV induction and Rapid sequence Ventilation: Mask ventilation without difficulty Laryngoscope Size: Glidescope and 3 Grade View: Grade I Tube type: Subglottic suction tube Tube size: 7.5 mm Number of attempts: 1 Airway Equipment and Method: Stylet and Video-laryngoscopy Placement Confirmation: ETT inserted through vocal cords under direct vision, breath sounds checked- equal and bilateral and CO2 detector Secured at: 22 cm Tube secured with: Tape Dental Injury: Teeth and Oropharynx as per pre-operative assessment

## 2023-10-02 NOTE — Transfer of Care (Signed)
 Immediate Anesthesia Transfer of Care Note  Patient: Melissa Erickson  Procedure(s) Performed: EXCISION MASS, BACK  Patient Location: PACU  Anesthesia Type:General  Level of Consciousness: drowsy  Airway & Oxygen Therapy: Patient Spontanous Breathing and Patient connected to face mask oxygen  Post-op Assessment: Report given to RN and Post -op Vital signs reviewed and stable  Post vital signs: Reviewed and stable  Last Vitals:  Vitals Value Taken Time  BP 164/116 10/02/23 13:30  Temp    Pulse 90 10/02/23 13:31  Resp 19 10/02/23 13:31  SpO2 94 % 10/02/23 13:31  Vitals shown include unfiled device data.  Last Pain:  Vitals:   10/02/23 0932  TempSrc:   PainSc: 0-No pain         Complications: No notable events documented.

## 2023-10-03 ENCOUNTER — Encounter (HOSPITAL_COMMUNITY): Payer: Self-pay | Admitting: General Surgery

## 2023-10-04 ENCOUNTER — Emergency Department (HOSPITAL_BASED_OUTPATIENT_CLINIC_OR_DEPARTMENT_OTHER)
Admission: EM | Admit: 2023-10-04 | Discharge: 2023-10-05 | Disposition: A | Discharge status: Home / Self Care | Attending: Emergency Medicine | Admitting: Emergency Medicine

## 2023-10-04 ENCOUNTER — Other Ambulatory Visit: Payer: Self-pay

## 2023-10-04 DIAGNOSIS — L7633 Postprocedural seroma of skin and subcutaneous tissue following a dermatologic procedure: Secondary | ICD-10-CM | POA: Insufficient documentation

## 2023-10-04 LAB — CBC WITH DIFFERENTIAL/PLATELET
Abs Immature Granulocytes: 0.03 K/uL (ref 0.00–0.07)
Basophils Absolute: 0.1 K/uL (ref 0.0–0.1)
Basophils Relative: 1 %
Eosinophils Absolute: 0.2 K/uL (ref 0.0–0.5)
Eosinophils Relative: 2 %
HCT: 41.7 % (ref 36.0–46.0)
Hemoglobin: 13.6 g/dL (ref 12.0–15.0)
Immature Granulocytes: 0 %
Lymphocytes Relative: 25 %
Lymphs Abs: 2.3 K/uL (ref 0.7–4.0)
MCH: 28 pg (ref 26.0–34.0)
MCHC: 32.6 g/dL (ref 30.0–36.0)
MCV: 86 fL (ref 80.0–100.0)
Monocytes Absolute: 0.6 K/uL (ref 0.1–1.0)
Monocytes Relative: 7 %
Neutro Abs: 6.1 K/uL (ref 1.7–7.7)
Neutrophils Relative %: 65 %
Platelets: 236 K/uL (ref 150–400)
RBC: 4.85 MIL/uL (ref 3.87–5.11)
RDW: 13.9 % (ref 11.5–15.5)
WBC: 9.3 K/uL (ref 4.0–10.5)
nRBC: 0 % (ref 0.0–0.2)

## 2023-10-04 LAB — COMPREHENSIVE METABOLIC PANEL WITH GFR
ALT: 22 U/L (ref 0–44)
AST: 21 U/L (ref 15–41)
Albumin: 3.8 g/dL (ref 3.5–5.0)
Alkaline Phosphatase: 73 U/L (ref 38–126)
Anion gap: 13 (ref 5–15)
BUN: 12 mg/dL (ref 6–20)
CO2: 23 mmol/L (ref 22–32)
Calcium: 9.4 mg/dL (ref 8.9–10.3)
Chloride: 104 mmol/L (ref 98–111)
Creatinine, Ser: 0.85 mg/dL (ref 0.44–1.00)
GFR, Estimated: >60 mL/min (ref >60)
Glucose, Bld: 125 mg/dL — ABNORMAL HIGH (ref 70–99)
Potassium: 3.7 mmol/L (ref 3.5–5.1)
Sodium: 139 mmol/L (ref 135–145)
Total Bilirubin: 0.4 mg/dL (ref 0.0–1.2)
Total Protein: 7.4 g/dL (ref 6.5–8.1)

## 2023-10-04 NOTE — ED Triage Notes (Signed)
 Pt POV reporting fatigue, dizziness and hot flashes today, lipoma removed 8/15, seen yesterday at [facility] for wound check, mild dehiscence noted, no erythema or warmth, told to f/u with surgery center for wound to be closed. Afebrile.

## 2023-10-04 NOTE — ED Provider Notes (Signed)
 [place] EMERGENCY DEPARTMENT AT [facility] Provider Note   CSN: [id number] Arrival date & time: 10/04/23  2032     Patient presents with: Fatigue and Dizziness   Melissa Erickson is a 43 y.o. female.  {Add pertinent medical, surgical, social history, OB history to HPI:32947}  Dizziness      Prior to Admission medications   Medication Sig Start Date End Date Taking? Authorizing Provider  albuterol  (VENTOLIN  HFA) 108 (90 Base) MCG/ACT inhaler Inhale 2 puffs into the lungs every 4 (four) hours as needed for wheezing or shortness of breath. 04/15/23   Jodie Lavern CROME, MD  ALPRAZolam  (XANAX ) 0.5 MG tablet Take 1 tablet (0.5 mg total) by mouth daily as needed for anxiety. 08/10/23   Jodie Lavern CROME, MD  metFORMIN  (GLUCOPHAGE -XR) 500 MG 24 hr tablet Take 1 tablet (500 mg total) by mouth 2 (two) times daily with a meal. 09/07/23   Francyne Romano, MD  oxyCODONE  (ROXICODONE ) 5 MG immediate release tablet Take 1 tablet (5 mg total) by mouth every 6 (six) hours as needed. 10/02/23   Ann Fine, MD  Semaglutide -Weight Management (WEGOVY ) 0.25 MG/0.5ML SOAJ Inject 0.25 mg into the skin once a week. Patient not taking: Reported on 09/24/2023 09/21/23   Francyne Romano, MD  sertraline  (ZOLOFT ) 100 MG tablet Take 1 tablet (100 mg total) by mouth daily. 09/30/22   Jodie Lavern CROME, MD    Allergies: Patient has no known allergies.    Review of Systems  Neurological:  Positive for dizziness.    Updated Vital Signs BP 120/76   Pulse 77   Temp 98.2 F (36.8 C) (Oral)   Resp (!) 25   Ht 5' 5 (1.651 m)   Wt (!) 165.1 kg   LMP 07/22/2019   SpO2 92%   BMI 60.57 kg/m   Physical Exam  (all labs ordered are listed, but only abnormal results are displayed) Labs Reviewed  COMPREHENSIVE METABOLIC PANEL WITH GFR - Abnormal; Notable for the following components:      Result Value   Glucose, Bld 125 (*)    All other components within normal limits  CBC WITH DIFFERENTIAL/PLATELET     EKG: EKG Interpretation Date/Time:  Sunday October 04 2023 20:48:57 EDT Ventricular Rate:  89 PR Interval:  140 QRS Duration:  136 QT Interval:  400 QTC Calculation: 487 R Axis:   258  Text Interpretation: Sinus rhythm RBBB and LAFB Confirmed by Ruthe Cornet [phone]) on 10/04/2023 8:51:49 PM  Radiology: No results found.  {Document cardiac monitor, telemetry assessment procedure when appropriate:32947} Procedures   Medications Ordered in the ED - No data to display  Clinical Course as of 10/04/23 2344  Sun Oct 04, 2023  2256 Info given to Dr. Dasie furlough si currently in the OR and will call back. Coleen with [facility]  [RR]    Clinical Course User Index [RR] Bernis Ernst, PA-C   {Click here for ABCD2, HEART and other calculators REFRESH Note before signing:1}                              Medical Decision Making Amount and/or Complexity of Data Reviewed Labs: ordered.   ***  {Document critical care time when appropriate  Document review of labs and clinical decision tools ie CHADS2VASC2, etc  Document your independent review of radiology images and any outside records  Document your discussion with family members, caretakers and with consultants  Document social determinants  of health affecting pt's care  Document your decision making why or why not admission, treatments were needed:32947:::1}   Final diagnoses:  None    ED Discharge Orders     None

## 2023-10-05 LAB — SURGICAL PATHOLOGY

## 2023-10-05 NOTE — Discharge Instructions (Addendum)
 You were seen in the emergency department today for evaluation of your postop incision.  After speaking with general surgery who has looked at the wounds, they do appear this is well-healing.  They will reach out to you to schedule an appointment next week for wound check.  They recommend keeping dry gauze to the area and still continue with your postop instructions.  I include additional information to the discharge paperwork for you to review.  If you have any concerns, new or worsening symptoms, please return your nearest emerged part for evaluation.  Contact a health care provider if: You have a fever. You have redness or pain at the site of your seroma. Your seroma is more swollen or is getting bigger. You have more fluid coming from your seroma. Your seroma feels warm to the touch. You have pus or a bad smell coming from your seroma. Get help right away if: You have a fever along with severe pain, redness at the site, or chills. You feel confused or have trouble staying awake. You feel like your heart is beating very fast. You feel short of breath or are breathing fast. You have cool, clammy, or sweaty skin.

## 2023-10-06 ENCOUNTER — Ambulatory Visit (INDEPENDENT_AMBULATORY_CARE_PROVIDER_SITE_OTHER): Admitting: Internal Medicine

## 2023-10-08 ENCOUNTER — Telehealth (INDEPENDENT_AMBULATORY_CARE_PROVIDER_SITE_OTHER): Payer: Self-pay | Admitting: *Deleted

## 2023-10-08 ENCOUNTER — Ambulatory Visit (INDEPENDENT_AMBULATORY_CARE_PROVIDER_SITE_OTHER): Admitting: Internal Medicine

## 2023-10-08 NOTE — Telephone Encounter (Signed)
 Called patient to inform her after speaking with insurance company, Wegovy  or [name]  is covered but the copayment will be around a $1000. The insurance plan she has will not allow a tier exemption.

## 2023-10-10 ENCOUNTER — Other Ambulatory Visit (INDEPENDENT_AMBULATORY_CARE_PROVIDER_SITE_OTHER): Payer: Self-pay | Admitting: Internal Medicine

## 2023-10-10 DIAGNOSIS — R7303 Prediabetes: Secondary | ICD-10-CM

## 2023-10-11 ENCOUNTER — Emergency Department (HOSPITAL_COMMUNITY)
Admission: EM | Admit: 2023-10-11 | Discharge: 2023-10-12 | Disposition: A | Discharge status: Home / Self Care | Attending: Emergency Medicine | Admitting: Emergency Medicine

## 2023-10-11 ENCOUNTER — Encounter (HOSPITAL_COMMUNITY): Payer: Self-pay

## 2023-10-11 ENCOUNTER — Other Ambulatory Visit: Payer: Self-pay

## 2023-10-11 DIAGNOSIS — T8131XA Disruption of external operation (surgical) wound, not elsewhere classified, initial encounter: Secondary | ICD-10-CM | POA: Diagnosis not present

## 2023-10-11 DIAGNOSIS — Z7984 Long term (current) use of oral hypoglycemic drugs: Secondary | ICD-10-CM | POA: Diagnosis not present

## 2023-10-11 DIAGNOSIS — I10 Essential (primary) hypertension: Secondary | ICD-10-CM | POA: Insufficient documentation

## 2023-10-11 DIAGNOSIS — T8130XA Disruption of wound, unspecified, initial encounter: Secondary | ICD-10-CM

## 2023-10-11 DIAGNOSIS — R42 Dizziness and giddiness: Secondary | ICD-10-CM | POA: Insufficient documentation

## 2023-10-11 DIAGNOSIS — R791 Abnormal coagulation profile: Secondary | ICD-10-CM | POA: Diagnosis not present

## 2023-10-11 DIAGNOSIS — Y828 Other medical devices associated with adverse incidents: Secondary | ICD-10-CM | POA: Diagnosis not present

## 2023-10-11 LAB — CBC WITH DIFFERENTIAL/PLATELET
Abs Immature Granulocytes: 0.03 K/uL (ref 0.00–0.07)
Basophils Absolute: 0.1 K/uL (ref 0.0–0.1)
Basophils Relative: 1 %
Eosinophils Absolute: 0.2 K/uL (ref 0.0–0.5)
Eosinophils Relative: 2 %
HCT: 44.2 % (ref 36.0–46.0)
Hemoglobin: 13.8 g/dL (ref 12.0–15.0)
Immature Granulocytes: 0 %
Lymphocytes Relative: 29 %
Lymphs Abs: 2.3 K/uL (ref 0.7–4.0)
MCH: 27.2 pg (ref 26.0–34.0)
MCHC: 31.2 g/dL (ref 30.0–36.0)
MCV: 87 fL (ref 80.0–100.0)
Monocytes Absolute: 0.6 K/uL (ref 0.1–1.0)
Monocytes Relative: 7 %
Neutro Abs: 4.8 K/uL (ref 1.7–7.7)
Neutrophils Relative %: 61 %
Platelets: 219 K/uL (ref 150–400)
RBC: 5.08 MIL/uL (ref 3.87–5.11)
RDW: 13.5 % (ref 11.5–15.5)
WBC: 7.8 K/uL (ref 4.0–10.5)
nRBC: 0 % (ref 0.0–0.2)

## 2023-10-11 LAB — BASIC METABOLIC PANEL WITH GFR
Anion gap: 11 (ref 5–15)
BUN: 10 mg/dL (ref 6–20)
CO2: 24 mmol/L (ref 22–32)
Calcium: 9 mg/dL (ref 8.9–10.3)
Chloride: 102 mmol/L (ref 98–111)
Creatinine, Ser: 0.8 mg/dL (ref 0.44–1.00)
GFR, Estimated: >60 mL/min (ref >60)
Glucose, Bld: 95 mg/dL (ref 70–99)
Potassium: 4 mmol/L (ref 3.5–5.1)
Sodium: 137 mmol/L (ref 135–145)

## 2023-10-11 LAB — D-DIMER, QUANTITATIVE: D-Dimer, Quant: 2.36 ug{FEU}/mL — ABNORMAL HIGH (ref 0.00–0.50)

## 2023-10-11 NOTE — ED Triage Notes (Signed)
 Pt coming in after back surgery on 8/25. Pt reports that her surgical site on her back was healing well but now it feels like the glue has popped and the site began oozing again. Pt also reporting dizzyness

## 2023-10-11 NOTE — ED Provider Notes (Signed)
 [place] EMERGENCY DEPARTMENT AT [facility] Provider Note   CSN: [id number] Arrival date & time: 10/11/23  8057     Patient presents with: Post-op Problem and Dizziness   Melissa Erickson is a 43 y.o. female who had a lipoma removal on 8/15 from her back.  Patient reports that her surgical site is oozing.  Patient also reporting dizziness with standing that resolves after approximately a minute.  Patient denies fever, chills, nausea, vomiting, chest pain, shortness of breath, abdominal pain, vision changes, diplopia, any other complaints at this time.  {Add pertinent medical, surgical, social history, OB history to HPI:32947}  Dizziness      Prior to Admission medications   Medication Sig Start Date End Date Taking? Authorizing Provider  albuterol  (VENTOLIN  HFA) 108 (90 Base) MCG/ACT inhaler Inhale 2 puffs into the lungs every 4 (four) hours as needed for wheezing or shortness of breath. 04/15/23   Jodie Lavern CROME, MD  ALPRAZolam  (XANAX ) 0.5 MG tablet Take 1 tablet (0.5 mg total) by mouth daily as needed for anxiety. 08/10/23   Jodie Lavern CROME, MD  metFORMIN  (GLUCOPHAGE -XR) 500 MG 24 hr tablet Take 1 tablet (500 mg total) by mouth 2 (two) times daily with a meal. 09/07/23   Francyne Romano, MD  oxyCODONE  (ROXICODONE ) 5 MG immediate release tablet Take 1 tablet (5 mg total) by mouth every 6 (six) hours as needed. 10/02/23   Ann Fine, MD  Semaglutide -Weight Management (WEGOVY ) 0.25 MG/0.5ML SOAJ Inject 0.25 mg into the skin once a week. Patient not taking: Reported on 09/24/2023 09/21/23   Francyne Romano, MD  sertraline  (ZOLOFT ) 100 MG tablet Take 1 tablet (100 mg total) by mouth daily. 09/30/22   Jodie Lavern CROME, MD    Allergies: Patient has no known allergies.    Review of Systems  Skin:  Positive for wound.  Neurological:  Positive for dizziness.    Updated Vital Signs BP (!) 146/91 (BP Location: Right Arm)   Pulse 87   Temp 98.6 F (37 C)   Resp 16   Ht  5' 5 (1.651 m)   Wt (!) 163.3 kg   LMP 07/22/2019   SpO2 98%   BMI 59.91 kg/m   Physical Exam Vitals and nursing note reviewed.  Constitutional:      General: She is not in acute distress.    Appearance: She is well-developed. She is obese. She is not ill-appearing or diaphoretic.  HENT:     Head: Normocephalic and atraumatic.     Right Ear: External ear normal.     Left Ear: External ear normal.  Eyes:     Conjunctiva/sclera: Conjunctivae normal.  Cardiovascular:     Rate and Rhythm: Normal rate and regular rhythm.     Heart sounds: No murmur heard. Pulmonary:     Effort: Pulmonary effort is normal. No respiratory distress.     Breath sounds: Normal breath sounds.  Abdominal:     Palpations: Abdomen is soft.     Tenderness: There is no abdominal tenderness.  Musculoskeletal:        General: No swelling.     Cervical back: Neck supple.  Skin:    General: Skin is warm and dry.     Capillary Refill: Capillary refill takes less than 2 seconds.     Comments: Surgical site with some dehiscence no purulent discharge on exam.  No surrounding erythema or warmth.  Neurological:     Mental Status: She is alert.  Psychiatric:  Mood and Affect: Mood normal.     (all labs ordered are listed, but only abnormal results are displayed) Labs Reviewed - No data to display  EKG: None  Radiology: No results found.  {Document cardiac monitor, telemetry assessment procedure when appropriate:32947} Procedures   Medications Ordered in the ED - No data to display  Clinical Course as of 10/11/23 2355  Sun Oct 11, 2023  2347 Back wound.  Recent lipoma removal. Wound appears similar to prior.  Close follow-up with surgeon.  Lab workup to evaluate for other etiology of dizziness or infectious pathology. [CC]    Clinical Course User Index [CC] Jerral Meth, MD   {Click here for ABCD2, HEART and other calculators REFRESH Note before signing:1}                               Medical Decision Making Amount and/or Complexity of Data Reviewed Labs: ordered. Radiology: ordered.   This patient presents to the ED for concern of surgical site concern and dizziness with standing, this involves an extensive number of treatment options, and is a complaint that carries with it a high risk of complications and morbidity.  The differential diagnosis includes electrolyte abnormality, anemia, arrhythmia, orthostatic hypotension, hypoglycemia, cellulitis, wound infection, sepsis   Co morbidities / Chronic conditions that complicate the patient evaluation  Hypertension, back pain, NSTEMI, PCOS, prediabetes   Additional history obtained:  Additional history obtained from EMR External records from outside source obtained and reviewed including general surgery notes   Lab Tests:  I Ordered, and personally interpreted labs.  The pertinent results include: CBC unremarkable, BMP unremarkable, elevated D-dimer at 2.36   Imaging Studies ordered:  I ordered imaging studies including CTA PE I independently visualized and interpreted imaging which showed *** I agree with the radiologist interpretation   Cardiac Monitoring: / EKG:  The patient was maintained on a cardiac monitor.  I personally viewed and interpreted the cardiac monitored which showed an underlying rhythm of: Sinus rhythm, RBBB   Problem List / ED Course / Critical interventions / Medication management I ordered medication including ***   Reevaluation of the patient after these medicines showed that the patient *** I have reviewed the patients home medicines and have made adjustments as needed Patient orthostatic negative   Consultations Obtained:  I requested consultation with the ***,  and discussed lab and imaging findings as well as pertinent plan - they recommend: ***   Test / Admission - Considered:  ***   {Document critical care time when appropriate  Document review of labs and clinical  decision tools ie CHADS2VASC2, etc  Document your independent review of radiology images and any outside records  Document your discussion with family members, caretakers and with consultants  Document social determinants of health affecting pt's care  Document your decision making why or why not admission, treatments were needed:32947:::1}   Final diagnoses:  None    ED Discharge Orders     None

## 2023-10-12 ENCOUNTER — Emergency Department (HOSPITAL_COMMUNITY)

## 2023-10-12 MED ORDER — MECLIZINE HCL 25 MG PO TABS
50.0000 mg | ORAL_TABLET | Freq: Once | ORAL | Status: DC
  Filled 2023-10-12: qty 2

## 2023-10-12 MED ORDER — MECLIZINE HCL 50 MG PO TABS: 25.0000 mg | ORAL_TABLET | Freq: Three times a day (TID) | ORAL | 0 refills | Status: DC | PRN

## 2023-10-12 MED ORDER — IOHEXOL 350 MG/ML SOLN
75.0000 mL | Freq: Once | INTRAVENOUS | Status: AC | PRN
  Administered 2023-10-12: 75 mL via INTRAVENOUS

## 2023-10-12 NOTE — ED Notes (Signed)
 Pt denies any dizziness with stand for orthostatic vitals.

## 2023-10-12 NOTE — Discharge Instructions (Addendum)
 Today you were seen for postop incision concern and dizziness.  I suspect you likely have BPPV and have prescribed you meclizine .  Please follow-up with your primary care if your dizziness persists.  Please follow-up with your surgeon for further evaluation and treatment of your incision site.  Thank you for letting us  treat you today. After reviewing your labs and imaging, I feel you are safe to go home. Please follow up with your PCP in the next several days and provide them with your records from this visit. Return to the Emergency Room if pain becomes severe or symptoms worsen.

## 2023-11-17 ENCOUNTER — Ambulatory Visit (INDEPENDENT_AMBULATORY_CARE_PROVIDER_SITE_OTHER): Admitting: Internal Medicine

## 2023-11-17 ENCOUNTER — Other Ambulatory Visit (INDEPENDENT_AMBULATORY_CARE_PROVIDER_SITE_OTHER): Payer: Self-pay | Admitting: Internal Medicine

## 2023-11-17 ENCOUNTER — Encounter (INDEPENDENT_AMBULATORY_CARE_PROVIDER_SITE_OTHER): Payer: Self-pay | Admitting: Internal Medicine

## 2023-11-17 ENCOUNTER — Encounter: Payer: Self-pay | Admitting: Family Medicine

## 2023-11-17 VITALS — BP 125/77 | HR 70 | Temp 97.8°F | Ht 66.0 in | Wt 364.0 lb

## 2023-11-17 DIAGNOSIS — Z903 Acquired absence of stomach [part of]: Secondary | ICD-10-CM

## 2023-11-17 DIAGNOSIS — R7303 Prediabetes: Secondary | ICD-10-CM

## 2023-11-17 DIAGNOSIS — Z6841 Body Mass Index (BMI) 40.0 and over, adult: Secondary | ICD-10-CM

## 2023-11-17 DIAGNOSIS — G4733 Obstructive sleep apnea (adult) (pediatric): Secondary | ICD-10-CM

## 2023-11-17 DIAGNOSIS — I214 Non-ST elevation (NSTEMI) myocardial infarction: Secondary | ICD-10-CM

## 2023-11-17 MED ORDER — METFORMIN HCL ER 500 MG PO TB24: 1000.0000 mg | ORAL_TABLET | Freq: Two times a day (BID) | ORAL | 0 refills | Status: DC

## 2023-11-17 MED ORDER — PHENTERMINE-TOPIRAMATE ER 7.5-46 MG PO CP24: 1.0000 | ORAL_CAPSULE | Freq: Every day | ORAL | 0 refills | Status: DC

## 2023-11-17 MED ORDER — PHENTERMINE-TOPIRAMATE ER 3.75-23 MG PO CP24: 1.0000 | ORAL_CAPSULE | Freq: Every day | ORAL | 0 refills | Status: DC

## 2023-11-17 MED ORDER — SERTRALINE HCL 100 MG PO TABS: 100.0000 mg | ORAL_TABLET | Freq: Every day | ORAL | 3 refills | Status: AC

## 2023-11-17 MED ORDER — ALPRAZOLAM 0.5 MG PO TABS: 0.5000 mg | ORAL_TABLET | Freq: Every day | ORAL | 0 refills | Status: DC | PRN

## 2023-11-17 NOTE — Assessment & Plan Note (Signed)
 Patient advised to reduce metformin  to 500 mg twice a day as she will be starting Qsymia and this may increase her risk of metabolic acidosis.  We had initially increased it to 1000 mg twice a day.

## 2023-11-17 NOTE — Assessment & Plan Note (Signed)
 Secondary to sepsis she does not have obstructive CAD.  GLP-1 treatment is cost prohibitive.

## 2023-11-17 NOTE — Assessment & Plan Note (Signed)
 Obesity in context of previous gastric sleeve surgery Obesity management complicated by insurance coverage issues for GLP-1 medications like Wegovy  and [name] . Consideration of Qsymia due to cost prohibitive nature of GLP-1 treatments. Potential revision surgery to Roux-en-Y gastric bypass if pharmacotherapy is insufficient.  - Follow up on prior authorizations for Wegovy  and [name] . - Consider starting Qsymia if GLP-1 medications are not approved. - Provide information on Qsymia, including benefits and side effects. - Discuss potential revision surgery to Roux-en-Y gastric bypass if pharmacotherapy is insufficient. - Provide a 1400 calorie bariatric meal plan with high protein snacks. - Encourage regular exercise, focusing on chair exercises and upper body workouts due to joint pain.

## 2023-11-17 NOTE — Assessment & Plan Note (Signed)
 She has moderate sleep apnea and uses a CPAP machine for 5-6 hours per night, improving sleep quality. - Continue using CPAP machine for at least 5-6 hours per night.  - GLP-1 treatment is cost prohibitive she will be started on Qsymia along with nutritional behavioral strategies for weight management.

## 2023-11-17 NOTE — Progress Notes (Signed)
 Office: [phone]  /  Fax: [phone]  Weight Summary and Body Composition Analysis (BIA)  Vitals Temp: 97.8 F (36.6 C) BP: 125/77 Pulse Rate: 70 SpO2: 97 %   Anthropometric Measurements Height: 5' 6 (1.676 m) Weight: (!) 364 lb (165.1 kg) BMI (Calculated): 58.78 Weight at Last Visit: 368 lb Weight Lost Since Last Visit: 4 lb Weight Gained Since Last Visit: 0 lb Starting Weight: 361 lb Total Weight Loss (lbs): 3 lb (1.361 kg) Peak Weight: 426 lb   Body Composition  Body Fat %: 59.3 % Fat Mass (lbs): 216 lbs Muscle Mass (lbs): 141 lbs Visceral Fat Rating : 25    RMR: 217  Today's Visit #: #7  Starting Date: 09/07/23   Subjective   Chief Complaint: Obesity  Interval History Discussed the use of AI scribe software for clinical note transcription with the patient, who gave verbal consent to proceed.  History of Present Illness Melissa Erickson is a 43 year old female who presents for medical weight management.  Since last office visit she has lost 4 pounds she is following a 1400-calorie nutrition plan 80% of the time.  She has not been exercising.  She notes difficulty consuming the recommended amount of calories due to history of gastric sleeve surgery.  We had prescribed Wegovy  but she had a history of coronary artery disease and also [name]  as she has a history of sleep apnea, despite meeting her deductible, she faces high out-of-pocket costs for medications like Wegovy  and [name] .  She has a history of severe arthritis in her feet and hips, which has made movement difficult.   She underwent gastric sleeve surgery and is currently focusing on increasing her protein intake to manage her weight. She feels more energetic and less hungry when consuming adequate protein. She is currently taking metformin , one tablet twice a day, with no reported side effects     Challenges affecting patient progress: low volume of physical activity at present ,  orthopedic problems, medical conditions or chronic pain affecting mobility, having difficulties with GLP-1 or AOM coverage, and metabolic adaptations associated with metabolic surgery.    Pharmacotherapy for weight management: She is currently taking Metformin  (off label use for weight management and / or insulin  resistance and / or diabetes prevention) with adequate clinical response  and without side effects. and GLP-1 is cost prohibitive.   Assessment and Plan   Treatment Plan For Obesity:  Recommended Dietary Goals  Melissa Erickson is currently in the action stage of change. As such, her goal is to continue weight management plan. She has agreed to: switch to provided with a 7-day, 1400 cal, high-protein meal plan designed for gastric bypass which includes 3 smaller meals with 2 protein snacks per day  Behavioral Health and Counseling  We discussed the following behavioral modification strategies today: continue to work on maintaining a reduced calorie state, getting the recommended amount of protein, incorporating whole foods, making healthy choices, staying well hydrated and practicing mindfulness when eating. and increase protein intake, fibrous foods (25 grams per day for women, 30 grams for men) and water to improve satiety and decrease hunger signals. .  Additional education and resources provided today: Handout on Qsymia  Recommended Physical Activity Goals  Melissa Erickson has been advised to work up to 150 minutes of moderate intensity aerobic activity a week and strengthening exercises 2-3 times per week for cardiovascular health, weight loss maintenance and preservation of muscle mass.  She has agreed to :  Think about enjoyable  ways to increase daily physical activity and overcoming barriers to exercise and Increase physical activity in their day and reduce sedentary time (increase NEAT).  Medical Interventions and Pharmacotherapy  We discussed various medication options to help Melissa Erickson with her  weight loss efforts and we both agreed to : Start anti-obesity medication.  In addition to reduced calorie nutrition plan (RCNP), behavioral strategies and physical activity, Melissa Erickson would benefit from pharmacotherapy to assist with hunger signals, satiety and cravings. This will reduce obesity-related health risks by inducing weight loss, and help reduce food consumption and adherence to [facility]) . It may also improve QOL by improving self-confidence and reduce the  setbacks associated with metabolic adaptations.  After discussion of treatment options, mechanisms of action, benefits, side effects, contraindications and shared decision making she is agreeable to starting Qsymia. Patient also made aware that medication is indicated for long-term management of obesity and the risk of weight regain following discontinuation of treatment and hence the importance of adhering to medical weight loss plan.    We reviewed indications, medication side effects including risk of teratogenicity.  She has a hysterectomy We reviewed policies and patient signed controlled antiobesity medicine agreement. We reviewed [place] Controlled Substance Database, patient not on controlled substances. GLP-1 treatment is cost prohibitive   Associated Conditions Impacted by Obesity Treatment  Assessment & Plan Prediabetes Patient advised to reduce metformin  to 500 mg twice a day as she will be starting Qsymia and this may increase her risk of metabolic acidosis.  We had initially increased it to 1000 mg twice a day. NSTEMI (non-ST elevated myocardial infarction), type 2 secondary to demand ischemia sue to sepsis Secondary to sepsis she does not have obstructive CAD.  GLP-1 treatment is cost prohibitive. OSA (obstructive sleep apnea) She has moderate sleep apnea and uses a CPAP machine for 5-6 hours per night, improving sleep quality. - Continue using CPAP machine for at least 5-6 hours per night.  - GLP-1 treatment is cost  prohibitive she will be started on Qsymia along with nutritional behavioral strategies for weight management. Morbid obesity with BMI of 50.0-59.9, adult (HCC) H/O gastric sleeve Obesity in context of previous gastric sleeve surgery Obesity management complicated by insurance coverage issues for GLP-1 medications like Wegovy  and [name] . Consideration of Qsymia due to cost prohibitive nature of GLP-1 treatments. Potential revision surgery to Roux-en-Y gastric bypass if pharmacotherapy is insufficient.  - Follow up on prior authorizations for Wegovy  and [name] . - Consider starting Qsymia if GLP-1 medications are not approved. - Provide information on Qsymia, including benefits and side effects. - Discuss potential revision surgery to Roux-en-Y gastric bypass if pharmacotherapy is insufficient. - Provide a 1400 calorie bariatric meal plan with high protein snacks. - Encourage regular exercise, focusing on chair exercises and upper body workouts due to joint pain.         Objective   Physical Exam:  Blood pressure 125/77, pulse 70, temperature 97.8 F (36.6 C), height 5' 6 (1.676 m), weight (!) 364 lb (165.1 kg), last menstrual period 07/22/2019, SpO2 97%. Body mass index is 58.75 kg/m.  General: She is overweight, cooperative, alert, well developed, and in no acute distress. PSYCH: Has normal mood, affect and thought process.   HEENT: EOMI, sclerae are anicteric. Lungs: Normal breathing effort, no conversational dyspnea. Extremities: No edema.  Neurologic: No gross sensory or motor deficits. No tremors or fasciculations noted.    Diagnostic Data Reviewed:  BMET    Component Value Date/Time   NA 137 10/11/2023 2313  NA 143 09/15/2023 0000   K 4.0 10/11/2023 2313   CL 102 10/11/2023 2313   CO2 24 10/11/2023 2313   GLUCOSE 95 10/11/2023 2313   BUN 10 10/11/2023 2313   BUN 16 09/15/2023 0000   CREATININE 0.80 10/11/2023 2313   CALCIUM  9.0 10/11/2023 2313   GFRNONAA >60  10/11/2023 2313   GFRAA >60 07/26/2019 0714   Lab Results  Component Value Date   HGBA1C 5.3 09/15/2023   HGBA1C 5.7 02/23/2019   Lab Results  Component Value Date   INSULIN  27.6 (H) 09/10/2022   Lab Results  Component Value Date   TSH 1.02 04/15/2023   CBC    Component Value Date/Time   WBC 7.8 10/11/2023 2313   RBC 5.08 10/11/2023 2313   HGB 13.8 10/11/2023 2313   HGB 13.3 09/10/2022 0857   HCT 44.2 10/11/2023 2313   HCT 41.9 09/10/2022 0857   PLT 219 10/11/2023 2313   PLT 200 09/10/2022 0857   MCV 87.0 10/11/2023 2313   MCV 84 09/10/2022 0857   MCH 27.2 10/11/2023 2313   MCHC 31.2 10/11/2023 2313   RDW 13.5 10/11/2023 2313   RDW 13.7 09/10/2022 0857   Iron Studies    Component Value Date/Time   IRON 52 04/15/2023 1406   TIBC 314 04/15/2023 1406   FERRITIN 22 04/15/2023 1406   IRONPCTSAT 17 04/15/2023 1406   Lipid Panel     Component Value Date/Time   CHOL 152 09/15/2023 0000   CHOL 209 (H) 09/10/2022 0857   TRIG 106 09/15/2023 0000   HDL 43 09/15/2023 0000   HDL 44 09/10/2022 0857   CHOLHDL 5 04/15/2023 1406   VLDL 23.4 04/15/2023 1406   LDLCALC 90 09/15/2023 0000   LDLCALC 143 (H) 09/10/2022 0857   Hepatic Function Panel     Component Value Date/Time   PROT 7.4 10/04/2023 2045   PROT 6.7 09/10/2022 0857   ALBUMIN 3.8 10/04/2023 2045   ALBUMIN 3.7 (L) 09/10/2022 0857   AST 21 10/04/2023 2045   ALT 22 10/04/2023 2045   ALKPHOS 73 10/04/2023 2045   BILITOT 0.4 10/04/2023 2045   BILITOT 0.3 09/10/2022 0857      Component Value Date/Time   TSH 1.02 04/15/2023 1406   Nutritional Lab Results  Component Value Date   VD25OH 28.7 09/15/2023   VD25OH 14.57 (L) 04/15/2023   VD25OH 9.0 (L) 09/10/2022    Medications: Outpatient Encounter Medications as of 11/17/2023  Medication Sig   ALPRAZolam  (XANAX ) 0.5 MG tablet Take 1 tablet (0.5 mg total) by mouth daily as needed for anxiety.   Phentermine -Topiramate ER 3.75-23 MG CP24 Take 1 capsule by  mouth daily before breakfast.   [START ON 11/30/2023] Phentermine -Topiramate ER 7.5-46 MG CP24 Take 1 capsule by mouth daily.   sertraline  (ZOLOFT ) 100 MG tablet Take 1 tablet (100 mg total) by mouth daily.   [DISCONTINUED] metFORMIN  (GLUCOPHAGE -XR) 500 MG 24 hr tablet Take 1 tablet (500 mg total) by mouth 2 (two) times daily with a meal.   metFORMIN  (GLUCOPHAGE -XR) 500 MG 24 hr tablet Take 2 tablets (1,000 mg total) by mouth 2 (two) times daily with a meal.   [DISCONTINUED] albuterol  (VENTOLIN  HFA) 108 (90 Base) MCG/ACT inhaler Inhale 2 puffs into the lungs every 4 (four) hours as needed for wheezing or shortness of breath.   [DISCONTINUED] ALPRAZolam  (XANAX ) 0.5 MG tablet Take 1 tablet (0.5 mg total) by mouth daily as needed for anxiety.   [DISCONTINUED] meclizine  (ANTIVERT ) 50 MG tablet Take  0.5 tablets (25 mg total) by mouth 3 (three) times daily as needed for dizziness.   [DISCONTINUED] oxyCODONE  (ROXICODONE ) 5 MG immediate release tablet Take 1 tablet (5 mg total) by mouth every 6 (six) hours as needed.   [DISCONTINUED] Semaglutide -Weight Management (WEGOVY ) 0.25 MG/0.5ML SOAJ Inject 0.25 mg into the skin once a week. (Patient not taking: Reported on 09/24/2023)   [DISCONTINUED] sertraline  (ZOLOFT ) 100 MG tablet Take 1 tablet (100 mg total) by mouth daily.   No facility-administered encounter medications on file as of 11/17/2023.     Follow-Up   No follow-ups on file.Melissa Erickson She was informed of the importance of frequent follow up visits to maximize her success with intensive lifestyle modifications for her multiple health conditions.  Attestation Statement   Reviewed by clinician on day of visit: allergies, medications, problem list, medical history, surgical history, family history, social history, and previous encounter notes.   I have spent 42 minutes in the care of the patient today including: 2 minutes before the visit reviewing and preparing the chart. 32 minutes face-to-face assessing  and reviewing listed medical problems as outlined in obesity care plan, providing nutritional and behavioral counseling on topics outlined in the obesity care plan, counseling regarding anti-obesity medication as outlined in obesity care plan, independently interpreting test results and goals of care, as described in assessment and plan, reviewing and discussing biometric information and progress, and ordering medications - see orders 8 minutes after the visit updating chart and documentation of encounter.   Lucas Parker, MD

## 2023-11-18 ENCOUNTER — Other Ambulatory Visit: Payer: Self-pay | Admitting: Medical Genetics

## 2023-11-18 ENCOUNTER — Telehealth (INDEPENDENT_AMBULATORY_CARE_PROVIDER_SITE_OTHER): Payer: Self-pay

## 2023-11-18 NOTE — Telephone Encounter (Signed)
 PA - Qysmia 7.5-46 mg started, pt currently taking 3.75-23 mg

## 2023-11-19 ENCOUNTER — Encounter (INDEPENDENT_AMBULATORY_CARE_PROVIDER_SITE_OTHER): Payer: Self-pay

## 2023-11-19 ENCOUNTER — Other Ambulatory Visit (HOSPITAL_COMMUNITY)
Admission: RE | Admit: 2023-11-19 | Discharge: 2023-11-19 | Disposition: A | Discharge status: Home / Self Care | Payer: Self-pay | Source: Ambulatory Visit | Attending: Medical Genetics | Admitting: Medical Genetics

## 2023-11-19 NOTE — Telephone Encounter (Signed)
 PA - Phentermine -Topiramate approved - pt notified

## 2023-11-29 LAB — GENECONNECT MOLECULAR SCREEN: Genetic Analysis Overall Interpretation: NEGATIVE

## 2023-12-22 ENCOUNTER — Encounter (INDEPENDENT_AMBULATORY_CARE_PROVIDER_SITE_OTHER): Payer: Self-pay | Admitting: Internal Medicine

## 2023-12-22 ENCOUNTER — Ambulatory Visit (INDEPENDENT_AMBULATORY_CARE_PROVIDER_SITE_OTHER): Payer: Self-pay | Admitting: Internal Medicine

## 2023-12-22 VITALS — BP 128/76 | HR 74 | Temp 97.6°F | Ht 66.0 in | Wt 359.0 lb

## 2023-12-22 DIAGNOSIS — Z903 Acquired absence of stomach [part of]: Secondary | ICD-10-CM

## 2023-12-22 DIAGNOSIS — R7303 Prediabetes: Secondary | ICD-10-CM | POA: Diagnosis not present

## 2023-12-22 DIAGNOSIS — G4733 Obstructive sleep apnea (adult) (pediatric): Secondary | ICD-10-CM | POA: Diagnosis not present

## 2023-12-22 DIAGNOSIS — Z6841 Body Mass Index (BMI) 40.0 and over, adult: Secondary | ICD-10-CM

## 2023-12-22 DIAGNOSIS — Z723 Lack of physical exercise: Secondary | ICD-10-CM | POA: Diagnosis not present

## 2023-12-22 MED ORDER — PHENTERMINE-TOPIRAMATE ER 7.5-46 MG PO CP24
1.0000 | ORAL_CAPSULE | Freq: Every day | ORAL | 0 refills | Status: AC
Start: 2023-12-22 — End: ?

## 2023-12-22 MED ORDER — METFORMIN HCL ER 500 MG PO TB24
1000.0000 mg | ORAL_TABLET | Freq: Every day | ORAL | Status: AC
Start: 2023-12-22 — End: ?

## 2023-12-22 NOTE — Progress Notes (Signed)
 "  Office: [phone]  /  Fax: [phone]  Weight Summary and Body Composition Analysis (BIA)  Vitals Temp: 97.6 F (36.4 C) BP: 128/76 Pulse Rate: 74 SpO2: 97 %   Anthropometric Measurements Height: 5' 6 (1.676 m) Weight: (!) 359 lb (162.8 kg) BMI (Calculated): 57.97 Weight at Last Visit: 364 lb Weight Lost Since Last Visit: 5 lb Weight Gained Since Last Visit: 0 lb Starting Weight: 361 lb Total Weight Loss (lbs): 2 lb (0.907 kg) Peak Weight: 426 lb   Body Composition  Body Fat %: 58.1 % Fat Mass (lbs): 208 lbs Muscle Mass (lbs): 143 lbs Visceral Fat Rating : 24    No data recorded No data recorded No data recorded  Subjective   Chief Complaint: Obesity  Interval History Discussed the use of AI scribe software for clinical note transcription with the patient, who gave verbal consent to proceed.  History of Present Illness Melissa Erickson is a 43 year old female with obesity and polycystic ovarian syndrome who presents for medical weight management.  Since last office visit she has lost 5 pounds she is following a 1200-calorie nutrition plan 65% of the time she has not been exercising but reports to be more active.  She has a history of gastric sleeve  She is currently on Qsymia  for weight management. s taken at 6:30-7:00 AM and helps during the day but wears off by late evening, leading to increased hunger if she stays up late. Qsymia , now on the second dose, has reduced her appetite and 'food noise' during the day. She experiences some dry mouth but has not been craving soda as much. She has lost four pounds since the last visit.  She is on metformin  500 mg, taking two tablets once a day in the evening, as she often forgets to take it in the morning. Her A1c was previously 6.0 and is now 5.3. She has high insulin  levels, previously recorded at 27.6. She has a history of polycystic ovarian syndrome.  Her nutrition plan includes a focus on high protein intake,  consuming protein first during meals, and snacking on fruits and vegetables. She uses a liquid protein supplement, which she discovered through her uncle who is diabetic. She ensures not to skip meals and forces herself to eat something regularly.  Physical activity is challenging due to severe arthritis in her feet and hip, which limits her mobility. No significant changes in energy levels noted.     Challenges affecting patient progress: low volume of physical activity at present  and metabolic adaptations associated with metabolic surgery.    Pharmacotherapy for weight management: She is currently taking Qsymia  with adequate clinical response  and without side effects..   Assessment and Plan   Treatment Plan For Obesity:  Recommended Dietary Goals  Melissa Erickson is currently in the action stage of change. As such, her goal is to continue weight management plan. She has agreed to: incorporate prepackaged healthy meals for convenience and continue current plan  Behavioral Health and Counseling  We discussed the following behavioral modification strategies today: continue to work on maintaining a reduced calorie state, getting the recommended amount of protein, incorporating whole foods, making healthy choices, staying well hydrated and practicing mindfulness when eating. and increase protein intake, fibrous foods (25 grams per day for women, 30 grams for men) and water to improve satiety and decrease hunger signals. .  Additional education and resources provided today: None  Recommended Physical Activity Goals  Melissa Erickson has been advised to  work up to 150 minutes of moderate intensity aerobic activity a week and strengthening exercises 2-3 times per week for cardiovascular health, weight loss maintenance and preservation of muscle mass.  She has agreed to :  Think about enjoyable ways to increase daily physical activity and overcoming barriers to exercise, Increase physical activity in their day  and reduce sedentary time (increase NEAT)., Increase volume of physical activity to a goal of 240 minutes a week, and Combine aerobic and strengthening exercises for efficiency and improved cardiometabolic health.  Medical Interventions and Pharmacotherapy  We discussed various medication options to help Melissa Erickson with her weight loss efforts and we both agreed to : Adequate clinical response to anti-obesity medication, continue current regimen  Associated Conditions Impacted by Obesity Treatment  Assessment & Plan Prediabetes Managed with metformin . A1c improved from 6.0 to 5.3, indicating better glycemic control. Metformin  likely contributed to this improvement. Insulin  levels were high at 27.6, but A1c improvement suggests possible reduction in insulin  levels. Metformin  also supports weight loss and manages insulin  resistance. - Continue metformin  500 mg, two tablets once daily in the evening - Scheduled follow-up blood work to monitor insulin  levels and A1c OSA (obstructive sleep apnea) She has moderate sleep apnea and uses a CPAP machine for 5-6 hours per night, improving sleep quality. - Continue using CPAP  - GLP-1 treatment is cost prohibitive - Losing 15 % of body weight may reduce AHI Morbid obesity with BMI of 50.0-59.9, adult (HCC) H/O gastric sleeve Morbid obesity managed with phentermine  and Qsymia . She has lost 5 pounds since starting Qsymia , indicating a positive response. Reports increased hunger in the evenings, likely due to phentermine  wearing off. No significant impact on energy levels noted. Insurance covers Qsymia  at a reasonable cost. Discussed potential side effects of phentermine , including dry mouth and constipation, and topiramate 's effect on taste. - Continue Qsymia  at current dose for three months - Encouraged hydration to manage dry mouth - Provided handout on holiday weight management tips Physically inactive osteoarthritis in feet and hip limits physical activity.  Discussed the importance of exercise in managing arthritis and overall health. Recommended chair exercises and aquatic programs as alternatives to walking. Emphasized the benefits of exercise in reducing inflammation and improving muscle function. - Referred to [facility] prep program for exercise and nutritional education - Encouraged participation in aquatic programs to reduce joint stress          Objective   Physical Exam:  Blood pressure 128/76, pulse 74, temperature 97.6 F (36.4 C), height 5' 6 (1.676 m), weight (!) 359 lb (162.8 kg), last menstrual period 07/22/2019, SpO2 97%. Body mass index is 57.94 kg/m.  General: She is overweight, cooperative, alert, well developed, and in no acute distress. PSYCH: Has normal mood, affect and thought process.   HEENT: EOMI, sclerae are anicteric. Lungs: Normal breathing effort, no conversational dyspnea. Extremities: No edema.  Neurologic: No gross sensory or motor deficits. No tremors or fasciculations noted.    Diagnostic Data Reviewed:  BMET    Component Value Date/Time   NA 137 10/11/2023 2313   NA 143 09/15/2023 0000   K 4.0 10/11/2023 2313   CL 102 10/11/2023 2313   CO2 24 10/11/2023 2313   GLUCOSE 95 10/11/2023 2313   BUN 10 10/11/2023 2313   BUN 16 09/15/2023 0000   CREATININE 0.80 10/11/2023 2313   CALCIUM  9.0 10/11/2023 2313   GFRNONAA >60 10/11/2023 2313   GFRAA >60 07/26/2019 0714   Lab Results  Component Value Date  HGBA1C 5.3 09/15/2023   HGBA1C 5.7 02/23/2019   Lab Results  Component Value Date   INSULIN  27.6 (H) 09/10/2022   Lab Results  Component Value Date   TSH 1.02 04/15/2023   CBC    Component Value Date/Time   WBC 7.8 10/11/2023 2313   RBC 5.08 10/11/2023 2313   HGB 13.8 10/11/2023 2313   HGB 13.3 09/10/2022 0857   HCT 44.2 10/11/2023 2313   HCT 41.9 09/10/2022 0857   PLT 219 10/11/2023 2313   PLT 200 09/10/2022 0857   MCV 87.0 10/11/2023 2313   MCV 84 09/10/2022 0857   MCH 27.2  10/11/2023 2313   MCHC 31.2 10/11/2023 2313   RDW 13.5 10/11/2023 2313   RDW 13.7 09/10/2022 0857   Iron Studies    Component Value Date/Time   IRON 52 04/15/2023 1406   TIBC 314 04/15/2023 1406   FERRITIN 22 04/15/2023 1406   IRONPCTSAT 17 04/15/2023 1406   Lipid Panel     Component Value Date/Time   CHOL 152 09/15/2023 0000   CHOL 209 (H) 09/10/2022 0857   TRIG 106 09/15/2023 0000   HDL 43 09/15/2023 0000   HDL 44 09/10/2022 0857   CHOLHDL 5 04/15/2023 1406   VLDL 23.4 04/15/2023 1406   LDLCALC 90 09/15/2023 0000   LDLCALC 143 (H) 09/10/2022 0857   Hepatic Function Panel     Component Value Date/Time   PROT 7.4 10/04/2023 2045   PROT 6.7 09/10/2022 0857   ALBUMIN 3.8 10/04/2023 2045   ALBUMIN 3.7 (L) 09/10/2022 0857   AST 21 10/04/2023 2045   ALT 22 10/04/2023 2045   ALKPHOS 73 10/04/2023 2045   BILITOT 0.4 10/04/2023 2045   BILITOT 0.3 09/10/2022 0857      Component Value Date/Time   TSH 1.02 04/15/2023 1406   Nutritional Lab Results  Component Value Date   VD25OH 28.7 09/15/2023   VD25OH 14.57 (L) 04/15/2023   VD25OH 9.0 (L) 09/10/2022    Medications: Outpatient Encounter Medications as of 12/22/2023  Medication Sig   ALPRAZolam  (XANAX ) 0.5 MG tablet Take 1 tablet (0.5 mg total) by mouth daily as needed for anxiety.   metFORMIN  (GLUCOPHAGE -XR) 500 MG 24 hr tablet Take 2 tablets (1,000 mg total) by mouth daily with breakfast.   Phentermine -Topiramate  ER 7.5-46 MG CP24 Take 1 capsule by mouth daily.   sertraline  (ZOLOFT ) 100 MG tablet Take 1 tablet (100 mg total) by mouth daily.   [DISCONTINUED] metFORMIN  (GLUCOPHAGE -XR) 500 MG 24 hr tablet Take 2 tablets (1,000 mg total) by mouth 2 (two) times daily with a meal.   [DISCONTINUED] Phentermine -Topiramate  ER 3.75-23 MG CP24 Take 1 capsule by mouth daily before breakfast.   [DISCONTINUED] Phentermine -Topiramate  ER 7.5-46 MG CP24 Take 1 capsule by mouth daily.   No facility-administered encounter medications  on file as of 12/22/2023.     Follow-Up   Return in about 4 weeks (around 01/19/2024) for For Weight Mangement with Dr. Francyne.SABRA She was informed of the importance of frequent follow up visits to maximize her success with intensive lifestyle modifications for her multiple health conditions.  Attestation Statement   Reviewed by clinician on day of visit: allergies, medications, problem list, medical history, surgical history, family history, social history, and previous encounter notes.     Lucas Francyne, MD  "

## 2023-12-23 DIAGNOSIS — Z723 Lack of physical exercise: Secondary | ICD-10-CM | POA: Insufficient documentation

## 2023-12-23 NOTE — Assessment & Plan Note (Signed)
 osteoarthritis in feet and hip limits physical activity. Discussed the importance of exercise in managing arthritis and overall health. Recommended chair exercises and aquatic programs as alternatives to walking. Emphasized the benefits of exercise in reducing inflammation and improving muscle function. - Referred to [facility] prep program for exercise and nutritional education - Encouraged participation in aquatic programs to reduce joint stress

## 2023-12-23 NOTE — Assessment & Plan Note (Signed)
 Morbid obesity managed with phentermine  and Qsymia. She has lost 5 pounds since starting Qsymia, indicating a positive response. Reports increased hunger in the evenings, likely due to phentermine  wearing off. No significant impact on energy levels noted. Insurance covers Qsymia at a reasonable cost. Discussed potential side effects of phentermine , including dry mouth and constipation, and topiramate's effect on taste. - Continue Qsymia at current dose for three months - Encouraged hydration to manage dry mouth - Provided handout on holiday weight management tips

## 2023-12-23 NOTE — Assessment & Plan Note (Signed)
 Managed with metformin . A1c improved from 6.0 to 5.3, indicating better glycemic control. Metformin  likely contributed to this improvement. Insulin  levels were high at 27.6, but A1c improvement suggests possible reduction in insulin  levels. Metformin  also supports weight loss and manages insulin  resistance. - Continue metformin  500 mg, two tablets once daily in the evening - Scheduled follow-up blood work to monitor insulin  levels and A1c

## 2023-12-23 NOTE — Assessment & Plan Note (Signed)
 She has moderate sleep apnea and uses a CPAP machine for 5-6 hours per night, improving sleep quality. - Continue using CPAP  - GLP-1 treatment is cost prohibitive - Losing 15 % of body weight may reduce AHI

## 2023-12-29 ENCOUNTER — Telehealth: Payer: Self-pay

## 2023-12-29 NOTE — Telephone Encounter (Signed)
 Called left a voicemail and asked to call back.

## 2024-01-11 ENCOUNTER — Telehealth: Payer: Self-pay

## 2024-01-11 NOTE — Telephone Encounter (Signed)
 Called re: PREP program referral; spoke with her, but she was unable to talk, will return my call.

## 2024-01-28 ENCOUNTER — Ambulatory Visit (INDEPENDENT_AMBULATORY_CARE_PROVIDER_SITE_OTHER): Admitting: Internal Medicine

## 2024-01-28 ENCOUNTER — Encounter (INDEPENDENT_AMBULATORY_CARE_PROVIDER_SITE_OTHER): Payer: Self-pay | Admitting: Internal Medicine

## 2024-01-28 DIAGNOSIS — R7303 Prediabetes: Secondary | ICD-10-CM | POA: Diagnosis not present

## 2024-01-28 DIAGNOSIS — Z6841 Body Mass Index (BMI) 40.0 and over, adult: Secondary | ICD-10-CM

## 2024-01-28 DIAGNOSIS — G4733 Obstructive sleep apnea (adult) (pediatric): Secondary | ICD-10-CM

## 2024-01-28 DIAGNOSIS — Z903 Acquired absence of stomach [part of]: Secondary | ICD-10-CM

## 2024-01-28 MED ORDER — PHENTERMINE-TOPIRAMATE ER 11.25-69 MG PO CP24: 1.0000 | ORAL_CAPSULE | Freq: Every day | ORAL | 0 refills | Status: AC

## 2024-01-28 MED ORDER — METFORMIN HCL ER 500 MG PO TB24: 1000.0000 mg | ORAL_TABLET | Freq: Every day | ORAL | 0 refills | Status: AC

## 2024-01-28 NOTE — Assessment & Plan Note (Signed)
 She has moderate sleep apnea and uses a CPAP machine for 5-6 hours per night, improving sleep quality. - Continue using CPAP  - GLP-1 treatment is cost prohibitive - Losing 15 % of body weight may reduce AHI

## 2024-01-28 NOTE — Assessment & Plan Note (Signed)
 Morbid obesity with history of gastric sleeve.  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  She has been working on meal frequency following a 1500-calorie target.  Has increased her daily steps but has not started to exercise.  She has been referred to the prep program through the [facility]. Currently on Qsymia  with appetite reduction. No undesirable side effects reported. Increased physical activity noted. Discussed importance of muscle mass building through exercise to enhance calorie burning. Consideration of GLP-1 agonists and potential gastric bypass revision discussed.  - Increased Qsymia  to 11.25 mg daily, if no clinical response at this dose she is a nonresponder medication will be discontinued - Continue metformin  for diabetes prevention - Encouraged participation in the PREP program or [facility] for structured exercise support. - Discussed potential use of GLP-1 agonists, considering cost and long-term commitment. - Discussed gastric bypass revision as a surgical option, noting lifelong vitamin supplementation requirement. - Scheduled follow-up in one month.

## 2024-01-28 NOTE — Progress Notes (Signed)
 Office: [phone]  /  Fax: [phone]  Weight Summary and Body Composition Analysis (BIA)  Vitals Temp: (!) 97.5 F (36.4 C) BP: 134/80 Pulse Rate: 81 SpO2: 100 %   Anthropometric Measurements Height: 5' 6 (1.676 m) Weight: (!) 360 lb (163.3 kg) BMI (Calculated): 58.13 Weight at Last Visit: 359 lb Weight Lost Since Last Visit: 0 lb Weight Gained Since Last Visit: 1 lb Starting Weight: 361 lb Total Weight Loss (lbs): 1 lb (0.454 kg) Peak Weight: 426 lb   Body Composition  Body Fat %: 58.7 % Fat Mass (lbs): 211.4 lbs Muscle Mass (lbs): 141.2 lbs Visceral Fat Rating : 24    RMR: [id number]  Today's Visit #: 9  Starting Date: 09/07/23   Subjective   Chief Complaint: Obesity  Interval History Discussed the use of AI scribe software for clinical note transcription with the patient, who gave verbal consent to proceed.  History of Present Illness Melissa Erickson is a 43 year old female with a history of gastric sleeve surgery who presents for medical weight management.  Since last office visit she has gained 1 pound.  She is following a 1500-calorie nutrition plan 75% of the time she has increased steps but has not been exercising.  She has been taking Qsymia  for weight management, currently on a dose of 7.5 mg for two months. She experienced a weight loss of five pounds when the dose was increased, but has gained one pound since her last visit. Her weight decreased from 364 pounds to 359 pounds between September and November, but she had to wait for insurance coverage to start the medication. She noted inconsistent use of Qsymia  during the Thanksgiving break due to a disrupted routine, but still experienced a decreased appetite when taking the medication. No undesirable side effects from the medication.  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 She tries to walk outside when the weather  permits. Her daily schedule was disrupted over the break, affecting her routine.  She continues to take metformin .   She has discussed her metabolism and the challenges of weight management, noting that her issue is not overeating but rather how her body processes food. She has tried different meal plans and is aware of the importance of exercise in her weight management journey.     Challenges affecting patient progress: low volume of physical activity at present , orthopedic problems, medical conditions or chronic pain affecting mobility, inadequate response to pharmacotherapy, and metabolic adaptations associated with metabolic surgery.    Pharmacotherapy for weight management: She is currently taking Qsymia  with waning clinical response and without side effects..   Assessment and Plan   Treatment Plan For Obesity:  Recommended Dietary Goals  Hanna is currently in the action stage of change. As such, her goal is to continue weight management plan. She has agreed to: incorporate prepackaged healthy meals for convenience, incorporate 1-2 meal replacements a day for convenience , and continue current plan  Behavioral Health and Counseling  We discussed the following behavioral modification strategies today: increasing lean protein intake to established goals, decreasing simple carbohydrates , avoiding skipping meals, increasing water intake , continue to work on maintaining a reduced calorie state, getting the recommended amount of protein, incorporating whole foods, making healthy choices, staying well hydrated and practicing mindfulness when eating., and increase protein intake, fibrous foods (25 grams per day for women, 30 grams for men) and water to  improve satiety and decrease hunger signals. .  Additional education and resources provided today: Handout on traveling and holiday eating strategies  Recommended Physical Activity Goals  [name] has been advised to work up to 150 minutes of  moderate intensity aerobic activity a week and strengthening exercises 2-3 times per week for cardiovascular health, weight loss maintenance and preservation of muscle mass.  She has agreed to :  Think about enjoyable ways to increase daily physical activity and overcoming barriers to exercise and Increase physical activity in their day and reduce sedentary time (increase NEAT).  Medical Interventions and Pharmacotherapy  We discussed various medication options to help [name] with her weight loss efforts and we both agreed to : Increase Qsymia  to 11.25 mg 1 tablet daily if she does not respond to this dose medication will be discontinued.  We also discussed available GLP-1 options with patient as she does not have coverage  Associated Conditions Impacted by Obesity Treatment  Assessment & Plan OSA (obstructive sleep apnea) She has moderate sleep apnea and uses a CPAP machine for 5-6 hours per night, improving sleep quality. - Continue using CPAP  - GLP-1 treatment is cost prohibitive - Losing 15 % of body weight may reduce AHI Morbid obesity with BMI of 50.0-59.9, adult (HCC) H/O gastric sleeve Prediabetes Morbid obesity with history of gastric sleeve.  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  She has been working on meal frequency following a 1500-calorie target.  Has increased her daily steps but has not started to exercise.  She has been referred to the prep program through the [facility]. Currently on Qsymia  with appetite reduction. No undesirable side effects reported. Increased physical activity noted. Discussed importance of muscle mass building through exercise to enhance calorie burning. Consideration of GLP-1 agonists and potential gastric bypass revision discussed.  - Increased Qsymia  to 11.25 mg daily, if no clinical response at this dose she is a nonresponder medication will be discontinued - Continue metformin  for diabetes  prevention - Encouraged participation in the PREP program or [facility] for structured exercise support. - Discussed potential use of GLP-1 agonists, considering cost and long-term commitment. - Discussed gastric bypass revision as a surgical option, noting lifelong vitamin supplementation requirement. - Scheduled follow-up in one month.        Objective   Physical Exam:  Blood pressure 134/80, pulse 81, temperature (!) 97.5 F (36.4 C), height 5' 6 (1.676 m), weight (!) 360 lb (163.3 kg), last menstrual period 07/22/2019, SpO2 100%. Body mass index is 58.11 kg/m.  General: She is overweight, cooperative, alert, well developed, and in no acute distress. PSYCH: Has normal mood, affect and thought process.   HEENT: EOMI, sclerae are anicteric. Lungs: Normal breathing effort, no conversational dyspnea. Extremities: No edema.  Neurologic: No gross sensory or motor deficits. No tremors or fasciculations noted.    Diagnostic Data Reviewed:  BMET    Component Value Date/Time   NA 137 10/11/2023 2313   NA 143 09/15/2023 0000   K 4.0 10/11/2023 2313   CL 102 10/11/2023 2313   CO2 24 10/11/2023 2313   GLUCOSE 95 10/11/2023 2313   BUN 10 10/11/2023 2313   BUN 16 09/15/2023 0000   CREATININE 0.80 10/11/2023 2313   CALCIUM  9.0 10/11/2023 2313   GFRNONAA >60 10/11/2023 2313   GFRAA >60 07/26/2019 0714   Lab Results  Component Value Date   HGBA1C 5.3 09/15/2023   HGBA1C 5.7 02/23/2019   Lab  Results  Component Value Date   INSULIN  27.6 (H) 09/10/2022   Lab Results  Component Value Date   TSH 1.02 04/15/2023   CBC    Component Value Date/Time   WBC 7.8 10/11/2023 2313   RBC 5.08 10/11/2023 2313   HGB 13.8 10/11/2023 2313   HGB 13.3 09/10/2022 0857   HCT 44.2 10/11/2023 2313   HCT 41.9 09/10/2022 0857   PLT 219 10/11/2023 2313   PLT 200 09/10/2022 0857   MCV 87.0 10/11/2023 2313   MCV 84 09/10/2022 0857   MCH 27.2 10/11/2023 2313   MCHC 31.2 10/11/2023 2313   RDW  13.5 10/11/2023 2313   RDW 13.7 09/10/2022 0857   Iron Studies    Component Value Date/Time   IRON 52 04/15/2023 1406   TIBC 314 04/15/2023 1406   FERRITIN 22 04/15/2023 1406   IRONPCTSAT 17 04/15/2023 1406   Lipid Panel     Component Value Date/Time   CHOL 152 09/15/2023 0000   CHOL 209 (H) 09/10/2022 0857   TRIG 106 09/15/2023 0000   HDL 43 09/15/2023 0000   HDL 44 09/10/2022 0857   CHOLHDL 5 04/15/2023 1406   VLDL 23.4 04/15/2023 1406   LDLCALC 90 09/15/2023 0000   LDLCALC 143 (H) 09/10/2022 0857   Hepatic Function Panel     Component Value Date/Time   PROT 7.4 10/04/2023 2045   PROT 6.7 09/10/2022 0857   ALBUMIN 3.8 10/04/2023 2045   ALBUMIN 3.7 (L) 09/10/2022 0857   AST 21 10/04/2023 2045   ALT 22 10/04/2023 2045   ALKPHOS 73 10/04/2023 2045   BILITOT 0.4 10/04/2023 2045   BILITOT 0.3 09/10/2022 0857      Component Value Date/Time   TSH 1.02 04/15/2023 1406   Nutritional Lab Results  Component Value Date   VD25OH 28.7 09/15/2023   VD25OH 14.57 (L) 04/15/2023   VD25OH 9.0 (L) 09/10/2022    Medications: Outpatient Encounter Medications as of 01/28/2024  Medication Sig   ALPRAZolam  (XANAX ) 0.5 MG tablet Take 1 tablet (0.5 mg total) by mouth daily as needed for anxiety.   Phentermine -Topiramate  ER 11.25-69 MG CP24 Take 1 tablet by mouth daily.   sertraline  (ZOLOFT ) 100 MG tablet Take 1 tablet (100 mg total) by mouth daily.   [DISCONTINUED] metFORMIN  (GLUCOPHAGE -XR) 500 MG 24 hr tablet Take 2 tablets (1,000 mg total) by mouth daily with breakfast.   [DISCONTINUED] Phentermine -Topiramate  ER 7.5-46 MG CP24 Take 1 capsule by mouth daily.   metFORMIN  (GLUCOPHAGE -XR) 500 MG 24 hr tablet Take 2 tablets (1,000 mg total) by mouth daily with breakfast.   No facility-administered encounter medications on file as of 01/28/2024.     Follow-Up   Return in about 4 weeks (around 02/25/2024) for For Weight Mangement with Dr. Francyne.SABRA She was informed of the importance  of frequent follow up visits to maximize her success with intensive lifestyle modifications for her multiple health conditions.  Attestation Statement   Reviewed by clinician on day of visit: allergies, medications, problem list, medical history, surgical history, family history, social history, and previous encounter notes.     Lucas Francyne, MD

## 2024-02-01 ENCOUNTER — Telehealth (INDEPENDENT_AMBULATORY_CARE_PROVIDER_SITE_OTHER): Payer: Self-pay

## 2024-02-01 ENCOUNTER — Encounter: Payer: Self-pay | Admitting: Family Medicine

## 2024-02-01 NOTE — Telephone Encounter (Signed)
 Phentermine  11.2-69 mg started

## 2024-02-02 MED ORDER — ALPRAZOLAM 0.5 MG PO TABS: 0.5000 mg | ORAL_TABLET | Freq: Every day | ORAL | 0 refills | Status: AC | PRN

## 2024-02-04 ENCOUNTER — Encounter (INDEPENDENT_AMBULATORY_CARE_PROVIDER_SITE_OTHER): Payer: Self-pay

## 2024-02-04 NOTE — Telephone Encounter (Signed)
 Phentermine  denied, pt notified

## 2024-02-22 ENCOUNTER — Encounter (INDEPENDENT_AMBULATORY_CARE_PROVIDER_SITE_OTHER): Payer: Self-pay

## 2024-03-07 ENCOUNTER — Ambulatory Visit (INDEPENDENT_AMBULATORY_CARE_PROVIDER_SITE_OTHER): Admitting: Internal Medicine

## 2024-04-05 ENCOUNTER — Ambulatory Visit (INDEPENDENT_AMBULATORY_CARE_PROVIDER_SITE_OTHER): Admitting: Internal Medicine

## 2024-05-17 ENCOUNTER — Encounter: Payer: 59 | Admitting: Family Medicine

## 2024-05-25 ENCOUNTER — Encounter: Admitting: Family Medicine
